# Patient Record
Sex: Female | Born: 1935 | Race: White | Hispanic: No | Smoking: Never smoker
Health system: Southern US, Community
[De-identification: ages and names within clinical notes are randomized; demographics above are authoritative.]

## PROBLEM LIST (undated history)

## (undated) DIAGNOSIS — I872 Venous insufficiency (chronic) (peripheral): Secondary | ICD-10-CM

## (undated) DIAGNOSIS — G894 Chronic pain syndrome: Secondary | ICD-10-CM

## (undated) DIAGNOSIS — K5730 Diverticulosis of large intestine without perforation or abscess without bleeding: Secondary | ICD-10-CM

## (undated) DIAGNOSIS — Q2111 Secundum atrial septal defect: Secondary | ICD-10-CM

## (undated) DIAGNOSIS — N3281 Overactive bladder: Secondary | ICD-10-CM

## (undated) DIAGNOSIS — I2510 Atherosclerotic heart disease of native coronary artery without angina pectoris: Secondary | ICD-10-CM

## (undated) DIAGNOSIS — D649 Anemia, unspecified: Secondary | ICD-10-CM

## (undated) DIAGNOSIS — E7800 Pure hypercholesterolemia, unspecified: Secondary | ICD-10-CM

## (undated) DIAGNOSIS — M545 Low back pain, unspecified: Secondary | ICD-10-CM

## (undated) DIAGNOSIS — M1990 Unspecified osteoarthritis, unspecified site: Secondary | ICD-10-CM

## (undated) DIAGNOSIS — I4430 Unspecified atrioventricular block: Secondary | ICD-10-CM

## (undated) DIAGNOSIS — Q211 Atrial septal defect: Secondary | ICD-10-CM

## (undated) HISTORY — DX: Atherosclerotic heart disease of native coronary artery without angina pectoris: I25.10

## (undated) HISTORY — PX: SHOULDER SURGERY: SHX246

## (undated) HISTORY — DX: Secundum atrial septal defect: Q21.11

## (undated) HISTORY — PX: ASD REPAIR: SHX258

## (undated) HISTORY — DX: Low back pain, unspecified: M54.50

## (undated) HISTORY — DX: Venous insufficiency (chronic) (peripheral): I87.2

## (undated) HISTORY — DX: Low back pain: M54.5

## (undated) HISTORY — PX: CATARACT EXTRACTION: SUR2

## (undated) HISTORY — DX: Atrial septal defect: Q21.1

## (undated) HISTORY — DX: Pure hypercholesterolemia, unspecified: E78.00

## (undated) HISTORY — PX: GANGLION CYST EXCISION: SHX1691

## (undated) HISTORY — DX: Diverticulosis of large intestine without perforation or abscess without bleeding: K57.30

## (undated) HISTORY — DX: Unspecified osteoarthritis, unspecified site: M19.90

## (undated) HISTORY — DX: Unspecified atrioventricular block: I44.30

## (undated) HISTORY — DX: Chronic pain syndrome: G89.4

## (undated) HISTORY — PX: PARTIAL HYSTERECTOMY: SHX80

## (undated) HISTORY — DX: Overactive bladder: N32.81

## (undated) HISTORY — DX: Anemia, unspecified: D64.9

---

## 1971-11-13 HISTORY — PX: VESICOVAGINAL FISTULA CLOSURE W/ TAH: SUR271

## 1994-11-12 HISTORY — PX: PACEMAKER PLACEMENT: SHX43

## 1998-02-21 ENCOUNTER — Other Ambulatory Visit: Admission: RE | Admit: 1998-02-21 | Discharge: 1998-02-21 | Payer: Self-pay | Admitting: Obstetrics and Gynecology

## 1999-05-03 ENCOUNTER — Other Ambulatory Visit: Admission: RE | Admit: 1999-05-03 | Discharge: 1999-05-03 | Payer: Self-pay | Admitting: Obstetrics and Gynecology

## 2000-03-15 ENCOUNTER — Encounter: Payer: Self-pay | Admitting: Obstetrics and Gynecology

## 2000-03-15 ENCOUNTER — Encounter: Admission: RE | Admit: 2000-03-15 | Discharge: 2000-03-15 | Payer: Self-pay | Admitting: Obstetrics and Gynecology

## 2000-07-03 ENCOUNTER — Other Ambulatory Visit: Admission: RE | Admit: 2000-07-03 | Discharge: 2000-07-03 | Payer: Self-pay | Admitting: Obstetrics and Gynecology

## 2001-03-12 ENCOUNTER — Ambulatory Visit (HOSPITAL_COMMUNITY): Admission: RE | Admit: 2001-03-12 | Discharge: 2001-03-12 | Payer: Self-pay | Admitting: Obstetrics and Gynecology

## 2001-05-23 ENCOUNTER — Encounter: Payer: Self-pay | Admitting: Obstetrics and Gynecology

## 2001-05-23 ENCOUNTER — Encounter: Admission: RE | Admit: 2001-05-23 | Discharge: 2001-05-23 | Payer: Self-pay | Admitting: Obstetrics and Gynecology

## 2001-08-18 ENCOUNTER — Other Ambulatory Visit: Admission: RE | Admit: 2001-08-18 | Discharge: 2001-08-18 | Payer: Self-pay | Admitting: Obstetrics and Gynecology

## 2001-09-04 ENCOUNTER — Encounter: Admission: RE | Admit: 2001-09-04 | Discharge: 2001-09-04 | Payer: Self-pay | Admitting: Obstetrics and Gynecology

## 2001-09-04 ENCOUNTER — Encounter: Payer: Self-pay | Admitting: Obstetrics and Gynecology

## 2002-06-11 ENCOUNTER — Encounter: Admission: RE | Admit: 2002-06-11 | Discharge: 2002-06-11 | Payer: Self-pay | Admitting: Obstetrics and Gynecology

## 2002-06-11 ENCOUNTER — Encounter: Payer: Self-pay | Admitting: Obstetrics and Gynecology

## 2003-10-14 ENCOUNTER — Encounter: Admission: RE | Admit: 2003-10-14 | Discharge: 2003-10-14 | Payer: Self-pay | Admitting: Obstetrics and Gynecology

## 2004-01-28 ENCOUNTER — Ambulatory Visit (HOSPITAL_COMMUNITY): Admission: RE | Admit: 2004-01-28 | Discharge: 2004-01-28 | Payer: Self-pay | Admitting: Internal Medicine

## 2004-09-18 ENCOUNTER — Ambulatory Visit: Payer: Self-pay | Admitting: Pulmonary Disease

## 2004-09-25 ENCOUNTER — Ambulatory Visit: Payer: Self-pay | Admitting: Cardiology

## 2004-09-27 ENCOUNTER — Ambulatory Visit: Payer: Self-pay | Admitting: Pulmonary Disease

## 2004-10-16 ENCOUNTER — Ambulatory Visit: Payer: Self-pay | Admitting: Cardiology

## 2004-10-27 ENCOUNTER — Encounter: Admission: RE | Admit: 2004-10-27 | Discharge: 2004-10-27 | Payer: Self-pay | Admitting: Obstetrics and Gynecology

## 2004-11-07 ENCOUNTER — Ambulatory Visit: Payer: Self-pay | Admitting: Cardiology

## 2004-12-14 ENCOUNTER — Encounter
Admission: RE | Admit: 2004-12-14 | Discharge: 2004-12-14 | Payer: Self-pay | Admitting: Physical Medicine and Rehabilitation

## 2004-12-20 ENCOUNTER — Ambulatory Visit: Payer: Self-pay | Admitting: Internal Medicine

## 2004-12-29 ENCOUNTER — Ambulatory Visit: Payer: Self-pay | Admitting: Internal Medicine

## 2005-01-15 ENCOUNTER — Ambulatory Visit: Payer: Self-pay | Admitting: Cardiovascular Disease

## 2005-01-18 ENCOUNTER — Ambulatory Visit: Payer: Self-pay

## 2005-01-19 ENCOUNTER — Ambulatory Visit: Payer: Self-pay | Admitting: Pulmonary Disease

## 2005-01-22 ENCOUNTER — Ambulatory Visit: Payer: Self-pay | Admitting: Cardiology

## 2005-01-23 ENCOUNTER — Ambulatory Visit: Payer: Self-pay

## 2005-01-31 ENCOUNTER — Ambulatory Visit: Payer: Self-pay | Admitting: Cardiology

## 2005-02-14 ENCOUNTER — Ambulatory Visit: Payer: Self-pay | Admitting: *Deleted

## 2005-03-09 ENCOUNTER — Ambulatory Visit: Payer: Self-pay | Admitting: Critical Care Medicine

## 2005-03-14 ENCOUNTER — Ambulatory Visit: Payer: Self-pay | Admitting: Internal Medicine

## 2005-03-14 ENCOUNTER — Ambulatory Visit: Payer: Self-pay

## 2005-03-14 ENCOUNTER — Ambulatory Visit: Payer: Self-pay | Admitting: Cardiology

## 2005-03-23 ENCOUNTER — Ambulatory Visit: Payer: Self-pay | Admitting: Pulmonary Disease

## 2005-03-30 ENCOUNTER — Ambulatory Visit: Payer: Self-pay | Admitting: Cardiovascular Disease

## 2005-04-06 ENCOUNTER — Ambulatory Visit: Payer: Self-pay | Admitting: Internal Medicine

## 2005-04-06 ENCOUNTER — Ambulatory Visit: Payer: Self-pay | Admitting: Cardiology

## 2005-04-23 ENCOUNTER — Ambulatory Visit: Payer: Self-pay | Admitting: Cardiology

## 2005-05-07 ENCOUNTER — Ambulatory Visit: Payer: Self-pay | Admitting: Cardiology

## 2005-05-18 ENCOUNTER — Ambulatory Visit: Payer: Self-pay | Admitting: Pulmonary Disease

## 2005-05-21 ENCOUNTER — Ambulatory Visit: Payer: Self-pay | Admitting: Cardiology

## 2005-06-18 ENCOUNTER — Ambulatory Visit: Payer: Self-pay | Admitting: Cardiology

## 2005-06-22 ENCOUNTER — Ambulatory Visit: Payer: Self-pay | Admitting: Internal Medicine

## 2005-07-02 ENCOUNTER — Ambulatory Visit: Payer: Self-pay | Admitting: Cardiology

## 2005-07-12 ENCOUNTER — Ambulatory Visit: Payer: Self-pay | Admitting: Internal Medicine

## 2005-07-26 ENCOUNTER — Ambulatory Visit: Payer: Self-pay | Admitting: Cardiology

## 2005-08-09 ENCOUNTER — Ambulatory Visit: Payer: Self-pay | Admitting: Cardiology

## 2005-08-17 ENCOUNTER — Ambulatory Visit: Payer: Self-pay | Admitting: Pulmonary Disease

## 2005-08-22 ENCOUNTER — Ambulatory Visit: Payer: Self-pay | Admitting: Pulmonary Disease

## 2005-08-30 ENCOUNTER — Ambulatory Visit: Payer: Self-pay | Admitting: Cardiology

## 2005-09-26 ENCOUNTER — Ambulatory Visit: Payer: Self-pay | Admitting: Internal Medicine

## 2005-09-27 ENCOUNTER — Ambulatory Visit: Payer: Self-pay | Admitting: Internal Medicine

## 2005-10-11 ENCOUNTER — Ambulatory Visit: Payer: Self-pay | Admitting: Cardiology

## 2005-10-30 ENCOUNTER — Encounter: Admission: RE | Admit: 2005-10-30 | Discharge: 2005-10-30 | Payer: Self-pay | Admitting: Obstetrics and Gynecology

## 2005-11-01 ENCOUNTER — Ambulatory Visit: Payer: Self-pay | Admitting: Cardiology

## 2005-11-15 ENCOUNTER — Ambulatory Visit: Payer: Self-pay | Admitting: Internal Medicine

## 2005-12-07 ENCOUNTER — Ambulatory Visit: Payer: Self-pay | Admitting: Cardiology

## 2005-12-07 ENCOUNTER — Ambulatory Visit: Payer: Self-pay | Admitting: Internal Medicine

## 2005-12-18 ENCOUNTER — Ambulatory Visit: Payer: Self-pay | Admitting: Pulmonary Disease

## 2006-01-04 ENCOUNTER — Ambulatory Visit: Payer: Self-pay | Admitting: Cardiology

## 2006-01-16 ENCOUNTER — Ambulatory Visit: Payer: Self-pay | Admitting: Internal Medicine

## 2006-02-01 ENCOUNTER — Ambulatory Visit: Payer: Self-pay | Admitting: Cardiovascular Disease

## 2006-02-12 ENCOUNTER — Ambulatory Visit: Payer: Self-pay | Admitting: Cardiology

## 2006-02-19 ENCOUNTER — Ambulatory Visit: Payer: Self-pay | Admitting: Internal Medicine

## 2006-02-28 ENCOUNTER — Ambulatory Visit: Payer: Self-pay | Admitting: Internal Medicine

## 2006-03-06 ENCOUNTER — Ambulatory Visit: Payer: Self-pay | Admitting: Internal Medicine

## 2006-03-28 ENCOUNTER — Ambulatory Visit: Payer: Self-pay | Admitting: Cardiology

## 2006-04-17 ENCOUNTER — Ambulatory Visit: Payer: Self-pay | Admitting: Internal Medicine

## 2006-04-18 ENCOUNTER — Ambulatory Visit: Payer: Self-pay | Admitting: Internal Medicine

## 2006-04-18 ENCOUNTER — Ambulatory Visit: Payer: Self-pay | Admitting: Pulmonary Disease

## 2006-04-25 ENCOUNTER — Ambulatory Visit: Payer: Self-pay | Admitting: Cardiology

## 2006-05-01 ENCOUNTER — Ambulatory Visit: Payer: Self-pay | Admitting: *Deleted

## 2006-05-22 ENCOUNTER — Ambulatory Visit: Payer: Self-pay | Admitting: Internal Medicine

## 2006-06-17 ENCOUNTER — Ambulatory Visit: Payer: Self-pay | Admitting: Internal Medicine

## 2006-07-01 ENCOUNTER — Ambulatory Visit: Payer: Self-pay | Admitting: Internal Medicine

## 2006-07-17 ENCOUNTER — Ambulatory Visit: Payer: Self-pay | Admitting: *Deleted

## 2006-07-17 ENCOUNTER — Ambulatory Visit: Payer: Self-pay | Admitting: Internal Medicine

## 2006-08-08 ENCOUNTER — Ambulatory Visit: Payer: Self-pay | Admitting: Cardiology

## 2006-09-05 ENCOUNTER — Ambulatory Visit: Payer: Self-pay | Admitting: Cardiology

## 2006-09-18 ENCOUNTER — Ambulatory Visit: Payer: Self-pay | Admitting: Pulmonary Disease

## 2006-10-07 ENCOUNTER — Ambulatory Visit: Payer: Self-pay | Admitting: Cardiology

## 2006-10-17 ENCOUNTER — Ambulatory Visit: Payer: Self-pay | Admitting: Internal Medicine

## 2006-10-22 ENCOUNTER — Ambulatory Visit: Payer: Self-pay | Admitting: Internal Medicine

## 2006-11-06 ENCOUNTER — Ambulatory Visit: Payer: Self-pay | Admitting: Cardiology

## 2006-11-11 ENCOUNTER — Ambulatory Visit: Payer: Self-pay | Admitting: Pulmonary Disease

## 2006-11-11 ENCOUNTER — Ambulatory Visit: Payer: Self-pay | Admitting: Cardiovascular Disease

## 2006-11-18 ENCOUNTER — Ambulatory Visit: Payer: Self-pay | Admitting: Cardiology

## 2006-11-18 ENCOUNTER — Ambulatory Visit: Payer: Self-pay | Admitting: Internal Medicine

## 2006-12-02 ENCOUNTER — Ambulatory Visit: Payer: Self-pay | Admitting: Cardiology

## 2006-12-12 ENCOUNTER — Ambulatory Visit: Payer: Self-pay | Admitting: Internal Medicine

## 2006-12-16 ENCOUNTER — Ambulatory Visit: Payer: Self-pay | Admitting: Cardiology

## 2006-12-27 ENCOUNTER — Encounter: Admission: RE | Admit: 2006-12-27 | Discharge: 2006-12-27 | Payer: Self-pay | Admitting: Obstetrics and Gynecology

## 2006-12-30 ENCOUNTER — Ambulatory Visit: Payer: Self-pay | Admitting: Internal Medicine

## 2006-12-30 LAB — CONVERTED CEMR LAB
BUN: 21 mg/dL (ref 6–23)
Basophils Absolute: 0 10*3/uL (ref 0.0–0.1)
Basophils Relative: 0.3 % (ref 0.0–1.0)
CO2: 29 meq/L (ref 19–32)
Chloride: 102 meq/L (ref 96–112)
Creatinine, Ser: 1.1 mg/dL (ref 0.4–1.2)
HCT: 35 % — ABNORMAL LOW (ref 36.0–46.0)
Hemoglobin: 11.5 g/dL — ABNORMAL LOW (ref 12.0–15.0)
INR: 1.6 (ref 0.9–2.0)
MCHC: 33 g/dL (ref 30.0–36.0)
Monocytes Absolute: 0.6 10*3/uL (ref 0.2–0.7)
Monocytes Relative: 11.1 % — ABNORMAL HIGH (ref 3.0–11.0)
Neutrophils Relative %: 61.4 % (ref 43.0–77.0)
Potassium: 4.2 meq/L (ref 3.5–5.1)
Prothrombin Time: 16 s — ABNORMAL HIGH (ref 10.0–14.0)
RBC: 3.46 M/uL — ABNORMAL LOW (ref 3.87–5.11)
RDW: 12.3 % (ref 11.5–14.6)
aPTT: 34.2 s (ref 26.5–36.5)

## 2007-01-06 ENCOUNTER — Ambulatory Visit: Payer: Self-pay | Admitting: Internal Medicine

## 2007-01-06 ENCOUNTER — Inpatient Hospital Stay (HOSPITAL_COMMUNITY): Admission: AD | Admit: 2007-01-06 | Discharge: 2007-01-07 | Payer: Self-pay | Admitting: Internal Medicine

## 2007-01-13 ENCOUNTER — Ambulatory Visit: Payer: Self-pay

## 2007-01-13 ENCOUNTER — Ambulatory Visit: Payer: Self-pay | Admitting: Cardiovascular Disease

## 2007-01-23 ENCOUNTER — Ambulatory Visit: Payer: Self-pay | Admitting: Cardiology

## 2007-01-30 ENCOUNTER — Ambulatory Visit: Payer: Self-pay

## 2007-02-06 ENCOUNTER — Ambulatory Visit: Payer: Self-pay | Admitting: Cardiology

## 2007-02-13 ENCOUNTER — Ambulatory Visit: Payer: Self-pay | Admitting: Cardiology

## 2007-02-27 ENCOUNTER — Ambulatory Visit: Payer: Self-pay | Admitting: Internal Medicine

## 2007-03-13 ENCOUNTER — Ambulatory Visit: Payer: Self-pay | Admitting: Cardiovascular Disease

## 2007-03-27 ENCOUNTER — Ambulatory Visit: Payer: Self-pay | Admitting: Internal Medicine

## 2007-03-31 ENCOUNTER — Ambulatory Visit: Payer: Self-pay | Admitting: Pulmonary Disease

## 2007-04-01 ENCOUNTER — Ambulatory Visit: Payer: Self-pay | Admitting: Pulmonary Disease

## 2007-04-01 LAB — CONVERTED CEMR LAB
Bilirubin, Direct: 0.1 mg/dL (ref 0.0–0.3)
CO2: 27 meq/L (ref 19–32)
Cholesterol: 91 mg/dL (ref 0–200)
GFR calc Af Amer: 68 mL/min
GFR calc non Af Amer: 56 mL/min
Glucose, Bld: 84 mg/dL (ref 70–99)
LDL Cholesterol: 41 mg/dL (ref 0–99)
Potassium: 4.1 meq/L (ref 3.5–5.1)
Sodium: 141 meq/L (ref 135–145)
Total CHOL/HDL Ratio: 2.2
Total Protein: 6.4 g/dL (ref 6.0–8.3)
Triglycerides: 47 mg/dL (ref 0–149)

## 2007-04-03 ENCOUNTER — Ambulatory Visit: Payer: Self-pay | Admitting: Internal Medicine

## 2007-04-17 ENCOUNTER — Ambulatory Visit: Payer: Self-pay | Admitting: Cardiovascular Disease

## 2007-04-28 ENCOUNTER — Ambulatory Visit: Payer: Self-pay | Admitting: Pulmonary Disease

## 2007-05-02 ENCOUNTER — Ambulatory Visit: Payer: Self-pay | Admitting: Cardiovascular Disease

## 2007-05-08 ENCOUNTER — Ambulatory Visit: Payer: Self-pay | Admitting: Internal Medicine

## 2007-05-23 ENCOUNTER — Ambulatory Visit: Payer: Self-pay | Admitting: Cardiology

## 2007-06-05 ENCOUNTER — Ambulatory Visit: Payer: Self-pay | Admitting: Internal Medicine

## 2007-06-13 ENCOUNTER — Ambulatory Visit: Payer: Self-pay | Admitting: Internal Medicine

## 2007-06-17 ENCOUNTER — Ambulatory Visit: Payer: Self-pay | Admitting: Internal Medicine

## 2007-07-03 ENCOUNTER — Ambulatory Visit: Payer: Self-pay | Admitting: Internal Medicine

## 2007-07-11 ENCOUNTER — Ambulatory Visit: Payer: Self-pay | Admitting: Internal Medicine

## 2007-07-31 ENCOUNTER — Ambulatory Visit: Payer: Self-pay | Admitting: Internal Medicine

## 2007-08-08 ENCOUNTER — Ambulatory Visit: Payer: Self-pay | Admitting: Cardiovascular Disease

## 2007-08-25 ENCOUNTER — Encounter: Admission: RE | Admit: 2007-08-25 | Discharge: 2007-08-25 | Payer: Self-pay | Admitting: Orthopedic Surgery

## 2007-08-26 ENCOUNTER — Encounter (INDEPENDENT_AMBULATORY_CARE_PROVIDER_SITE_OTHER): Payer: Self-pay | Admitting: Orthopedic Surgery

## 2007-08-26 ENCOUNTER — Ambulatory Visit (HOSPITAL_BASED_OUTPATIENT_CLINIC_OR_DEPARTMENT_OTHER): Admission: RE | Admit: 2007-08-26 | Discharge: 2007-08-26 | Payer: Self-pay | Admitting: Orthopedic Surgery

## 2007-08-27 ENCOUNTER — Ambulatory Visit: Payer: Self-pay | Admitting: Internal Medicine

## 2007-09-04 ENCOUNTER — Ambulatory Visit: Payer: Self-pay | Admitting: Pulmonary Disease

## 2007-09-04 DIAGNOSIS — M545 Low back pain: Secondary | ICD-10-CM

## 2007-09-04 DIAGNOSIS — E7800 Pure hypercholesterolemia, unspecified: Secondary | ICD-10-CM | POA: Insufficient documentation

## 2007-09-04 DIAGNOSIS — J989 Respiratory disorder, unspecified: Secondary | ICD-10-CM | POA: Insufficient documentation

## 2007-09-04 DIAGNOSIS — I872 Venous insufficiency (chronic) (peripheral): Secondary | ICD-10-CM | POA: Insufficient documentation

## 2007-09-04 LAB — CONVERTED CEMR LAB
Cholesterol: 139 mg/dL (ref 0–200)
HDL: 57.6 mg/dL (ref >39.0)
Total CHOL/HDL Ratio: 2.4
Triglycerides: 58 mg/dL (ref 0–149)

## 2007-09-05 ENCOUNTER — Ambulatory Visit: Payer: Self-pay | Admitting: Internal Medicine

## 2007-09-05 ENCOUNTER — Ambulatory Visit: Payer: Self-pay | Admitting: Cardiovascular Disease

## 2007-09-11 ENCOUNTER — Ambulatory Visit: Payer: Self-pay | Admitting: Internal Medicine

## 2007-09-19 ENCOUNTER — Ambulatory Visit: Payer: Self-pay | Admitting: Internal Medicine

## 2007-09-19 ENCOUNTER — Inpatient Hospital Stay (HOSPITAL_COMMUNITY): Admission: AD | Admit: 2007-09-19 | Discharge: 2007-09-20 | Payer: Self-pay | Admitting: Internal Medicine

## 2007-09-24 ENCOUNTER — Ambulatory Visit: Payer: Self-pay | Admitting: Cardiology

## 2007-09-24 ENCOUNTER — Ambulatory Visit: Payer: Self-pay

## 2007-10-01 ENCOUNTER — Ambulatory Visit: Payer: Self-pay

## 2007-10-06 ENCOUNTER — Ambulatory Visit: Payer: Self-pay | Admitting: Cardiology

## 2007-10-23 ENCOUNTER — Ambulatory Visit: Payer: Self-pay | Admitting: Internal Medicine

## 2007-10-23 ENCOUNTER — Ambulatory Visit: Payer: Self-pay | Admitting: Cardiology

## 2007-10-23 LAB — CONVERTED CEMR LAB
Basophils Absolute: 0 10*3/uL (ref 0.0–0.1)
Eosinophils Absolute: 0.3 10*3/uL (ref 0.0–0.6)
Eosinophils Relative: 3.9 % (ref 0.0–5.0)
Lymphocytes Relative: 25.7 % (ref 12.0–46.0)
MCHC: 33.8 g/dL (ref 30.0–36.0)
MCV: 104.2 fL — ABNORMAL HIGH (ref 78.0–100.0)
Monocytes Relative: 4.6 % (ref 3.0–11.0)
Neutro Abs: 4.6 10*3/uL (ref 1.4–7.7)
Platelets: 208 10*3/uL (ref 150–400)
TSH: 0.39 microintl units/mL (ref 0.35–5.50)
WBC: 7 10*3/uL (ref 4.5–10.5)

## 2007-11-10 ENCOUNTER — Ambulatory Visit: Payer: Self-pay | Admitting: Cardiology

## 2007-11-20 ENCOUNTER — Ambulatory Visit: Payer: Self-pay | Admitting: Internal Medicine

## 2007-11-24 ENCOUNTER — Ambulatory Visit: Payer: Self-pay | Admitting: Cardiology

## 2007-12-15 ENCOUNTER — Ambulatory Visit: Payer: Self-pay | Admitting: Internal Medicine

## 2008-01-14 ENCOUNTER — Encounter: Admission: RE | Admit: 2008-01-14 | Discharge: 2008-01-14 | Payer: Self-pay | Admitting: Obstetrics and Gynecology

## 2008-01-14 ENCOUNTER — Ambulatory Visit: Payer: Self-pay | Admitting: Cardiology

## 2008-02-11 ENCOUNTER — Ambulatory Visit: Payer: Self-pay | Admitting: Internal Medicine

## 2008-02-25 ENCOUNTER — Ambulatory Visit: Payer: Self-pay | Admitting: Internal Medicine

## 2008-03-03 ENCOUNTER — Ambulatory Visit: Payer: Self-pay | Admitting: Cardiology

## 2008-03-31 ENCOUNTER — Ambulatory Visit: Payer: Self-pay | Admitting: Cardiology

## 2008-04-13 ENCOUNTER — Ambulatory Visit: Payer: Self-pay | Admitting: Internal Medicine

## 2008-04-21 ENCOUNTER — Ambulatory Visit: Payer: Self-pay | Admitting: Cardiology

## 2008-05-21 ENCOUNTER — Ambulatory Visit: Payer: Self-pay | Admitting: Cardiovascular Disease

## 2008-06-18 ENCOUNTER — Ambulatory Visit: Payer: Self-pay | Admitting: Cardiology

## 2008-07-15 ENCOUNTER — Ambulatory Visit: Payer: Self-pay | Admitting: Pulmonary Disease

## 2008-07-15 ENCOUNTER — Ambulatory Visit: Payer: Self-pay | Admitting: Internal Medicine

## 2008-07-15 DIAGNOSIS — M810 Age-related osteoporosis without current pathological fracture: Secondary | ICD-10-CM

## 2008-07-15 DIAGNOSIS — M1990 Unspecified osteoarthritis, unspecified site: Secondary | ICD-10-CM | POA: Insufficient documentation

## 2008-07-16 ENCOUNTER — Encounter: Payer: Self-pay | Admitting: Pulmonary Disease

## 2008-07-16 ENCOUNTER — Ambulatory Visit: Payer: Self-pay | Admitting: Cardiovascular Disease

## 2008-07-16 ENCOUNTER — Ambulatory Visit: Payer: Self-pay

## 2008-07-17 DIAGNOSIS — K5730 Diverticulosis of large intestine without perforation or abscess without bleeding: Secondary | ICD-10-CM | POA: Insufficient documentation

## 2008-07-17 DIAGNOSIS — N318 Other neuromuscular dysfunction of bladder: Secondary | ICD-10-CM | POA: Insufficient documentation

## 2008-07-17 LAB — CONVERTED CEMR LAB
ALT: 17 units/L (ref 0–35)
AST: 58 units/L — ABNORMAL HIGH (ref 0–37)
Albumin: 4.1 g/dL (ref 3.5–5.2)
BUN: 17 mg/dL (ref 6–23)
Basophils Relative: 0.7 % (ref 0.0–3.0)
CO2: 33 meq/L — ABNORMAL HIGH (ref 19–32)
Chloride: 106 meq/L (ref 96–112)
Creatinine, Ser: 0.8 mg/dL (ref 0.4–1.2)
Eosinophils Relative: 1.7 % (ref 0.0–5.0)
Hemoglobin: 12.5 g/dL (ref 12.0–15.0)
LDL Cholesterol: 104 mg/dL — ABNORMAL HIGH (ref 0–99)
Lymphocytes Relative: 36.2 % (ref 12.0–46.0)
MCV: 100.7 fL — ABNORMAL HIGH (ref 78.0–100.0)
Neutrophils Relative %: 54 % (ref 43.0–77.0)
RBC: 3.54 M/uL — ABNORMAL LOW (ref 3.87–5.11)
TSH: 0.59 microintl units/mL (ref 0.35–5.50)
Total Protein: 6.9 g/dL (ref 6.0–8.3)
VLDL: 8 mg/dL (ref 0–40)
WBC: 4.9 10*3/uL (ref 4.5–10.5)

## 2008-08-13 ENCOUNTER — Ambulatory Visit: Payer: Self-pay | Admitting: Cardiovascular Disease

## 2008-08-18 ENCOUNTER — Ambulatory Visit: Payer: Self-pay | Admitting: Pulmonary Disease

## 2008-09-10 ENCOUNTER — Ambulatory Visit: Payer: Self-pay | Admitting: Internal Medicine

## 2008-10-01 ENCOUNTER — Ambulatory Visit: Payer: Self-pay | Admitting: Cardiovascular Disease

## 2008-10-14 ENCOUNTER — Ambulatory Visit: Payer: Self-pay | Admitting: Internal Medicine

## 2008-10-29 ENCOUNTER — Ambulatory Visit: Payer: Self-pay

## 2008-11-03 ENCOUNTER — Ambulatory Visit: Payer: Self-pay | Admitting: Internal Medicine

## 2008-11-03 DIAGNOSIS — I442 Atrioventricular block, complete: Secondary | ICD-10-CM

## 2008-11-09 ENCOUNTER — Encounter: Payer: Self-pay | Admitting: Internal Medicine

## 2008-11-09 LAB — CONVERTED CEMR LAB
BUN: 21 mg/dL (ref 6–23)
Eosinophils Absolute: 0.2 10*3/uL (ref 0.0–0.7)
Eosinophils Relative: 3.8 % (ref 0.0–5.0)
GFR calc Af Amer: 76 mL/min
GFR calc non Af Amer: 63 mL/min
HCT: 33.3 % — ABNORMAL LOW (ref 36.0–46.0)
MCV: 101.3 fL — ABNORMAL HIGH (ref 78.0–100.0)
Monocytes Absolute: 0.5 10*3/uL (ref 0.1–1.0)
Platelets: 160 10*3/uL (ref 150–400)
Potassium: 4.3 meq/L (ref 3.5–5.1)
RDW: 12.7 % (ref 11.5–14.6)
Sodium: 141 meq/L (ref 135–145)

## 2008-11-11 ENCOUNTER — Ambulatory Visit: Payer: Self-pay | Admitting: Internal Medicine

## 2008-11-11 ENCOUNTER — Ambulatory Visit (HOSPITAL_COMMUNITY): Admission: RE | Admit: 2008-11-11 | Discharge: 2008-11-11 | Payer: Self-pay | Admitting: Internal Medicine

## 2008-11-15 ENCOUNTER — Ambulatory Visit: Payer: Self-pay | Admitting: Internal Medicine

## 2008-11-25 ENCOUNTER — Ambulatory Visit: Payer: Self-pay | Admitting: Cardiovascular Disease

## 2008-11-25 ENCOUNTER — Ambulatory Visit: Payer: Self-pay | Admitting: Internal Medicine

## 2008-12-24 ENCOUNTER — Ambulatory Visit: Payer: Self-pay | Admitting: Internal Medicine

## 2009-01-12 ENCOUNTER — Ambulatory Visit: Payer: Self-pay | Admitting: Pulmonary Disease

## 2009-01-13 ENCOUNTER — Ambulatory Visit: Payer: Self-pay | Admitting: Internal Medicine

## 2009-01-13 ENCOUNTER — Ambulatory Visit: Payer: Self-pay | Admitting: Pulmonary Disease

## 2009-01-15 DIAGNOSIS — Q211 Atrial septal defect: Secondary | ICD-10-CM

## 2009-01-15 DIAGNOSIS — I2510 Atherosclerotic heart disease of native coronary artery without angina pectoris: Secondary | ICD-10-CM

## 2009-01-15 LAB — CONVERTED CEMR LAB
AST: 60 units/L — ABNORMAL HIGH (ref 0–37)
Alkaline Phosphatase: 38 units/L — ABNORMAL LOW (ref 39–117)
Basophils Absolute: 0 10*3/uL (ref 0.0–0.1)
Bilirubin, Direct: 0.1 mg/dL (ref 0.0–0.3)
Chloride: 100 meq/L (ref 96–112)
Eosinophils Absolute: 0.1 10*3/uL (ref 0.0–0.7)
GFR calc non Af Amer: 63 mL/min
HDL: 57.4 mg/dL (ref >39.0)
LDL Cholesterol: 129 mg/dL — ABNORMAL HIGH (ref 0–99)
MCHC: 33.9 g/dL (ref 30.0–36.0)
MCV: 101.1 fL — ABNORMAL HIGH (ref 78.0–100.0)
Neutrophils Relative %: 47.9 % (ref 43.0–77.0)
Platelets: 159 10*3/uL (ref 150–400)
Potassium: 4 meq/L (ref 3.5–5.1)
Total Bilirubin: 0.8 mg/dL (ref 0.3–1.2)
VLDL: 10 mg/dL (ref 0–40)
WBC: 4.3 10*3/uL — ABNORMAL LOW (ref 4.5–10.5)

## 2009-01-21 ENCOUNTER — Ambulatory Visit: Payer: Self-pay | Admitting: Cardiovascular Disease

## 2009-01-27 ENCOUNTER — Encounter: Admission: RE | Admit: 2009-01-27 | Discharge: 2009-01-27 | Payer: Self-pay | Admitting: Obstetrics and Gynecology

## 2009-02-18 ENCOUNTER — Ambulatory Visit: Payer: Self-pay | Admitting: Cardiology

## 2009-03-18 ENCOUNTER — Ambulatory Visit: Payer: Self-pay | Admitting: Cardiovascular Disease

## 2009-03-28 ENCOUNTER — Telehealth (INDEPENDENT_AMBULATORY_CARE_PROVIDER_SITE_OTHER): Payer: Self-pay | Admitting: *Deleted

## 2009-04-12 ENCOUNTER — Encounter: Payer: Self-pay | Admitting: *Deleted

## 2009-04-15 ENCOUNTER — Ambulatory Visit: Payer: Self-pay | Admitting: Cardiology

## 2009-04-15 LAB — CONVERTED CEMR LAB: POC INR: 2.2

## 2009-04-26 ENCOUNTER — Encounter: Payer: Self-pay | Admitting: Pulmonary Disease

## 2009-04-26 ENCOUNTER — Ambulatory Visit: Payer: Self-pay | Admitting: Internal Medicine

## 2009-04-26 ENCOUNTER — Telehealth: Payer: Self-pay | Admitting: Internal Medicine

## 2009-04-27 ENCOUNTER — Telehealth: Payer: Self-pay | Admitting: Internal Medicine

## 2009-04-27 LAB — CONVERTED CEMR LAB
CO2: 31 meq/L (ref 19–32)
Calcium: 10.1 mg/dL (ref 8.4–10.5)
Chloride: 101 meq/L (ref 96–112)
Eosinophils Relative: 1.6 % (ref 0.0–5.0)
HCT: 36 % (ref 36.0–46.0)
Hemoglobin: 12.6 g/dL (ref 12.0–15.0)
Lymphs Abs: 1.8 10*3/uL (ref 0.7–4.0)
Monocytes Relative: 10.2 % (ref 3.0–12.0)
Platelets: 193 10*3/uL (ref 150.0–400.0)
Sodium: 139 meq/L (ref 135–145)
TSH: 0.54 microintl units/mL (ref 0.35–5.50)
WBC: 7 10*3/uL (ref 4.5–10.5)

## 2009-04-28 ENCOUNTER — Telehealth: Payer: Self-pay | Admitting: Internal Medicine

## 2009-05-09 ENCOUNTER — Telehealth: Payer: Self-pay | Admitting: Internal Medicine

## 2009-05-17 ENCOUNTER — Encounter: Payer: Self-pay | Admitting: Internal Medicine

## 2009-05-17 ENCOUNTER — Encounter: Payer: Self-pay | Admitting: Pulmonary Disease

## 2009-05-17 ENCOUNTER — Encounter (INDEPENDENT_AMBULATORY_CARE_PROVIDER_SITE_OTHER): Payer: Self-pay | Admitting: Cardiology

## 2009-05-17 ENCOUNTER — Ambulatory Visit: Payer: Self-pay | Admitting: Cardiology

## 2009-05-17 LAB — CONVERTED CEMR LAB: Prothrombin Time: 11.5 s

## 2009-05-18 ENCOUNTER — Encounter: Payer: Self-pay | Admitting: *Deleted

## 2009-05-23 ENCOUNTER — Ambulatory Visit: Payer: Self-pay | Admitting: Cardiology

## 2009-05-23 LAB — CONVERTED CEMR LAB
POC INR: 2.1
Prothrombin Time: 17.9 s

## 2009-06-09 ENCOUNTER — Ambulatory Visit: Payer: Self-pay | Admitting: Pulmonary Disease

## 2009-06-10 LAB — CONVERTED CEMR LAB
CO2: 31 meq/L (ref 19–32)
Calcium: 10 mg/dL (ref 8.4–10.5)
Folate: 13.6 ng/mL
GFR calc non Af Amer: 55.56 mL/min (ref >60)
Sodium: 141 meq/L (ref 135–145)
Vitamin B-12: 369 pg/mL (ref 211–911)

## 2009-06-17 ENCOUNTER — Ambulatory Visit: Payer: Self-pay | Admitting: Cardiology

## 2009-06-17 LAB — CONVERTED CEMR LAB: Prothrombin Time: 16.5 s

## 2009-07-08 ENCOUNTER — Ambulatory Visit: Payer: Self-pay | Admitting: Cardiovascular Disease

## 2009-07-19 ENCOUNTER — Ambulatory Visit: Payer: Self-pay | Admitting: Internal Medicine

## 2009-07-20 ENCOUNTER — Ambulatory Visit: Payer: Self-pay

## 2009-07-20 ENCOUNTER — Encounter: Payer: Self-pay | Admitting: Pulmonary Disease

## 2009-07-21 ENCOUNTER — Encounter: Payer: Self-pay | Admitting: Pulmonary Disease

## 2009-07-25 ENCOUNTER — Ambulatory Visit: Payer: Self-pay | Admitting: Pulmonary Disease

## 2009-08-02 ENCOUNTER — Encounter: Payer: Self-pay | Admitting: Pulmonary Disease

## 2009-08-02 ENCOUNTER — Ambulatory Visit: Payer: Self-pay | Admitting: Internal Medicine

## 2009-08-05 ENCOUNTER — Ambulatory Visit: Payer: Self-pay | Admitting: Internal Medicine

## 2009-08-18 ENCOUNTER — Encounter: Payer: Self-pay | Admitting: Internal Medicine

## 2009-08-18 ENCOUNTER — Encounter: Payer: Self-pay | Admitting: Pulmonary Disease

## 2009-09-02 ENCOUNTER — Ambulatory Visit: Payer: Self-pay | Admitting: Cardiology

## 2009-09-19 ENCOUNTER — Ambulatory Visit: Payer: Self-pay | Admitting: Pulmonary Disease

## 2009-09-30 ENCOUNTER — Ambulatory Visit: Payer: Self-pay | Admitting: Cardiology

## 2009-10-14 ENCOUNTER — Encounter: Payer: Self-pay | Admitting: Internal Medicine

## 2009-10-18 ENCOUNTER — Ambulatory Visit: Payer: Self-pay | Admitting: Internal Medicine

## 2009-10-18 ENCOUNTER — Ambulatory Visit: Payer: Self-pay | Admitting: Cardiovascular Disease

## 2009-11-01 ENCOUNTER — Ambulatory Visit: Payer: Self-pay | Admitting: Internal Medicine

## 2009-11-01 LAB — CONVERTED CEMR LAB: POC INR: 3.2

## 2009-11-12 HISTORY — PX: INGUINAL HERNIA REPAIR: SUR1180

## 2009-11-14 ENCOUNTER — Telehealth: Payer: Self-pay | Admitting: Internal Medicine

## 2009-11-15 ENCOUNTER — Encounter: Payer: Self-pay | Admitting: Internal Medicine

## 2009-11-15 ENCOUNTER — Ambulatory Visit: Payer: Self-pay | Admitting: Internal Medicine

## 2009-11-18 ENCOUNTER — Ambulatory Visit: Payer: Self-pay | Admitting: Cardiology

## 2009-12-09 ENCOUNTER — Ambulatory Visit: Payer: Self-pay | Admitting: Internal Medicine

## 2009-12-09 LAB — CONVERTED CEMR LAB: POC INR: 1.9

## 2009-12-16 ENCOUNTER — Telehealth: Payer: Self-pay | Admitting: Internal Medicine

## 2009-12-19 ENCOUNTER — Telehealth: Payer: Self-pay | Admitting: Internal Medicine

## 2009-12-22 ENCOUNTER — Ambulatory Visit: Payer: Self-pay | Admitting: Cardiology

## 2009-12-22 LAB — CONVERTED CEMR LAB: POC INR: 1

## 2009-12-30 ENCOUNTER — Encounter (INDEPENDENT_AMBULATORY_CARE_PROVIDER_SITE_OTHER): Payer: Self-pay | Admitting: Cardiology

## 2009-12-30 ENCOUNTER — Ambulatory Visit: Payer: Self-pay | Admitting: Cardiology

## 2009-12-30 LAB — CONVERTED CEMR LAB: POC INR: 2.3

## 2010-01-13 ENCOUNTER — Ambulatory Visit: Payer: Self-pay | Admitting: Cardiology

## 2010-01-19 ENCOUNTER — Ambulatory Visit: Payer: Self-pay | Admitting: Internal Medicine

## 2010-01-23 ENCOUNTER — Ambulatory Visit: Payer: Self-pay | Admitting: Pulmonary Disease

## 2010-01-23 DIAGNOSIS — G894 Chronic pain syndrome: Secondary | ICD-10-CM

## 2010-01-27 ENCOUNTER — Ambulatory Visit: Payer: Self-pay | Admitting: Cardiovascular Disease

## 2010-01-27 LAB — CONVERTED CEMR LAB: POC INR: 3.3

## 2010-01-29 LAB — CONVERTED CEMR LAB
ALT: 13 units/L (ref 0–35)
AST: 50 units/L — ABNORMAL HIGH (ref 0–37)
Alkaline Phosphatase: 31 units/L — ABNORMAL LOW (ref 39–117)
Basophils Absolute: 0 10*3/uL (ref 0.0–0.1)
Basophils Relative: 0.3 % (ref 0.0–3.0)
Bilirubin, Direct: 0.1 mg/dL (ref 0.0–0.3)
CO2: 31 meq/L (ref 19–32)
Chloride: 104 meq/L (ref 96–112)
Creatinine, Ser: 0.7 mg/dL (ref 0.4–1.2)
Eosinophils Absolute: 0 10*3/uL (ref 0.0–0.7)
LDL Cholesterol: 105 mg/dL — ABNORMAL HIGH (ref 0–99)
Lymphocytes Relative: 22.8 % (ref 12.0–46.0)
MCHC: 32.8 g/dL (ref 30.0–36.0)
MCV: 106.2 fL — ABNORMAL HIGH (ref 78.0–100.0)
Monocytes Absolute: 0.4 10*3/uL (ref 0.1–1.0)
Neutrophils Relative %: 67.9 % (ref 43.0–77.0)
Platelets: 166 10*3/uL (ref 150.0–400.0)
Potassium: 4.6 meq/L (ref 3.5–5.1)
RBC: 3.45 M/uL — ABNORMAL LOW (ref 3.87–5.11)
RDW: 13.1 % (ref 11.5–14.6)
Sed Rate: 10 mm/hr (ref 0–22)
Total Bilirubin: 0.5 mg/dL (ref 0.3–1.2)
Total CHOL/HDL Ratio: 3
Total Protein: 7.1 g/dL (ref 6.0–8.3)
Triglycerides: 86 mg/dL (ref 0.0–149.0)

## 2010-02-10 ENCOUNTER — Ambulatory Visit: Payer: Self-pay | Admitting: Internal Medicine

## 2010-02-20 ENCOUNTER — Ambulatory Visit: Payer: Self-pay

## 2010-03-06 ENCOUNTER — Ambulatory Visit: Payer: Self-pay | Admitting: Cardiovascular Disease

## 2010-03-27 ENCOUNTER — Encounter: Payer: Self-pay | Admitting: Pulmonary Disease

## 2010-03-31 ENCOUNTER — Ambulatory Visit: Payer: Self-pay | Admitting: Internal Medicine

## 2010-03-31 LAB — CONVERTED CEMR LAB: POC INR: 2.4

## 2010-04-20 ENCOUNTER — Ambulatory Visit: Payer: Self-pay | Admitting: Internal Medicine

## 2010-04-28 ENCOUNTER — Ambulatory Visit: Payer: Self-pay | Admitting: Cardiology

## 2010-05-05 ENCOUNTER — Ambulatory Visit: Payer: Self-pay | Admitting: Pulmonary Disease

## 2010-05-05 ENCOUNTER — Telehealth: Payer: Self-pay | Admitting: Pulmonary Disease

## 2010-05-12 ENCOUNTER — Telehealth: Payer: Self-pay | Admitting: Internal Medicine

## 2010-05-12 ENCOUNTER — Encounter: Payer: Self-pay | Admitting: Pulmonary Disease

## 2010-05-16 ENCOUNTER — Encounter (INDEPENDENT_AMBULATORY_CARE_PROVIDER_SITE_OTHER): Payer: Self-pay | Admitting: *Deleted

## 2010-05-17 ENCOUNTER — Telehealth (INDEPENDENT_AMBULATORY_CARE_PROVIDER_SITE_OTHER): Payer: Self-pay | Admitting: *Deleted

## 2010-05-24 ENCOUNTER — Telehealth: Payer: Self-pay | Admitting: Internal Medicine

## 2010-05-26 ENCOUNTER — Ambulatory Visit: Payer: Self-pay | Admitting: Cardiology

## 2010-06-22 ENCOUNTER — Encounter: Payer: Self-pay | Admitting: Pulmonary Disease

## 2010-06-23 ENCOUNTER — Ambulatory Visit: Payer: Self-pay | Admitting: Internal Medicine

## 2010-06-23 LAB — CONVERTED CEMR LAB: POC INR: 2.4

## 2010-06-27 ENCOUNTER — Ambulatory Visit: Payer: Self-pay | Admitting: Internal Medicine

## 2010-06-29 ENCOUNTER — Ambulatory Visit (HOSPITAL_COMMUNITY)
Admission: RE | Admit: 2010-06-29 | Discharge: 2010-06-30 | Payer: Self-pay | Source: Home / Self Care | Admitting: Surgery

## 2010-07-10 ENCOUNTER — Ambulatory Visit: Payer: Self-pay | Admitting: Cardiology

## 2010-07-10 LAB — CONVERTED CEMR LAB: POC INR: 2.1

## 2010-07-13 ENCOUNTER — Encounter: Payer: Self-pay | Admitting: Pulmonary Disease

## 2010-07-27 ENCOUNTER — Encounter: Payer: Self-pay | Admitting: Pulmonary Disease

## 2010-07-27 DIAGNOSIS — I6529 Occlusion and stenosis of unspecified carotid artery: Secondary | ICD-10-CM | POA: Insufficient documentation

## 2010-07-28 ENCOUNTER — Ambulatory Visit: Payer: Self-pay

## 2010-07-28 ENCOUNTER — Encounter: Payer: Self-pay | Admitting: Pulmonary Disease

## 2010-08-07 ENCOUNTER — Ambulatory Visit: Payer: Self-pay | Admitting: Cardiovascular Disease

## 2010-08-07 LAB — CONVERTED CEMR LAB: POC INR: 3

## 2010-08-08 ENCOUNTER — Ambulatory Visit: Payer: Self-pay | Admitting: Pulmonary Disease

## 2010-08-25 ENCOUNTER — Encounter: Payer: Self-pay | Admitting: Pulmonary Disease

## 2010-09-08 ENCOUNTER — Ambulatory Visit: Payer: Self-pay | Admitting: Cardiovascular Disease

## 2010-10-06 ENCOUNTER — Ambulatory Visit: Payer: Self-pay | Admitting: Cardiology

## 2010-10-06 LAB — CONVERTED CEMR LAB: POC INR: 2.4

## 2010-10-19 ENCOUNTER — Ambulatory Visit: Payer: Self-pay | Admitting: Internal Medicine

## 2010-11-03 ENCOUNTER — Ambulatory Visit: Payer: Self-pay | Admitting: Cardiology

## 2010-11-03 LAB — CONVERTED CEMR LAB: POC INR: 2.4

## 2010-12-01 ENCOUNTER — Ambulatory Visit
Admission: RE | Admit: 2010-12-01 | Discharge: 2010-12-01 | Payer: Self-pay | Source: Home / Self Care | Attending: Cardiology | Admitting: Cardiology

## 2010-12-01 LAB — CONVERTED CEMR LAB: POC INR: 2.2

## 2010-12-02 ENCOUNTER — Inpatient Hospital Stay (HOSPITAL_COMMUNITY)
Admission: EM | Admit: 2010-12-02 | Discharge: 2010-12-10 | Payer: Self-pay | Source: Home / Self Care | Attending: Internal Medicine | Admitting: Internal Medicine

## 2010-12-05 ENCOUNTER — Telehealth (INDEPENDENT_AMBULATORY_CARE_PROVIDER_SITE_OTHER): Payer: Self-pay | Admitting: *Deleted

## 2010-12-05 LAB — CBC
Hemoglobin: 11 g/dL — ABNORMAL LOW (ref 12.0–15.0)
Hemoglobin: 10.6 g/dL — ABNORMAL LOW (ref 12.0–15.0)
MCH: 34.5 pg — ABNORMAL HIGH (ref 26.0–34.0)
MCH: 34.6 pg — ABNORMAL HIGH (ref 26.0–34.0)
MCH: 34.9 pg — ABNORMAL HIGH (ref 26.0–34.0)
MCHC: 34.2 g/dL (ref 30.0–36.0)
MCHC: 35.3 g/dL (ref 30.0–36.0)
MCV: 100.9 fL — ABNORMAL HIGH (ref 78.0–100.0)
Platelets: 97 10*3/uL — ABNORMAL LOW (ref 150–400)
RBC: 3.19 MIL/uL — ABNORMAL LOW (ref 3.87–5.11)
RDW: 13.8 % (ref 11.5–15.5)
WBC: 9.2 10*3/uL (ref 4.0–10.5)

## 2010-12-05 LAB — BASIC METABOLIC PANEL
BUN: 17 mg/dL (ref 6–23)
BUN: 19 mg/dL (ref 6–23)
CO2: 25 mEq/L (ref 19–32)
Calcium: 9 mg/dL (ref 8.4–10.5)
Calcium: 8.1 mg/dL — ABNORMAL LOW (ref 8.4–10.5)
Creatinine, Ser: 1.05 mg/dL (ref 0.4–1.2)
GFR calc Af Amer: >60 mL/min (ref >60)
GFR calc non Af Amer: >60 mL/min (ref >60)
GFR calc non Af Amer: 49 mL/min — ABNORMAL LOW (ref >60)
GFR calc non Af Amer: >60 mL/min (ref >60)
Glucose, Bld: 127 mg/dL — ABNORMAL HIGH (ref 70–99)
Glucose, Bld: 123 mg/dL — ABNORMAL HIGH (ref 70–99)
Glucose, Bld: 124 mg/dL — ABNORMAL HIGH (ref 70–99)
Potassium: 4 mEq/L (ref 3.5–5.1)
Potassium: 4.2 mEq/L (ref 3.5–5.1)
Sodium: 133 mEq/L — ABNORMAL LOW (ref 135–145)

## 2010-12-05 LAB — DIFFERENTIAL
Basophils Absolute: 0 10*3/uL (ref 0.0–0.1)
Basophils Relative: 0 % (ref 0–1)
Eosinophils Absolute: 0 10*3/uL (ref 0.0–0.7)
Eosinophils Relative: 0 % (ref 0–5)
Lymphs Abs: 0.5 10*3/uL — ABNORMAL LOW (ref 0.7–4.0)
Monocytes Absolute: 0.7 10*3/uL (ref 0.1–1.0)
Monocytes Relative: 7 % (ref 3–12)
Monocytes Relative: 6 % (ref 3–12)
Neutro Abs: 8.1 10*3/uL — ABNORMAL HIGH (ref 1.7–7.7)
Neutrophils Relative %: 88 % — ABNORMAL HIGH (ref 43–77)

## 2010-12-05 LAB — CARDIAC PANEL(CRET KIN+CKTOT+MB+TROPI)
CK, MB: 2.3 ng/mL (ref 0.3–4.0)
Relative Index: INVALID (ref 0.0–2.5)
Relative Index: INVALID (ref 0.0–2.5)
Total CK: 55 U/L (ref 7–177)
Total CK: 75 U/L (ref 7–177)
Total CK: 86 U/L (ref 7–177)
Troponin I: 0.04 ng/mL (ref 0.00–0.06)
Troponin I: 0.05 ng/mL (ref 0.00–0.06)

## 2010-12-05 LAB — PROTIME-INR
INR: 2.12 — ABNORMAL HIGH (ref 0.00–1.49)
Prothrombin Time: 24.5 seconds — ABNORMAL HIGH (ref 11.6–15.2)
Prothrombin Time: 23.9 seconds — ABNORMAL HIGH (ref 11.6–15.2)
Prothrombin Time: 28.6 seconds — ABNORMAL HIGH (ref 11.6–15.2)

## 2010-12-05 LAB — FERRITIN: Ferritin: 167 ng/mL (ref 10–291)

## 2010-12-05 LAB — URINALYSIS, ROUTINE W REFLEX MICROSCOPIC
Bilirubin Urine: NEGATIVE
Nitrite: NEGATIVE
Specific Gravity, Urine: 1.018 (ref 1.005–1.030)
pH: 5.5 (ref 5.0–8.0)

## 2010-12-05 LAB — FOLATE: Folate: 10.8 ng/mL

## 2010-12-05 LAB — IRON AND TIBC

## 2010-12-05 LAB — TSH: TSH: 0.409 u[IU]/mL (ref 0.350–4.500)

## 2010-12-05 LAB — URINE MICROSCOPIC-ADD ON

## 2010-12-05 LAB — VITAMIN B12: Vitamin B-12: 581 pg/mL (ref 211–911)

## 2010-12-05 LAB — PHOSPHORUS: Phosphorus: 3 mg/dL (ref 2.3–4.6)

## 2010-12-06 LAB — BASIC METABOLIC PANEL
BUN: 14 mg/dL (ref 6–23)
BUN: 11 mg/dL (ref 6–23)
CO2: 21 mEq/L (ref 19–32)
CO2: 23 mEq/L (ref 19–32)
Chloride: 96 mEq/L (ref 96–112)
GFR calc non Af Amer: >60 mL/min (ref >60)
GFR calc non Af Amer: >60 mL/min (ref >60)
Glucose, Bld: 110 mg/dL — ABNORMAL HIGH (ref 70–99)
Glucose, Bld: 99 mg/dL (ref 70–99)
Potassium: 4.7 mEq/L (ref 3.5–5.1)
Potassium: 4.3 mEq/L (ref 3.5–5.1)

## 2010-12-06 LAB — PROTIME-INR
Prothrombin Time: 30.6 seconds — ABNORMAL HIGH (ref 11.6–15.2)
Prothrombin Time: 28.2 seconds — ABNORMAL HIGH (ref 11.6–15.2)

## 2010-12-06 LAB — CBC
Hemoglobin: 9.4 g/dL — ABNORMAL LOW (ref 12.0–15.0)
MCH: 34.2 pg — ABNORMAL HIGH (ref 26.0–34.0)
MCHC: 34.4 g/dL (ref 30.0–36.0)
MCV: 99.3 fL (ref 78.0–100.0)

## 2010-12-06 LAB — OSMOLALITY, URINE: Osmolality, Ur: 329 mOsm/kg — ABNORMAL LOW (ref 390–1090)

## 2010-12-07 LAB — BASIC METABOLIC PANEL
CO2: 23 mEq/L (ref 19–32)
Chloride: 96 mEq/L (ref 96–112)
GFR calc Af Amer: >60 mL/min (ref >60)
Glucose, Bld: 96 mg/dL (ref 70–99)
Potassium: 4.8 mEq/L (ref 3.5–5.1)
Sodium: 124 mEq/L — ABNORMAL LOW (ref 135–145)

## 2010-12-08 LAB — BASIC METABOLIC PANEL
Chloride: 97 mEq/L (ref 96–112)
GFR calc Af Amer: >60 mL/min (ref >60)
GFR calc non Af Amer: >60 mL/min (ref >60)
Potassium: 4.1 mEq/L (ref 3.5–5.1)
Sodium: 127 mEq/L — ABNORMAL LOW (ref 135–145)

## 2010-12-08 LAB — PROTIME-INR
INR: 2.38 — ABNORMAL HIGH (ref 0.00–1.49)
Prothrombin Time: 26.1 seconds — ABNORMAL HIGH (ref 11.6–15.2)

## 2010-12-09 LAB — PROTIME-INR: Prothrombin Time: 27.2 seconds — ABNORMAL HIGH (ref 11.6–15.2)

## 2010-12-09 LAB — BASIC METABOLIC PANEL
BUN: 9 mg/dL (ref 6–23)
Chloride: 101 mEq/L (ref 96–112)
GFR calc non Af Amer: >60 mL/min (ref >60)
Glucose, Bld: 101 mg/dL — ABNORMAL HIGH (ref 70–99)
Potassium: 3.6 mEq/L (ref 3.5–5.1)
Sodium: 136 mEq/L (ref 135–145)

## 2010-12-10 LAB — CBC
Hemoglobin: 8.1 g/dL — ABNORMAL LOW (ref 12.0–15.0)
MCH: 34.6 pg — ABNORMAL HIGH (ref 26.0–34.0)
Platelets: 202 10*3/uL (ref 150–400)
RBC: 2.34 MIL/uL — ABNORMAL LOW (ref 3.87–5.11)
WBC: 6.3 10*3/uL (ref 4.0–10.5)

## 2010-12-10 LAB — PROTIME-INR: Prothrombin Time: 24.2 seconds — ABNORMAL HIGH (ref 11.6–15.2)

## 2010-12-10 LAB — HEMOGLOBIN AND HEMATOCRIT, BLOOD: Hemoglobin: 8.7 g/dL — ABNORMAL LOW (ref 12.0–15.0)

## 2010-12-11 ENCOUNTER — Telehealth: Payer: Self-pay | Admitting: Internal Medicine

## 2010-12-12 ENCOUNTER — Telehealth (INDEPENDENT_AMBULATORY_CARE_PROVIDER_SITE_OTHER): Payer: Self-pay | Admitting: *Deleted

## 2010-12-12 ENCOUNTER — Encounter: Payer: Self-pay | Admitting: Physician Assistant

## 2010-12-12 ENCOUNTER — Ambulatory Visit
Admission: RE | Admit: 2010-12-12 | Discharge: 2010-12-12 | Payer: Self-pay | Source: Home / Self Care | Attending: Cardiology | Admitting: Cardiology

## 2010-12-12 NOTE — Letter (Signed)
Summary: [facility]  [facility]   Imported By: Sherian Rein 04/13/2010 14:51:49  _____________________________________________________________________  External Attachment:    Type:   Image     Comment:   External Document

## 2010-12-12 NOTE — Letter (Signed)
Summary: Handout Printed  Printed Handout:  - Coumadin Instructions-w/out Meds 

## 2010-12-12 NOTE — Assessment & Plan Note (Signed)
Summary: recheck hernia/very painful/la   Primary Care Provider:  Alroy Dust  CC:  3 month ROV & add-on to check hernia....  History of Present Illness: 77 y/o WF here for a follow up visit... she has multiple medical problems as noted below...    ~  Sep09:  she reports that she was having leg muscle symptoms and weakness so she decided to stop her Vytorin and felt much better off this med... she had a cross country trip to [place] w/ her daughter and had a great time!!!  ~  Mar10:  she had a good 6months and holiday season... just notes some right shoulder pain and decr ROM & she saw DrNorris who tried a shot w/ min benefit... she is taking Glucosamine & Tylenol and will f/u w/ Ortho Prn... we discussed local heat and phys therapy...  ~  Sep10:  she saw TP in July w/ mult somatic complaints- sl HA, balance off, fatigue- Lasix adjusted... she is 77 y/o w/ LBP, neurogenic claudication/ spinal stenosis per DrRamos w/ shots in her back after holding coumadin for several days... she saw DrKlein 6/10 w/ PVC's- labs checked and all OK...    ~  January 23, 2010:  her CC is pain- back, right hip, & leg- "I'm in pain every day"... she's been eval by DrRamos w/ CT's as below, chr pain syndrome, on Tramadol/ Tylenol> we discussed options and decided to try DURAGESIC-25 Q3-4d...  she saw DrKlein 1/11> AFib & diast HF, s/p AVjunct ablation x2 & pacer> Lasix incr to 1-2 daily... Fasting labs today> generally stable... (note> the Duragesic wasn't tolerated therefore discontinued.)   ~  May 05, 2010:  she has a known LIH seen on prev CT scan by [facility] 1/11... intermittent bulge in left groin noted previously but recent incr pain & persistant bulge that is tender & hard to reduce... notes pain worse w/ cough, sneeze, straining, etc... exam show firm knot left groin that was difficult to reduce but slowly resolved w/ firm steady pressure while supine>> refer to [facility] for repair before it incarcerates> note- she is on  Coumadin for Afib, pacer...   Current Problem List:  RESPIRATORY DISORDER, CHRONIC (ICD-519.9) - she is a never smoker w/ hx obstructive asthmatic bronchitis over the years treated by DrESL... baseline CXR w/ overinflated lungs, decr markings, enlarged heart, NAD....  CORONARY ARTERY DISEASE (ICD-414.00) & Hx of ATRIAL SEPTAL DEFECT (ICD-745.5) - she has known non-obstructive CAD and is followed by DrCooper... she had an ASD repair at [facility] in [facility]... she is off ASA, and on COUMADIN per CC...  ~  last cath 1996 showed 40% Lmain, 30% proxLAD, 30%procRCA, normCIRC...  ~  negative persantine cardiolite 10/97 with EF= 82%...  ~  3/11:  she denies CP, palpit, syncope, change in SOB or edema...  ATRIAL FIBRILLATION (ICD-427.31), & PACEMAKER, PERMANENT (ICD-V45.01) - she is followed by DrKlein on Coumadin in the [facility]... s/p AV junction ablation attempts x2 by DrKlein- now considered permanent AFib & pacer placed 3/05 for SSS, generator changed 12/09.  ~  2DEcho 5/06 showed mild dil LA,  mild AI & AoV sclerosis, mod MR, EF= 65%...  ~  EKG 1/11 showed Pacer rhythm...  CAROTID BRUIT (ICD-785.9) - she had CDopplers 1996 showing mild irreg plaque bilat, no signif stenosis... she has a bruit on exam-   ~  CDopplers 9/09 showed mild irreg heterogeneous plaque, 0-39% bilat ICA stenoses...  ~  CDoppler 9/10 showed similar findings w/ 0-39% RICA stenosis &  40-59% LICA stenosis... f/u 65yr.  VENOUS INSUFFICIENCY (ICD-459.81) - she follows a low sodium diet and takes LASIX 40mg /d + MicroK-10 Bid...  HYPERCHOLESTEROLEMIA (ICD-272.0) - now off Vytorin due to muscle symptoms and doing much better... on Omega-3 Fish Oil-   ~  FLP 5/08 on Vytorin10-20 showed TChol 91, TG 47, HDL 41, LDL 41  ~  FLP 10/08 on Vytorin10-20 (1/2 tab daily) showed TChol 139, TG 58, HDL 58, LDL 70  ~  FLP 9/09 off Vytorin x40mo showed TChol 158, TG 38, HDL 46, LDL 104  ~  FLP 3/10 showed TChol 197, TG 51, HDL 57, LDL 129  ~   FLP 3/11 showed TChol 192, TG 86, HDL 70, LDL 105  DIVERTICULOSIS OF COLON (ICD-562.10) - FlexSig 1995 by DrPatterson showed divertics only...  ~  NOTE:  small left inguinal hernia seen on CT scan by DrRamos 1/11...  ** LEFT INGUINAL HERNIA>>> as above.  OVERACTIVE BLADDER (ICD-596.51) - she's on DETROL 2mg Bid per Hulda Humphrey...  DEGENERATIVE JOINT DISEASE (ICD-715.90) & BACK PAIN, LUMBAR (ICD-724.2) - she has mod LBP and known spinal stenosis... she saw DrSypher in 2008 for osteoarthritis left hand w/ cyst on thumb (surg 10/08)... on TRAMADOL per DrRamos.  ~  she has severe sp stenosis L4-5, & mod sp stenosis L3-4 >> CT scans of sp & hips showed severe scoliosis w/ advanced DDD & canal & foraminal stenosis, osteophytes, osteopenia, calcif vessels, divertics, sm left inguinal hernia.  ~  3/11:  we tried Duragesic-25 patches for her pain but it made her nauseous, dizzy & loopy so she stopped.  ~  6/11:  she tells me that DrRamos has referred her to Neuro- DrYan> pending.  OSTEOPOROSIS (ICD-733.00) - she takes ALENDRONATE 70mg /wk, Calcium, Vits, etc... she has L3 compression.  ~  BMD here 8/08 showed TScores +0.4 in Spine, and -3.4 in left FemNeck...  ~  BMD here 9/10 showed TScores +0.9 in Spine, and -3.5 in [facility]   Preventive Screening-Counseling & Management  Alcohol-Tobacco     Smoking Status: never  Allergies: 1)  ! Amiodarone Hcl (Amiodarone Hcl) 2)  ! * Fentanyl  Comments:  Nurse/Medical Assistant: The patient's medications and allergies were reviewed with the patient and were updated in the Medication and Allergy Lists.  Past History:  Past Medical History: RESPIRATORY DISORDER, CHRONIC (ICD-519.9) Hx of CORONARY ARTERY DISEASE (ICD-414.00) Hx of ATRIAL SEPTAL DEFECT (ICD-745.5) PAROXYSMAL ATRIAL FIBRILLATION (ICD-427.31) AV BLOCK, COMPLETE (ICD-426.0) PACEMAKER, PERMANENT (ICD-V45.01)-[employer] Zephyr 5626 CAROTID BRUIT (ICD-785.9) VENOUS INSUFFICIENCY  (ICD-459.81) HYPERCHOLESTEROLEMIA (ICD-272.0) DIVERTICULOSIS OF COLON (ICD-562.10) OVERACTIVE BLADDER (ICD-596.51) DEGENERATIVE JOINT DISEASE (ICD-715.90) BACK PAIN, LUMBAR (ICD-724.2) OSTEOPOROSIS (ICD-733.00)  Past Surgical History: S/P hysterectomy 1973 S/P cataract surgery S/P ASD repair S/P pacemaker implant 1996; generator replacement 01/2004- Removal of previously implanted dual-chamber   pacemaker and insertion of a new single-chamber pacemaker with pacemaker   pocket revision. --[employer] Zephyr [phone]  Family History: Reviewed history from 11/03/2008 and no changes required. Negative FH of Diabetes, Hypertension, or Coronary Artery Disease  Social History: Reviewed history from 11/03/2008 and no changes required. Retired  Single  Tobacco Use - No.  Alcohol Use - no  Review of Systems      See HPI       The patient complains of decreased hearing, dyspnea on exertion, and difficulty walking.  The patient denies anorexia, fever, weight loss, weight gain, vision loss, hoarseness, chest pain, syncope, peripheral edema, prolonged cough, headaches, hemoptysis, abdominal pain, melena, hematochezia, severe indigestion/heartburn, hematuria, incontinence,  muscle weakness, suspicious skin lesions, transient blindness, depression, unusual weight change, abnormal bleeding, enlarged lymph nodes, and angioedema.    Vital Signs:  Patient profile:   77 year old female Height:      59 inches Weight:      89.5 pounds BMI:     18.14 O2 Sat:      98 % on Room air Temp:     97.7 degrees F oral Pulse rate:   70 / minute BP sitting:   140 / 60  (left arm) Cuff size:   regular  Vitals Entered By: Randell Loop CMA (May 05, 2010 10:14 AM)  O2 Sat at Rest %:  98 O2 Flow:  Room air CC: 3 month ROV & add-on to check hernia... Is Patient Diabetic? No Pain Assessment Patient in pain? yes      Onset of pain  hernia pain Comments meds updated today with pt and daughter   Physical  Exam  Additional Exam:  WD, Thin, 77 y/o WF in NAD... appears younger that stated age.  GENERAL:  Alert & oriented; pleasant & cooperative... HEENT:  [place]/AT, EOM-full, PERRLA, EACs-clear, TMs-wnl, NOSE-clear, THROAT-clear & wnl. NECK:  Supple w/ fairROM; no JVD; normal carotid impulses w/ 2+ bruit; no thyromegaly or nodules palpated; no lymphadenopathy. CHEST:  Decr BS bilat, clear to P & A; without wheezes/ rales/ or rhonchi heard... HEART:  Irregular Rhythm; gr 1/6 SEM without rubs or gallops detected... ABDOMEN:  Soft & nontender; normal bowel sounds; firm left inguinal hernia papated & gradually reduced w/ pt in supine position. EXT: without deformities, mod arthritic changes; no varicose veins/ +venous insuffic/ tr edema. NEURO:  CN's intact; no focal neuro deficits... DERM:  No lesions noted; no rash etc...    Impression & Recommendations:  Problem # 1:  INGUINAL HERNIA (ICD-550.90) She has a mod left inguinal hernia that was somewhat difficult to reduce, but did resolve w/ firm steady pressure while supine... she is at risk for incarcerating r worse & we will set up appt w/ [facility] ASAP... Orders: Surgical Referral (Surgery)  Problem # 2:  ATRIAL FIBRILLATION--PERMANENT (ICD-427.31) She has chr AFib on Coumadin & pacer for SSS followed by DrKlein... also w/ hx nonobstructive CAD, prev ASD repair, known mod left ICA stenosis... Her updated medication list for this problem includes:    Warfarin Sodium 5 Mg Tabs (Warfarin sodium) ..... Use as directed by anticoagulation clinic  Problem # 3:  BACK PAIN, LUMBAR (ICD-724.2) Her prev CC was her back pain w/ known DJD, sp stenosis, LBP w/ scoliosis, osteoporosis etc... followed by DrRamos> we tried Duragesic 25 but it made her "nauseous, dizzy, & loopy" therefore stopped... Dr Ethelene Hal referred her to [facility] for Neuro & consult is pending. The following medications were removed from the medication list:    Fentanyl 25 Mcg/hr Pt72 (Fentanyl) .Marland Kitchen...  Apply one patch every 3 days for pain... Her updated medication list for this problem includes:    Tramadol Hcl 50 Mg Tabs (Tramadol hcl) .Marland Kitchen... As needed back pain.    Tylenol Extra Strength 500 Mg Tabs (Acetaminophen) .Marland Kitchen... As needed with the tramadol for pain  Problem # 4:  OTHER MEDICAL PROBLEMS AS NOTED>>>  Complete Medication List: 1)  Warfarin Sodium 5 Mg Tabs (Warfarin sodium) .... Use as directed by anticoagulation clinic 2)  Furosemide 40 Mg Tabs (Furosemide) .... Pt takes 2 tablets daily.  she adjusts this to fluid intake 3)  Micro-k 10 Meq Cpcr (Potassium chloride) .... Take one tablet  two times a day 4)  Omega-3 350 Mg Caps (Omega-3 fatty acids) .... Take 1 tablet by mouth once a day 5)  Detrol La 2 Mg Cp24 (Tolterodine tartrate) .... Take one tablet two times a day 6)  Alendronate Sodium 70 Mg Tabs (Alendronate sodium) .... Take one tablet by mouth every week 7)  Calcium 600/vitamin D 600-400 Mg-unit Tabs (Calcium carbonate-vitamin d) .... Take 2  tablet by mouth once a day 8)  Vitamin D 1000 Unit Tabs (Cholecalciferol) .... Take 1 tablet by mouth once a day 9)  Biotin 1000 Mcg Tabs (Biotin) .... Take 1 tablet by mouth once a day 10)  Tramadol Hcl 50 Mg Tabs (Tramadol hcl) .... As needed back pain. 11)  Tylenol Extra Strength 500 Mg Tabs (Acetaminophen) .... As needed with the tramadol for pain 12)  Vitamin C 1000 Mg Tabs (Ascorbic acid) .... Take 1 tablet by mouth once a day 13)  Vitamin E 400 Unit Caps (Vitamin e) .... Take 1 tablet by mouth once a day  Patient Instructions: 1)  Today we updated your med list- see below.... 2)  Continue your current meds the same... 3)  If the hernia bulges out> try the steady gentle pressure while lying back to reduce it... 4)  For your constipation:  MIRALAX 1 capful in water once or twice daily, & SENAKOT-S 1-2 tabs at bedtime.Marland KitchenMarland Kitchen 5)  We will arrange for a surgical consultation.Marland KitchenMarland Kitchen

## 2010-12-12 NOTE — Medication Information (Signed)
Summary: rov/tm  Anticoagulant Therapy  Managed by: Cloyde Reams, RN Referring MD: Sherryl Manges MD PCP: Alroy Dust Supervising MD: Antoine Poche MD, Fayrene Fearing Indication 1: Atrial Fibrillation (ICD-427.31) Lab Used: [facility] [place] Site: [address] INR POC 2.0 INR RANGE 2 - 3  Dietary changes: no    Health status changes: no    Bleeding/hemorrhagic complications: no    Recent/future hospitalizations: no    Any changes in medication regimen? no    Recent/future dental: no  Any missed doses?: no       Is patient compliant with meds? yes       Allergies: 1)  ! Amiodarone Hcl (Amiodarone Hcl)  Anticoagulation Management History:      The patient is taking warfarin and comes in today for a routine follow up visit.  Positive risk factors for bleeding include an age of 75 years or older.  The bleeding index is 'intermediate risk'.  Positive CHADS2 values include History of CHF and Age > 34 years old.  The start date was 03/16/1998.  Her last INR was 3.7 RATIO.  Anticoagulation responsible provider: Antoine Poche MD, Fayrene Fearing.  INR POC: 2.0.  Cuvette Lot#: [id number].  Exp: 12/2010.    Anticoagulation Management Assessment/Plan:      The patient's current anticoagulation dose is Warfarin sodium 5 mg tabs: Use as directed by Anticoagulation Clinic.  The target INR is 2 - 3.  The next INR is due 12/09/2009.  Anticoagulation instructions were given to patient.  Results were reviewed/authorized by Cloyde Reams, RN.  She was notified by Lew Dawes, PharmD Candidate.         Prior Anticoagulation Instructions: INR 3.2 Change dose to 5mg s everyday except 2.5mg s on Tuesdays and Thursdays. Recheck in 2 weeks.   Current Anticoagulation Instructions: INR 2.0  Take 1.5 tablets today then resume current dose of 1 tablet everyday except 0.5 tablet Tuesdays and Thursdays. Recheck in 3 weeks.

## 2010-12-12 NOTE — Medication Information (Signed)
Summary: rov.mp  Anticoagulant Therapy  Managed by: Cloyde Reams, RN, BSN Referring MD: Sherryl Manges MD PCP: Alroy Dust Supervising MD: Juanda Chance MD, Afsheen Antony Indication 1: Atrial Fibrillation (ICD-427.31) Lab Used: [facility] [place] Site: [address] INR POC 3.0 INR RANGE 2 - 3  Dietary changes: no    Health status changes: no    Bleeding/hemorrhagic complications: no    Recent/future hospitalizations: no    Any changes in medication regimen? no    Recent/future dental: no  Any missed doses?: no       Is patient compliant with meds? yes       Allergies (verified): 1)  ! Amiodarone Hcl (Amiodarone Hcl)  Anticoagulation Management History:      The patient is taking warfarin and comes in today for a routine follow up visit.  Positive risk factors for bleeding include an age of 75 years or older.  The bleeding index is 'intermediate risk'.  Positive CHADS2 values include Age > 75 years old.  The start date was 03/16/1998.  Her last INR was 3.7 RATIO.  Anticoagulation responsible provider: Juanda Chance MD, Smitty Cords.  INR POC: 3.0.  Cuvette Lot#: [id number].  Exp: 03/2011.    Anticoagulation Management Assessment/Plan:      The patient's current anticoagulation dose is Warfarin sodium 5 mg tabs: Use as directed by Anticoagulation Clinic.  The target INR is 2 - 3.  The next INR is due 01/27/2010.  Anticoagulation instructions were given to patient.  Results were reviewed/authorized by Cloyde Reams, RN, BSN.  She was notified by Cloyde Reams RN.         Prior Anticoagulation Instructions: INR 2.3  Continue 1 tab daily except 0.5 tab Thursdays.    Recheck in 2 weeks.    Current Anticoagulation Instructions: INR 3.0  Take 1/2 tablet today then resume same dosage 1 tablet daily except 1/2 tablet on Thursdays.  Recheck in 2 weeks.

## 2010-12-12 NOTE — Progress Notes (Signed)
Summary: appt  Phone Note Call from Patient Call back at Home Phone [phone]   Caller: Daughter//diane Call For: Hilding Quintanar Summary of Call: States pt's hernia is causing her a lot of pain, wants to be seen today. Initial call taken by: Darletta Moll,  May 05, 2010 8:57 AM  Follow-up for Phone Call        called and spoke with diane--pts daughter---she stated that the hernia that SN has been watching has started to hurt her real bad---started yesterday evening.  appt made today at 10am.  pts daughter is aware. Randell Loop CMA  May 05, 2010 9:16 AM

## 2010-12-12 NOTE — Assessment & Plan Note (Signed)
Summary: [name]   Visit Type:  Follow-up Primary Provider:  Alroy Dust  CC:  SOB/ CP/Edema.  History of Present Illness: [name]  is seen in followup for What is now permanent atrial fibrillation. She is status post AV junction on 2 separate occasions and has a previously implanted pacemaker.     She has a history of nonobstructive coronary disease by catheterization in 1997.  Echo in 2006 demonstrated normal left ventricular function.    she comes in today with complaints of edema and shortness of breath which did not respond initially doubling of her diuretic but she is better since last night when she took a 3rd lasix  She denies CP.    Current Medications (verified): 1)  Warfarin Sodium 5 Mg Tabs (Warfarin Sodium) .... Use As Directed By Anticoagulation Clinic 2)  Furosemide 40 Mg  Tabs (Furosemide) .... Pt Takes 2 Tablets Daily.  She Adjusts This To Fluid Intake 3)  Micro-K 10 Meq  Cpcr (Potassium Chloride) .... Take One Tablet Two Times A Day 4)  Omega-3 350 Mg Caps (Omega-3 Fatty Acids) .... Take 1 Tablet By Mouth Once A Day 5)  Detrol La 2 Mg  Cp24 (Tolterodine Tartrate) .... Take One Tablet Two Times A Day 6)  Alendronate Sodium 70 Mg Tabs (Alendronate Sodium) .... Take One Tablet By Mouth Every Week 7)  Calcium 600/vitamin D 600-400 Mg-Unit Tabs (Calcium Carbonate-Vitamin D) .... Take 2  Tablet By Mouth Once A Day 8)  Vitamin D 1000 Unit Tabs (Cholecalciferol) .... Take 1 Tablet By Mouth Once A Day 9)  Biotin 1000 Mcg  Tabs (Biotin) .... Take 1 Tablet By Mouth Once A Day  Allergies (verified): 1)  ! Amiodarone Hcl (Amiodarone Hcl)  Past History:  Past Medical History: Last updated: 10/14/2009 RESPIRATORY DISORDER, CHRONIC (ICD-519.9) Hx of CORONARY ARTERY DISEASE (ICD-414.00) Hx of ATRIAL SEPTAL DEFECT (ICD-745.5) PAROXYSMAL ATRIAL FIBRILLATION (ICD-427.31) AV BLOCK, COMPLETE (ICD-426.0) PACEMAKER, PERMANENT (ICD-V45.01)-[employer] Zephyr  5626 CAROTID BRUIT (ICD-785.9) VENOUS INSUFFICIENCY (ICD-459.81) HYPERCHOLESTEROLEMIA (ICD-272.0) DIVERTICULOSIS OF COLON (ICD-562.10) OVERACTIVE BLADDER (ICD-596.51) DEGENERATIVE JOINT DISEASE (ICD-715.90) BACK PAIN, LUMBAR (ICD-724.2) OSTEOPOROSIS (ICD-733.00)  Past Surgical History: Last updated: 10/14/2009 S/P hysterectomy 1973 S/P cataract surgery S/P ASD repair S/P pacemaker implant 1996; generator replacement 01/2004- Removal of previously implanted dual-chamber   pacemaker and insertion of a new single-chamber pacemaker with pacemaker   pocket revision. --[employer] Zephyr [phone]  Family History: Last updated: 11/03/2008 Negative FH of Diabetes, Hypertension, or Coronary Artery Disease  Social History: Last updated: 11/03/2008 Retired  Single  Tobacco Use - No.  Alcohol Use - no  Vital Signs:  Patient profile:   77 year old female Height:      59 inches Weight:      97 pounds Pulse rate:   70 / minute Pulse rhythm:   regular BP sitting:   123 / 58  (left arm) Cuff size:   regular  Vitals Entered By: Judithe Modest CMA (November 15, 2009 2:13 PM)  Physical Exam  General:  The patient was alert and oriented in no acute distress. HEENT Normal.  Neck veins 8-9 cm, carotids were brisk.  Lungs were clear.  Heart sounds were regular without murmurs or gallops.  Abdomen was soft with active bowel sounds. There is no clubbing cyanosis; trace edema. Skin Warm and dry    PPM Specifications Following MD:  Sherryl Manges, MD     PPM Vendor:  [employer] Jude     PPM Model Number:  [id number]     PPM Serial Number:  [id number] PPM DOI:  11/11/2008     PPM Implanting MD:  Lewayne Bunting, MD  Lead 1    Location: [facility]     DOI: 03/05/1995     Model #: [id number]     Serial #: [id number]     Status: capped Lead 2    Location: [facility]     DOI: 03/05/1995     Model #: [id number]     Serial #: [id number]     Status: active   Indications:  CHRONIC AFIB   PPM Follow Up Pacer Dependent:  Yes      Parameters Mode:   VVIR     Lower Rate Limit:  70     Upper Rate Limit:  110 Rate Response Parameters:  Threshold-Auto (-0.5), Slope-9, Reaction time-Fast, Recovery time-Slow  Impression & Recommendations:  Problem # 1:  DIASTOLIC HEART FAILURE, ACUTE (ICD-428.31) the patient has resumed acute diastolic heart failure occurring in the context of what has been previously a normal left ventricle. His are wanted finely to diuretic therapy. We will continue her on a titrated diuretics for about 10 days. She is to let us know after that time however is that she is doing.  We have deferred at this point laboratory evaluation as she is better overnight (considerably). The following medications were removed from the medication list:    Ecotrin Low Strength 81 Mg Tbec (Aspirin) .Marland Kitchen... Take 1 tab by mouth once daily... Her updated medication list for this problem includes:    Warfarin Sodium 5 Mg Tabs (Warfarin sodium) ..... Use as directed by anticoagulation clinic    Furosemide 40 Mg Tabs (Furosemide) .Marland Kitchen... Pt takes 2 tablets daily.  she adjusts this to fluid intake  Problem # 2:  ATRIAL FIBRILLATION--PERMANENT (ICD-427.31)  heart rate is controlled the AV junction The following medications were removed from the medication list:    Ecotrin Low Strength 81 Mg Tbec (Aspirin) .Marland Kitchen... Take 1 tab by mouth once daily... Her updated medication list for this problem includes:    Warfarin Sodium 5 Mg Tabs (Warfarin sodium) ..... Use as directed by anticoagulation clinic  Problem # 3:  PACEMAKER, PERMANENT-[employer]EPHYR 5626 (ICD-V45.01) normal device function improved heart rate excursion n following recent reprogramming  Patient Instructions: 1)  Increase Lasix to 40mg  two times a day for 2 days, then alternate two times a day with once daily for 10 days, then resume normal dose of 40mg  once daily  2)  Follow up in 3 months

## 2010-12-12 NOTE — Assessment & Plan Note (Signed)
Summary: DEVICE/SAF   Primary Provider:  Alroy Dust   History of Present Illness: [name]  is seen in followup for What is now permanent atrial fibrillation. She is status post AV junction on 2 separate occasions and has a previously implanted pacemaker.     she comes in today again with complaints of edema wihch she has been addressiung by adjusting her diuretics,  which responds reasonably well.  She has hernia surgery scheduled for Thurs and has preop blood work drawn to day  She denies CP.    She has a history of nonobstructive coronary disease by catheterization in 1997.  Echo in 2006 demonstrated normal left ventricular function.       Current Medications (verified): 1)  Warfarin Sodium 5 Mg Tabs (Warfarin Sodium) .... Use As Directed By Anticoagulation Clinic 2)  Furosemide 40 Mg  Tabs (Furosemide) .... Pt Takes 2 Tablets Daily.  She Adjusts This To Fluid Intake 3)  Micro-K 10 Meq  Cpcr (Potassium Chloride) .... Take One Tablet Two Times A Day 4)  Omega-3 350 Mg Caps (Omega-3 Fatty Acids) .... Take 1 Tablet By Mouth Once A Day 5)  Detrol La 2 Mg  Cp24 (Tolterodine Tartrate) .... Take One Tablet Two Times A Day 6)  Alendronate Sodium 70 Mg Tabs (Alendronate Sodium) .... Take One Tablet By Mouth Every Week 7)  Calcium 600/vitamin D 600-400 Mg-Unit Tabs (Calcium Carbonate-Vitamin D) .... Take 2  Tablet By Mouth Once A Day 8)  Vitamin D 1000 Unit Tabs (Cholecalciferol) .... Take 1 Tablet By Mouth Once A Day 9)  Biotin 1000 Mcg  Tabs (Biotin) .... Take 1 Tablet By Mouth Once A Day 10)  Tramadol Hcl 50 Mg Tabs (Tramadol Hcl) .... As Needed Back Pain. 11)  Tylenol Extra Strength 500 Mg Tabs (Acetaminophen) .... As Needed With The Tramadol For Pain 12)  Vitamin C 1000 Mg Tabs (Ascorbic Acid) .... Take 1 Tablet By Mouth Once A Day 13)  Vitamin E 400 Unit Caps (Vitamin E) .... Take 1 Tablet By Mouth Once A Day 14)  Glucosamine 500 Mg Caps (Glucosamine Sulfate) .Marland Kitchen.. 1 By Mouth  Daily 15)  Vitamin D 1000 Unit Tabs (Cholecalciferol) .Marland Kitchen.. 1 By Mouth Daily 16)  Vitamin A 8000 .Marland Kitchen.. 1 By Mouth Every Other Day  Allergies (verified): 1)  ! Amiodarone Hcl (Amiodarone Hcl) 2)  ! * Fentanyl  Past History:  Past Medical History: Last updated: 05/05/2010 RESPIRATORY DISORDER, CHRONIC (ICD-519.9) Hx of CORONARY ARTERY DISEASE (ICD-414.00) Hx of ATRIAL SEPTAL DEFECT (ICD-745.5) PAROXYSMAL ATRIAL FIBRILLATION (ICD-427.31) AV BLOCK, COMPLETE (ICD-426.0) PACEMAKER, PERMANENT (ICD-V45.01)-[employer] Zephyr 5626 CAROTID BRUIT (ICD-785.9) VENOUS INSUFFICIENCY (ICD-459.81) HYPERCHOLESTEROLEMIA (ICD-272.0) DIVERTICULOSIS OF COLON (ICD-562.10) OVERACTIVE BLADDER (ICD-596.51) DEGENERATIVE JOINT DISEASE (ICD-715.90) BACK PAIN, LUMBAR (ICD-724.2) OSTEOPOROSIS (ICD-733.00)  Vital Signs:  Patient profile:   77 year old female Height:      59 inches Weight:      92 pounds BMI:     18.65 Pulse rate:   73 / minute Resp:     16 per minute BP sitting:   153 / 64  (right arm)  Vitals Entered By: Marrion Coy, CNA (June 27, 2010 12:33 PM)  Physical Exam  General:  The patient was alert and oriented in no acute distress. HEENT Normal.  Neck veins were flat, carotids were brisk.  Lungs were clear.  Heart sounds were regular without murmurs or gallops.  Abdomen was soft with active bowel sounds. There is no clubbing cyanosis; 2+ edema Skin Warm  and dry    PPM Specifications Following MD:  Sherryl Manges, MD     PPM Vendor:  [employer] Jude     PPM Model Number:  [id number]     PPM Serial Number:  [id number] PPM DOI:  11/11/2008     PPM Implanting MD:  Lewayne Bunting, MD  Lead 1    Location: RA     DOI: 03/05/1995     Model #: [id number]     Serial #: [id number]     Status: capped Lead 2    Location: RV     DOI: 03/05/1995     Model #: [id number]     Serial #: [id number]     Status: active   Indications:  CHRONIC AFIB   PPM Follow Up Battery Voltage:  2.79 V     Battery Est. Longevity:  >10 yrs      Pacer Dependent:  Yes     Right Ventricle  Amplitude: PACED mV, Impedance: 459 ohms, Threshold: 1.250 V at 0.40 msec  Episodes MS Episodes:  0     Coumadin:  Yes Ventricular High Rate:  0     Ventricular Pacing:  >99%  Parameters Mode:  VVIR     Lower Rate Limit:  70     Upper Rate Limit:  110 Rate Response Parameters:  Threshold-Auto (-0.5), Slope-9, Reaction time-Fast, Recovery time-Slow Tech Comments:  NORMAL DEVICE FUNCTION.  PACER DEPENDENT AT VVI 30.  CHANGED RV OUTPUT FROM 1.625 TO 1.50 V (AUTOCAPTURE ON).  ROV IN 6 MTHS W/DEVICE CLINIC. [name]  June 27, 2010 12:41 PM  Impression & Recommendations:  Problem # 1:  EDEMA (ICD-782.3) Wil continue her on two times a day diuretics and wil review her blood work today  Problem # 2:  ATRIAL FIBRILLATION--PERMANENT (ICD-427.31) Permanent, sp av junction ablation Her updated medication list for this problem includes:    Warfarin Sodium 5 Mg Tabs (Warfarin sodium) ..... Use as directed by anticoagulation clinic  Problem # 3:  PACEMAKER, PERMANENT-[employer] ZEPHYR 5626 (ICD-V45.01) Device parameters and data were reviewed and no changes were made  Patient Instructions: 1)  Your physician recommends that you schedule a follow-up appointment in: 6 months with kristin and paula 2)  Your physician recommends that you continue on your current medications as directed. Please refer to the Current Medication list given to you today.

## 2010-12-12 NOTE — Letter (Signed)
Summary: [facility]  [facility]   Imported By: Lennie Odor 09/07/2010 10:59:46  _____________________________________________________________________  External Attachment:    Type:   Image     Comment:   External Document

## 2010-12-12 NOTE — Progress Notes (Signed)
Summary: **APPT 11/15/09** FLUID IN LEGS   Phone Note Call from Patient Call back at Home Phone [phone]   Caller: Daughter/DIANE Summary of Call: PT NOT FEELING WELL FLUID IN FEET AND LEGS HEART SKIPPING BEATS.CAN'T SLEEP. Initial call taken by: Judie Grieve,  November 14, 2009 8:17 AM  Follow-up for Phone Call        S/W Pt and daughter, she has followed Dr. Koren Bound advise and taken an extra Lasix for 5 days now and her feet and legs are swelling worse and she is having SOB where she is having to prop on pillows at night to sleep. She feels like her hear is thumping, when she lays down. She feels better when she is up now. She said her device was adjusted at her last visit and does not think that helped. Everyone is booked today. She is to go to the ER if she continues to feel bad. An appt was made for her to see [place] in the office tomorrow at 2pm. She and her daughter Sedalia Muta understand not to take a chance and to go straight to the ER if she starts feeling worse again today.  Follow-up by: Duncan Dull, RN, BSN,  November 14, 2009 9:45 AM

## 2010-12-12 NOTE — Medication Information (Signed)
Summary: rov/eac  Anticoagulant Therapy  Managed by: Elaina Pattee, PharmD Referring MD: Sherryl Manges MD PCP: Alroy Dust Supervising MD: Juanda Chance MD, Sherae Santino Indication 1: Atrial Fibrillation (ICD-427.31) Lab Used: [facility] [place] Site: [address] INR POC 2.3 INR RANGE 2 - 3  Dietary changes: no    Health status changes: no    Bleeding/hemorrhagic complications: no    Recent/future hospitalizations: no    Any changes in medication regimen? yes       Details: Started taking tramadol again. DC Limbrel.  Recent/future dental: no  Any missed doses?: no       Is patient compliant with meds? yes       Current Medications (verified): 1)  Warfarin Sodium 5 Mg Tabs (Warfarin Sodium) .... Use As Directed By Anticoagulation Clinic 2)  Furosemide 40 Mg  Tabs (Furosemide) .... Pt Takes 2 Tablets Daily.  She Adjusts This To Fluid Intake 3)  Micro-K 10 Meq  Cpcr (Potassium Chloride) .... Take One Tablet Two Times A Day 4)  Omega-3 350 Mg Caps (Omega-3 Fatty Acids) .... Take 1 Tablet By Mouth Once A Day 5)  Detrol La 2 Mg  Cp24 (Tolterodine Tartrate) .... Take One Tablet Two Times A Day 6)  Alendronate Sodium 70 Mg Tabs (Alendronate Sodium) .... Take One Tablet By Mouth Every Week 7)  Calcium 600/vitamin D 600-400 Mg-Unit Tabs (Calcium Carbonate-Vitamin D) .... Take 2  Tablet By Mouth Once A Day 8)  Vitamin D 1000 Unit Tabs (Cholecalciferol) .... Take 1 Tablet By Mouth Once A Day 9)  Biotin 1000 Mcg  Tabs (Biotin) .... Take 1 Tablet By Mouth Once A Day 10)  Tramadol Hcl 50 Mg Tabs (Tramadol Hcl) .... As Needed Back Pain. 11)  Tylenol Extra Strength 500 Mg Tabs (Acetaminophen) .... As Needed With The Tramadol For Pain 12)  Fentanyl 25 Mcg/hr Pt72 (Fentanyl) .... Apply One Patch Every 3 Days For Pain...  Allergies: 1)  ! Amiodarone Hcl (Amiodarone Hcl)  Anticoagulation Management History:      The patient is taking warfarin and comes in today for a routine follow up  visit.  Positive risk factors for bleeding include an age of 75 years or older.  The bleeding index is 'intermediate risk'.  Positive CHADS2 values include Age > 60 years old.  The start date was 03/16/1998.  Her last INR was 3.7 RATIO.  Anticoagulation responsible provider: Juanda Chance MD, Smitty Cords.  INR POC: 2.3.  Cuvette Lot#: [id number].  Exp: 06/2011.    Anticoagulation Management Assessment/Plan:      The patient's current anticoagulation dose is Warfarin sodium 5 mg tabs: Use as directed by Anticoagulation Clinic.  The target INR is 2 - 3.  The next INR is due 05/26/2010.  Anticoagulation instructions were given to patient.  Results were reviewed/authorized by Elaina Pattee, PharmD.  She was notified by Elaina Pattee, PharmD.         Prior Anticoagulation Instructions: INR 2.4  Continue taking 1/2 tablet on Tuesday and Thursday and 1 tablet all other days.  Return to clinic in 4 weeks.    Current Anticoagulation Instructions: INR 2.3. Take 1 tablet daily except 0.5 tablet on Tues and Thurs. Recheck in 4 weeks.

## 2010-12-12 NOTE — Medication Information (Signed)
Summary: rov/jk  Anticoagulant Therapy  Managed by: Lyna Poser, PharmD Referring MD: Sherryl Manges MD PCP: Alroy Dust Supervising MD: Excell Seltzer MD,Makynna Manocchio Indication 1: Atrial Fibrillation (ICD-427.31) Lab Used: [facility] [place] Site: [address] INR POC 3 INR RANGE 2 - 3  Dietary changes: no    Health status changes: no    Bleeding/hemorrhagic complications: no    Recent/future hospitalizations: no    Any changes in medication regimen? no    Recent/future dental: no  Any missed doses?: no       Is patient compliant with meds? yes       Allergies: 1)  ! Amiodarone Hcl (Amiodarone Hcl) 2)  ! * Fentanyl  Anticoagulation Management History:      The patient is taking warfarin and comes in today for a routine follow up visit.  Positive risk factors for bleeding include an age of 25 years or older.  The bleeding index is 'intermediate risk'.  Positive CHADS2 values include Age > 41 years old.  The start date was 03/16/1998.  Her last INR was 3.7 RATIO.  Anticoagulation responsible provider: Excell Seltzer MD,Shariyah Eland.  INR POC: 3.  Cuvette Lot#: [id number].  Exp: 08/2011.    Anticoagulation Management Assessment/Plan:      The patient's current anticoagulation dose is Warfarin sodium 5 mg tabs: Use as directed by Anticoagulation Clinic.  The target INR is 2 - 3.  The next INR is due 09/04/2010.  Anticoagulation instructions were given to patient.  Results were reviewed/authorized by Lyna Poser, PharmD.         Prior Anticoagulation Instructions: INR 2.1  Continue taking 1 tablet (5mg ) every day except take 1/2 tablet (2.5mg ) on Tuesdays and Thursdays. Recheck in 4 weeks.    Current Anticoagulation Instructions: INR 3 No changes today. Continue taking a half of a tablet on tuesday and thursday. And 1 tablet all other days. Recheck in 4 weeks.

## 2010-12-12 NOTE — Medication Information (Signed)
Summary: rov/ewj  Anticoagulant Therapy  Managed by: Cloyde Reams, RN, BSN Referring MD: Sherryl Manges MD PCP: Alroy Dust Supervising MD: Clifton James MD, Cristal Deer Indication 1: Atrial Fibrillation (ICD-427.31) Lab Used: [facility] [place] Site: [address] INR POC 3.3 INR RANGE 2 - 3  Dietary changes: no    Health status changes: no    Bleeding/hemorrhagic complications: no    Recent/future hospitalizations: no    Any changes in medication regimen? yes       Details: Started on Fentanyl patch.    Recent/future dental: no  Any missed doses?: no       Is patient compliant with meds? yes       Allergies (verified): 1)  ! Amiodarone Hcl (Amiodarone Hcl)  Anticoagulation Management History:      The patient is taking warfarin and comes in today for a routine follow up visit.  Positive risk factors for bleeding include an age of 75 years or older.  The bleeding index is 'intermediate risk'.  Positive CHADS2 values include Age > 17 years old.  The start date was 03/16/1998.  Her last INR was 3.7 RATIO.  Anticoagulation responsible provider: Clifton James MD, Cristal Deer.  INR POC: 3.3.  Cuvette Lot#: [id number].  Exp: 03/2011.    Anticoagulation Management Assessment/Plan:      The patient's current anticoagulation dose is Warfarin sodium 5 mg tabs: Use as directed by Anticoagulation Clinic.  The target INR is 2 - 3.  The next INR is due 02/10/2010.  Anticoagulation instructions were given to patient.  Results were reviewed/authorized by Cloyde Reams, RN, BSN.  She was notified by Cloyde Reams RN.         Prior Anticoagulation Instructions: INR 3.0  Take 1/2 tablet today then resume same dosage 1 tablet daily except 1/2 tablet on Thursdays.  Recheck in 2 weeks.    Current Anticoagulation Instructions: INR 3.3  Take 1/2 tablet today then start taking 1 tablet daily except 1/2 tablet on Tuesdays and Thursdays.  Recheck in 2 weeks.

## 2010-12-12 NOTE — Procedures (Signed)
Summary: device   Current Medications (verified): 1)  Warfarin Sodium 5 Mg Tabs (Warfarin Sodium) .... Use As Directed By Anticoagulation Clinic 2)  Furosemide 40 Mg  Tabs (Furosemide) .... Pt Takes 2 Tablets Daily.  She Adjusts This To Fluid Intake 3)  Micro-K 10 Meq  Cpcr (Potassium Chloride) .... Take One Tablet Two Times A Day 4)  Omega-3 350 Mg Caps (Omega-3 Fatty Acids) .... Take 1 Tablet By Mouth Once A Day 5)  Detrol La 2 Mg  Cp24 (Tolterodine Tartrate) .... Take One Tablet Two Times A Day 6)  Alendronate Sodium 70 Mg Tabs (Alendronate Sodium) .... Take One Tablet By Mouth Every Week 7)  Calcium 600/vitamin D 600-400 Mg-Unit Tabs (Calcium Carbonate-Vitamin D) .... Take 2  Tablet By Mouth Once A Day 8)  Vitamin D 1000 Unit Tabs (Cholecalciferol) .... Take 1 Tablet By Mouth Once A Day 9)  Biotin 1000 Mcg  Tabs (Biotin) .... Take 1 Tablet By Mouth Once A Day 10)  Tramadol Hcl 50 Mg Tabs (Tramadol Hcl) .... As Needed Back Pain. 11)  Tylenol Extra Strength 500 Mg Tabs (Acetaminophen) .... As Needed With The Tramadol For Pain 12)  Fentanyl 25 Mcg/hr Pt72 (Fentanyl) .... Apply One Patch Every 3 Days For Pain...  Allergies (verified): 1)  ! Amiodarone Hcl (Amiodarone Hcl)   PPM Specifications Following MD:  Sherryl Manges, MD     PPM Vendor:  [employer]     PPM Model Number:  [id number]     PPM Serial Number:  [id number] PPM DOI:  11/11/2008     PPM Implanting MD:  Lewayne Bunting, MD  Lead 1    Location: RA     DOI: 03/05/1995     Model #: [id number]     Serial #: [id number]     Status: capped Lead 2    Location: RV     DOI: 03/05/1995     Model #: 1346T     Serial #: [id number]     Status: active   Indications:  CHRONIC AFIB   PPM Follow Up Remote Check?  No Battery Voltage:  2.79 V     Battery Est. Longevity:  10 years     Pacer Dependent:  Yes     Right Ventricle  Impedance: 468 ohms, Threshold: 1.25 V at 0.4 msec  Episodes Coumadin:  Yes Ventricular Pacing:  100%  Parameters Mode:  VVIR     Lower  Rate Limit:  70     Upper Rate Limit:  110 Rate Response Parameters:  Threshold-Auto (-0.5), Slope-9, Reaction time-Fast, Recovery time-Slow Next Cardiology Appt Due:  08/12/2010 Tech Comments:  No parameter changes.  Device function normal.  Rate response blunted but adequate for the patient's level of activity.  [name] is now needing the use of a wheeled walker for ambulation.  She has not noticed any heart racing or skipping.  TTM's with [employer].  ROV 6 months with Dr. Graciela Husbands. Stephanie Harm, LPN  February 20, 2010 4:32 PM

## 2010-12-12 NOTE — Medication Information (Signed)
Summary: rov/cb  Anticoagulant Therapy  Managed by: Weston Brass, PharmD Referring MD: Sherryl Manges MD PCP: Alroy Dust Supervising MD: Riley Kill MD, Maisie Fus Indication 1: Atrial Fibrillation (ICD-427.31) Lab Used: [facility] [place] Site: [address] INR POC 2.0 INR RANGE 2 - 3  Dietary changes: yes       Details: may have been eating a little more green vegetables than before, instructed to return to previous amount  Health status changes: no    Bleeding/hemorrhagic complications: no    Recent/future hospitalizations: yes       Details: hernia operation scheduled for 8/18, will need to hold coumadin 4 days prior to procedure, will be back for appt on 8/12  Any changes in medication regimen? yes       Details: given prescription for percocet but has not taken any, given presciption for mobic but has not taken  Recent/future dental: no  Any missed doses?: no       Is patient compliant with meds? yes       Allergies: 1)  ! Amiodarone Hcl (Amiodarone Hcl) 2)  ! * Fentanyl  Anticoagulation Management History:      The patient is taking warfarin and comes in today for a routine follow up visit.  Positive risk factors for bleeding include an age of 31 years or older.  The bleeding index is 'intermediate risk'.  Positive CHADS2 values include Age > 23 years old.  The start date was 03/16/1998.  Her last INR was 3.7 RATIO.  Anticoagulation responsible provider: Riley Kill MD, Maisie Fus.  INR POC: 2.0.  Cuvette Lot#: [id number].  Exp: 07/2011.    Anticoagulation Management Assessment/Plan:      The patient's current anticoagulation dose is Warfarin sodium 5 mg tabs: Use as directed by Anticoagulation Clinic.  The target INR is 2 - 3.  The next INR is due 06/23/2010.  Anticoagulation instructions were given to patient.  Results were reviewed/authorized by Weston Brass, PharmD.  She was notified by Dillard Cannon.         Prior Anticoagulation Instructions: INR 2.3. Take 1 tablet daily  except 0.5 tablet on Tues and Thurs. Recheck in 4 weeks.  Current Anticoagulation Instructions: INR 2.0  Continue same dose of 1 tab daily except for 1/2 tab on Tuesday and Thursday.  Re-check INR in 4 weeks.

## 2010-12-12 NOTE — Medication Information (Signed)
Summary: rov/tm  Anticoagulant Therapy  Managed by: Bethena Midget, RN, BSN Referring MD: Sherryl Manges MD PCP: Alroy Dust Supervising MD: Daleen Squibb MD, Maisie Fus Indication 1: Atrial Fibrillation (ICD-427.31) Lab Used: [facility] [place] Site: [address] INR POC 1.0 INR RANGE 2 - 3  Dietary changes: no    Health status changes: no    Bleeding/hemorrhagic complications: no    Recent/future hospitalizations: no    Any changes in medication regimen? yes       Details: Tramadol discontinued and now on Tapentadol.   Recent/future dental: no  Any missed doses?: yes     Details: off since Saturday.   Is patient compliant with meds? yes      Comments: Last dose Saturday off for injection on tomorrow. Will resume post injection  Allergies: 1)  ! Amiodarone Hcl (Amiodarone Hcl)  Anticoagulation Management History:      The patient is taking warfarin and comes in today for a routine follow up visit.  Positive risk factors for bleeding include an age of 75 years or older.  The bleeding index is 'intermediate risk'.  Positive CHADS2 values include Age > 62 years old.  The start date was 03/16/1998.  Her last INR was 3.7 RATIO.  Anticoagulation responsible provider: Daleen Squibb MD, Maisie Fus.  INR POC: 1.0.  Cuvette Lot#: [id number].  Exp: 02/2011.    Anticoagulation Management Assessment/Plan:      The patient's current anticoagulation dose is Warfarin sodium 5 mg tabs: Use as directed by Anticoagulation Clinic.  The target INR is 2 - 3.  The next INR is due 12/30/2009.  Anticoagulation instructions were given to patient.  Results were reviewed/authorized by Bethena Midget, RN, BSN.  She was notified by Bethena Midget, RN, BSN.         Prior Anticoagulation Instructions: INR 1.9  Start taking 1 tablet daily except 1/2 tablet on Thursdays.  Recheck in 4 weeks.     Current Anticoagulation Instructions: INR 1.0 Tomorrow take 1 1/2  tablets then resume 1 tablet everyday except 1/2 tablet on  Thursdays. Recheck in one week.

## 2010-12-12 NOTE — Consult Note (Signed)
Summary: [facility]   Imported By: Lennie Odor 06/13/2010 12:14:09  _____________________________________________________________________  External Attachment:    Type:   Image     Comment:   External Document

## 2010-12-12 NOTE — Letter (Signed)
Summary: [facility]  [facility]   Imported By: Sherian Rein 07/06/2010 09:18:37  _____________________________________________________________________  External Attachment:    Type:   Image     Comment:   External Document

## 2010-12-12 NOTE — Medication Information (Signed)
Summary: rov/mlw  Anticoagulant Therapy  Managed by: Eda Keys, PharmD Referring MD: Sherryl Manges MD PCP: Alroy Dust Supervising MD: Tenny Craw MD, Gunnar Fusi Indication 1: Atrial Fibrillation (ICD-427.31) Lab Used: [facility] [place] Site: [address] INR POC 2.4 INR RANGE 2 - 3  Dietary changes: no    Health status changes: no    Bleeding/hemorrhagic complications: no    Recent/future hospitalizations: no    Any changes in medication regimen? yes       Details: Limbrel 500 mg  Recent/future dental: no  Any missed doses?: no       Is patient compliant with meds? yes       Current Medications (verified): 1)  Warfarin Sodium 5 Mg Tabs (Warfarin Sodium) .... Use As Directed By Anticoagulation Clinic 2)  Furosemide 40 Mg  Tabs (Furosemide) .... Pt Takes 2 Tablets Daily.  She Adjusts This To Fluid Intake 3)  Micro-K 10 Meq  Cpcr (Potassium Chloride) .... Take One Tablet Two Times A Day 4)  Omega-3 350 Mg Caps (Omega-3 Fatty Acids) .... Take 1 Tablet By Mouth Once A Day 5)  Detrol La 2 Mg  Cp24 (Tolterodine Tartrate) .... Take One Tablet Two Times A Day 6)  Alendronate Sodium 70 Mg Tabs (Alendronate Sodium) .... Take One Tablet By Mouth Every Week 7)  Calcium 600/vitamin D 600-400 Mg-Unit Tabs (Calcium Carbonate-Vitamin D) .... Take 2  Tablet By Mouth Once A Day 8)  Vitamin D 1000 Unit Tabs (Cholecalciferol) .... Take 1 Tablet By Mouth Once A Day 9)  Biotin 1000 Mcg  Tabs (Biotin) .... Take 1 Tablet By Mouth Once A Day 10)  Tramadol Hcl 50 Mg Tabs (Tramadol Hcl) .... As Needed Back Pain. 11)  Tylenol Extra Strength 500 Mg Tabs (Acetaminophen) .... As Needed With The Tramadol For Pain 12)  Fentanyl 25 Mcg/hr Pt72 (Fentanyl) .... Apply One Patch Every 3 Days For Pain... 13)  Limbrel 500 Mg Caps (Flavocoxid) .... Take 1 Tablet Twice Daily.  Allergies (verified): 1)  ! Amiodarone Hcl (Amiodarone Hcl)  Anticoagulation Management History:      The patient is taking  warfarin and comes in today for a routine follow up visit.  Positive risk factors for bleeding include an age of 75 years or older.  The bleeding index is 'intermediate risk'.  Positive CHADS2 values include Age > 35 years old.  The start date was 03/16/1998.  Her last INR was 3.7 RATIO.  Anticoagulation responsible provider: Tenny Craw MD, Gunnar Fusi.  INR POC: 2.4.  Cuvette Lot#: [id number].  Exp: 06/2011.    Anticoagulation Management Assessment/Plan:      The patient's current anticoagulation dose is Warfarin sodium 5 mg tabs: Use as directed by Anticoagulation Clinic.  The target INR is 2 - 3.  The next INR is due 04/28/2010.  Anticoagulation instructions were given to patient.  Results were reviewed/authorized by Eda Keys, PharmD.  She was notified by Eda Keys.         Prior Anticoagulation Instructions: INR 2.3  The patient is to continue with the same dose of coumadin.  This dosage includes: one tablet daily (5 mg) except 0.5 tablet (2.5 mg) on Tuesdays and Thursdays.   Next INR in 4 weeks: May 20th at 3:15 pm.  Next INR  Current Anticoagulation Instructions: INR 2.4  Continue taking 1/2 tablet on Tuesday and Thursday and 1 tablet all other days.  Return to clinic in 4 weeks.

## 2010-12-12 NOTE — Assessment & Plan Note (Signed)
Summary: rov 6 months///kp   Primary Care Provider:  Alroy Dust  CC:  6 month ROV & review of mult medical problems....  History of Present Illness: 77 y/o WF here for a follow up visit... she has multiple medical problems as noted below...    ~  Sep09:  she reports that she was having leg muscle symptoms and weakness so she decided to stop her Vytorin and felt much better off this med... she had a cross country trip to [place] w/ her daughter and had a great time!!!  ~  Mar10:  she had a good 6months and holiday season... just notes some right shoulder pain and decr ROM & she saw DrNorris who tried a shot w/ min benefit... she is taking Glucosamine & Tylenol and will f/u w/ Ortho Prn... we discussed local heat and phys therapy...  ~  Sep10:  she saw NP in July w/ mult somatic complaints- sl HA, balance off, fatigue- Lasix adjusted... she is 77 y/o w/ LBP, neurogenic claudication/ spinal stenosis per DrRamos w/ shots in her back after holding coumadin for several days... she saw DrKlein 6/10 w/ PVC's- labs checked and all OK...    ~  January 23, 2010:  her CC is pain- back, right hip, & leg- "I'm in pain every day"... she's been eval by DrRamos w/ CT's as below, chr pain syndrome, on Tramadol/ Tylenol> we discussed options and decided to try DURAGESIC-25 Q3-4d...  she saw DrKlein 1/11> AFib & diast HF, s/p AVjunct ablation x2 & pacer> Lasix incr to 1-2 daily... Fasting labs today> generally stable.   Current Problem List:  RESPIRATORY DISORDER, CHRONIC (ICD-519.9) - she is a never smoker w/ hx obstructive asthmatic bronchitis over the years treated by DrESL... baseline CXR w/ overinflated lungs, decr markings, enlarged heart, NAD....  CORONARY ARTERY DISEASE (ICD-414.00) & Hx of ATRIAL SEPTAL DEFECT (ICD-745.5) - she has known non-obstructive CAD and is followed by DrCooper... she had an ASD repair at [facility] in [facility]... she is off ASA, and on COUMADIN per CC...  ~  last cath 1996 showed 40% Lmain,  30% proxLAD, 30%procRCA, normCIRC...  ~  negative persantine cardiolite 10/97 with EF= 82%...  ~  3/11:  she denies CP, palpit, syncope, change in SOB or edema...  ATRIAL FIBRILLATION (ICD-427.31), & PACEMAKER, PERMANENT (ICD-V45.01) - she is followed by DrKlein on Coumadin in [facility]... s/p AV junction ablation attempts x2 by DrKlein- now considered permanent AFib & pacer placed 3/05 for SSS, generator changed 12/09.  ~  2DEcho 5/06 showed mild dil LA,  mild AI & AoV sclerosis, mod MR, EF= 65%...  ~  EKG 1/11 showed Pacer rhythm...  CAROTID BRUIT (ICD-785.9) - she had CDopplers 1996 showing mild irreg plaque bilat, no signif stenosis... she has a bruit on exam-   ~  CDopplers 9/09 showed mild irreg heterogeneous plaque, 0-39% bilat ICA stenoses...  ~  CDoppler 9/10 showed similar findings w/ 0-39% RICA stenosis & 40-59% LICA stenosis... f/u 24yr.  VENOUS INSUFFICIENCY (ICD-459.81) - she follows a low sodium diet and takes LASIX 40mg /d + MicroK-10 Bid...  HYPERCHOLESTEROLEMIA (ICD-272.0) - now off Vytorin due to muscle symptoms and doing much better... on Omega-3 Fish Oil-   ~  FLP 5/08 on Vytorin10-20 showed TChol 91, TG 47, HDL 41, LDL 41  ~  FLP 10/08 on Vytorin10-20 (1/2 tab daily) showed TChol 139, TG 58, HDL 58, LDL 70  ~  FLP 9/09 off Vytorin x54mo showed TChol 158, TG 38,  HDL 46, LDL 104  ~  FLP 3/10 showed TChol 197, TG 51, HDL 57, LDL 129  ~  FLP 3/11 showed TChol 192, TG 86, HDL 70, LDL 105  DIVERTICULOSIS OF COLON (ICD-562.10) - FlexSig 1995 by DrPatterson showed divertics only...  ~  NOTE:  small left inguinal hernia seen on CT scanby DrRamos 1/11...  OVERACTIVE BLADDER (ICD-596.51) - she's on DETROL 2mg Bid per Hulda Humphrey...  DEGENERATIVE JOINT DISEASE (ICD-715.90) & BACK PAIN, LUMBAR (ICD-724.2) - she has mod LBP and known spinal stenosis... she saw DrSypher in 2008 for osteoarthritis left hand w/ cyst on thumb (surg 10/08)... on TRAMADOL per DrRamos.  ~  she has  severe sp stenosisL4-5, & mod sp stenosis L3-4 >> CT scans of sp & hips showed severe scoliosis w/ advanced DDD & canal & foraminal stenosis, osteophytes, osteopenia, calcif vessels, divertics, sm left inguinal hernia.  OSTEOPOROSIS (ICD-733.00) - she takes ALENDRONATE 70mg /wk, Calcium, Vits, etc... she has L3 compression.  ~  BMD here 8/08 showed TScores +0.4 in Spine, and -3.4 in left FemNeck...  ~  BMD here 9/10 showed TScores +0.9 in Spine, and -3.5 in [facility]   Allergies: 1)  ! Amiodarone Hcl (Amiodarone Hcl)  Comments:  Nurse/Medical Assistant: The patient's medications and allergies were reviewed with the patient and were updated in the Medication and Allergy Lists.  Past History:  Past Medical History:  RESPIRATORY DISORDER, CHRONIC (ICD-519.9) Hx of CORONARY ARTERY DISEASE (ICD-414.00) Hx of ATRIAL SEPTAL DEFECT (ICD-745.5) PAROXYSMAL ATRIAL FIBRILLATION (ICD-427.31) AV BLOCK, COMPLETE (ICD-426.0) PACEMAKER, PERMANENT (ICD-V45.01)-[employer] 5626 CAROTID BRUIT (ICD-785.9) VENOUS INSUFFICIENCY (ICD-459.81) HYPERCHOLESTEROLEMIA (ICD-272.0) DIVERTICULOSIS OF COLON (ICD-562.10) OVERACTIVE BLADDER (ICD-596.51) DEGENERATIVE JOINT DISEASE (ICD-715.90) BACK PAIN, LUMBAR (ICD-724.2) OSTEOPOROSIS (ICD-733.00)  Past Surgical History: S/P hysterectomy 1973 S/P cataract surgery S/P ASD repair S/P pacemaker implant 1996; generator replacement 01/2004- Removal of previously implanted dual-chamber   pacemaker and insertion of a new single-chamber pacemaker with pacemaker   pocket revision. --[employer] [phone]  Family History: Reviewed history from 11/03/2008 and no changes required. Negative FH of Diabetes, Hypertension, or Coronary Artery Disease  Social History: Reviewed history from 11/03/2008 and no changes required. Retired  Single  Tobacco Use - No.  Alcohol Use - no  Review of Systems      See HPI       The patient complains of dyspnea on exertion,  muscle weakness, and difficulty walking.  The patient denies anorexia, fever, weight loss, weight gain, vision loss, decreased hearing, hoarseness, chest pain, syncope, peripheral edema, prolonged cough, headaches, hemoptysis, abdominal pain, melena, hematochezia, severe indigestion/heartburn, hematuria, incontinence, suspicious skin lesions, transient blindness, depression, unusual weight change, abnormal bleeding, enlarged lymph nodes, and angioedema.    Vital Signs:  Patient profile:   77 year old female Height:      59 inches Weight:      93.13 pounds O2 Sat:      98 % on Room air Temp:     98.4 degrees F oral Pulse rate:   83 / minute BP sitting:   116 / 60  (right arm) Cuff size:   regular  Vitals Entered By: Randell Loop CMA (January 23, 2010 9:50 AM)  O2 Sat at Rest %:  98 O2 Flow:  Room air CC: 6 month ROV & review of mult medical problems... Is Patient Diabetic? No Pain Assessment Patient in pain? yes      Onset of pain  SEVERE BACK PAIN Comments MEDS UPDATED TODAY   Physical Exam  Additional  Exam:  WD, Thin, 77 y/o WF in NAD... appears younger that stated age.  GENERAL:  Alert & oriented; pleasant & cooperative... HEENT:  [place]/AT, .EACs-clear, TMs-wnl, NOSE-clear, THROAT-clear & wnl. NECK:  Supple w/ fairROM; no JVD; normal carotid impulses w/ 2+ bruit; no thyromegaly or nodules palpated; no lymphadenopathy. CHEST:  Decr BS bilat, clear to P & A; without wheezes/ rales/ or rhonchi heard... HEART:  Irregular Rhythm; gr 1/6 SEM without rubs or gallops detected... ABDOMEN:  Soft & nontender; normal bowel sounds; no organomegaly or masses palpated... EXT: without deformities, mod arthritic changes; no varicose veins/ +venous insuffic/ tr edema. NEURO:  CN's intact; no focal neuro deficits. nml gait, nml grips bilaterally, equal strength bilat.  DERM:  No lesions noted; no rash etc...    Impression & Recommendations:  Problem # 1:  CARDIAC>>> She has known  nonobstructive CAD, Hx ASD repaired, AFib, Pacer > followed by DrCooper, DrKlein, et al... stable, continue Rx.  Problem # 2:  HYPERCHOLESTEROLEMIA (ICD-272.0) On diet alone- numbers are reasonable... continue diet Rx... Orders: TLB-Lipid Panel (80061-LIPID) TLB-BMP (Basic Metabolic Panel-BMET) (80048-METABOL) TLB-Hepatic/Liver Function Pnl (80076-HEPATIC) TLB-CBC Platelet - w/Differential (85025-CBCD) TLB-TSH (Thyroid Stimulating Hormone) (84443-TSH) TLB-Sedimentation Rate (ESR) (85652-ESR)  Problem # 3:  BACK PAIN, LUMBAR (ICD-724.2) We discussed her chr pain syndrome... try adding Duragesic-25. Her updated medication list for this problem includes:    Tramadol Hcl 50 Mg Tabs (Tramadol hcl) .Marland Kitchen... As needed back pain.    Tylenol Extra Strength 500 Mg Tabs (Acetaminophen) .Marland Kitchen... As needed with the tramadol for pain    Fentanyl 25 Mcg/hr Pt72 (Fentanyl) .Marland Kitchen... Apply one patch every 3 days for pain...  Problem # 4:  OSTEOPOROSIS (ICD-733.00) Continue osteoporosis Rx... Her updated medication list for this problem includes:    Alendronate Sodium 70 Mg Tabs (Alendronate sodium) .Marland Kitchen... Take one tablet by mouth every week  Problem # 5:  OTHER MEDICAL PROBLEMS AS NOTED>>>  Complete Medication List: 1)  Warfarin Sodium 5 Mg Tabs (Warfarin sodium) .... Use as directed by anticoagulation clinic 2)  Furosemide 40 Mg Tabs (Furosemide) .... Pt takes 2 tablets daily.  she adjusts this to fluid intake 3)  Micro-k 10 Meq Cpcr (Potassium chloride) .... Take one tablet two times a day 4)  Omega-3 350 Mg Caps (Omega-3 fatty acids) .... Take 1 tablet by mouth once a day 5)  Detrol La 2 Mg Cp24 (Tolterodine tartrate) .... Take one tablet two times a day 6)  Alendronate Sodium 70 Mg Tabs (Alendronate sodium) .... Take one tablet by mouth every week 7)  Calcium 600/vitamin D 600-400 Mg-unit Tabs (Calcium carbonate-vitamin d) .... Take 2  tablet by mouth once a day 8)  Vitamin D 1000 Unit Tabs  (Cholecalciferol) .... Take 1 tablet by mouth once a day 9)  Biotin 1000 Mcg Tabs (Biotin) .... Take 1 tablet by mouth once a day 10)  Tramadol Hcl 50 Mg Tabs (Tramadol hcl) .... As needed back pain. 11)  Tylenol Extra Strength 500 Mg Tabs (Acetaminophen) .... As needed with the tramadol for pain 12)  Fentanyl 25 Mcg/hr Pt72 (Fentanyl) .... Apply one patch every 3 days for pain...  Other Orders: Prescription Created Electronically [phone])  Patient Instructions: 1)  Today we updated your med list- see below.... 2)  Continue your current medications the same... 3)  We wrote a new perscription for a FENTENYL patch- change it every 3-4 days as we discussed & monitor your pain level and need for other meds... 4)  Call for  any questions.Marland KitchenMarland Kitchen 5)  Today we did your follow up FASTING blood work... please call the "phone tree" in a few days for your lab results.Marland KitchenMarland Kitchen 6)  Please schedule a follow-up appointment in 1 month. Prescriptions: FENTANYL 25 MCG/HR PT72 (FENTANYL) apply one patch every 3 days for pain...  #2 boxes x 0   Entered and Authorized by:   Michele Mcalpine MD   Signed by:   Michele Mcalpine MD on 01/23/2010   Method used:   Print then Give to Patient   RxID:   [id number]

## 2010-12-12 NOTE — Progress Notes (Signed)
Summary: surgical clearance  Phone Note From Other Clinic   Caller: nurse Pattricia Boss Summary of Call: Per Pattricia Boss from [facility] pt needs clearance for surgery. Stop coumadin 4 days prior to procedure. Is pt ok to do this?? ofc [phone] fax [phone] Initial call taken by: Edman Circle,  May 12, 2010 4:54 PM  Follow-up for Phone Call        will forward to Dr Graciela Husbands for his review  Sander Nephew, RN  I called and spoke with Pattricia Boss. The pt is pending left inguinal hernia surgery with Dr. Rayburn Ma. This will be done under general anesthesia. He request the pt hold her coumadin 4 days prior to surgery. We will review with Dr. Graciela Husbands to see if the pt is ok for surgery and to hold her coumadin. Follow-up by: Sherri Rad, RN, BSN,  May 16, 2010 9:33 AM  Additional Follow-up for Phone Call Additional follow up Details #1::        Per Dr Graciela Husbands, patient at acceptable risk for surgery, ok to hold Coumadin for 4 days prior to surgery. Gypsy Balsam RN BSN  May 16, 2010 2:58 PM   Clearance faxed to [place] at [facility]. I have called and left a message for Pattricia Boss that I have faxed the clearance. Additional Follow-up by: Sherri Rad, RN, BSN,  May 16, 2010 3:14 PM

## 2010-12-12 NOTE — Medication Information (Signed)
Summary: rov/tm  Anticoagulant Therapy  Managed by: Lynann Bologna, PharmD Referring MD: Sherryl Manges MD PCP: Alroy Dust Supervising MD: Excell Seltzer MD, Casimiro Needle Indication 1: Atrial Fibrillation (ICD-427.31) Lab Used: [facility] [place] Site: [address] INR POC 2.3 INR RANGE 2 - 3  Dietary changes: no    Health status changes: no    Bleeding/hemorrhagic complications: no    Recent/future hospitalizations: no    Any changes in medication regimen? no    Recent/future dental: no  Any missed doses?: no       Is patient compliant with meds? yes       Current Medications (verified): 1)  Warfarin Sodium 5 Mg Tabs (Warfarin Sodium) .... Use As Directed By Anticoagulation Clinic 2)  Furosemide 40 Mg  Tabs (Furosemide) .... Pt Takes 2 Tablets Daily.  She Adjusts This To Fluid Intake 3)  Micro-K 10 Meq  Cpcr (Potassium Chloride) .... Take One Tablet Two Times A Day 4)  Omega-3 350 Mg Caps (Omega-3 Fatty Acids) .... Take 1 Tablet By Mouth Once A Day 5)  Detrol La 2 Mg  Cp24 (Tolterodine Tartrate) .... Take One Tablet Two Times A Day 6)  Alendronate Sodium 70 Mg Tabs (Alendronate Sodium) .... Take One Tablet By Mouth Every Week 7)  Calcium 600/vitamin D 600-400 Mg-Unit Tabs (Calcium Carbonate-Vitamin D) .... Take 2  Tablet By Mouth Once A Day 8)  Vitamin D 1000 Unit Tabs (Cholecalciferol) .... Take 1 Tablet By Mouth Once A Day 9)  Biotin 1000 Mcg  Tabs (Biotin) .... Take 1 Tablet By Mouth Once A Day 10)  Tramadol Hcl 50 Mg Tabs (Tramadol Hcl) .... As Needed Back Pain. 11)  Tylenol Extra Strength 500 Mg Tabs (Acetaminophen) .... As Needed With The Tramadol For Pain 12)  Fentanyl 25 Mcg/hr Pt75 (Fentanyl) .... Apply One Patch Every 3 Days For Pain...  Allergies (verified): 1)  ! Amiodarone Hcl (Amiodarone Hcl)  Anticoagulation Management History:      The patient is taking warfarin and comes in today for a routine follow up visit.  Positive risk factors for bleeding  include an age of 75 years or older.  The bleeding index is 'intermediate risk'.  Positive CHADS2 values include Age > 75 years old.  The start date was 03/16/1998.  Her last INR was 3.7 RATIO.  Anticoagulation responsible provider: Excell Seltzer MD, Casimiro Needle.  INR POC: 2.3.  Cuvette Lot#: [id number].  Exp: 03/2011.    Anticoagulation Management Assessment/Plan:      The patient's current anticoagulation dose is Warfarin sodium 5 mg tabs: Use as directed by Anticoagulation Clinic.  The target INR is 2 - 3.  The next INR is due 04/03/2010.  Anticoagulation instructions were given to patient.  Results were reviewed/authorized by Lynann Bologna, PharmD.  She was notified by Lynann Bologna.         Prior Anticoagulation Instructions: INR 2.4 Continue 5mg s daily except 2.5mg s on Tuesdays and Thursdays. Recheck in 3 weeks.   Current Anticoagulation Instructions: INR 2.3  The patient is to continue with the same dose of coumadin.  This dosage includes: one tablet daily (5 mg) except 0.5 tablet (2.5 mg) on Tuesdays and Thursdays.   Next INR in 4 weeks: May 20th at 3:15 pm.  Next INR

## 2010-12-12 NOTE — Cardiovascular Report (Signed)
Summary: Office Visit   Office Visit   Imported By: Roderic Ovens 02/01/2010 16:17:47  _____________________________________________________________________  External Attachment:    Type:   Image     Comment:   External Document

## 2010-12-12 NOTE — Miscellaneous (Signed)
  Clinical Lists Changes  Observations: Added new observation of US CAROTID: Essentially stable bilateral carotid disease LICA velocities are more elevated than previous study 0-39% RICA stenosis 40-59% LICA stenosis  f/u 1 year (07/20/2009 9:23)      Carotid Doppler  Procedure date:  07/20/2009  Findings:      Essentially stable bilateral carotid disease LICA velocities are more elevated than previous study 0-39% RICA stenosis 40-59% LICA stenosis  f/u 1 year

## 2010-12-12 NOTE — Progress Notes (Signed)
Summary: coumadin appt  Phone Note Call from Patient Call back at Home Phone [phone]   Caller: Patient Reason for Call: Talk to Nurse Summary of Call: req call back..... had to cancel appt today due to sickness, is it ok for her to wait till wednesday? Initial call taken by: Migdalia Dk,  December 19, 2009 8:30 AM  Follow-up for Phone Call        Per Dr. Jimmey Ralph after her discuss with Dr Graciela Husbands pt. ok to hold for 5 day for epidural inj. on Friday. She is aware follow up s/c on 12/22/09 since appt is at 8am on Friday.  Follow-up by: Bethena Midget, RN, BSN,  December 19, 2009 3:51 PM

## 2010-12-12 NOTE — Medication Information (Signed)
Summary: [name]  Anticoagulant Therapy  Managed by: Cloyde Reams, RN Referring MD: Sherryl Manges MD PCP: Alroy Dust Supervising MD: Gala Romney MD, Reuel Boom Indication 1: Atrial Fibrillation (ICD-427.31) Lab Used: [facility] [place] Site: [address] INR POC 1.9 INR RANGE 2 - 3  Dietary changes: no    Health status changes: no    Bleeding/hemorrhagic complications: no    Recent/future hospitalizations: no    Any changes in medication regimen? yes       Details: Tramadol started, d/c hydrcodone.    Recent/future dental: no  Any missed doses?: no       Is patient compliant with meds? yes       Current Medications (verified): 1)  Warfarin Sodium 5 Mg Tabs (Warfarin Sodium) .... Use As Directed By Anticoagulation Clinic 2)  Furosemide 40 Mg  Tabs (Furosemide) .... Pt Takes 2 Tablets Daily.  She Adjusts This To Fluid Intake 3)  Micro-K 10 Meq  Cpcr (Potassium Chloride) .... Take One Tablet Two Times A Day 4)  Omega-3 350 Mg Caps (Omega-3 Fatty Acids) .... Take 1 Tablet By Mouth Once A Day 5)  Detrol La 2 Mg  Cp24 (Tolterodine Tartrate) .... Take One Tablet Two Times A Day 6)  Alendronate Sodium 70 Mg Tabs (Alendronate Sodium) .... Take One Tablet By Mouth Every Week 7)  Calcium 600/vitamin D 600-400 Mg-Unit Tabs (Calcium Carbonate-Vitamin D) .... Take 2  Tablet By Mouth Once A Day 8)  Vitamin D 1000 Unit Tabs (Cholecalciferol) .... Take 1 Tablet By Mouth Once A Day 9)  Biotin 1000 Mcg  Tabs (Biotin) .... Take 1 Tablet By Mouth Once A Day 10)  Tramadol Hcl 50 Mg Tabs (Tramadol Hcl) .... As Needed Back Pain.  Allergies (verified): 1)  ! Amiodarone Hcl (Amiodarone Hcl)  Anticoagulation Management History:      The patient is taking warfarin and comes in today for a routine follow up visit.  Positive risk factors for bleeding include an age of 72 years or older.  The bleeding index is 'intermediate risk'.  Positive CHADS2 values include History of CHF and Age > 21  years old.  The start date was 03/16/1998.  Her last INR was 3.7 RATIO.  Anticoagulation responsible provider: Bensimhon MD, Reuel Boom.  INR POC: 1.9.  Cuvette Lot#: [id number].  Exp: 02/2011.    Anticoagulation Management Assessment/Plan:      The patient's current anticoagulation dose is Warfarin sodium 5 mg tabs: Use as directed by Anticoagulation Clinic.  The target INR is 2 - 3.  The next INR is due 01/06/2010.  Anticoagulation instructions were given to patient.  Results were reviewed/authorized by Cloyde Reams, RN.  She was notified by Cloyde Reams RN.         Prior Anticoagulation Instructions: INR 2.0  Take 1.5 tablets today then resume current dose of 1 tablet everyday except 0.5 tablet Tuesdays and Thursdays. Recheck in 3 weeks.  Current Anticoagulation Instructions: INR 1.9  Start taking 1 tablet daily except 1/2 tablet on Thursdays.  Recheck in 4 weeks.    Prescriptions: WARFARIN SODIUM 5 MG TABS (WARFARIN SODIUM) Use as directed by Anticoagulation Clinic  #90 x 3   Entered by:   Cloyde Reams RN   Authorized by:   Nathen May, MD, [facility]   Signed by:   Cloyde Reams RN on 12/09/2009   Method used:   Electronically to        [facility]* (mail-order)             ,  Ph: [phone]       Fax: [phone]   RxID:   [id number]

## 2010-12-12 NOTE — Letter (Signed)
Summary: Generic Letter  Architectural technologist, Main Office  [address]   [place], [place] [address]   Phone: [phone]  Fax: [phone]        May 16, 2010 MRN: [id number]    [facility] [address] [place], [place]  [address]    Dr. Rayburn Ma,  Ms. Delancey is at acceptable cardiovascular risk for surgery. She may hold coumadin 4 days prior to surgery. If you have any questions, please contact our office.         Sincerely,   Sherryl Manges, MD Sherri Rad, RN, BSN  This letter has been electronically signed by your physician.

## 2010-12-12 NOTE — Miscellaneous (Signed)
Summary: Orders Update  Clinical Lists Changes  Problems: Added new problem of CAROTID ARTERY DISEASE (ICD-433.10) Orders: Added new Test order of Carotid Duplex (Carotid Duplex) - Signed 

## 2010-12-12 NOTE — Letter (Signed)
Summary: [facility]   Imported By: Lester Barlow 08/22/2010 08:47:38  _____________________________________________________________________  External Attachment:    Type:   Image     Comment:   External Document

## 2010-12-12 NOTE — Progress Notes (Signed)
Summary: refill meds  Phone Note Refill Request Call back at Home Phone [phone] Message from:  Patient on May 17, 2010 12:08 PM  Refills Requested: Medication #1:  FUROSEMIDE 40 MG  TABS pt takes 2 tablets daily.  She adjusts this to fluid intake [facility]. 90 day supply with 3 refill.    Method Requested: Fax to Local Pharmacy Initial call taken by: Lorne Skeens,  May 17, 2010 12:09 PM  Follow-up for Phone Call        Rx faxed to pharmacy Follow-up by: Vikki Ports,  May 19, 2010 12:10 PM    New/Updated Medications: FUROSEMIDE 40 MG  TABS (FUROSEMIDE) pt takes 2 tablets daily.  She adjusts this to fluid intake Prescriptions: FUROSEMIDE 40 MG  TABS (FUROSEMIDE) pt takes 2 tablets daily.  She adjusts this to fluid intake  #90 x 3   Entered by:   Vikki Ports   Authorized by:   Lenoria Farrier, MD, [facility]   Signed by:   Vikki Ports on 05/19/2010   Method used:   Faxed to ...       [facility] (mail-order)             , [place]         Ph: [phone]       Fax: [phone]   RxID:   [id number]

## 2010-12-12 NOTE — Cardiovascular Report (Signed)
Summary: Office Visit   Office Visit   Imported By: Roderic Ovens 06/28/2010 11:38:08  _____________________________________________________________________  External Attachment:    Type:   Image     Comment:   External Document

## 2010-12-12 NOTE — Medication Information (Signed)
Summary: rov/tm  Anticoagulant Therapy  Managed by: Shelby Dubin, PharmD, BCPS, CPP Referring MD: Sherryl Manges MD PCP: Alroy Dust Supervising MD: Shirlee Latch MD, Freida Busman Indication 1: Atrial Fibrillation (ICD-427.31) Lab Used: [facility] [place] Site: [address] INR POC 2.3 INR RANGE 2 - 3  Dietary changes: no    Health status changes: no    Bleeding/hemorrhagic complications: no    Recent/future hospitalizations: no    Any changes in medication regimen? no    Recent/future dental: no  Any missed doses?: no       Is patient compliant with meds? yes       Allergies (verified): 1)  ! Amiodarone Hcl (Amiodarone Hcl)  Anticoagulation Management History:      The patient is taking warfarin and comes in today for a routine follow up visit.  Positive risk factors for bleeding include an age of 75 years or older.  The bleeding index is 'intermediate risk'.  Positive CHADS2 values include Age > 10 years old.  The start date was 03/16/1998.  Her last INR was 3.7 RATIO.  Anticoagulation responsible provider: Shirlee Latch MD, Bentlee Drier.  INR POC: 2.3.  Cuvette Lot#: [id number].  Exp: 02/2011.    Anticoagulation Management Assessment/Plan:      The patient's current anticoagulation dose is Warfarin sodium 5 mg tabs: Use as directed by Anticoagulation Clinic.  The target INR is 2 - 3.  The next INR is due 01/13/2010.  Anticoagulation instructions were given to patient.  Results were reviewed/authorized by Shelby Dubin, PharmD, BCPS, CPP.  She was notified by Shelby Dubin PharmD, BCPS, CPP.         Prior Anticoagulation Instructions: INR 1.0 Tomorrow take 1 1/2  tablets then resume 1 tablet everyday except 1/2 tablet on Thursdays. Recheck in one week.   Current Anticoagulation Instructions: INR 2.3  Continue 1 tab daily except 0.5 tab Thursdays.    Recheck in 2 weeks.

## 2010-12-12 NOTE — Assessment & Plan Note (Signed)
Summary: rov 3 months per pt (family) //kp   Primary Care Provider:  Alroy Dust  CC:  3 month follow up---had hernia repair since last ov here--wants flu shot today.  History of Present Illness: 77 y/o WF here for a follow up visit... she has multiple medical problems as noted below...    ~  Sep09:  she reports that she was having leg muscle symptoms and weakness so she decided to stop her Vytorin and felt much better off this med... she had a cross country trip to [place] w/ her daughter and had a great time!!!  ~  Mar10:  she had a good 6months and holiday season... just notes some right shoulder pain and decr ROM & she saw DrNorris who tried a shot w/ min benefit... she is taking Glucosamine & Tylenol and will f/u w/ Ortho Prn... we discussed local heat and phys therapy...  ~  Sep10:  she saw TP in July w/ mult somatic complaints- sl HA, balance off, fatigue- Lasix adjusted... she is 77 y/o w/ LBP, neurogenic claudication/ spinal stenosis per DrRamos w/ shots in her back after holding coumadin for several days... she saw DrKlein 6/10 w/ PVC's- labs checked and all OK...    ~  January 23, 2010:  her CC is pain- back, right hip, & leg- "I'm in pain every day"... she's been eval by DrRamos w/ CT's as below, chr pain syndrome, on Tramadol/ Tylenol> we discussed options and decided to try DURAGESIC-25 Q3-4d...  she saw DrKlein 1/11> AFib & diast HF, s/p AVjunct ablation x2 & pacer> Lasix incr to 1-2 daily... Fasting labs today> generally stable... (note> the Duragesic wasn't tolerated therefore discontinued.)   ~  May 05, 2010:  she has a known LIH seen on prev CT scan by [facility] 1/11... intermittent bulge in left groin noted previously but recent incr pain & persistant bulge that is tender & hard to reduce... notes pain worse w/ cough, sneeze, straining, etc... exam show firm knot left groin that was difficult to reduce but slowly resolved w/ firm steady pressure while supine>> refer to [facility] for repair  before it incarcerates> note- she is on Coumadin for Afib, pacer...    S/P bilat bilat open IH repairs w/ mesh 8/11 by DrBlackman- CXR= cardiomeg, NAD; EKG= pacer rhythm; labs= Hg 11.8, Chem OK...   ~  August 08, 2010:  stable post bilat inguinal hernia repairs... breathing is good- no cough/ phlegm/ ch in SOB; denies CP/ palpit/ edema; saw DrKlein 8/11- stable, pacer OK, no changes made; had CDopplers 9/11- stable mild carotid dis, f/u 70yr; Chol OK on diet Rx; persistant LBP- DrRamos Rx for sp stenosis, on Tramadol, seen by DrYan for Neuro- no neuropathy & no radiculopathy... OK flu shot today.   Current Problem List:  RESPIRATORY DISORDER, CHRONIC (ICD-519.9) - she is a never smoker w/ hx obstructive asthmatic bronchitis over the years treated by DrESL... baseline CXR w/ overinflated lungs, decr markings, enlarged heart, NAD....  ~  CXR 9/11 in hosp showed cardiomeg, NAD...  CORONARY ARTERY DISEASE (ICD-414.00) & Hx of ATRIAL SEPTAL DEFECT (ICD-745.5) - she has known non-obstructive CAD and is followed by DrCooper... she had an ASD repair at [facility] in 1988... she is off ASA, and on COUMADIN per CC...  ~  last cath 1996 showed 40% Lmain, 30% proxLAD, 30%procRCA, normCIRC...  ~  negative persantine cardiolite 10/97 with EF= 82%...  ~  9/11:  she denies CP, palpit, syncope, change in SOB or edema.Marland KitchenMarland Kitchen  ATRIAL FIBRILLATION (ICD-427.31), & PACEMAKER, PERMANENT (ICD-V45.01) - she is followed by DrKlein on Coumadin in the [facility]... s/p AV junction ablation attempts x2 by DrKlein- now considered permanent AFib & pacer placed 3/05 for SSS, generator changed 12/09.  ~  2DEcho 5/06 showed mild dil LA,  mild AI & AoV sclerosis, mod MR, EF= 65%...  ~  EKG 1/11 showed Pacer rhythm...  ~  seen by DrKlein 8/11- stable, pacer OK, no changes made.  CAROTID BRUIT (ICD-785.9) - she had CDopplers 1996 showing mild irreg plaque bilat, no signif stenosis... she has a bruit on exam-   ~  CDopplers 9/09  showed mild irreg heterogeneous plaque, 0-39% bilat ICA stenoses...  ~  CDoppler 9/10 showed similar findings w/ 0-39% RICA stenosis & 40-59% LICA stenosis... f/u 36yr.  ~  CDoppler 9/11 showed same findings- repeat 65yr.  VENOUS INSUFFICIENCY (ICD-459.81) - she follows a low sodium diet and takes LASIX 40mg /d + MicroK-10 Bid...  HYPERCHOLESTEROLEMIA (ICD-272.0) - now off Vytorin due to muscle symptoms and doing much better... on Omega-3 Fish Oil-   ~  FLP 5/08 on Vytorin10-20 showed TChol 91, TG 47, HDL 41, LDL 41  ~  FLP 10/08 on Vytorin10-20 (1/2 tab daily) showed TChol 139, TG 58, HDL 58, LDL 70  ~  FLP 9/09 off Vytorin x107mo showed TChol 158, TG 38, HDL 46, LDL 104  ~  FLP 3/10 showed TChol 197, TG 51, HDL 57, LDL 129  ~  FLP 3/11 showed TChol 192, TG 86, HDL 70, LDL 105  DIVERTICULOSIS OF COLON (ICD-562.10) - FlexSig 1995 by DrPatterson showed divertics only...  ~  NOTE:  small left inguinal hernia seen on CT scan by DrRamos 1/11...  INGUINAL HERNIAs >>> s/p bilat open inguinal hernia repairs w/ mesh by Jean Rosenthal 8/11...  OVERACTIVE BLADDER (ICD-596.51) - she's on DETROL 2mg Bid per Hulda Humphrey...  DEGENERATIVE JOINT DISEASE (ICD-715.90) & BACK PAIN, LUMBAR (ICD-724.2) - she has mod LBP and known spinal stenosis... she saw DrSypher in 2008 for osteoarthritis left hand w/ cyst on thumb (surg 10/08)... on TRAMADOL per DrRamos.  ~  she has severe sp stenosis L4-5, & mod sp stenosis L3-4 >> CT scans of sp & hips showed severe scoliosis w/ advanced DDD & canal & foraminal stenosis, osteophytes, osteopenia, calcif vessels, divertics, sm left inguinal hernia.  ~  3/11:  we tried Duragesic-25 patches for her pain but it made her nauseous, dizzy & loopy so she stopped.  ~  6/11:  she tells me that DrRamos has referred her to Neuro- DrYan> no neuropathy & no radiculopathy by report.  OSTEOPOROSIS (ICD-733.00) - she takes ALENDRONATE 70mg /wk, Calcium, Vits, etc... she has L3 compression.  ~  BMD here  8/08 showed TScores +0.4 in Spine, and -3.4 in left FemNeck...  ~  BMD here 9/10 showed TScores +0.9 in Spine, and -3.5 in [facility]   Preventive Screening-Counseling & Management  Alcohol-Tobacco     Smoking Status: never  Allergies: 1)  ! Amiodarone Hcl (Amiodarone Hcl) 2)  ! * Fentanyl  Comments:  Nurse/Medical Assistant: The patient's medications and allergies were reviewed with the patient and were updated in the Medication and Allergy Lists.  Past History:  Past Medical History: RESPIRATORY DISORDER, CHRONIC (ICD-519.9) Hx of CORONARY ARTERY DISEASE (ICD-414.00) Hx of ATRIAL SEPTAL DEFECT (ICD-745.5) PAROXYSMAL ATRIAL FIBRILLATION (ICD-427.31) AV BLOCK, COMPLETE (ICD-426.0) PACEMAKER, PERMANENT (ICD-V45.01)-[employer] Zephyr [id number] CAROTID BRUIT (ICD-785.9) VENOUS INSUFFICIENCY (ICD-459.81) HYPERCHOLESTEROLEMIA (ICD-272.0) DIVERTICULOSIS OF COLON (ICD-562.10) OVERACTIVE BLADDER (ICD-596.51) DEGENERATIVE JOINT DISEASE (  ICD-715.90) BACK PAIN, LUMBAR (ICD-724.2) OSTEOPOROSIS (ICD-733.00)  Past Surgical History: S/P hysterectomy 1973 S/P cataract surgery S/P ASD repair S/P pacemaker implant 1996; generator replacement 01/2004- Removal of previously implanted dual-chamber pacemaker and insertion of a new single-chamber pacemaker with pacemaker pocket revision. --[employer] Zephyr [phone]. S/P bilat inguinal hernia repairs w/ mesh by DrBlackman 8/11  Family History: Reviewed history from 11/03/2008 and no changes required. Negative FH of Diabetes, Hypertension, or Coronary Artery Disease  Social History: Reviewed history from 11/03/2008 and no changes required. Retired  Single  Tobacco Use - No.  Alcohol Use - no  Review of Systems      See HPI       The patient complains of dyspnea on exertion.  The patient denies anorexia, fever, weight loss, weight gain, vision loss, decreased hearing, hoarseness, chest pain, syncope, peripheral edema, prolonged cough, headaches,  hemoptysis, abdominal pain, melena, hematochezia, severe indigestion/heartburn, hematuria, incontinence, muscle weakness, suspicious skin lesions, transient blindness, difficulty walking, depression, unusual weight change, abnormal bleeding, enlarged lymph nodes, and angioedema.    Vital Signs:  Patient profile:   77 year old female Height:      59 inches Weight:      90 pounds BMI:     18.24 O2 Sat:      97 % on Room air Temp:     97.3 degrees F oral Pulse rate:   77 / minute BP sitting:   142 / 70  (left arm) Cuff size:   regular  Vitals Entered By: Randell Loop CMA (August 08, 2010 2:03 PM)  O2 Sat at Rest %:  97 O2 Flow:  Room air CC: 3 month follow up---had hernia repair since last ov here--wants flu shot today Is Patient Diabetic? No Pain Assessment Patient in pain? no      Comments meds updated today with pt   Physical Exam  Additional Exam:  WD, Thin, 77 y/o WF in NAD... appears younger that stated age.  GENERAL:  Alert & oriented; pleasant & cooperative... HEENT:  [place]/AT, EOM-full, PERRLA, EACs-clear, TMs-wnl, NOSE-clear, THROAT-clear & wnl. NECK:  Supple w/ fairROM; no JVD; normal carotid impulses w/ 2+ bruit; no thyromegaly or nodules palpated; no lymphadenopathy. CHEST:  Decr BS bilat, clear to P & A; without wheezes/ rales/ or rhonchi heard... HEART:  Regular Rhythm- pacer; gr 1/6 SEM without rubs or gallops detected... ABDOMEN:  Soft & nontender; bilat inguinal hernia scars; normal bowel sounds; no organomegaly or mases palpated. EXT: without deformities, mod arthritic changes; no varicose veins/ +venous insuffic/ tr edema. NEURO:  CN's intact; no focal neuro deficits... DERM:  No lesions noted; no rash etc...    MISC. Report  Procedure date:  08/08/2010  Findings:      DATA REVIEWED:  ~  CDopplers 07/28/10...  ~  Hospitalization 8/18-19/11 by DrBlackman for bilat inguinal hernia repairs w/ CXR/ EKG/ Labs...  ~  DrKlein's EMR note 06/27/10...  ~  Dr  Ethelene Hal office note 06/22/10...   Impression & Recommendations:  Problem # 1:  Hx of CORONARY ARTERY DISEASE (ICD-414.00) Cardiac stable>  pacer OK, continue current Rx & exercise program... Her updated medication list for this problem includes:    Furosemide 40 Mg Tabs (Furosemide) .Marland Kitchen... Pt takes 2 tablets daily.  she adjusts this to fluid intake  Problem # 2:  CAROTID BRUIT (ICD-785.9) CDopplers 9/11 are unchanged, stable, continue current Rx...  Problem # 3:  HYPERCHOLESTEROLEMIA (ICD-272.0) Stable on diet Rx alone...  Problem # 4:  GI>>> Stable  s/p bilat inguinal hernia repairs...  Problem # 5:  BACK PAIN, LUMBAR (ICD-724.2) Followed by DrRamos on Tramadol... she doesn't want stronger meds... seen by DrYan on referral from DrRamos and "nothing much found"> no change in Rx recommended. Her updated medication list for this problem includes:    Tramadol Hcl 50 Mg Tabs (Tramadol hcl) .Marland Kitchen... As needed back pain.    Tylenol Extra Strength 500 Mg Tabs (Acetaminophen) .Marland Kitchen... As needed with the tramadol for pain  Problem # 6:  OTHER MEDICAL PROBLEMS AS NOTED>>> OK 2011 Flu vaccine today...  Complete Medication List: 1)  Warfarin Sodium 5 Mg Tabs (Warfarin sodium) .... Use as directed by anticoagulation clinic 2)  Furosemide 40 Mg Tabs (Furosemide) .... Pt takes 2 tablets daily.  she adjusts this to fluid intake 3)  Micro-k 10 Meq Cpcr (Potassium chloride) .... Take one tablet two times a day 4)  Omega-3 350 Mg Caps (Omega-3 fatty acids) .... Take 1 tablet by mouth once a day 5)  Detrol La 2 Mg Cp24 (Tolterodine tartrate) .... Take one tablet two times a day 6)  Alendronate Sodium 70 Mg Tabs (Alendronate sodium) .... Take one tablet by mouth every week 7)  Calcium 600/vitamin D 600-400 Mg-unit Tabs (Calcium carbonate-vitamin d) .... Take 2  tablet by mouth once a day 8)  Vitamin D 1000 Unit Tabs (Cholecalciferol) .... Take 1 tablet by mouth once a day 9)  Biotin 1000 Mcg Tabs (Biotin) ....  Take 1 tablet by mouth once a day 10)  Tramadol Hcl 50 Mg Tabs (Tramadol hcl) .... As needed back pain. 11)  Tylenol Extra Strength 500 Mg Tabs (Acetaminophen) .... As needed with the tramadol for pain 12)  Vitamin C 1000 Mg Tabs (Ascorbic acid) .... Take 1 tablet by mouth once a day 13)  Vitamin E 400 Unit Caps (Vitamin e) .... Take 1 tablet by mouth once a day 14)  Glucosamine 500 Mg Caps (Glucosamine sulfate) .Marland Kitchen.. 1 by mouth daily 15)  Vitamin D 1000 Unit Tabs (Cholecalciferol) .Marland Kitchen.. 1 by mouth daily 16)  Vitamin A 8000  .Marland KitchenMarland Kitchen. 1 by mouth every other day  Other Orders: Flu Vaccine 37yrs + MEDICARE PATIENTS (E4540) Administration Flu vaccine - MCR (J8119)  Patient Instructions: 1)  Today we updated your med list- see below.... 2)  Continue your current meds the same... 3)  Today we gave you the 2011 Flu vaccine... 4)  Stay as active as possible... 5)  Call for any problems.Marland KitchenMarland Kitchen 6)  Please schedule a follow-up appointment in 6 months, and we will plan FASTING blood work at that time...   Flu Vaccine Consent Questions     Do you have a history of severe allergic reactions to this vaccine? no    Any prior history of allergic reactions to egg and/or gelatin? no    Do you have a sensitivity to the preservative Thimersol? no    Do you have a past history of Guillan-Barre Syndrome? no    Do you currently have an acute febrile illness? no    Have you ever had a severe reaction to latex? no    Vaccine information given and explained to patient? yes    Are you currently pregnant? no    Lot Number:[id number]   Exp Date:05/12/2011   Site Given  Left Deltoid IMdflu Abigail Miyamoto RN  August 08, 2010 2:43 PM

## 2010-12-12 NOTE — Medication Information (Signed)
Summary: rov/ewj  Anticoagulant Therapy  Managed by: Bethena Midget, RN, BSN Referring MD: Sherryl Manges MD PCP: Alroy Dust Supervising MD: Tenny Craw MD, Gunnar Fusi Indication 1: Atrial Fibrillation (ICD-427.31) Lab Used: [facility] [place] Site: [address] INR POC 2.4 INR RANGE 2 - 3  Dietary changes: no    Health status changes: no    Bleeding/hemorrhagic complications: no    Recent/future hospitalizations: no    Any changes in medication regimen? yes       Details: Fentayl Patch 12.5mg s   Recent/future dental: no  Any missed doses?: no       Is patient compliant with meds? yes       Allergies: 1)  ! Amiodarone Hcl (Amiodarone Hcl)  Anticoagulation Management History:      The patient is taking warfarin and comes in today for a routine follow up visit.  Positive risk factors for bleeding include an age of 24 years or older.  The bleeding index is 'intermediate risk'.  Positive CHADS2 values include Age > 17 years old.  The start date was 03/16/1998.  Her last INR was 3.7 RATIO.  Anticoagulation responsible provider: Tenny Craw MD, Gunnar Fusi.  INR POC: 2.4.  Cuvette Lot#: [id number].  Exp: 03/2011.    Anticoagulation Management Assessment/Plan:      The patient's current anticoagulation dose is Warfarin sodium 5 mg tabs: Use as directed by Anticoagulation Clinic.  The target INR is 2 - 3.  The next INR is due 03/06/2010.  Anticoagulation instructions were given to patient.  Results were reviewed/authorized by Bethena Midget, RN, BSN.  She was notified by Bethena Midget, RN, BSN.         Prior Anticoagulation Instructions: INR 3.3  Take 1/2 tablet today then start taking 1 tablet daily except 1/2 tablet on Tuesdays and Thursdays.  Recheck in 2 weeks.    Current Anticoagulation Instructions: INR 2.4 Continue 5mg s daily except 2.5mg s on Tuesdays and Thursdays. Recheck in 3 weeks.

## 2010-12-12 NOTE — Progress Notes (Signed)
Summary: nerve study today @ [facility]- is this safe  Phone Note Call from Patient Call back at Home Phone [phone]   Caller: Daughter-dianne Reason for Call: Talk to Nurse Summary of Call: Per pt dtr calling , pt have appt today with [facility] @ 12:15. having a nerve study. is this safe for pt to have this done. h/o pacermaker. Initial call taken by: Lorne Skeens,  May 24, 2010 10:48 AM  Follow-up for Phone Call        I spoke with  [facility] and asked her to call the 800 number on the back of the pt's device card.  [employer] would be able to advise her if Neuro procedure is okay with her device.   Follow-up by: Julieta Gutting, RN, BSN,  May 24, 2010 11:13 AM

## 2010-12-12 NOTE — Medication Information (Signed)
Summary: rov/ln  Anticoagulant Therapy  Managed by: Weston Brass, PharmD Referring MD: Sherryl Manges MD PCP: Alroy Dust Supervising MD: Tenny Craw MD, Gunnar Fusi Indication 1: Atrial Fibrillation (ICD-427.31) Lab Used: [facility] [place] Site: [address] INR POC 2.4 INR RANGE 2 - 3  Dietary changes: no    Health status changes: no    Bleeding/hemorrhagic complications: no    Recent/future hospitalizations: yes       Details: Hernia surgery on Thursday, Aug. 18  Any changes in medication regimen? no     Any missed doses?: no         Allergies: 1)  ! Amiodarone Hcl (Amiodarone Hcl) 2)  ! * Fentanyl  Anticoagulation Management History:      The patient is taking warfarin and comes in today for a routine follow up visit.  Positive risk factors for bleeding include an age of 39 years or older.  The bleeding index is 'intermediate risk'.  Positive CHADS2 values include Age > 71 years old.  The start date was 03/16/1998.  Her last INR was 3.7 RATIO.  Anticoagulation responsible provider: Tenny Craw MD, Gunnar Fusi.  INR POC: 2.4.  Cuvette Lot#: [id number].  Exp: 08/2011.    Anticoagulation Management Assessment/Plan:      The patient's current anticoagulation dose is Warfarin sodium 5 mg tabs: Use as directed by Anticoagulation Clinic.  The target INR is 2 - 3.  The next INR is due 07/10/2010.  Anticoagulation instructions were given to patient.  Results were reviewed/authorized by Weston Brass, PharmD.  She was notified by Gweneth Fritter, PharmD Candidate.         Prior Anticoagulation Instructions: INR 2.0  Continue same dose of 1 tab daily except for 1/2 tab on Tuesday and Thursday.  Re-check INR in 4 weeks.    Current Anticoagulation Instructions: Take 1 tablet today and tomorrow.  Take your last dose on Saturday.  Do not take any Coumadin until procedure on the 18th.  Restart Coumadin as directed by MD.  Recheck INR on August 29th.

## 2010-12-12 NOTE — Medication Information (Signed)
Summary: ccr  Anticoagulant Therapy  Managed by: Bethena Midget, RN, BSN Referring MD: Sherryl Manges MD PCP: Alroy Dust Supervising MD: Excell Seltzer MD,Damarcus Reggio Indication 1: Atrial Fibrillation (ICD-427.31) Lab Used: [facility]  [place] Site: [address] INR POC 2.2 INR RANGE 2 - 3  Dietary changes: no    Health status changes: no    Bleeding/hemorrhagic complications: no    Recent/future hospitalizations: no    Any changes in medication regimen? no    Recent/future dental: no   Is patient compliant with meds? yes       Allergies: 1)  ! Amiodarone Hcl (Amiodarone Hcl) 2)  ! * Fentanyl  Anticoagulation Management History:      The patient is taking warfarin and comes in today for a routine follow up visit.  Positive risk factors for bleeding include an age of 10 years or older.  The bleeding index is 'intermediate risk'.  Positive CHADS2 values include Age > 5 years old.  The start date was 03/16/1998.  Her last INR was 3.7 RATIO.  Anticoagulation responsible provider: Excell Seltzer MD,Kaige Whistler.  INR POC: 2.2.  Cuvette Lot#: [id number].  Exp: 10/2011.    Anticoagulation Management Assessment/Plan:      The patient's current anticoagulation dose is Warfarin sodium 5 mg tabs: Use as directed by Anticoagulation Clinic.  The target INR is 2 - 3.  The next INR is due 10/06/2010.  Anticoagulation instructions were given to patient.  Results were reviewed/authorized by Bethena Midget, RN, BSN.  She was notified by Bethena Midget, RN, BSN.         Prior Anticoagulation Instructions: INR 3 No changes today. Continue taking a half of a tablet on tuesday and thursday. And 1 tablet all other days. Recheck in 4 weeks.  Current Anticoagulation Instructions: INR 2.2 Continue 5mg s daily except 2.5mg s on Tuesdays and Thursdays. Recheck in 4 weeks.

## 2010-12-12 NOTE — Cardiovascular Report (Signed)
Summary: TTM   TTM   Imported By: Roderic Ovens 05/22/2010 14:50:20  _____________________________________________________________________  External Attachment:    Type:   Image     Comment:   External Document

## 2010-12-12 NOTE — Medication Information (Signed)
Summary: rov/jk  Anticoagulant Therapy  Managed by: Weston Brass, PharmD Referring MD: Sherryl Manges MD PCP: Alroy Dust Supervising MD: Riley Kill MD, Maisie Fus Indication 1: Atrial Fibrillation (ICD-427.31) Lab Used: [facility] [place] Site: [address] INR POC 2.1 INR RANGE 2 - 3  Dietary changes: no    Health status changes: no    Bleeding/hemorrhagic complications: no    Recent/future hospitalizations: yes       Details: Had a double hernia repair and restarted Coumadin 8-18.  Any changes in medication regimen? no    Recent/future dental: no  Any missed doses?: yes       Is patient compliant with meds? yes       Allergies: 1)  ! Amiodarone Hcl (Amiodarone Hcl) 2)  ! * Fentanyl  Anticoagulation Management History:      The patient is taking warfarin and comes in today for a routine follow up visit.  Positive risk factors for bleeding include an age of 75 years or older.  The bleeding index is 'intermediate risk'.  Positive CHADS2 values include Age > 50 years old.  The start date was 03/16/1998.  Her last INR was 3.7 RATIO.  Anticoagulation responsible provider: Riley Kill MD, Maisie Fus.  INR POC: 2.1.  Cuvette Lot#: [id number].  Exp: 08/2011.    Anticoagulation Management Assessment/Plan:      The patient's current anticoagulation dose is Warfarin sodium 5 mg tabs: Use as directed by Anticoagulation Clinic.  The target INR is 2 - 3.  The next INR is due 08/04/2010.  Anticoagulation instructions were given to patient.  Results were reviewed/authorized by Weston Brass, PharmD.  She was notified by Gweneth Fritter, PharmD Candidate.         Prior Anticoagulation Instructions: Take 1 tablet today and tomorrow.  Take your last dose on Saturday.  Do not take any Coumadin until procedure on the 18th.  Restart Coumadin as directed by MD.  Recheck INR on August 29th.   Current Anticoagulation Instructions: INR 2.1  Continue taking 1 tablet (5mg ) every day except take 1/2 tablet  (2.5mg ) on Tuesdays and Thursdays. Recheck in 4 weeks.

## 2010-12-12 NOTE — Medication Information (Signed)
Summary: rov/tm  Anticoagulant Therapy  Managed by: Weston Brass, PharmD Referring MD: Sherryl Manges MD PCP: Alroy Dust Supervising MD: Myrtis Ser MD, Tinnie Gens Indication 1: Atrial Fibrillation (ICD-427.31) Lab Used: [facility] [place] Site: [address] INR POC 2.4 INR RANGE 2 - 3  Dietary changes: no    Health status changes: no    Bleeding/hemorrhagic complications: yes       Details: bruise on fingers  Recent/future hospitalizations: no    Any changes in medication regimen? no    Recent/future dental: no  Any missed doses?: no       Is patient compliant with meds? yes       Allergies: 1)  ! Amiodarone Hcl (Amiodarone Hcl) 2)  ! * Fentanyl  Anticoagulation Management History:      The patient is taking warfarin and comes in today for a routine follow up visit.  Positive risk factors for bleeding include an age of 75 years or older.  The bleeding index is 'intermediate risk'.  Positive CHADS2 values include Age > 36 years old.  The start date was 03/16/1998.  Her last INR was 3.7 RATIO.  Anticoagulation responsible provider: Myrtis Ser MD, Tinnie Gens.  INR POC: 2.4.  Cuvette Lot#: [id number].  Exp: 10/2011.    Anticoagulation Management Assessment/Plan:      The patient's current anticoagulation dose is Warfarin sodium 5 mg tabs: Use as directed by Anticoagulation Clinic.  The target INR is 2 - 3.  The next INR is due 11/03/2010.  Anticoagulation instructions were given to patient.  Results were reviewed/authorized by Weston Brass, PharmD.  She was notified by Weston Brass PharmD.         Prior Anticoagulation Instructions: INR 2.2 Continue 5mg s daily except 2.5mg s on Tuesdays and Thursdays. Recheck in 4 weeks.   Current Anticoagulation Instructions: INR 2.4  Continue same dose of 1 tablet every day except 1/2 tablet on Tuesday and Thursday.  Recheck INR in 4 weeks.

## 2010-12-12 NOTE — Progress Notes (Signed)
Summary: NEED TO STOP COUMADIN DUE TO PROCEDURE ON 12/23/09  Phone Note Call from Patient Call back at [facility] Phone [phone]   Caller: Patient Summary of Call: PT NEED TO STOP COUMADIN DUE TO HAVING PROCEDURE ON 12/23/09 WOULD LIKE A CALL REGARDING THIS MATTER Initial call taken by: Judie Grieve,  December 16, 2009 2:28 PM  Follow-up for Phone Call        Pt. aware to stop tomorrow and come in on Monday per Dr.Parker.  Follow-up by: Bethena Midget, RN, BSN,  December 16, 2009 4:14 PM

## 2010-12-14 ENCOUNTER — Ambulatory Visit (INDEPENDENT_AMBULATORY_CARE_PROVIDER_SITE_OTHER)
Admission: RE | Admit: 2010-12-14 | Discharge: 2010-12-14 | Disposition: A | Discharge status: Home / Self Care | Payer: Medicare Other | Source: Ambulatory Visit | Attending: Pulmonary Disease | Admitting: Pulmonary Disease

## 2010-12-14 ENCOUNTER — Encounter: Payer: Self-pay | Admitting: Pulmonary Disease

## 2010-12-14 ENCOUNTER — Other Ambulatory Visit: Payer: Self-pay | Admitting: Pulmonary Disease

## 2010-12-14 ENCOUNTER — Other Ambulatory Visit: Payer: Medicare Other

## 2010-12-14 ENCOUNTER — Ambulatory Visit (INDEPENDENT_AMBULATORY_CARE_PROVIDER_SITE_OTHER): Payer: Medicare Other | Admitting: Pulmonary Disease

## 2010-12-14 DIAGNOSIS — J189 Pneumonia, unspecified organism: Secondary | ICD-10-CM

## 2010-12-14 DIAGNOSIS — I6529 Occlusion and stenosis of unspecified carotid artery: Secondary | ICD-10-CM

## 2010-12-14 DIAGNOSIS — I5033 Acute on chronic diastolic (congestive) heart failure: Secondary | ICD-10-CM

## 2010-12-14 DIAGNOSIS — D649 Anemia, unspecified: Secondary | ICD-10-CM | POA: Insufficient documentation

## 2010-12-14 DIAGNOSIS — Z950 Presence of cardiac pacemaker: Secondary | ICD-10-CM

## 2010-12-14 DIAGNOSIS — I872 Venous insufficiency (chronic) (peripheral): Secondary | ICD-10-CM

## 2010-12-14 DIAGNOSIS — I4891 Unspecified atrial fibrillation: Secondary | ICD-10-CM

## 2010-12-14 DIAGNOSIS — I442 Atrioventricular block, complete: Secondary | ICD-10-CM

## 2010-12-14 LAB — PROTIME-INR
INR: 1.1 ratio — ABNORMAL HIGH (ref 0.8–1.0)
Prothrombin Time: 12.7 s — ABNORMAL HIGH (ref 10.2–12.4)

## 2010-12-14 LAB — HEPATIC FUNCTION PANEL
Alkaline Phosphatase: 54 U/L (ref 39–117)
Bilirubin, Direct: 0.2 mg/dL (ref 0.0–0.3)
Total Bilirubin: 1 mg/dL (ref 0.3–1.2)

## 2010-12-14 LAB — CBC WITH DIFFERENTIAL/PLATELET
Basophils Absolute: 0 10*3/uL (ref 0.0–0.1)
Basophils Relative: 0.4 % (ref 0.0–3.0)
Eosinophils Absolute: 0.1 10*3/uL (ref 0.0–0.7)
HCT: 31.1 % — ABNORMAL LOW (ref 36.0–46.0)
Hemoglobin: 10.5 g/dL — ABNORMAL LOW (ref 12.0–15.0)
Lymphocytes Relative: 18.4 % (ref 12.0–46.0)
Lymphs Abs: 1.5 10*3/uL (ref 0.7–4.0)
MCHC: 33.8 g/dL (ref 30.0–36.0)
MCV: 109.4 fl — ABNORMAL HIGH (ref 78.0–100.0)
Neutro Abs: 6 10*3/uL (ref 1.4–7.7)
RBC: 2.84 Mil/uL — ABNORMAL LOW (ref 3.87–5.11)
RDW: 14.3 % (ref 11.5–14.6)

## 2010-12-14 LAB — BASIC METABOLIC PANEL
CO2: 31 mEq/L (ref 19–32)
Calcium: 10.3 mg/dL (ref 8.4–10.5)
Creatinine, Ser: 0.7 mg/dL (ref 0.4–1.2)
GFR: 83.57 mL/min (ref >60.00)
Sodium: 130 mEq/L — ABNORMAL LOW (ref 135–145)

## 2010-12-14 LAB — IBC PANEL: Saturation Ratios: 21 % (ref 20.0–50.0)

## 2010-12-14 LAB — CONVERTED CEMR LAB
Alpha-1-Globulin: 6.4 % — ABNORMAL HIGH (ref 2.9–4.9)
Total Protein, Serum Electrophoresis: 6.7 g/dL (ref 6.0–8.3)

## 2010-12-14 NOTE — Progress Notes (Signed)
Summary: FYI  Phone Note Call from Patient   Caller: Son--rodney--[phone] Call For: [name] Reason for Call: Talk to Nurse Summary of Call: FYI: Patient's son calling to inform Dr. Kriste Basque mother is in hospital.  [facility] Rm 1425 Initial call taken by: Lehman Prom,  December 05, 2010 8:37 AM  Follow-up for Phone Call        Spoke with pt's son. He states that pt was admitted to [facility] room 1425 since 12/02/10- has PNA and is also c/o alot of stomach discomfort.  He wanted to just let SN and and states that pt would be very happy if he was able to stop in and see her. Will sign msg and forward to SN.  Follow-up by: [name],  December 05, 2010 9:10 AM

## 2010-12-14 NOTE — H&P (Signed)
[name], [name]                ACCOUNT NO.:  [id number]  MEDICAL RECORD NO.:  [id number]          PATIENT TYPE:  INP  LOCATION:  0101                         FACILITY:  [facility]  PHYSICIAN:  Stephanie Large, MD     DATE OF BIRTH:  Jun 13, 1919  DATE OF ADMISSION:  12/02/2010 DATE OF DISCHARGE:                             HISTORY & PHYSICAL   PRIMARY CARE PHYSICIAN:  [name], M.D.  CHIEF COMPLAINT:  Shortness of breath, cough and sore throat.  HISTORY OF PRESENT ILLNESS:  She is an 77 year old female with past medical history of sick sinus syndrome, status post pacemaker, history of long-term anticoagulation, history of arthritis, who was not feeling well for the last 1 week, having symptoms started with flu-like symptoms, nasal congestion, sore throat, which increased to for the last 3 days cough with nonproductive associated with fever and chills and some myalgia.  Patient denies any chest pain.  She denies any orthopnea. She was also having some bilateral lower extremity edema, which is chronic for her, but it is more than usual.  Patient denies any nausea, no vomiting.  No headache or blurring of vision.  Came to the emergency room where her temperature was 99.7, respirations were 20, pulse was 72, blood pressure was 140/75.  PAST MEDICAL HISTORY: 1. Arthritis. 2. Atrial fibrillation. 3. Spinal stenosis. 4. Pacemaker.  PAST SURGICAL HISTORY: 1. History of ablation for arrhythmia. 2. History of cataract surgery. 3. History of hernia repair. 4. History of hysterectomy. 5. History of open heart surgery. 6. History of pacemaker insertion.  SOCIAL HISTORY:  Nonsmoker, nondrinker.  FAMILY HISTORY:  Noncontributory.  ALLERGIES:  CODEINE.  MEDICATIONS AT HOME: 1. Tylenol daily. 2. Tramadol 50 as needed. 3. Lasix 40 mg once a day. 4. Coumadin 2.5 to 5 mg daily. 5. Detrol 2 mg b.i.d. 6. Vitamin C 1000 mg daily. 7. Vitamin D one tablet daily. 8. Calcium one  tablet daily. 9. Omega-3 one tablet daily.  REVIEW OF SYSTEMS:  All systems were reviewed including HEENT, nose, GI, GU, CVS, CNS, allergy, immunology, endocrine, musculoskeletal and answers were negative except what was mentioned in the H and P.  PHYSICAL EXAMINATION:  VITAL SIGNS:  Temperature is 99.7, respirations 20, pulse was 72, blood pressure is 122/71. GENERAL:  Awake, alert and oriented x3, in no distress. HEENT:  Normocephalic, atraumatic.  Conjunctivae are pink.  Pupils are equal and reactive to light. HEART:  S1, S2 regular.  No murmur, no gallop, no rub. LUNGS:  Bilateral air entry with decreased some right lower lobe rhonchi and bibasilar crackles. ABDOMEN:  Soft.  Bowel sounds present.  Nontender.  No dullness.  Guaiac were negative. GU:  Deferred. NEUROLOGIC:  Awake, alert, oriented x3.  Motor 5/5 in all 4 extremities. She has +1 edema in bilateral lower extremities.  IMAGES:  Chest x-ray shows right lower lobe infiltrate, pulmonary artery prominence.  LABORATORY DATA:  Lab shows sodium is 133, potassium is 4, chlorides is 98, carbon dioxide is 25, glucose 127, BUN is 13, creatinine 0.7.  INR is 2.1.  PT is 24.  WBCs 9.0, hemoglobin is 11 and  platelet is 131.  ASSESSMENT: 1. Community-acquired pneumonia. 2. Bilateral leg swelling. 3. History of atrial fibrillation, status post pacemaker. 4. Deep venous thrombosis prophylaxis.  PLAN: 1. For community-acquired pneumonia, we will start the patient on IV     Rocephin and Zithromax.  We will do breathing treatment, blood     culture x2. 2. Bilateral lower extremity swelling, continue with IV Lasix for now.     He will get I's and O's and get a 2-D echo. 3. History of Afib, it is rate-controlled, continue with     anticoagulation. 4. DVT prophylaxis.  Patient already on the Coumadin.  The INR is     therapeutic. 5. GI prophylaxis.  Protonix 40 mg daily. 6. Patient is a full code.           ______________________________ Stephanie Large, MD     SA/MEDQ  D:  12/02/2010  T:  12/02/2010  Job:  [id number]  cc:   Stephanie Cloud. Kriste Basque, MD [address] [place] [address]  Electronically Signed by Stephanie Large MD on 12/14/2010 03:34:12 PM

## 2010-12-14 NOTE — Medication Information (Signed)
Summary: rov/jb  Anticoagulant Therapy  Managed by: Bethena Midget, RN, BSN Referring MD: Sherryl Manges MD PCP: Alroy Dust Supervising MD: Shirlee Latch MD, Aalaiyah Yassin Indication 1: Atrial Fibrillation (ICD-427.31) Lab Used: [facility] [place] Site: [address] INR POC 2.2 INR RANGE 2 - 3  Dietary changes: no    Health status changes: no    Bleeding/hemorrhagic complications: no    Recent/future hospitalizations: no    Any changes in medication regimen? no    Recent/future dental: no  Any missed doses?: no       Is patient compliant with meds? yes       Allergies: 1)  ! Amiodarone Hcl (Amiodarone Hcl) 2)  ! * Fentanyl  Anticoagulation Management History:      The patient is taking warfarin and comes in today for a routine follow up visit.  Positive risk factors for bleeding include an age of 75 years or older.  The bleeding index is 'intermediate risk'.  Positive CHADS2 values include Age > 14 years old.  The start date was 03/16/1998.  Her last INR was 3.7 RATIO.  Anticoagulation responsible provider: Shirlee Latch MD, Clydell Sposito.  INR POC: 2.2.  Cuvette Lot#: [id number].  Exp: 11/2011.    Anticoagulation Management Assessment/Plan:      The patient's current anticoagulation dose is Warfarin sodium 5 mg tabs: Use as directed by Anticoagulation Clinic.  The target INR is 2 - 3.  The next INR is due 12/29/2010.  Anticoagulation instructions were given to patient.  Results were reviewed/authorized by Bethena Midget, RN, BSN.  She was notified by Bethena Midget, RN, BSN.         Prior Anticoagulation Instructions: Cont with current regimen Return to clinic Jan 20th, at 3:00 pm  Current Anticoagulation Instructions: INR 2.2 Continue 5mg  everyday except 2.5mg s on Tuesdays and Thursdays. Recheck in 4 weeks.  Prescriptions: WARFARIN SODIUM 5 MG TABS (WARFARIN SODIUM) Use as directed by Anticoagulation Clinic  #90 x 3   Entered by:   Bethena Midget, RN, BSN   Authorized by:   Nathen May, MD, [facility]   Signed by:   Bethena Midget, RN, BSN on 12/01/2010   Method used:   Electronically to        [facility]* (retail)             ,          Ph: [phone]       Fax: [phone]   RxID:   [id number]

## 2010-12-14 NOTE — Cardiovascular Report (Signed)
Summary: TTM   TTM   Imported By: Roderic Ovens 10/27/2010 15:11:32  _____________________________________________________________________  External Attachment:    Type:   Image     Comment:   External Document

## 2010-12-14 NOTE — Medication Information (Signed)
Summary: rov/sp  Anticoagulant Therapy  Managed by: Samantha Crimes, PharmD Referring MD: Sherryl Manges MD PCP: Alroy Dust Supervising MD: Riley Kill MD, Maisie Fus Indication 1: Atrial Fibrillation (ICD-427.31) Lab Used: [facility] [place] Site: [address] INR POC 2.4 INR RANGE 2 - 3  Dietary changes: no    Health status changes: no    Bleeding/hemorrhagic complications: no    Recent/future hospitalizations: no    Any changes in medication regimen? no    Recent/future dental: no  Any missed doses?: no       Is patient compliant with meds? yes       Current Medications (verified): 1)  Warfarin Sodium 5 Mg Tabs (Warfarin Sodium) .... Use As Directed By Anticoagulation Clinic 2)  Furosemide 40 Mg  Tabs (Furosemide) .... Pt Takes 2 Tablets Daily.  She Adjusts This To Fluid Intake 3)  Micro-K 10 Meq  Cpcr (Potassium Chloride) .... Take One Tablet Two Times A Day 4)  Omega-3 350 Mg Caps (Omega-3 Fatty Acids) .... Take 1 Tablet By Mouth Once A Day 5)  Detrol La 2 Mg  Cp24 (Tolterodine Tartrate) .... Take One Tablet Two Times A Day 6)  Alendronate Sodium 70 Mg Tabs (Alendronate Sodium) .... Take One Tablet By Mouth Every Week 7)  Calcium 600/vitamin D 600-400 Mg-Unit Tabs (Calcium Carbonate-Vitamin D) .... Take 2  Tablet By Mouth Once A Day 8)  Vitamin D 1000 Unit Tabs (Cholecalciferol) .... Take 1 Tablet By Mouth Once A Day 9)  Biotin 1000 Mcg  Tabs (Biotin) .... Take 1 Tablet By Mouth Once A Day 10)  Tramadol Hcl 50 Mg Tabs (Tramadol Hcl) .... As Needed Back Pain. 11)  Tylenol Extra Strength 500 Mg Tabs (Acetaminophen) .... As Needed With The Tramadol For Pain 12)  Vitamin C 1000 Mg Tabs (Ascorbic Acid) .... Take 1 Tablet By Mouth Once A Day 13)  Vitamin E 400 Unit Caps (Vitamin E) .... Take 1 Tablet By Mouth Once A Day 14)  Glucosamine 500 Mg Caps (Glucosamine Sulfate) .Marland Kitchen.. 1 By Mouth Daily 15)  Vitamin D 1000 Unit Tabs (Cholecalciferol) .Marland Kitchen.. 1 By Mouth Daily 16)   Vitamin A 8000 .Marland Kitchen.. 1 By Mouth Every Other Day  Allergies (verified): 1)  ! Amiodarone Hcl (Amiodarone Hcl) 2)  ! * Fentanyl  Anticoagulation Management History:      Positive risk factors for bleeding include an age of 62 years or older.  The bleeding index is 'intermediate risk'.  Positive CHADS2 values include Age > 91 years old.  The start date was 03/16/1998.  Her last INR was 3.7 RATIO.  Anticoagulation responsible provider: Riley Kill MD, Maisie Fus.  INR POC: 2.4.  Exp: 10/2011.    Anticoagulation Management Assessment/Plan:      The patient's current anticoagulation dose is Warfarin sodium 5 mg tabs: Use as directed by Anticoagulation Clinic.  The target INR is 2 - 3.  The next INR is due 12/01/2010.  Anticoagulation instructions were given to patient.  Results were reviewed/authorized by Samantha Crimes, PharmD.         Prior Anticoagulation Instructions: INR 2.4  Continue same dose of 1 tablet every day except 1/2 tablet on Tuesday and Thursday.  Recheck INR in 4 weeks.   Current Anticoagulation Instructions: Cont with current regimen Return to clinic Jan 20th, at 3:00 pm

## 2010-12-18 ENCOUNTER — Telehealth: Payer: Self-pay | Admitting: Internal Medicine

## 2010-12-20 NOTE — Miscellaneous (Signed)
  Clinical Lists Changes  Medications: Changed medication from FUROSEMIDE 40 MG  TABS (FUROSEMIDE) pt takes 2 tablets two times a day for 2 days then resume previous dose as instructed as per [name], PA-C to FUROSEMIDE 40 MG  TABS (FUROSEMIDE) Take 1 tablet by mouth once a day or as directed Changed medication from MICRO-K 10 MEQ  CPCR (POTASSIUM CHLORIDE) 2 tabs two times a day for 2 days then resume previous dose as instructed as per [name], PA-C to MICRO-K 10 MEQ  CPCR (POTASSIUM CHLORIDE) Take 1 tablet by mouth two times a day or as directed

## 2010-12-20 NOTE — Discharge Summary (Signed)
[name], [name]                ACCOUNT NO.:  [id number]  MEDICAL RECORD NO.:  [id number]          PATIENT TYPE:  INP  LOCATION:  1425                         FACILITY:  [facility]  PHYSICIAN:  Clydia Llano, MD       DATE OF BIRTH:  [birth year]  DATE OF ADMISSION:  12/02/2010 DATE OF DISCHARGE:                              DISCHARGE SUMMARY   PRIMARY CARE PHYSICIAN:  Lonzo Cloud. Kriste Basque, M.D.  PRIMARY CARDIOLOGIST:  Duke Salvia, MD, [facility]  REASON FOR ADMISSION:  Shortness of breath.  DISCHARGE DIAGNOSES: 1. Right lower lobe community-acquired pneumonia. 2. Bilateral rectus sheath hematoma measuring 3.5 cm at the most. 3. Hyponatremia, resolved. 4. Chronic atrial fibrillation. 5. Chronic Coumadin therapy. 6. Arthritis. 7. Spinal stenosis. 8. Status post permanent pacemaker secondary to complete heart block. 9. Chronic diastolic heart failure. 10.Anemia.  DISCHARGE MEDICATIONS: 1. Ferrous sulfate 325 mg p.o. b.i.d. 2. Biotin 1000 mg OTC p.o. daily. 3. Calcium carbonate/Vitamin D 1500 mg OTC. 4. Detrol 2 mg b.i.d. 5. Fosamax 70 mg p.o. weekly. 6. Glucosamine OTC one tablet p.o. daily. 7. Lasix 40 mg p.o. b.i.d. 8. Omega-3 suspension OTC 2.5 grams p.o. daily. 9. Potassium chloride 10 mEq p.o. b.i.d. 10.Tramadol 50 mg one tablet every 6 hours as needed for pain. 11.Tylenol Arthritis OTC two tablets every 8 hours as needed for     arthritis pain. 12.Vitamin A 8000 units one capsule every other day. 13.Vitamin C 1000 mg p.o. daily. 14.Vitamin D 1000 units one tablet p.o. daily. 15.Vitamin E 400 international units OTC.  Stop taking the following medication: 1. Coumadin 5 mg half to one tablet p.o. q.p.m.  RADIOLOGIC: 1. CT abdomen and pelvis:     a.     Possible bilateral inferior rectus sheath hematoma, right      greater than left.     b.     Anasarca.     c.     Bilateral pleural effusion.     d.     Possible hepatic cirrhosis.     e.     There is sigmoid colon  diverticulosis without evidence of      diverticulitis. 2. X-ray of the abdomen January 25 showed fluid in the bowel.     Otherwise a benign-appearing abdomen. 3. Chest x-ray January 21 showed small bilateral effusion, persistent     right lower lobe infiltrates with new small patchy infiltrates in     the left base and in the right middle lobe. 4. Chest x-ray January 21 right lower lobe infiltrates.  BRIEF HOSPITAL COURSE: 1. Right lower lobe pneumonia.  The patient admitted for community-     acquired pneumonia.  The patient was started on Rocephin and     erythromycin.  The patient had a total of 7 days of IV antibiotics     while she was in the hospital.  Her cough, breathing and sputum     improved significantly.  The patient was felt that she was okay to     go home.  The patient walking around without need of oxygen. 2. Rectus sheath hematoma.  The  patient was complaining about some     vague abdominal discomfort, especially after eating.  The patient     was tried on PPI, did not make a big difference.  Chest x-ray did     not show much.  CT scan of the abdomen showed possible bilateral     rectus sheath hematoma, the right bigger than the left, measuring     3.5 cm at the biggest diameter.  I spoke with the radiologist.     This is not that significant..  Coumadin and other blood thinners     will be stopped for 2 weeks.  The patient should follow up with her     primary care physician or cardiologist to double check her before     restarting the Coumadin.  Some people for bigger hematomas     recommend repeat of the CT scan to ensure resolution before     restarting Coumadin, but I think this is small enough and I will     not repeat the CT scan unless the patient is still symptomatic or     it is clinically bigger on the examination.  The patient instructed     not to take Coumadin or aspirin until she checks back with her     primary care physician. 3. Anemia.  The patient  came in with a hemoglobin of 11 which it was     thought that the number was inflated secondary to dehydration and     as soon as the patient got some IV fluids her hemoglobin dropped to     9.7.  Anemia panel was obtained which showed low iron was less than     10 but ferritin was 167.  The ferritin could be high because it is     acute phase reactant.  The patient denies any black stools or     bloody stools.  The rectus sheath hematoma mentioned above is not     that significant to cause anemia.  The patient was put on iron     supplements and for sure she needs assessment as an outpatient as     she is going to be back on Coumadin sometime.  On the day of     discharge her hemoglobin is 8.7. 4. Hyponatremia.  On the day of admission her sodium was 133 and went     down progressively to 123 over 3 days.  The patient's hyponatremia     is probably secondary to SIADH from her pneumonia.  That was     treated with IV fluids and Lasix as well as fluid restriction.  Her     sodium on the of discharge is 136. 5. Chronic diastolic heart failure.  The patient's ejection fraction     from echocardiogram done on January 22 is 65% to 70%.  The patient     on Lasix 40 mg b.i.d.  Her BNP was a little bit more than 200.  The     patient was treated with Lasix and she required some normal saline     to correct her hyponatremia, so Lasix was switched to IV and then     she was diuresed aggressively.  She was not having acute     exacerbation of her heart failure but the aggressive diuresis was     to correct the hyponatremia. 6. Atrial fibrillation.  Rate is controlled.  The patient has complete     heart  block and permanent pacemaker.  She is on Coumadin.  Coumadin     will be stopped because of rectus sheath hematoma.  Needs     reassessment for restarting the Coumadin with her primary care     physician.  DISCHARGE INSTRUCTIONS: 1. Activity:  As tolerated. 2. Diet:  Heart-healthy diet. 3.  Disposition:  Home.     Clydia Llano, MD     ME/MEDQ  D:  12/10/2010  T:  12/10/2010  Job:  [id number]  cc:   Lonzo Cloud. Kriste Basque, MD [address] [place] [address]  Duke Salvia, MD, [facility] [address]  [address] [place] [address]  Electronically Signed by Clydia Llano  on 12/20/2010 06:45:48 PM

## 2010-12-20 NOTE — Assessment & Plan Note (Signed)
Summary: f/u anemia per dr nadel/td   Vital Signs:  Patient profile:   77 year old female Height:      59 inches Weight:      90.13 pounds BMI:     18.27 O2 Sat:      97 % on Room air Temp:     97.4 degrees F oral Pulse rate:   74 / minute BP sitting:   110 / 54  (right arm) Cuff size:   regular  Vitals Entered By: Randell Loop CMA (December 14, 2010 2:28 PM)  O2 Sat at Rest %:  97 O2 Flow:  Room air CC: 4 month ROV & post hosp follow up... Is Patient Diabetic? No Pain Assessment Patient in pain? yes      Onset of pain  abd pain  Comments meds updated today with pt and her daughter   Primary Care Provider:  Alroy Dust  CC:  4 month ROV & post hosp follow up....  History of Present Illness: 77 y/o WF here for a follow up visit... she has multiple medical problems as noted below...    ~  May 05, 2010:  she has a known LIH seen on prev CT scan by [facility]... intermittent bulge in left groin noted previously but recent incr pain & persistant bulge that is tender & hard to reduce... notes pain worse w/ cough, sneeze, straining, etc... exam show firm knot left groin that was difficult to reduce but slowly resolved w/ firm steady pressure while supine>> refer to [facility] for repair before it incarcerates> note- she is on Coumadin for Afib, pacer...    S/P bilat bilat open IH repairs w/ mesh 8/11 by DrBlackman- CXR= cardiomeg, NAD; EKG= pacer rhythm; labs= Hg 11.8, Chem OK...   ~  August 08, 2010:  stable post bilat inguinal hernia repairs... breathing is good- no cough/ phlegm/ ch in SOB; denies CP/ palpit/ edema; saw DrKlein 8/11- stable, pacer OK, no changes made; had CDopplers 9/11- stable mild carotid dis, f/u 73yr; Chol OK on diet Rx; persistant LBP- DrRamos Rx for sp stenosis, on Tramadol, seen by DrYan for Neuro- no neuropathy & no radiculopathy... OK flu shot today.   ~  December 14, 2010:    [name] was hosp 1/21 - 12/10/10 by [facility] w/ RLL pneumonia- NOS & resolved w/  antibiotic Rx; Labs showed Anemia w/ Hg 11.0==>8.1, MCV ~99 & Fe<10, Ferritin=167, B12=581, Folate=11, Protimes w/ INR= 2.1-2.9, & CTAbd w/ bilat rectus musc hematomas (?spontaneous hemorrhage);  Note: 3/11 Hg=12.0, & 8/11 Hg=11.8 (MCV=106);  Stool heme neg here today; last colon check 1995 & neg x divertics; she was disch off Coumadin, on FeSO4, not on PPI, & ret today for f/u labs> feeling weak, tired, congested, sl nausea, no vomiting, vague abd pains, & bruising; Note: Hospitalist & Radiologist didn't feel the rectus hematomas were large enough to account for the anemia (& acute bleed wouldn't lead to Fe<10); she denies any prev hint of bleeding- no melena, BRB, etc.    ** we plan f/u CXR (approaching baseline w/ min resid RLL markings & blunt angle); & LABS/ Anemia eval (Hg improved to 10.5, MCV=109, Fe=78 & 21%sat, PT INR=1.1, BNP=324, stool heme neg, SPE=pending).   Current Problem List:  RESPIRATORY DISORDER, CHRONIC (ICD-519.9) - on MUCINEX 600mg  1-2 Bid... she is a never smoker w/ hx obstructive asthmatic bronchitis over the years treated by DrESL... baseline CXR w/ overinflated lungs, decr markings, enlarged heart, NAD....  ~  CXR  9/11 in hosp showed cardiomeg, NAD.  ~  Adm 1/12 w/ RLL pneumonia- NOS, improved w/ antibiotics back to baseline.  CORONARY ARTERY DISEASE (ICD-414.00) Hx of ATRIAL SEPTAL DEFECT (ICD-745.5) - she has known non-obstructive CAD and is followed by DrCooper... she had an ASD repair at [facility] in 1988... she was on COUMADIN until 1/12 hosp w/ pneumonia> had therapeutic protimes w/ INR= 2.1-2.9 but developed anemia (Hg=8.1), bilat rectus musc hematomas, Fe<10, etc... taken off Coumadin at that point.  ~  last cath 1996 showed 40% Lmain, 30% proxLAD, 30%procRCA, normCIRC...  ~  negative persantine cardiolite 10/97 with EF= 82%...  ~  9/11:  she denies CP, palpit, syncope, change in SOB or edema...  ATRIAL FIBRILLATION (ICD-427.31) PACEMAKER, PERMANENT (ICD-V45.01) -  she is followed by DrKlein prev on Coumadin as noted above... s/p AV junction ablation attempts x2 by DrKlein- now considered permanent AFib & pacer placed 3/05 for SSS, generator changed 12/09.  ~  2DEcho 5/06 showed mild dil LA,  mild AI & AoV sclerosis, mod MR, EF= 65%...  ~  EKG 1/11 showed Pacer rhythm...  ~  seen by DrKlein 8/11- stable, pacer OK, no changes made.  ~  2DEcho 1/12 showed mild LVH, norm wall motion & EF=65-70%, AoV thickened/ calcif/ restricted w/ mean gradient=15, RA & LA dilated, PAsys=79mmHg.  CAROTID BRUIT (ICD-785.9) - she had CDopplers 1996 showing mild irreg plaque bilat, no signif stenosis... she has a bruit on exam-   ~  CDopplers 9/09 showed mild irreg heterogeneous plaque, 0-39% bilat ICA stenoses...  ~  CDoppler 9/10 showed similar findings w/ 0-39% RICA stenosis & 40-59% LICA stenosis... f/u 107yr.  ~  CDoppler 9/11 showed same findings- repeat 70yr.  VENOUS INSUFFICIENCY (ICD-459.81) - she follows a low sodium diet and takes LASIX 40mg /d + MicroK-10 Bid...  HYPERCHOLESTEROLEMIA (ICD-272.0) - now off Vytorin due to muscle symptoms and doing much better... on Omega-3 Fish Oil-   ~  FLP 5/08 on Vytorin10-20 showed TChol 91, TG 47, HDL 41, LDL 41  ~  FLP 10/08 on Vytorin10-20 (1/2 tab daily) showed TChol 139, TG 58, HDL 58, LDL 70  ~  FLP 9/09 off Vytorin x81mo showed TChol 158, TG 38, HDL 46, LDL 104  ~  FLP 3/10 showed TChol 197, TG 51, HDL 57, LDL 129  ~  FLP 3/11 showed TChol 192, TG 86, HDL 70, LDL 105  DIVERTICULOSIS OF COLON (ICD-562.10) - FlexSig 1995 by DrPatterson showed divertics only...  INGUINAL HERNIAs >>> s/p bilat open inguinal hernia repairs w/ mesh by Jean Rosenthal 8/11...  OVERACTIVE BLADDER (ICD-596.51) - she's on DETROL 2mg Bid per Hulda Humphrey...  DEGENERATIVE JOINT DISEASE (ICD-715.90) BACK PAIN, LUMBAR (ICD-724.2) - she has mod LBP and known spinal stenosis... she saw DrSypher in 2008 for osteoarthritis left hand w/ cyst on thumb (surg 10/08)...  on TRAMADOL per DrRamos.  ~  she has severe sp stenosis L4-5, & mod sp stenosis L3-4 >> CT scans of sp & hips showed severe scoliosis w/ advanced DDD & canal & foraminal stenosis, osteophytes, osteopenia, calcif vessels, divertics, sm left inguinal hernia.  ~  3/11:  we tried Duragesic-25 patches for her pain but it made her nauseous, dizzy & loopy so she stopped.  ~  6/11:  she tells me that DrRamos has referred her to Neuro- DrYan> no neuropathy & no radiculopathy by report.  OSTEOPOROSIS (ICD-733.00) - she takes ALENDRONATE 70mg /wk, Calcium, Vits, etc... she has L3 compression.  ~  BMD here 8/08 showed TScores +0.4 in  Spine, and -3.4 in left FemNeck...  ~  BMD here 9/10 showed TScores +0.9 in Spine, and -3.5 in FemNecks  ANEMIA (ICD-285.9) ** SEE ABOVE **   Preventive Screening-Counseling & Management  Alcohol-Tobacco     Smoking Status: never  Allergies: 1)  ! Amiodarone Hcl (Amiodarone Hcl) 2)  ! * Fentanyl 3)  ! Codeine 4)  ! * Tape  Comments:  Nurse/Medical Assistant: The patient's medications and allergies were reviewed with the patient and were updated in the Medication and Allergy Lists.  Past History:  Past Medical History: Hx of PNEUMONIA (ICD-486) RESPIRATORY DISORDER, CHRONIC (ICD-519.9) Hx of CORONARY ARTERY DISEASE (ICD-414.00) ACUTE ON CHRONIC DIASTOLIC HEART FAILURE (ICD-428.33) Hx of ATRIAL SEPTAL DEFECT (ICD-745.5) ATRIAL FIBRILLATION--PERMANENT (ICD-427.31) AV BLOCK, COMPLETE (ICD-426.0) PACEMAKER, PERMANENT-[employer] ZEPHYR 5626 (ICD-V45.01) CAROTID ARTERY DISEASE (ICD-433.10) VENOUS INSUFFICIENCY (ICD-459.81) HYPERCHOLESTEROLEMIA (ICD-272.0) DIVERTICULOSIS OF COLON (ICD-562.10) OVERACTIVE BLADDER (ICD-596.51) DEGENERATIVE JOINT DISEASE (ICD-715.90) BACK PAIN, LUMBAR (ICD-724.2) CHRONIC PAIN SYNDROME (ICD-338.4) OSTEOPOROSIS (ICD-733.00) ANEMIA (ICD-285.9)  Past Surgical History: S/P hysterectomy 1973 S/P cataract surgery S/P ASD repair S/P  pacemaker implant 1996; generator replacement 01/2004- Removal of previously implanted dual-chamber pacemaker and insertion of a new single-chamber pacemaker with pacemaker pocket revision. --[employer] [phone]. S/P bilat inguinal hernia repairs w/ mesh by DrBlackman 8/11  Family History: Reviewed history from 11/03/2008 and no changes required. Negative FH of Diabetes, Hypertension, or Coronary Artery Disease  Social History: Reviewed history from 11/03/2008 and no changes required. Retired  Single  Tobacco Use - No.  Alcohol Use - no  Review of Systems      See HPI       The patient complains of anorexia, decreased hearing, dyspnea on exertion, peripheral edema, prolonged cough, muscle weakness, and difficulty walking.  The patient denies fever, weight loss, weight gain, vision loss, hoarseness, chest pain, syncope, headaches, hemoptysis, abdominal pain, melena, hematochezia, severe indigestion/heartburn, hematuria, incontinence, suspicious skin lesions, transient blindness, depression, unusual weight change, abnormal bleeding, enlarged lymph nodes, and angioedema.    Physical Exam  Additional Exam:  WD, Thin, 77 y/o WF in NAD... appears younger that stated age.  GENERAL:  Alert & oriented; pleasant & cooperative... HEENT:  [place]/AT, EOM-full, PERRLA, EACs-clear, TMs-wnl, NOSE-clear, THROAT-clear & wnl. NECK:  Supple w/ fairROM; no JVD; normal carotid impulses w/ 2+ bruit; no thyromegaly or nodules palpated; no lymphadenopathy. CHEST:  Decr BS bilat, clear to P & A; without wheezes/ rales/ or rhonchi heard... HEART:  Regular Rhythm- pacer; gr 1/6 SEM without rubs or gallops detected... ABDOMEN:  Soft & nontender; bilat inguinal hernia scars; normal bowel sounds; no organomegaly or mases palpated. EXT: without deformities, mod arthritic changes; no varicose veins/ +venous insuffic/ tr edema. NEURO:  CN's intact; no focal neuro deficits... DERM:  No lesions noted; no rash  etc...    Impression & Recommendations:  Problem # 1:  Hx of PNEUMONIA (ICD-486) CXR shows resolution of RLL pneumonia>  there is sl incr markings & min right angle effusion... sed=44 w/ SPE pending. REC to continue Mucinex & Lasix/ KCl...  Problem # 2:  ANEMIA (ICD-285.9) Hg & Fe are improved... stool today was neg for occult blood...  REC continue FeSo4... and continue off Coumadin for now... add Prilosec OTC 20mg /d... Her updated medication list for this problem includes:    Ferro-bob 325 (65 Fe) Mg Tabs (Ferrous sulfate) .Marland Kitchen... Take 1 tablet by mouth two times a day  Problem # 3:  ACUTE ON CHRONIC DIASTOLIC HEART FAILURE (ICD-428.33) Contionue Lasix 40mg /d, along  w/ the KCl. Her updated medication list for this problem includes:    Warfarin Sodium 5 Mg Tabs (Warfarin sodium) .Marland Kitchen... ** hold **    Furosemide 40 Mg Tabs (Furosemide) .Marland Kitchen... Take 1 tablet by mouth once a day or as directed  Problem # 4:  Hx of CORONARY ARTERY DISEASE (ICD-414.00) Cards per DrCooper & DrKlein... Her updated medication list for this problem includes:    Furosemide 40 Mg Tabs (Furosemide) .Marland Kitchen... Take 1 tablet by mouth once a day or as directed  Problem # 5:  ATRIAL FIBRILLATION--PERMANENT (ICD-427.31) Rate controlled, pacer functioning normally, continue off Coumadin for now... Her updated medication list for this problem includes:    Warfarin Sodium 5 Mg Tabs (Warfarin sodium) .Marland Kitchen... ** hold **  Problem # 6:  GI >>> We will add OMEPRAZOLE 20mg /d... with heme neg stools and her frail condition I don't feel much to be gained by agressive GI eval in theis 77 y/o woman...  Problem # 7:  BACK PAIN, LUMBAR (ICD-724.2) Shje is managing satis w/ Tramadol/ Tylenol Prn... Her updated medication list for this problem includes:    Tramadol Hcl 50 Mg Tabs (Tramadol hcl) .Marland Kitchen... As needed back pain.    Tylenol Extra Strength 500 Mg Tabs (Acetaminophen) .Marland Kitchen... As needed with the tramadol for pain  Problem # 8:  OTHER  MEDICAL PROBLEMS AS NOTED>>>  Complete Medication List: 1)  Mucinex 600 Mg Xr12h-tab (Guaifenesin) .... Every 12 hours 2)  Warfarin Sodium 5 Mg Tabs (Warfarin sodium) .... ** hold ** 3)  Furosemide 40 Mg Tabs (Furosemide) .... Take 1 tablet by mouth once a day or as directed 4)  Micro-k 10 Meq Cpcr (Potassium chloride) .... Take 1 tablet by mouth two times a day or as directed 5)  Omega-3 350 Mg Caps (Omega-3 fatty acids) .... Take 1 tablet by mouth once a day 6)  Detrol La 2 Mg Cp24 (Tolterodine tartrate) .... Take one tablet two times a day 7)  Tramadol Hcl 50 Mg Tabs (Tramadol hcl) .... As needed back pain. 8)  Tylenol Extra Strength 500 Mg Tabs (Acetaminophen) .... As needed with the tramadol for pain 9)  Glucosamine 500 Mg Caps (Glucosamine sulfate) .Marland Kitchen.. 1 by mouth daily 10)  Alendronate Sodium 70 Mg Tabs (Alendronate sodium) .... Take one tablet by mouth every week 11)  Calcium 600/vitamin D 600-400 Mg-unit Tabs (Calcium carbonate-vitamin d) .... Take 2  tablet by mouth once a day 12)  Biotin 1000 Mcg Tabs (Biotin) .... Take 1 tablet by mouth once a day 13)  Vitamin C 1000 Mg Tabs (Ascorbic acid) .... Take 1 tablet by mouth once a day 14)  Vitamin D 1000 Unit Tabs (Cholecalciferol) .... Take 1 tablet by mouth once a day 15)  Vitamin E 400 Unit Caps (Vitamin e) .... Take 1 tablet by mouth once a day 16)  Ferro-bob 325 (65 Fe) Mg Tabs (Ferrous sulfate) .... Take 1 tablet by mouth two times a day 17)  Prilosec Otc 20 Mg Tbec (Omeprazole magnesium) .... Take 1 tab by mouth once daily- 30 min before the 1st meal of the day...  Other Orders: T-2 View CXR (71020TC) TLB-BMP (Basic Metabolic Panel-BMET) (80048-METABOL) TLB-Hepatic/Liver Function Pnl (80076-HEPATIC) TLB-CBC Platelet - w/Differential (85025-CBCD) TLB-IBC Pnl (Iron/FE;Transferrin) (83550-IBC) TLB-BNP (B-Natriuretic Peptide) (83880-BNPR) TLB-Sedimentation Rate (ESR) (85652-ESR) T-SPE w/reflex to IFE (21308-65784) TLB-PT  (Protime) (85610-PTP)  Patient Instructions: 1)  Today we updated your med list- see below.... 2)  Continue your current meds the same for now... 3)  Today we reviewed in detail the recent medical events... 4)  We did a follow up CXR & blood work...  5)  we will call you w/ these results tomorrow afternoon & discuss further evaluation &  treatment...   Orders Added: 1)  Est. Patient Level V [99215] 2)  T-2 View CXR [71020TC] 3)  TLB-BMP (Basic Metabolic Panel-BMET) [80048-METABOL] 4)  TLB-Hepatic/Liver Function Pnl [80076-HEPATIC] 5)  TLB-CBC Platelet - w/Differential [85025-CBCD] 6)  TLB-IBC Pnl (Iron/FE;Transferrin) [83550-IBC] 7)  TLB-BNP (B-Natriuretic Peptide) [83880-BNPR] 8)  TLB-Sedimentation Rate (ESR) [85652-ESR] 9)  T-SPE w/reflex to IFE [11914-78295] 10)  TLB-PT (Protime) [85610-PTP]

## 2010-12-20 NOTE — Progress Notes (Signed)
Summary: swelling feet and legs and sob  Phone Note Call from Patient Call back at [phone] or [phone]   Caller: Daughter/  Daine  Summary of Call: Pt swelling in feet and legs and sob Initial call taken by: Judie Grieve,  December 11, 2010 1:30 PM  Follow-up for Phone Call        Patient just discharged from [facility] with pneumonia. She is having swelling in her abdominal area and LE edema. She is also having SOB. I have added her to [facility] schedule tomorrow when  Dr. Graciela Husbands is in the office. She is taking her Lasix as prescribed. Advised her to go to the [facility] ER if her SOB gets worse. She agrees. Whitney Maeola Sarah RN  December 11, 2010 1:58 PM  Follow-up by: Whitney Maeola Sarah RN,  December 11, 2010 1:58 PM  Additional Follow-up for Phone Call Additional follow up Details #1::        Gurbani Figge seen in office today.  Additional Follow-up by: [name] PA-C,  December 12, 2010 1:25 PM

## 2010-12-20 NOTE — Assessment & Plan Note (Signed)
Summary: eph/wpa   Visit Type:  Post-hospital Primary Provider:  Alroy Dust  CC:  Sob- Swelling abdominal area and legs.  History of Present Illness: Primary Electrophysiologist:  Dr. Sherryl Manges  [name] is a 77 yo female with permanent AFib, s/p pacer 2/2 CHB, nonobs CAD by cath 1997, chronic diastolic CHF.  She was admitted to [facility] 1/21-1/29 with RLL pneumonia.  She did require some diuresis while in the hospital.  She developed SIADH but her Na corrected by discharge.  She complained of abdominal pain and a CT demonstrated possible bilat. inferior rectus sheath hematoma (R>L measuring 3.5 cm).  This was felt to be small.  She was noted to be anemic with iron deficiency.  The notes indicate that the radiologist did not feel the hematoma was large enough to account for significant anemia.  In any event, it was recommended she have her coumadin held for 2 weeks and have follow up prior to restarting.  Echo done 12/03/10 demonstrated EF 65-70%; mild LVH; restricted AV motion with mean gradient of 15 mmHg; mod MR; mod-severe LAE and mod RAE; mod TR; mod PR; PASP 60.  She returns for follow up.  Labs: Na 136, K 3.6, Creat 0.60 (12/10/10) Hgb 8.1 (12/10/10)  She developed increasing lower extremity edema and shortness of breath yesterday.  She called in for a sooner appointment.  She took 2 Lasix yesterday.  She feels better today.  Her swelling is down and her breathing is somewhat better.  She still feels somewhat full in her stomach.  She still having some abdominal pain.  She's noted some bruising as well.  Her right side is worse than the left which is consistent with the finding on her CT scan (hematoma larger on the right than the left).  She denies chest pain.  She denies syncope.  Current Medications (verified): 1)  Warfarin Sodium 5 Mg Tabs (Warfarin Sodium) .... Use As Directed By Anticoagulation Clinic. On Hold 2)  Furosemide 40 Mg  Tabs (Furosemide) .... Pt Takes 2 Tablets Daily.   She Adjusts This To Fluid Intake 3)  Micro-K 10 Meq  Cpcr (Potassium Chloride) .... Take One Tablet Two Times A Day 4)  Omega-3 350 Mg Caps (Omega-3 Fatty Acids) .... Take 1 Tablet By Mouth Once A Day 5)  Detrol La 2 Mg  Cp24 (Tolterodine Tartrate) .... Take One Tablet Two Times A Day 6)  Alendronate Sodium 70 Mg Tabs (Alendronate Sodium) .... Take One Tablet By Mouth Every Week 7)  Calcium 600/vitamin D 600-400 Mg-Unit Tabs (Calcium Carbonate-Vitamin D) .... Take 2  Tablet By Mouth Once A Day 8)  Vitamin D 1000 Unit Tabs (Cholecalciferol) .... Take 1 Tablet By Mouth Once A Day 9)  Biotin 1000 Mcg  Tabs (Biotin) .... Take 1 Tablet By Mouth Once A Day 10)  Tramadol Hcl 50 Mg Tabs (Tramadol Hcl) .... As Needed Back Pain. 11)  Tylenol Extra Strength 500 Mg Tabs (Acetaminophen) .... As Needed With The Tramadol For Pain 12)  Vitamin C 1000 Mg Tabs (Ascorbic Acid) .... Take 1 Tablet By Mouth Once A Day 13)  Vitamin E 400 Unit Caps (Vitamin E) .... Take 1 Tablet By Mouth Once A Day 14)  Glucosamine 500 Mg Caps (Glucosamine Sulfate) .Marland Kitchen.. 1 By Mouth Daily 15)  Vitamin A 8000 .Marland Kitchen.. 1 By Mouth Every Other Day 16)  Ferro-Bob 325 (65 Fe) Mg Tabs (Ferrous Sulfate) .... Take 1 Tablet By Mouth Two Times A Day 17)  Mucinex 600  Mg Xr12h-Tab (Guaifenesin) .... Every 12 Hours 18)  Robitussin Chest Congestion 100 Mg/27ml Syrp (Guaifenesin) .... As Needed  Allergies: 1)  ! Amiodarone Hcl (Amiodarone Hcl) 2)  ! * Fentanyl 3)  ! Codeine 4)  ! * Tape  Past History:  Past Medical History: Last updated: 08/08/2010 RESPIRATORY DISORDER, CHRONIC (ICD-519.9) Hx of CORONARY ARTERY DISEASE (ICD-414.00) Hx of ATRIAL SEPTAL DEFECT (ICD-745.5) PAROXYSMAL ATRIAL FIBRILLATION (ICD-427.31) AV BLOCK, COMPLETE (ICD-426.0) PACEMAKER, PERMANENT (ICD-V45.01)-[employer] 5626 CAROTID BRUIT (ICD-785.9) VENOUS INSUFFICIENCY (ICD-459.81) HYPERCHOLESTEROLEMIA (ICD-272.0) DIVERTICULOSIS OF COLON (ICD-562.10) OVERACTIVE  BLADDER (ICD-596.51) DEGENERATIVE JOINT DISEASE (ICD-715.90) BACK PAIN, LUMBAR (ICD-724.2) OSTEOPOROSIS (ICD-733.00)  Review of Systems       She is passing gas and having normal bowel movements.  She denies vomiting.  Otherwise, as per  the HPI.  All other systems reviewed and negative.   Vital Signs:  Patient profile:   77 year old female Height:      59 inches Weight:      94.75 pounds BMI:     19.21 Pulse rate:   70 / minute Pulse rhythm:   regular Resp:     18 per minute BP sitting:   120 / 50  (right arm) Cuff size:   large  Vitals Entered By: Vikki Ports (December 12, 2010 11:21 AM) CC: Sob- Swelling abdominal area and legs   Physical Exam  General:  Well nourished, well developed, in no acute distress HEENT: normal Neck: no JVD Cardiac:  normal S1, S2; RRR; 2/6 systolic murmur Lungs:  crackles at the right base; o/w clear Abd: soft, RLQ tenderness; + ecchymosis noted Ext: 1-2 + bilat ankle edema Skin: warm and dry Neuro:  CNs 2-12 intact, no focal abnormalities noted    EKG  Procedure date:  12/12/2010  Findings:      probable underlying atrial fibrillation Ventricular rate 70 Ventricular pacing  PPM Specifications Following MD:  Sherryl Manges, MD     PPM Vendor:  [employer]     PPM Model Number:  [id number]     PPM Serial Number:  [id number] PPM DOI:  11/11/2008     PPM Implanting MD:  Lewayne Bunting, MD  Lead 1    Location: [facility]     DOI: 03/05/1995     Model #: [id number]     Serial #: [id number]     Status: capped Lead 2    Location: [facility]     DOI: 03/05/1995     Model #: [id number]     Serial #: [id number]     Status: active   Indications:  CHRONIC AFIB   PPM Follow Up Pacer Dependent:  Yes      Episodes Coumadin:  Yes  Parameters Mode:  VVIR     Lower Rate Limit:  70     Upper Rate Limit:  110 Rate Response Parameters:  Threshold-Auto (-0.5), Slope-9, Reaction time-Fast, Recovery time-Slow  Impression & Recommendations:  Problem # 1:  ATRIAL FIBRILLATION--PERMANENT  (ICD-427.31) Her Coumadin is currently on hold.  At this point, with her hematoma and anemia I do not believe we should automatically restart her Coumadin in 2 weeks.  I think she needs further work up of her anemia.  I reviewed her case with  Dr. Graciela Husbands.  I will have her see him back in the next 2-3 weeks to discuss whether or not to restart Coumadin.  She should not start taking Aspirin.  Orders: EKG w/ Interpretation (93000)  Problem # 2:  Rectus Sheath  Hematoma I question whether or not this was caused by her coughing with her pneumonia.  As above, I will bring her back in close follow up with Dr. Graciela Husbands to discuss whether or not to restart Coumadin.  Problem # 3:  ANEMIA (ICD-285.9) She has been placed on iron therapy.  Hospital records indicate that it was not felt that her hematoma was large enough to contribute to her anemia.  Her Hgb was 8.1 at discharge.  She has been placed on iron.  I think she needs further evaluation for her anemia.  I will try to get her back in to see her primary care provider as soon as possible for further evaluation.  Her anemia workup will need to be completed prior to her restarting coumadin, should we decide to do this.  Problem # 4:  ACUTE ON CHRONIC DIASTOLIC HEART FAILURE (ICD-428.33) I have recommended she increase her Lasix to 40 mg twice a day for 2 more days.  She should also increase her potassium to 20 mEq b.i.d. for 2 more days.  Thereafter, she should resume Lasix 40 mg a day and potassium 10 mEq twice a day.  If she has further problems with edema or shortness of breath she should contact our office for further instructions.  Problem # 5:  Pneumonia This is slowly improving.    Patient Instructions: 1)  Your physician recommends that you schedule a follow-up appointment in: 01/08/11 @ 4:30 with Dr. Graciela Husbands to discuss coumadin as per [name], PA-C. 2)  Your physician recommends that you continue on your current medications as directed. Please  refer to the Current Medication list given to you today. Increase lasix 40 mg two times a day x2 days as well as increase potassium 10 mEq take 2 tabs two times a day x 2 days, after the 2 days you are to resume these 2 medications to your previous dose as per [name], PA-C.  Prescriptions: MICRO-K 10 MEQ  CPCR (POTASSIUM CHLORIDE) 2 tabs two times a day for 2 days then resume previous dose as instructed as per [name], PA-C  #60 x 11   Entered by:   Danielle Rankin, CMA   Authorized by:   [name] PA-C   Signed by:   Danielle Rankin, CMA on 12/12/2010   Method used:   Electronically to        [facility] [address] (retail)       [phone]       [place], [place]  [address]       Ph: [phone]       Fax: [phone]   RxID:   [id number] FUROSEMIDE 40 MG  TABS (FUROSEMIDE) pt takes 2 tablets two times a day for 2 days then resume previous dose as instructed as per [name], PA-C  #60 x 11   Entered by:   Danielle Rankin, CMA   Authorized by:   [name] PA-C   Signed by:   Danielle Rankin, CMA on 12/12/2010   Method used:   Electronically to        [facility] [address] (retail)       [phone]       [place], [place]  [address]       Ph: [phone]       Fax: [phone]   RxID:   [id number]  I called the  pharmacy to correct the directions on the furosemide.  She is to take 1 by mouth two times a day for 2 days, then resume 1 by mouth once daily. [name] PA-C  December 12, 2010 1:35 PM

## 2010-12-20 NOTE — Progress Notes (Signed)
Summary: wants pt to see nadel today--[facility] cardiology no answer  Phone Note From Other Clinic Call back at Home Phone [phone]   Caller: cardiology  Call For: nadel Summary of Call: pa weaver wants pt seen today for anemia hemoglobin was 8.1 on 12/10/2010 Initial call taken by: Lacinda Axon,  December 12, 2010 12:20 PM  Follow-up for Phone Call        at[facility] cardiology to find out more info but no one answered. Will try back after lunch [name]  December 12, 2010 12:30 PM   Additional Follow-up for Phone Call Additional follow up Details #1::        Called and spoke with [name] in cardiology.  She states that pt was recently d/c'ed from the hospital.  She states that pt was seen there today and her HGB is 8.1 and [name], PA wanted to try to get pt in to see SN today.  The pt's daughter is off work today so she could have a way to get here.  Will forward to SN.  Pls advise thanks Additional Follow-up by: [name],  December 12, 2010 2:25 PM    Additional Follow-up for Phone Call Additional follow up Details #2::    spoke with dr Kriste Basque, add pt on to schedule for thurs or fri of this week. spoke to [name] and made sure the pt was off coumadin and taking iron twice a day everyday --ov scheduled for thursday at 2:30--pt needs cxr and labs per dr nadel Follow-up by: Philipp Deputy CMA,  December 12, 2010 4:08 PM

## 2010-12-26 DIAGNOSIS — I4891 Unspecified atrial fibrillation: Secondary | ICD-10-CM | POA: Insufficient documentation

## 2010-12-28 NOTE — Progress Notes (Signed)
Summary: question about medication  Phone Note Call from Patient Call back at [phone] or [phone]   Caller: Daughter/ Diane Summary of Call: Question about pt medication Lasix Initial call taken by: Judie Grieve,  December 18, 2010 2:25 PM  Follow-up for Phone Call        Phone Call Completed DAUGHTER AWARE HAVE  MOM TAKE LASIX 40 MG 1 once daily AND KCL 10 MEQ 2 TABS once daily  THAT WAS ORIGINAL DOSAGE PER DAUGHTER. SEE LAST OFFICE NOTE MEDS WERE IN CREASED X 2 DAYS. Follow-up by: Scherrie Bateman, LPN,  December 18, 2010 4:00 PM

## 2011-01-08 ENCOUNTER — Ambulatory Visit (INDEPENDENT_AMBULATORY_CARE_PROVIDER_SITE_OTHER): Payer: Medicare Other | Admitting: Internal Medicine

## 2011-01-08 ENCOUNTER — Encounter: Payer: Self-pay | Admitting: Internal Medicine

## 2011-01-08 DIAGNOSIS — I4891 Unspecified atrial fibrillation: Secondary | ICD-10-CM

## 2011-01-08 DIAGNOSIS — I5032 Chronic diastolic (congestive) heart failure: Secondary | ICD-10-CM | POA: Insufficient documentation

## 2011-01-08 DIAGNOSIS — E871 Hypo-osmolality and hyponatremia: Secondary | ICD-10-CM

## 2011-01-08 DIAGNOSIS — I442 Atrioventricular block, complete: Secondary | ICD-10-CM

## 2011-01-12 ENCOUNTER — Encounter (INDEPENDENT_AMBULATORY_CARE_PROVIDER_SITE_OTHER): Payer: Self-pay | Admitting: *Deleted

## 2011-01-12 ENCOUNTER — Other Ambulatory Visit (INDEPENDENT_AMBULATORY_CARE_PROVIDER_SITE_OTHER): Payer: Medicare Other

## 2011-01-12 ENCOUNTER — Other Ambulatory Visit: Payer: Self-pay | Admitting: Internal Medicine

## 2011-01-12 ENCOUNTER — Other Ambulatory Visit: Payer: Medicare Other

## 2011-01-12 DIAGNOSIS — R0989 Other specified symptoms and signs involving the circulatory and respiratory systems: Secondary | ICD-10-CM

## 2011-01-12 DIAGNOSIS — R7881 Bacteremia: Secondary | ICD-10-CM

## 2011-01-12 DIAGNOSIS — R0602 Shortness of breath: Secondary | ICD-10-CM

## 2011-01-12 LAB — CBC WITH DIFFERENTIAL/PLATELET
Eosinophils Relative: 3.1 % (ref 0.0–5.0)
HCT: 32.4 % — ABNORMAL LOW (ref 36.0–46.0)
Hemoglobin: 10.9 g/dL — ABNORMAL LOW (ref 12.0–15.0)
Lymphs Abs: 1.5 10*3/uL (ref 0.7–4.0)
MCV: 106.5 fl — ABNORMAL HIGH (ref 78.0–100.0)
Monocytes Absolute: 0.4 10*3/uL (ref 0.1–1.0)
Monocytes Relative: 8.8 % (ref 3.0–12.0)
Neutro Abs: 3 10*3/uL (ref 1.4–7.7)
RDW: 14.3 % (ref 11.5–14.6)
WBC: 5.1 10*3/uL (ref 4.5–10.5)

## 2011-01-18 NOTE — Assessment & Plan Note (Signed)
Summary: as per [name] PA-C needs to see Dr. Graciela Husbands about coumadi...   Visit Type:  Follow-up Primary Provider:  Alroy Dust  CC:  no complaints.  History of Present Illness: [name]  is seen in followup for   permanent atrial fibrillation. She is status post AV junction on 2 separate occasions and has a previously implanted pacemaker. She was admitted to [facility] 1/21-1/29 with RLL pneumonia.  She did require some diuresis while in the hospital.  She developed SIADH but her Na corrected by discharge.  She complained of abdominal pain and a CT demonstrated possible bilat. inferior rectus sheath hematoma (R>L measuring 3.5 cm).  This was felt to be small.  She was noted to be anemic with iron deficiency.  The notes indicate that the radiologist did not feel the hematoma was large enough to account for significant anemia.  In any event, it was recommended she have her coumadin held for 2 weeks and have follow up prior to restarting.  Echo done 12/03/10 demonstrated EF 65-70%; mild LVH; restricted AV motion with mean gradient of 15 mmHg; mod MR; mod-severe LAE and mod RAE; mod TR; mod PR; PASP 60.  Na 136, K 3.6, Creat 0.60 (12/10/10) Hgb 8.1 (12/10/10)  repeat labs demonstrate a hemoglobin of over 10; the sodium was relatively stable at 130  She saw [name] a few weeks ago because of lower extremity edema and shortness of breath. This responded to incremental diuresisand she adjusts her diuretics as needed for edema.  She still however does not feel great. She remains off of her Coumadin because of the hematoma bleeding.  Current Medications (verified): 1)  Warfarin Sodium 5 Mg Tabs (Warfarin Sodium) .... ** Hold ** 2)  Furosemide 40 Mg  Tabs (Furosemide) .... Take 1 Tablet By Mouth Once A Day or As Directed 3)  Micro-K 10 Meq  Cpcr (Potassium Chloride) .... Take 1 Tablet By Mouth Two Times A Day or As Directed 4)  Omega-3 350 Mg Caps (Omega-3 Fatty Acids) .... Take 1 Tablet By Mouth  Once A Day 5)  Detrol La 2 Mg  Cp24 (Tolterodine Tartrate) .... Take One Tablet Two Times A Day 6)  Tramadol Hcl 50 Mg Tabs (Tramadol Hcl) .... As Needed Back Pain. 7)  Tylenol Extra Strength 500 Mg Tabs (Acetaminophen) .... As Needed With The Tramadol For Pain 8)  Glucosamine 500 Mg Caps (Glucosamine Sulfate) .Marland Kitchen.. 1 By Mouth Daily 9)  Alendronate Sodium 70 Mg Tabs (Alendronate Sodium) .... Take One Tablet By Mouth Every Week 10)  Calcium 600/vitamin D 600-400 Mg-Unit Tabs (Calcium Carbonate-Vitamin D) .... Take 2  Tablet By Mouth Once A Day 11)  Biotin 1000 Mcg  Tabs (Biotin) .... Take 1 Tablet By Mouth Once A Day 12)  Vitamin C 1000 Mg Tabs (Ascorbic Acid) .... Take 1 Tablet By Mouth Once A Day 13)  Vitamin D 1000 Unit Tabs (Cholecalciferol) .... Take 1 Tablet By Mouth Once A Day 14)  Vitamin E 400 Unit Caps (Vitamin E) .... Take 1 Tablet By Mouth Once A Day 15)  Ferro-Bob 325 (65 Fe) Mg Tabs (Ferrous Sulfate) .... Take 1 Tablet By Mouth Two Times A Day 16)  Prilosec Otc 20 Mg Tbec (Omeprazole Magnesium) .... Take 1 Tab By Mouth Once Daily- 30 Min Before The 1st Meal of The Day... 17)  Vitamin A 8000 Mg .Marland Kitchen.. 1 Every Other Day  Allergies (verified): 1)  ! Amiodarone Hcl (Amiodarone Hcl) 2)  ! * Fentanyl 3)  !  Codeine 4)  ! * Tape  Vital Signs:  Patient profile:   77 year old female Height:      59 inches Weight:      93 pounds Pulse rate:   75 / minute BP sitting:   118 / 50  (left arm) Cuff size:   regular  Vitals Entered By: Hardin Negus, RMA (January 08, 2011 4:42 PM)  Physical Exam  General:  Well nourished, well developed, in no acute distress HEENT: normal Neck: no JVD Cardiac:  normal S1, S2; RRR; 2/6 systolic murmur Lungs:  crackles at the right base; o/w clear Abd: soft, RLQ tenderness; + ecchymosis noted [id number] Skin: warm and dry Neuro:  CNs 2-12 intact, no focal abnormalities notedexcept for decreased hearing alert and oriented and grossly normal  motor and sensory function   PPM Specifications Following MD:  Sherryl Manges, MD     PPM Vendor:  [employer]     PPM Model Number:  [id number]     PPM Serial Number:  [id number] PPM DOI:  11/11/2008     PPM Implanting MD:  Lewayne Bunting, MD  Lead 1    Location: [facility]     DOI: 03/05/1995     Model #: [id number]     Serial #: [id number]     Status: capped Lead 2    Location: [facility]     DOI: 03/05/1995     Model #: 1346T     Serial #: [id number]     Status: active   Indications:  CHRONIC AFIB   PPM Follow Up Pacer Dependent:  Yes      Episodes Coumadin:  Yes  Parameters Mode:  VVIR     Lower Rate Limit:  70     Upper Rate Limit:  110 Rate Response Parameters:  Threshold-Auto (-0.5), Slope-9, Reaction time-Fast, Recovery time-Slow  Impression & Recommendations:  Problem # 1:  ATRIAL FIBRILLATION--PERMANENT (ICD-427.31) She is currently off of warfarin because of the rectus sheath bleeding. We'll need to make a decision subtly about resumption. It would be nice if apixoban were available as his bleeding risks seem to be considerably less than with warfarin  Her updated medication list for this problem includes:    Warfarin Sodium 5 Mg Tabs (Warfarin sodium) .Marland Kitchen... ** hold **  Problem # 2:  DIASTOLIC HEART FAILURE, CHRONIC (ICD-428.32)  stable we will continue her on her diuretics and she will adjust them at home as necessary Her updated medication list for this problem includes:    Warfarin Sodium 5 Mg Tabs (Warfarin sodium) .Marland Kitchen... ** hold **    Furosemide 40 Mg Tabs (Furosemide) .Marland Kitchen... Take 1 tablet by mouth once a day or as directed  Her updated medication list for this problem includes:    Warfarin Sodium 5 Mg Tabs (Warfarin sodium) .Marland Kitchen... ** hold **    Furosemide 40 Mg Tabs (Furosemide) .Marland Kitchen... Take 1 tablet by mouth once a day or as directed  Problem # 3:  AV BLOCK, COMPLETE (ICD-426.0)  stable post pacing  Her updated medication list for this problem includes:    Warfarin Sodium 5 Mg Tabs (Warfarin sodium)  .Marland Kitchen... ** hold **  Problem # 4:  ANEMIA (ICD-285.9) we will recheck her hemoglobin which was most recently above 10  Problem # 5:  HYPONATREMIA (ICD-276.1) She developed hyponatremia and hospital the setting of pneumonia. It was described as SIADH. Surprisingly however, her sodium fell from 136-13 over /3 days at the beginning of February. Because of that  we will plan to recheck her sodium. This would be important both to exclude recurrent hyponatremia and is present to try to understand what the mechanism is by which is occurring.  Patient Instructions: 1)  Your physician recommends that you continue on your current medications as directed. Please refer to the Current Medication list given to you today. 2)  Your physician recommends that you return for lab work [id number] WEEK AT [facility] BMET CBC  786.05  790.7 3)  Your physician recommends that you schedule a follow-up appointment in: 3 MON THS WITH DR Graciela Husbands

## 2011-01-18 NOTE — Cardiovascular Report (Signed)
Summary: Office Visit   Office Visit   Imported By: Roderic Ovens 01/11/2011 16:12:21  _____________________________________________________________________  External Attachment:    Type:   Image     Comment:   External Document

## 2011-01-26 LAB — BASIC METABOLIC PANEL
BUN: 17 mg/dL (ref 6–23)
CO2: 34 mEq/L — ABNORMAL HIGH (ref 19–32)
Chloride: 99 mEq/L (ref 96–112)
Creatinine, Ser: 0.79 mg/dL (ref 0.4–1.2)

## 2011-01-26 LAB — CBC
MCH: 35.3 pg — ABNORMAL HIGH (ref 26.0–34.0)
MCHC: 33.3 g/dL (ref 30.0–36.0)
MCV: 106 fL — ABNORMAL HIGH (ref 78.0–100.0)
Platelets: 178 10*3/uL (ref 150–400)
RDW: 12.9 % (ref 11.5–15.5)

## 2011-01-26 LAB — PROTIME-INR: Prothrombin Time: 14.9 seconds (ref 11.6–15.2)

## 2011-02-06 ENCOUNTER — Encounter: Payer: Self-pay | Admitting: Pulmonary Disease

## 2011-02-08 ENCOUNTER — Telehealth: Payer: Self-pay | Admitting: Pulmonary Disease

## 2011-02-09 ENCOUNTER — Other Ambulatory Visit (INDEPENDENT_AMBULATORY_CARE_PROVIDER_SITE_OTHER): Payer: Medicare Other

## 2011-02-09 ENCOUNTER — Ambulatory Visit (INDEPENDENT_AMBULATORY_CARE_PROVIDER_SITE_OTHER): Payer: Medicare Other | Admitting: Pulmonary Disease

## 2011-02-09 ENCOUNTER — Encounter: Payer: Self-pay | Admitting: Pulmonary Disease

## 2011-02-09 DIAGNOSIS — I6529 Occlusion and stenosis of unspecified carotid artery: Secondary | ICD-10-CM

## 2011-02-09 DIAGNOSIS — M545 Low back pain, unspecified: Secondary | ICD-10-CM

## 2011-02-09 DIAGNOSIS — Q2111 Secundum atrial septal defect: Secondary | ICD-10-CM

## 2011-02-09 DIAGNOSIS — I2510 Atherosclerotic heart disease of native coronary artery without angina pectoris: Secondary | ICD-10-CM

## 2011-02-09 DIAGNOSIS — E7800 Pure hypercholesterolemia, unspecified: Secondary | ICD-10-CM

## 2011-02-09 DIAGNOSIS — Q211 Atrial septal defect: Secondary | ICD-10-CM

## 2011-02-09 DIAGNOSIS — I509 Heart failure, unspecified: Secondary | ICD-10-CM

## 2011-02-09 DIAGNOSIS — I5032 Chronic diastolic (congestive) heart failure: Secondary | ICD-10-CM

## 2011-02-09 DIAGNOSIS — M1990 Unspecified osteoarthritis, unspecified site: Secondary | ICD-10-CM

## 2011-02-09 DIAGNOSIS — D649 Anemia, unspecified: Secondary | ICD-10-CM

## 2011-02-09 DIAGNOSIS — K5730 Diverticulosis of large intestine without perforation or abscess without bleeding: Secondary | ICD-10-CM

## 2011-02-09 DIAGNOSIS — J989 Respiratory disorder, unspecified: Secondary | ICD-10-CM

## 2011-02-09 LAB — CBC WITH DIFFERENTIAL/PLATELET
Basophils Absolute: 0 10*3/uL (ref 0.0–0.1)
Eosinophils Absolute: 0.1 10*3/uL (ref 0.0–0.7)
Eosinophils Relative: 1.6 % (ref 0.0–5.0)
Lymphs Abs: 1.5 10*3/uL (ref 0.7–4.0)
MCHC: 33.9 g/dL (ref 30.0–36.0)
MCV: 106.2 fl — ABNORMAL HIGH (ref 78.0–100.0)
Monocytes Absolute: 0.4 10*3/uL (ref 0.1–1.0)
Neutrophils Relative %: 66.1 % (ref 43.0–77.0)
Platelets: 147 10*3/uL — ABNORMAL LOW (ref 150.0–400.0)
RDW: 14.3 % (ref 11.5–14.6)
WBC: 5.9 10*3/uL (ref 4.5–10.5)

## 2011-02-09 LAB — HEPATIC FUNCTION PANEL
ALT: 14 U/L (ref 0–35)
AST: 53 U/L — ABNORMAL HIGH (ref 0–37)
Alkaline Phosphatase: 36 U/L — ABNORMAL LOW (ref 39–117)
Total Bilirubin: 0.3 mg/dL (ref 0.3–1.2)

## 2011-02-09 LAB — BASIC METABOLIC PANEL
BUN: 17 mg/dL (ref 6–23)
Chloride: 101 mEq/L (ref 96–112)
Potassium: 4.7 mEq/L (ref 3.5–5.1)
Sodium: 138 mEq/L (ref 135–145)

## 2011-02-09 LAB — IBC PANEL
Iron: 82 ug/dL (ref 42–145)
Transferrin: 261.9 mg/dL (ref 212.0–360.0)

## 2011-02-09 NOTE — Progress Notes (Signed)
Subjective:    Patient ID: [name], female    DOB: 1919/10/03, 77 y.o.   MRN: [id number]  HPI 77 y/o WF here for a follow up visit... she has multiple medical problems including:  Hx Asthmatic bronchitis/ Pneumonia;  CAD & ASD repair;  AFib & Pacemaker;  Carotid Art dis;  Ven Insuffic;  Hyperchol;  Divertics;  s/p bilat inguinal hernia repairs 8/11;  Overactive bladder;  DJD/ LBP/ Osteoporosis;  Anemia & bilat rectus muscle hematomas 1/12...   ~  December 14, 2010:  Dema was hosp 1/21 - 12/10/10 by [facility] w/ RLL pneumonia- NOS & resolved w/ antibiotic Rx; Labs showed Anemia w/ Hg 11.0==>8.1, MCV~99 & Fe<10, Ferritin=167, B12=581, Folate=11, Protimes w/ INR= 2.1-2.9, & CTAbd w/ bilat rectus musc hematomas (?spontaneous hemorrhage);  Note: 3/11 Hg=12.0, & 8/11 Hg=11.8 (MCV=106);  Stool heme neg here today; last colon check 1995 & neg x divertics; she was disch off Coumadin, on FeSO4, not on PPI, & ret today for f/u labs> feeling weak, tired, congested, sl nausea, no vomiting, vague abd pains, & bruising; Note: Hospitalist & Radiologist didn't feel the rectus hematomas were large enough to account for the anemia (& acute bleed wouldn't lead to Fe<10); she denies any prev hint of bleeding- no melena, BRB, etc.    We plan f/u CXR (approaching baseline w/ min resid RLL markings & blunt angle); & LABS/ Anemia eval (Hg improved to 10.5, MCV=109, Fe=78 & 21%sat, PT INR=1.1, BNP=324, stool heme neg, SPE=neg).  ~  February 09, 2011:  2 month ROV & stable but she has persist GI symptoms- vague discomfort, swelling, constip "there's something there" she says> offered GI referral (she wants to hold off) vs incr laxatives w/ Miralax & Senakot-S to establish soft easy to pass BMs;  We added Omep 20mg /d last OV but she stopped it on her own & doesn't want to take it;  She saw DrKlein 2/12 for f/u AFib/ pacer, he reviewed her Echo from 1/12 & decided to leave the Coumadin off for now w/ rov in 26mo...    Resp>  She has  Mucinex for Prn use but not taking any regular breathing meds...    Cards>  She has CAD, Hx ASD repair, AFib, Pacer, Carotid dis, pulmHTN;  Coumadin on HOLD & taking Lasix40mg /d w/ K10Bid;  2DEcho 1/12 showed EF 65-70%, mild LVH, restricted AV motion with mean gradient of 15 mmHg, mod MR, mod-severe LAE and mod RAE, mod TR, mod PR, PASP 60...    Chol>  On Fish Oil supplements & last FLP 3/11 looked pretty good;  Her wt is stable at 92 lbs...     Ortho>  This has been her biggest complaint- DJD, LBP, known sp stenosis, L3 compression, osteoporosis;  She remains on Alendronate, Calcium, MVI, Vit D, & uses Tramadol & Tylenol for pain...   Past Medical History  Diagnosis Date  . Pneumonia, organism unspecified   . Unspecified disease of respiratory system   . CAD (coronary artery disease)   . Ostium secundum type atrial septal defect   . AV block   . Presence of permanent cardiac pacemaker   . Venous insufficiency   . Pure hypercholesterolemia   . Diverticulosis of colon (without mention of hemorrhage)   . Overactive bladder   . DJD (degenerative joint disease)   . LBP (low back pain)   . Chronic pain syndrome   . Osteoporosis   . Anemia     Past Surgical History  Procedure Date  . Vesicovaginal fistula closure w/ tah 1973  . Cataract extraction   . Asd repair   . Pacemaker placement 1996    St Jude  . Inguinal hernia repair 2011    bilaterally    Outpatient Encounter Prescriptions as of 02/09/2011  Medication Sig Dispense Refill  . acetaminophen (TYLENOL) 500 MG tablet Take 500 mg by mouth every 6 (six) hours as needed. Take with tramadol as needed for pain       . alendronate (FOSAMAX) 70 MG tablet Take 70 mg by mouth every 7 (seven) days. Take with a full glass of water on an empty stomach.       . Ascorbic Acid (VITAMIN C) 1000 MG tablet Take 1,000 mg by mouth daily.        . Biotin 1000 MCG tablet Take 1,000 mcg by mouth daily.        . Calcium Carbonate-Vitamin D (CALCIUM  600+D) 600-400 MG-UNIT per tablet Take 2 tablets by mouth daily.        . Cholecalciferol (VITAMIN D) 1000 UNITS capsule Take 1,000 Units by mouth daily.        . ferrous sulfate 325 (65 FE) MG tablet Take 325 mg by mouth 2 (two) times daily.        . furosemide (LASIX) 40 MG tablet Take 40 mg by mouth daily. Or as directed       . Glucosamine 500 MG CAPS Take 1 capsule by mouth daily.        . Omega-3 350 MG CAPS Take 1 capsule by mouth daily.        Marland Kitchen omeprazole (PRILOSEC) 20 MG capsule Take 20 mg by mouth daily before breakfast.        . potassium chloride (KLOR-CON) 10 MEQ CR tablet Take 10 mEq by mouth 2 (two) times daily. Or as directed       . tolterodine (DETROL LA) 2 MG 24 hr capsule Take 2 mg by mouth 2 (two) times daily.        . traMADol (ULTRAM) 50 MG tablet Take 50 mg by mouth every 6 (six) hours as needed.        . vitamin A 8000 UNIT capsule Take 8,000 Units by mouth every other day.        . vitamin E 400 UNIT capsule Take 400 Units by mouth daily.        Marland Kitchen warfarin (COUMADIN) 5 MG tablet Take by mouth as directed.          Allergies  Allergen Reactions  . Amiodarone Hcl     REACTION: INTOL to Amiodarone in past  . Codeine   . Fentanyl     REACTION: causes nausea--dizziness    Review of Systems        See HPI       The patient complains of anorexia, decreased hearing, dyspnea on exertion, peripheral edema, prolonged cough, muscle weakness, and difficulty walking.  The patient denies fever, weight loss, weight gain, vision loss, hoarseness, chest pain, syncope, headaches, hemoptysis, abdominal pain, melena, hematochezia, severe indigestion/heartburn, hematuria, incontinence, suspicious skin lesions, transient blindness, depression, unusual weight change, abnormal bleeding, enlarged lymph nodes, and angioedema.     Objective:   Physical Exam     WD, Thin, 77 y/o WF in NAD... appears younger that stated age.  GENERAL:  Alert & oriented; pleasant &  cooperative... HEENT:  [place]/AT, EOM-full, PERRLA, EACs-clear, TMs-wnl, NOSE-clear, THROAT-clear & wnl. NECK:  Supple w/  fairROM; no JVD; normal carotid impulses w/ 2+ bruit; no thyromegaly or nodules palpated; no lymphadenopathy. CHEST:  Decr BS bilat, clear to P & A; without wheezes/ rales/ or rhonchi heard... HEART:  Regular Rhythm- pacer; gr 1/6 SEM without rubs or gallops detected... ABDOMEN:  Soft & nontender; bilat inguinal hernia scars; normal bowel sounds; no organomegaly or mases palpated. EXT: without deformities, mod arthritic changes; no varicose veins/ +venous insuffic/ tr edema. NEURO:  CN's intact; no focal neuro deficits... DERM:  No lesions noted; no rash etc...   Assessment & Plan:

## 2011-02-09 NOTE — Telephone Encounter (Signed)
Pt saw Kriste Basque today.

## 2011-02-09 NOTE — Patient Instructions (Signed)
Today we updated your med list...    Continue your current meds the same for now.    We also recommended MIRALAX (one capful in water daily) & continue the Senakot-S at bedtime...  We repeated your blood work today...    Please call the [facility] in a few days for your lab results:    Dial [phone] & when prompted enter your pt number followed by the # symbol>    Your pt number is [phone]#  Call for any problems or if you develop any worsening abd symptoms (the next step will be a GI referral for further testing)... Let's plan another follow up visit in 2 months.Marland KitchenMarland Kitchen

## 2011-02-10 ENCOUNTER — Encounter: Payer: Self-pay | Admitting: Pulmonary Disease

## 2011-02-10 NOTE — Assessment & Plan Note (Signed)
Mult cardiac diagnoses & followed by DrKlein... Off coumadin at present, on Lasix & KCl only at present... Denies angina, syncope, ch in SOB, etc..Marland Kitchen

## 2011-02-10 NOTE — Assessment & Plan Note (Signed)
See above data in HPI section> she remains on FeSO4 Bid & prev Fe= 78 (21%sat) so OK to decr to one daily & we will f/u labs on retrun.Marland KitchenMarland Kitchen

## 2011-02-10 NOTE — Assessment & Plan Note (Signed)
She has DJD, LBP, sp stenosis, chr pain syndrome, osteoporosis, compression L3, etc... & she manages very well w/ Tramadol & Tylenol.Marland KitchenMarland Kitchen

## 2011-02-10 NOTE — Assessment & Plan Note (Signed)
She has some persist GI symptoms but they seem to be related to constip & we discussed incr Miralax, Senakot-S, etc (she prefers this to GI referral at this time).Marland KitchenMarland Kitchen

## 2011-02-10 NOTE — Assessment & Plan Note (Signed)
Breathing is back to baseline & prev pneumonia resolved.Marland KitchenMarland Kitchen

## 2011-02-27 ENCOUNTER — Telehealth: Payer: Self-pay | Admitting: Internal Medicine

## 2011-02-27 MED ORDER — POTASSIUM CHLORIDE 10 MEQ PO TBCR: 10.0000 meq | EXTENDED_RELEASE_TABLET | Freq: Two times a day (BID) | ORAL | Status: DC

## 2011-03-02 ENCOUNTER — Telehealth: Payer: Self-pay | Admitting: Internal Medicine

## 2011-03-02 NOTE — Telephone Encounter (Signed)
furosimide 40mg  , potassium 10 mg at [facility]

## 2011-03-03 MED ORDER — POTASSIUM CHLORIDE 10 MEQ PO TBCR: 10.0000 meq | EXTENDED_RELEASE_TABLET | Freq: Two times a day (BID) | ORAL | Status: DC

## 2011-03-03 MED ORDER — FUROSEMIDE 40 MG PO TABS: 40.0000 mg | ORAL_TABLET | Freq: Every day | ORAL | Status: DC

## 2011-03-14 ENCOUNTER — Ambulatory Visit: Payer: Self-pay | Admitting: Internal Medicine

## 2011-03-27 NOTE — Assessment & Plan Note (Signed)
[facility]                         ELECTROPHYSIOLOGY OFFICE NOTE   ROISE, EMERT                       MRN:          [id number]  DATE:10/23/2007                            DOB:          [birth year]    [name] is status post repeat AV junction ablation for uncontrolled  atrial arrhythmias.  The first time, she just felt terrific.  She is now  feeling much better again, but not as well as she was before.  Review of  her pacemaker demonstrated that she was previously at a slope of 8 and  that she is currently programmed to 10 and her threshold was previously  auto +0 and now she is auto -0.5 and I wonder to what degree an  overzealous heart rate response is contributing to her symptoms, as  approximately 21% of her beats are in the next bin.   Her medications are notable for a variety of nutraceuticals.   ON EXAMINATION:  Her blood pressure is 132/72, her pulse is 70, her  weight was 96.  Her lungs were clear.  Her heart sounds were regular.  Extremities were without edema.   Interrogation of her pacemaker demonstrates no intrinsic ventricular  rhythm.  Her heart rate excursion is as noted previously.   Review of her chart demonstrates she also has a history of a macrocytic  anemia with her last hemoglobin checked about 8 or 10 months ago.   IMPRESSION:  1. Atrial fibrillation with uncontrolled ventricular response.  2. Status post AV junction ablation (and repeat).  3. Status post pacemaker for the above.  4. Fatigue.  Question over-zealous sensor response versus others.  5. Remote history of macrocytic anemia.   PLAN:  We will plan to check her TSH and B12 and hemoglobin today.   She is to let us know in a couple of weeks how it is that she is  feeling.   We will otherwise plan to see her as scheduled in six months.     Duke Salvia, MD, [facility]  Electronically Signed    SCK/MedQ  DD: 10/23/2007  DT: 10/24/2007  Job #:  [id number]

## 2011-03-27 NOTE — Assessment & Plan Note (Signed)
[facility]                         ELECTROPHYSIOLOGY OFFICE NOTE   [name], [name]                       MRN:          [id number]  DATE:04/13/2008                            DOB:          Apr 26, 1919    [name] is seen in followup for atrial fibrillation, status post AV  junction ablation (x2) and status post pacemaker implantation.  She  continues to complain of lassitude and lack of pep.  Her biggest  complaints arthritis and also the sense that she is drifting to the side  when she walks.  She has had no falls.   Her medication list is quite limited.  She is on Lasix 40, potassium 10,  Coumadin, alendronate, and Vytorin.   PHYSICAL EXAMINATION:  VITAL SIGNS:  Her blood pressure is 125/63, with  pulse of 69.  The weight was 98, which is stable.  LUNGS:  Clear.  HEART:  Sounds were regular.  EXTREMITIES:  Without edema.  NEUROLOGIC:  Demonstrated normal finger-nose, normal heel-shin.  Negative Romberg, and no evidence of nystagmus.   Interrogation of her St. Jude pulse generator demonstrated that she had  no intrinsic ventricular rhythm.  The impedance was 341, the threshold  was 1.12 volts at 0.4.  Battery voltage was 2.68.  She is 100%  ventricularly paced, and strikingly 99% of her heartbeats are in her  first heart rate bin that is between 70-80.  Because of that, her device  was reprogrammed to decrease her thresholds from auto +0 to auto -0.5,  and to increase her slope from 8 to 9.  She will follow up with the  [facility] in a week or two by telephone.   IMPRESSION:  1. Atrial fibrillation, with an uncontrolled ventricular response.  2. Status post atrioventricular junction ablation for the above.  3. Status post pacer for the above.  4. Gait instability and drifting.   To my cursory neurological exam, her balance issues do not translates to  an abnormal cerebellar exam.  I have suggested that she follow up with  Dr.  Kriste Basque about this, though.   We have reprogrammed her pacemaker, and hopefully this will translate  into some more energy.   We will see her again in 6 months' time.    Duke Salvia, MD, [facility]  Electronically Signed   SCK/MedQ  DD: 04/13/2008  DT: 04/13/2008  Job #: [id number]

## 2011-03-27 NOTE — Assessment & Plan Note (Signed)
[name]                         ELECTROPHYSIOLOGY OFFICE NOTE   ALIE, MOUDY                       MRN:          [id number]  DATE:09/24/2007                            DOB:          10/27/1919    [name] was seen today in the clinic on September 24, 2007 post AV  node ablation and follow up of her St. Jude model number 5370 Identity.  Date of implant was January 28, 2004 for sick sinus syndrome and PAS.   On interrogation of her device today her battery voltage was 2.71, since  ablation she is now pacemaker dependent to a rate of 30, R waves were  not measured.  Ventricular pacing threshold was 1.25, voltage 0.5  milliseconds with a ventricular lead impedance of 332 ohms.  I decreased  her base rate from 90 to 80 and activated her rate response today.  She  is ventricularly pacing 94% of the time and is on Coumadin therapy.   We are going to see her back next week for another base rate decrease.      Altha Harm, LPN  Electronically Signed      Duke Salvia, MD, [facility]  Electronically Signed   PO/MedQ  DD: 09/24/2007  DT: 09/24/2007  Job #: [id number]

## 2011-03-27 NOTE — Assessment & Plan Note (Signed)
[facility]                         ELECTROPHYSIOLOGY OFFICE NOTE   [name], [name]                       MRN:          [id number]  DATE:11/25/2008                            DOB:          08-25-1919    [name] comes in about 2 weeks after having abrupt generator  replacement because of rapid battery depletion behavior.  She did  initially well and then has had problems with shortness of breath and  weight gain up to 5 pounds which is quite striking in this lady of only  89.   MEDICATIONS:  Unchanged.   PHYSICAL EXAMINATION:  VITAL SIGNS:  Her blood pressure was 146/58; her  weight was 102, up from 98; and her pulse was 60.  NECK:  Her neck veins were about 8 cm.  LUNGS:  Clear.  HEART:  Sounds were regular.  The pacemaker pocket was well healed.  EXTREMITIES:  Just minimal edema.   Interrogation of her pacemaker demonstrated that her rate was 60.  Her  rate response was different from what she had been programmed  previously.  This was reprogrammed and her lower rate was increased to  70.   IMPRESSION:  1. Complete heart block.  2. Status post pacemaker generator replacement with programming      different from the initial implant.  3. Fluid overload, manifested by dyspnea and edema.   She increased her Lasix her yesterday.  We will increase her Lasix again  from 40 b.i.d. to 80 b.i.d. for 3 days.  We have reprogrammed her  pacemaker.  She is to let us know next week how she is feeling.   We will see her again in 3 months' time.     Duke Salvia, MD, [facility]  Electronically Signed    SCK/MedQ  DD: 11/25/2008  DT: 11/26/2008  Job #: [id number]

## 2011-03-27 NOTE — Op Note (Signed)
NAMENELY, DEDMON                ACCOUNT NO.:  [id number]   MEDICAL RECORD NO.:  [id number]          PATIENT TYPE:  OIB   LOCATION:  2899                         FACILITY:  [facility]   PHYSICIAN:  Doylene Canning. Ladona Ridgel, MD    DATE OF BIRTH:  28-Jun-1919   DATE OF PROCEDURE:  11/11/2008  DATE OF DISCHARGE:  11/11/2008                               OPERATIVE REPORT   PROCEDURE PERFORMED:  Removal of previously implanted dual-chamber  pacemaker and insertion of a new single-chamber pacemaker with pacemaker  pocket revision.   INTRODUCTION:  The patient is a 77 year old woman with chronic AFib.  She has a history of AV node ablation and pacemaker insertion in the  past.  Her pacemaker precipitously dropped its voltage and went past ERI  towards end-of-life.  She is completely pacemaker dependent.  She is now  referred for pacemaker generator change.   PROCEDURE IN DETAIL:  After informed consent was obtained, the patient  was taken to the diagnostic catheterization lab in a fasting state.  After usual preparation and draping, intravenous fentanyl and midazolam  was given for sedation.  Lidocaine 30 mL was infiltrated into the right  infraclavicular region over the old pacemaker insertion site.  A 5-cm  incision was carried out over this region and electrocautery was  utilized to dissect down to the pacemaker pocket which was removed  without difficulty.  The atrial lead was capped.  The ventricular lead  was disconnected from the old pacemaker lead and attached to alligator  clips.  The patient had no escape rhythm.  Pacing demonstrated a  threshold of 1.1 volts at 0.5 milliseconds through her lead and  impedance of 444 ohms.  There were no underlying R-waves secondary to  complete heart block and no escape.  At this point, the new St. Jude  Zephyr XL SR, model 5626 single-chamber pacemaker, serial [id number] was  connected to the ventricular lead and placed back in the subcutaneous  pocket.  The  atrial lead, of course was capped and placed back in the  pocket.  To accommodate the larger size device, the pocket was expanded  with electrocautery and hemostasis was also assured with electrocautery.  The pocket was again irrigated with kanamycin and the incision was  closed with 2-0 Vicryl followed by a layer of 3-0 Vicryl.  Benzoin was  painted on the skin, Steri-Strips were applied, and a pressure dressing  was placed and the patient was returned to her room in satisfactory  condition.   COMPLICATIONS:  There were no immediate procedure complications.   RESULTS:  This demonstrated successful removal of a previously implanted  St. Jude dual-chamber pacemaker and insertion of a new St. Jude single-  chamber pacemaker in a patient with complete heart block.      Doylene Canning. Ladona Ridgel, MD  Electronically Signed     GWT/MEDQ  D:  11/11/2008  T:  11/11/2008  Job:  [id number]

## 2011-03-27 NOTE — Assessment & Plan Note (Signed)
[facility]                             PULMONARY OFFICE NOTE   [name], [name]                       MRN:          [id number]  DATE:03/31/2007                            DOB:          07/19/1919    HISTORY OF PRESENT ILLNESS:  The patient is an 77 year old female  patient of Dr. Jodelle Green who has a known history of hyperlipidemia,  asthmatic bronchitis, osteoarthritis and atrial fibrillation status post  pacemaker, coronary artery disease and spinal stenosis, presents today  with a 2-week history of productive cough with thick, yellowish-brown  sputum, nasal congestion, sinus pain and pressure.  The patient denies  any hemoptysis, chest pain, orthopnea, PND or leg swelling.   PAST MEDICAL HISTORY:  Reviewed.   CURRENT MEDICATIONS:  Reviewed.   PHYSICAL EXAMINATION:  The patient is pleasant elderly female in no  acute distress.  She is afebrile with stable vital signs.  O2 saturation is 96% on room  air.  HEENT:  Nasal erythematous.  Maxillary sinus tender to percussion.  Posterior pharynx is clear.  TMs are normal.  NECK:  Supple without cervical adenopathy.  No JVD.  LUNGS:  Reveal coarse breath sounds bilaterally without any wheezing.  CARDIAC:  Regular rate.  ABDOMEN:  Soft and nontender.  EXTREMITIES:  Warm without any edema.   IMPRESSION/PLAN:  Acute tracheobronchitis and rhinosinusitis.  The  patient to begin Omnicef x10 days.  Mucinex DM twice daily. Saline nasal  spray p.r.n. The patient is to return back with Dr. Kriste Basque as scheduled  or sooner if needed.      Rubye Oaks, NP  Electronically Signed      Lonzo Cloud. Kriste Basque, MD  Electronically Signed   TP/MedQ  DD: 04/02/2007  DT: 04/02/2007  Job #: [id number]

## 2011-03-27 NOTE — Discharge Summary (Signed)
NAMEMARYKAY, [name]                ACCOUNT NO.:  [id number]   MEDICAL RECORD NO.:  [id number]          PATIENT TYPE:  OIB   LOCATION:  2899                         FACILITY:  [facility]   PHYSICIAN:  Doylene Canning. Ladona Ridgel, MD    DATE OF BIRTH:  [birth year]   DATE OF ADMISSION:  11/11/2008  DATE OF DISCHARGE:  11/11/2008                               DISCHARGE SUMMARY   FINAL DIAGNOSES:  1. Chronic atrial fibrillation status post pacemaker and subsequent      atrioventricular node ablation for uncontrolled ventricular rate.  2. Pacemaker which is a Engineer, water. Jude identity is at end-of-life.  3. Pacemaker generator changed, November 11, 2008 with implant of a      St. Jude Zephyr XL SR dual-chamber pacemaker.   PROCEDURES:  November 11, 2008, explantation of existing pacemaker of  St. Jude Identity DR, which is at end-of-life and implant of a new  device, a [employer] Zephyr XL SR dual-chamber pacemaker, Dr. Lewayne Bunting.   BRIEF HISTORY:  [name] is an 77 year old female.  She has a history  of chronic atrial fibrillation.  She had an original pacemaker placed in  1996 for uncontrolled ventricular rate and pacemaker implantation with  subsequent AV node ablation.  The patient is pacer dependent and  requires generator change.   HOSPITAL COURSE:  The patient presents electively.  On November 11, 2008, she underwent explantation of her existing device with implant of  a new St. Jude Zephyr pacemaker by Dr. Lewayne Bunting.  The patient is  doing well after the procedure and has no complications at the time of  discharge.  She is asked to keep her incision dry for the next 7 days  and to sponge bathe until Friday, November 19, 2008.  She is to remove the  large bandage on the morning of Friday, November 12, 2008 and leave the  incision open to the air.   MEDICATIONS AT DISCHARGE:  1. Keflex 1 tablet twice daily for the next 5 days.  2. Lasix 40 mg twice daily.  3. Micro-K 10 mEq twice daily.  4. Coumadin as before this admission.  5. Alendronate sodium 70 mg weekly.  6. Detrol 2 mg twice daily.  7. Vitamin D supplement daily.   FOLLOWUP:  She follows up at [facility], [address], Friday, November 26, 2008 at 2 o'clock.   LABORATORY DATA:  Lab studies pertinent to this admission were drawn on  November 09, 2008.  White cells 5.8, hemoglobin 11.4, hematocrit 33.3,  and platelets were 160.  Protime was 36.7.  INR 3.7.  Sodium 141,  potassium 4.3, chloride 105, bicarbonate 30, glucose 131, BUN was 21,  and creatinine 0.9.      Maple Mirza, PA      Doylene Canning. Ladona Ridgel, MD  Electronically Signed    GM/MEDQ  D:  11/11/2008  T:  11/12/2008  Job:  [id number]

## 2011-03-27 NOTE — Assessment & Plan Note (Signed)
[facility]                         ELECTROPHYSIOLOGY OFFICE NOTE   [name], [name]                       MRN:          [id number]  DATE:10/01/2007                            DOB:          [birth year]    [name] was seen in the clinic today on October 01, 2007.  She is  post ablation earlier this month and for a rate reduction today in her  base rate from 80 to 70.  No other changes were made and she has a  followup appointment with Dr. Graciela Husbands in December.      Altha Harm, LPN  Electronically Signed      Duke Salvia, MD, [facility]  Electronically Signed   PO/MedQ  DD: 10/01/2007  DT: 10/01/2007  Job #: [id number]

## 2011-03-27 NOTE — Op Note (Signed)
[name], [name]                ACCOUNT NO.:  [id number]   MEDICAL RECORD NO.:  [id number]          PATIENT TYPE:  INP   LOCATION:  3741                         FACILITY:  [facility]   PHYSICIAN:  Duke Salvia, MD, FACCDATE OF BIRTH:  1919-06-13   DATE OF PROCEDURE:  09/19/2007  DATE OF DISCHARGE:                               OPERATIVE REPORT   PREOPERATIVE DIAGNOSIS:  Atrial fibrillation with an uncontrolled  ventricular response, status post previous atrioventricular junction  ablation.   POSTOPERATIVE DIAGNOSES:  1. Atrial fibrillation with an uncontrolled ventricular response,      status post previous atrioventricular junction ablation.  2. Recurrence of conduction.   PROCEDURE:  Atrioventricular junction ablation requiring both a right-  sided and a left-sided approach.   Following the obtaining of informed consent, the patient was brought to  the electrophysiology laboratory and placed on the fluoroscopic table in  a supine position.  After routine prep and drape, lidocaine was  infiltrated and femoral venous catheterization was undertaken with the  standard access.  An SRO sheath was placed and a 4-mm tip ablation  catheter was inserted mapping sites in the posterior septal space.  Very  small His electrograms were identifiable.  Ablation was undertaken and  ultimately a junctional rhythm ensued at a rate of about 36 beats per  minute.   After a waiting phase of 10-15 minutes, however, recurrent conduction  was identified.  Repeat mapping of the right posteroseptal space was  undertaken and despite numerous sites where very small His potentials  were identified and RF was applied, conduction persisted.   After attempting on the right side for about an hour, it was elected to  go to the left side.   Right femoral arterial catheterization was undertaken.  Blood pressure  was monitored transcutaneously via the sheath system and a 4-mm tip  small curved catheter was  utilized.  It was very difficult to get the  catheter to prolapse in either the ascending or the descending aorta.  I  ended up using the aortic bifurcation to catch the tip of the catheter  and then was able to gradually push the catheter in a prolapsed fashion.  My initial attempt failed to allow me to prolapse the catheter across  the aortic valve.  The second time I was able to do that.  Mapping then  was undertaken on the septal surface and we identified a His potential a  little bit caudal to where I should have been.  RF was applied again  with interruption of antegrade conduction, initially generation of left  bundle branch block.  This then resolved.  I pulled the catheter back  just a width or two, probably less than 0.5 cm, at a site where while  during manipulation complete heart block had ensued, and thereafter no  further antegrade conduction was noted.   A total of 10 minutes 39 seconds of fluoroscopy was utilized at 7-1/2  frames per second.   A total of 6 minutes 11 seconds of RF energy was applied as noted  previously.   At the  end of the procedure the catheters were removed.  The patient was  transferred to the holding area for sheath removal.  I should note that  the patient received heparin at the time of the arterial cannulation.   The patient tolerated the procedure thus far without apparent  complication.      Duke Salvia, MD, [facility]  Electronically Signed     SCK/MEDQ  D:  09/19/2007  T:  09/20/2007  Job:  [id number]

## 2011-03-27 NOTE — Discharge Summary (Signed)
[name], [name]                ACCOUNT NO.:  [id number]   MEDICAL RECORD NO.:  [id number]          PATIENT TYPE:  INP   LOCATION:  3741                         FACILITY:  [facility]   PHYSICIAN:  Maple Mirza, PA   DATE OF BIRTH:  Jul 14, 1919   DATE OF ADMISSION:  09/19/2007  DATE OF DISCHARGE:  09/20/2007                               DISCHARGE SUMMARY   ALLERGIES:  SHE HAS AN INTOLERANCE TO AMIODARONE WHICH CAUSES NAUSEA AND  ABDOMINAL FULLNESS.   TIME FOR THIS DICTATION AND EXAM:  Greater than 35 minutes.   FINAL DIAGNOSIS:  1. Discharging day one status post electrophysiology      study/radiofrequency catheter ablation of the AV node.      a.     Recurrent AV conduction post ablation originally done       February 2008.      b.     Radiofrequency catheter ablation achieved using both left       and right-sided approach.   SECONDARY DIAGNOSES:  1. Recurrent atrial fibrillation with conduction 11% through a      previously ablated AV node.  Patient has palpitations, skipping and      fatigue.  2. History of radiofrequency catheter ablation of the AV node February      2008.  3. Intolerance of amiodarone with nausea and abdominal fullness.  4. Paroxysmal atrial fibrillation with chronic Coumadin.  5. Status post pacemaker for symptomatic bradycardia.  Generator      change March 2005 implant St. Jude Identity model [id number].   PROCEDURE:  September 19, 2007, electrophysiology study, radiofrequency  catheter ablation of the AV node requiring right and left-sided approach  with successful elimination of antegrade conduction Dr. Sherryl Manges.   BRIEF HISTORY:  [name] is an 77 year old female.  She returned  the office on October 30th.  She has palpitations, skipping most obvious  to her when she is just awaking in the morning.  She had AV junction  ablation February 2008 and felt great for several months with no nodes  increasing problems with palpitation.   The  patient underwent Holter monitor.  This showed that at least 11% of  her heart beats are conducted and they are associated with rapid  ventricular response.   The patient has recurrence of conduction to her AV ablation site.  She  has symptoms with this recurrence and she would like to pursue a repeat  radiofrequency catheter ablation procedure.   HOSPITAL COURSE:  The patient presented electively November 7.  She  underwent successful ablation of the AV node with no escape and will  discharge day procedure #1.  She has had no hematomas at either groin,  and she is persistently V pacing.   DISCHARGE MEDICATIONS:  1. Lasix 40 mg twice daily.  2. Micro-K 10 milliequivalents twice daily.  3. Coumadin 2.5 mg daily except for 5 mg Monday, Wednesday, Friday.      She received 5 mg on the evening of November 7.  4. Alendronate sodium 70 mg weekly.  5. Detrol 2 mg twice daily.  6. Vytorin 10/20 1/2 tablet daily.   FOLLOWUP:  She follows up with [facility]  Wednesday November 12 at 9:20.  she will see Dr. Graciela Husbands December 11 a  12:45 p.m.  At the pacer clinic, her device will be down regulated from  90 beats to 80 beats a minute the first week, and then the second week  from 80 beats to 70 beats.   LABORATORY STUDIES:  Pertinent to this admission, complete blood count  white cells 3.3, hemoglobin 12.8, hematocrit 37.4, platelets of 182.  Serum electrolytes sodium 138, potassium 3.6, chloride 98, carbonate 29,  BUN is 12, creatinine 0.81, glucose of 92.  The protime on admission  November 7 was 13.3, INR 1.0.      Maple Mirza, PA     GM/MEDQ  D:  09/19/2007  T:  09/20/2007  Job:  [id number]   cc:   Duke Salvia, MD, [facility]  Scott M. Kriste Basque, MD

## 2011-03-27 NOTE — Op Note (Signed)
NAMEMAYCEE, BLASCO                ACCOUNT NO.:  [id number]   MEDICAL RECORD NO.:  [id number]          PATIENT TYPE:  AMB   LOCATION:  DSC                          FACILITY:  [facility]   PHYSICIAN:  Katy Fitch. Sypher, M.D. DATE OF BIRTH:  [birth year]   DATE OF PROCEDURE:  08/26/2007  DATE OF DISCHARGE:                               OPERATIVE REPORT   PREOPERATIVE DIAGNOSIS:  1. 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  2. Enlarging mass palmar surface of left index finger flexor sheath at      A1 pulley, rule out myxoid or ganglion cyst.   POSTOPERATIVE DIAGNOSIS:  1. 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  2. Enlarging mass palmar surface of left index finger flexor sheath at      A1 pulley, rule out myxoid or ganglion cyst.   OPERATION:  1. Excision of myxoid cyst from the left index finger A1 pulley.  2. Excision of a mucoid cyst and debridement of mass on dorsal aspect      of left thumb nail fold with synovectomy of radial aspect of      interphalangeal joint.   OPERATING SURGEON:  Josephine Igo, M.D.   ASSISTANT:  Molly Maduro Dasnoit PA-C.   ANESTHESIA:  Local 2% lidocaine metacarpal head level block of left  thumb and left index finger.  Supervising anesthetist was Dr. Teressa Senter.  This was a minor roomcase.   INDICATIONS:  Trease Bremner is an 88-year woman referred through the  courtesy of Dr. Irene Limbo dermatologist for evaluation of a left nail  fold lesion and nail plate deformity.   Upon consultation in the office she was noted to have a large mucoid  cyst with IP degenerative arthritis.  Her cyst was threatening an  impending rupture.  In addition she noted a mass on the palmar surface  of her left index finger flexor sheath.   Preoperatively we discussed the risks and benefits of surgery to  Ms.  Koehler.  She is on chronic Coumadin anticoagulation due to placement of  a pacemaker and other cardiovascular issues.   She has a mechanical valve in place and is not able to be weaned from  Coumadin.   Given the superficial location of her lesions and weighing the risks and  benefits of surgery versus allowing spontaneous rupture of her cyst, we  elected at this time to proceed with excision of her myxoid cyst from  the index finger and debridement of the radial aspect of her  interphalangeal joint an effort to relieve her mucoid cyst and prevent  rupture, possible septic arthritis of her IP joint.   She is brought to the operating at this time anticipating the  aforementioned procedure.   Cerena Baine is brought to the operating room and placed in supine  position on the operating table.   Preoperatively her INR was measured and found be 3.2.   Given the  superficial nature of our procedures, we should be able to  control hemostasis adequately with direct pressure and careful avoidance  of any vascular transection.   She was put brought to room 1, placed in supine position on the table  and after Betadine prep, 2% lidocaine metacarpal head level blocks were  placed for the index finger and thumb.   After five  minutes, excellent anesthesia was achieved.   A pneumatic tourniquet was applied to padded proximal left brachium.  The left arm was prepped with Betadine soap solution, sterilely draped.  Following elevation and gentle manual compression of her arm.  The  arterial tourniquet was inflated on the proximal brachium to 250 mmHg.  The procedure commenced with short oblique incision exposing the mass  over the left index finger flexor sheath.  Blunt Ragnell retractors were  placed protecting neurovascular structures.   The wound was explored and a 1 cm diameter cyst noted directly on the A1  pulley.  This circumferentially dissected and removed piecemeal and   completely with a rongeur.   Hemostasis achieved with bipolar cautery followed by repair of the skin  with horizontal mattress suture of 5-0 nylon.   Attention directed the thumb.  A longitudinal incision was fashioned  from the base of the mucoid cyst proximally to the radial aspect of the  IP joint.   Subcutaneous tissues were carefully divided taking care to identify the  cyst wall.  This was followed back to rather sizable mass directly over  the germinal nail matrix and intermediate nail fold.   The mucinous contents of the cysts were drained followed by  circumferential dissection of the mass and excision of the mass.  The  mass was passed off in formalin for pathologic evaluation.   We followed the neck of the cyst back to the radial aspect of the  interphalangeal joint.  This was amputated at the joint capsule and the  capsule entered with a minimal capsulotomy with a micro rongeur.   Hemostasis was achieved with bipolar cautery of all visible vessels.   Given the fact that Ms. Teffeteller is on chronic Coumadin anticoagulation I  elected not to curette the bone spurs nor significantly instrument the  joint in that our concern would be that she could develop a  postoperative hemarthrosis.   After hemostasis was assured.  The wound was repaired with horizontal  mattress sutures of 5-0 nylon.   Xeroflo and sterile gauze applied to both wounds followed by a  compressive wrap of Coban for the thumb and an Ace bandage for the hand.   Manual pressure was held on the wounds for five minutes.   There no apparent complications.  Ms. Tillis tolerated surgery and  anesthesia well.  She is to the transferred recovery with stable vital  signs.   We will have returned our office in follow-up in approximately one week.  A postoperative conference was held with Ms. Chamberlin's daughter.      Katy Fitch Sypher, M.D.  Electronically Signed     RVS/MEDQ  D:  08/26/2007  T:   08/27/2007  Job:  [id number]   cc:   Dr. Irene Limbo, Dermatology

## 2011-03-27 NOTE — Assessment & Plan Note (Signed)
[facility]                         ELECTROPHYSIOLOGY OFFICE NOTE   HEIRESS, WILLIAMSON                       MRN:          [id number]  DATE:09/11/2007                            DOB:          05/21/1919    Ms. Solivan returns to the office.  She has been having complaints of  palpitations following AV junction ablation that was accomplished in  February.  She felt great for a couple of months.  She actually went out  [place] with her family and traveled with her daughter and did all kinds of  things but over the last month has noted increasing problems with  palpitations.  We undertook a Holter monitor and this demonstrates at  least 11% of her beats are conducted.  They are associated with a rapid  ventricular response.   Her medications currently do not include rate-controlling drugs.  I  should note that she is on alendronate, which has been associated in  some things with atrial fibrillation but is not unequivocally so.   On examination today her blood pressure is mildly elevated at 150/68,  her pulse is 84.  LUNGS:  Clear.  Heart sounds were regular right now, though they changed with moving  around a little bit.  EXTREMITIES:  Without edema.   Her Holter monitor is as noted previously.   IMPRESSION:  1. Atrial fibrillation.  2. Status post atrioventricular ablation with recurrence of intrinsic      conduction.  3. Bradycardia.  4. Status post pacemaker implantation.  5. Borderline hypertension.   Ms. Chaires has recurrence of conduction to her AV ablation.  Given the  improvement in her symptoms following ablation and the deterioration of  her symptoms since recurrence, she would like to pursue AV junction  ablation, as would I.   Because we may need to go from the left side, we will anticipate  discontinuing her Coumadin about 4 or 5 days ahead of time.   She understands and is willing to proceed.     Duke Salvia, MD, [facility]  Electronically Signed    SCK/MedQ  DD: 09/11/2007  DT: 09/12/2007  Job #: [id number]

## 2011-03-30 NOTE — Assessment & Plan Note (Signed)
[facility]                         ELECTROPHYSIOLOGY OFFICE NOTE   DAVONNA, ERTL                       MRN:          [id number]  DATE:11/18/2006                            DOB:          10-11-1919    Ms. Thall follows up today for additional evaluation of her atrial  fibrillation and amiodarone treatment.  The patient saw Dr. Graciela Husbands  several weeks ago and her dose of Sotalol was discontinued and she was  begun on amiodarone initially 400 mg twice a day.  After one week of  this she started to feel abdominal fullness and discomfort and  generalized fatigue and malaise.  Her dose of amiodarone was decreased  to 200 mg twice a day.  Her symptoms improved minimally.  She returns  today for follow-up.  She has had no palpitations or symptomatic atrial  fibrillation.  She otherwise had no specific complaints other than  feeling badly.   PHYSICAL EXAMINATION:  GENERAL:  She is a pleasant, elderly-appearing  woman in no acute distress.  VITAL SIGNS:  The blood pressure today was 160/70, the pulse was 70 and  regular, the respirations were 18.  The weight was 100 pounds.  NECK:  Without jugular venous distention.  LUNGS:  Clear bilaterally to auscultation.  No wheezes, rales, or  rhonchi.  CARDIOVASCULAR:  Revealed regular rate and rhythm with normal S1 and a  split S2.  EXTREMITIES:  Demonstrated no cyanosis, clubbing, or edema.  The pulses  were 2+ and symmetric.   MEDICATIONS:  Potassium, Coumadin, Detrol, furosemide, Vytorin, and  amiodarone as previously noted.   IMPRESSION:  1. Paroxysmal atrial fibrillation.  2. Symptomatic bradycardia.  3. Status post pacemaker insertion.  4. Amiodarone therapy with questionable side effects.   DISCUSSION:  Today I have asked Ms. Gamboa to decrease her amiodarone  to 200 mg daily.  She is scheduled to follow up with Dr. Graciela Husbands in three  weeks.  She has not felt palpitations by her report, though her  EKG  today demonstrates only a single ventricular pacing spike without any  obvious atrial activity, making me wonder whether she is in a very fine  atrial fibrillation.  When she sees Dr. Graciela Husbands back in three weeks, if  she is in fibrillation then we will need to consider DC cardioversion.     Doylene Canning. Ladona Ridgel, MD  Electronically Signed    GWT/MedQ  DD: 11/18/2006  DT: 11/18/2006  Job #: [id number]

## 2011-03-30 NOTE — Discharge Summary (Signed)
[name], [name]                ACCOUNT NO.:  [id number]   MEDICAL RECORD NO.:  [id number]          PATIENT TYPE:  OIB   LOCATION:  4731                         FACILITY:  [facility]   PHYSICIAN:  Duke Salvia, MD, FACCDATE OF BIRTH:  08-15-1919   DATE OF ADMISSION:  01/06/2007  DATE OF DISCHARGE:  01/07/2007                               DISCHARGE SUMMARY   This dictation greater than 35 minutes.   ALLERGY:  CODEINE   PRINCIPAL DIAGNOSES:  1. Discharge day #1, status post electrophysiology study/AV junction      ablation.  2. Persistent atrial fibrillation on chronic Coumadin.      a.     Atrial fibrillation is symptomatic with fatigue/peripheral       edema.      b.     Patient failed Coumadin as rate control secondary to edema.      c.     Sotalol failed to provide antiarrhythmic therapy.      d.     Amiodarone stopped secondary to nausea, abdominal fullness       an jitters.   SECONDARY DIAGNOSES:  1. Status post implantation of St. Jude Identity dual-chamber      pacemaker with generator change March 2005 for symptomatic      bradycardia.  2. The patient had hole in heart  repaired in [facility] at [facility],      most probably atrial septal defect.   PROCEDURE:  January 06, 2007, electrophysiology studies/AV junction  ablation with some residual intrinsic conduction at 40 milliseconds.  Dr. Sherryl Manges.   BRIEF HISTORY:  Ms. Schuler is an 77 year old female who has recently  been started on amiodarone, but this has been discontinued secondary to  nausea and abdominal fullness.  The attempt was to convert atrial  fib/flutter into a sinus rhythm.  In addition to abdominal fullness, the  patient, even on a reduced dose of amiodarone, complains of jitteriness,  being unable to write her name or even drink a cup of coffee.   Her pacemaker was interrogated and showed that she is 100% V-paced and  has been in atrial fibrillation for the last 51 days.   Ms. Shadrick as been  better with atrial fibrillation having a controlled  ventricular response.  However, amiodarone has been causing side  effects.  The next step would be electrophysiology study, radiofrequency  catheter ablation of the AV node.  Risks and benefits have been  described to the patient.  She is willing to proceed.  This will be  scheduled electively.   HOSPITAL COURSE:  The patient presented electively to [facility] January 06, 2007.  She underwent successful radiofrequency  catheter ablation of AV junction.  She did have some residual conduction  at 40 milliseconds.  The patient discharge postprocedure day #1.  She is  having no complaints after the procedure.  She is achieving 99% oxygen  saturation on room air.  Her pro time is 22.2, INR 1.9 on day of  discharge.  She is pacing consistently at 90 beats per minute.   DISCHARGE  MEDICATIONS:  1. Furosemide 40 mg twice daily.  2. Micro-K 10 milliequivalents twice daily.  3. Detrol XA 2 mg twice daily.  4. Vytorin 10/40 1/2 tablet daily at nighttime.  5. Fosamax 75 mg one tablet every Friday.  6. Vitamin E 400 international units daily.  7. Vitamin C 1000 mg daily.  8. Calcium 1500 mg daily.  9. Glucosamine one tablet daily.  10.Tylenol Arthritis 2 tablets as needed.  11.Coumadin 1 mg Monday and Friday 2.5 mg every other day.   Follow-up with [facility] of [address]  1. At the pacemaker [facility], Monday, March 3 at 12:30.  her pacing      rate will be turned from 90-80.  2. Clinic, Thursday, March 20, 9:40.  Her pacing rate will be turned      down from 80 to 70.  This patient is now pacemaker dependent.   Once again 35 minutes.   LABORATORY STUDIES PERTINENT TO THIS ADMISSION:  White cells 5,  hemoglobin 11.5, hematocrit 35, platelets of 155, pro time is 16, INR  1.6 on February 18.  Sodium was 139, potassium 4.2, chloride 102,  carbonate 29, glucose 79, BUN is 21, creatinine 1.1.       Maple Mirza, [place]      Duke Salvia, MD, [facility]  Electronically Signed    GM/MEDQ  D:  01/07/2007  T:  01/07/2007  Job:  [id number]   cc:   Lonzo Cloud. Kriste Basque, MD  Duke Salvia, MD, [facility]

## 2011-03-30 NOTE — Assessment & Plan Note (Signed)
[facility]                         ELECTROPHYSIOLOGY OFFICE NOTE   KEYLEN, UZELAC                       MRN:          [id number]  DATE:12/12/2006                            DOB:          [birth year]    Ms. Looper comes in.  She says she is feeling much better.  Her heart  is not fluttering around anymore.  Her side effects related to the  amiodarone, including nausea and abdominal fullness are better.  She  also, however, complains of jitteriness, and she cannot even write her  name and sometimes cannot drink a cup of coffee.   Interrogation of her pacemaker demonstrates that she has 100%  ventricular pacing and she has been in atrial fibrillation for almost  all of the last 51 days.   Her other medications include Vytorin, Detrol, Coumadin, and furosemide,  taking it now b.i.d.   PHYSICAL EXAMINATION:  VITAL SIGNS:  Her blood pressure was 154/60, her  pulse is 98.  LUNGS:  Clear.  HEART:  Heart sounds were regular.  EXTREMITIES:  Without edema.   IMPRESSION:  1. Atrial fibrillation with rapid ventricular response, now controlled      with amiodarone.  2. Bradycardia.  3. Status post pacemaker.   Ms. Vetter is doing a lot better with controlled ventricular response;  however, she is having side effects from the amiodarone.  I have  suggested that we undertake AV ablation.  We have discussed the  potential benefits as well as the potential risks, including but not  limited to being dependent upon her device.  She understands these risks  and would like to proceed.  We plan to schedule this electively.     Duke Salvia, MD, [facility]  Electronically Signed    SCK/MedQ  DD: 12/12/2006  DT: 12/12/2006  Job #: [id number]   cc:   Lonzo Cloud. Kriste Basque, MD

## 2011-03-30 NOTE — Assessment & Plan Note (Signed)
[facility]                         ELECTROPHYSIOLOGY OFFICE NOTE   [name], [name]                       MRN:          [id number]  DATE:01/13/2007                            DOB:          Dec 20, 1918    [name] was seen today in the clinic on January 13, 2007, for a post  hospital visit post AV node ablation done February 2008 and followup on  her [place]. Jude model [phone] Identity.  Date of implant was January 28, 2004,  for sick sinus syndrome.  On interrogation of her device today, her  battery voltage is 2.71.  R waves measured 3.6 to 5.0 millivolts with a  ventricular capture threshold of 1.38 volts at 0.5 milliseconds, and a  ventricular lead impedance of 423 ohms.  I lowered her base rate from 90  to 80 today and activated her rate response, and she will return on  March 20 for yet another rate reduction.      Altha Harm, LPN  Electronically Signed      Duke Salvia, MD, [facility]  Electronically Signed   PO/MedQ  DD: 01/13/2007  DT: 01/13/2007  Job #: [id number]

## 2011-03-30 NOTE — Assessment & Plan Note (Signed)
[facility]                         ELECTROPHYSIOLOGY OFFICE NOTE   KATENA, PETITJEAN                       MRN:          [id number]  DATE:10/22/2006                            DOB:          1919-05-27    Ms. [name] is seen.  She is status post pacer implantation for  bradycardia.  She is having recurrent problems with atrial fibrillation.  She feels quite washed out.  She has had some problems with peripheral  edema recently as well.   Interrogation of her pacemaker demonstrates that she is significantly  atrial under sensing but she has at least 13% atrial fibrillation.   MEDICATIONS:  1. Betapace that she has been on for years for this at 80 mg twice      daily.  2. Micro-K.  3. Coumadin.  4. Diltiazem which we tried was discontinued because of edema.   PHYSICAL EXAMINATION:  VITAL SIGNS:  Her blood pressure is 113/64.  LUNGS:  Clear.  HEART:  Sounds were irregular.  EXTREMITIES:  There is 1+ peripheral edema.   Interrogation of her St. Jude Pulse generator was notable for the  fibrillation wave of 0.2 with impedance of 424.  The R wave of 5 with  impedance of 321, a threshold of 1.25 volts.   The device was reprogrammed to maximize atrial sensitivity.  EGMs were  turned off to maximize longevity.   I had a length discussion with [name] about the potential benefits  of discontinuing the sotalol and trying an alternative antiarrhythmic  drug given her persistent symptoms in the setting of what appears to be  adequate rate control during atrial fibrillation, although the identity  pulse generator does not give [place] that information specifically.  She is  agreeable.  We will begin her on 400 mg twice daily for 2 weeks, 400 mg  a day and 200 mg a day.   I will plan to see her again in about 6 weeks time at which time she  will need repeat of her surveillance laboratories.     Duke Salvia, MD, [facility]  Electronically Signed    SCK/MedQ  DD: 10/22/2006  DT: 10/23/2006  Job #: [id number]

## 2011-03-30 NOTE — Op Note (Signed)
NAME:  [name], [name]                          ACCOUNT NO.:  [id number]   MEDICAL RECORD NO.:  [id number]                   PATIENT TYPE:  OIB   LOCATION:  2864                                 FACILITY:  [facility]   PHYSICIAN:  Duke Salvia, M.D.               DATE OF BIRTH:  01-05-1919   DATE OF PROCEDURE:  01/28/2004  DATE OF DISCHARGE:  01/28/2004                                 OPERATIVE REPORT   PREOPERATIVE DIAGNOSES:  Previously implanted pacemaker for symptomatic  sinus bradycardia, now at end of life.   POSTOPERATIVE DIAGNOSES:  Previously implanted pacemaker for symptomatic  sinus bradycardia, now at end of life.   PROCEDURE:  Explantation of a previously implanted device and implantation  of a new device.   DESCRIPTION OF PROCEDURE:  Following obtaining informed consent the patient  was brought to the electrophysiology laboratory and placed on the  fluoroscopic table in the supine position.  After routine prep and drape of  the right upper chest lidocaine was infiltrated along the line of the  previous incision and carried down to the layer of the pacemaker pocket  using sharp dissection.  The pocket was opened, the leads were freed up and  the device was explanted.   Interrogation of the leads demonstrated the P wave was 3.2 mVs with a pacing  threshold of 1.2 Vs at 0.5 msec with an impedance of 6, and electronic  threshold of 2.7 mA.  The bipolar R wave was 8.2 mV with a pacing impedance  of 47 ohms, with a pacing threshold of 1.3 V at 0.5 msec.  The current of  threshold was 2.7 mA.  With these acceptable parameters recorded, the leads  were then attached to a St. Jude Identity 5370 pulse generator, serial  [id number].  It should be noted that the previously implanted atrial lead was a  St. Jude 1188T, 52 cm lead, serial #[id number] and the previously implanted  ventricular lead was 1346T with a serial #[id number].  Ventricular pacing and  atrial pacing were identified.  The  pocket was copiously irrigated with  antibiotic containing saline solution.  Hemostasis was assured and the leads  and the pulse generator were then placed in the pocket.  The wound was  closed in three layers in the normal fashion.  The wound was washed, dried  and Benzoin and a Steri-Strips dressing was applied.  Needle counts, sponge  counts and instrument counts were correct at the end of the procedure  according to the staff.   The patient tolerated the procedure without apparent complication.                                               Duke Salvia, M.D.    SCK/MEDQ  D:  01/28/2004  T:  01/31/2004  Job:  [id number]   cc:   Electrophysiology Laboratory   [facility]

## 2011-03-30 NOTE — Assessment & Plan Note (Signed)
[facility]                         ELECTROPHYSIOLOGY OFFICE NOTE   GIORGIA, WAHLER                       MRN:          [id number]  DATE:01/30/2007                            DOB:          1919/04/24    HISTORY OF PRESENT ILLNESS:  [name] was seen today in the clinic  on January 30, 2007, for her second visit post ventilation and  reprogramming of her St. Jude model [phone] identity.  Date of implant was  January 28, 2004, for sick sinus syndrome.  Battery voltage today was 2.69  with a right ventricular lead impedence of 317 ohms.  I decreased her  base rate from 80 to 70 today, and she will continue on her regular  follow up schedule.      Altha Harm, LPN  Electronically Signed      Duke Salvia, MD, [facility]  Electronically Signed   PO/MedQ  DD: 01/30/2007  DT: 01/30/2007  Job #: [id number]

## 2011-03-30 NOTE — Op Note (Signed)
[name], [name]                ACCOUNT NO.:  [id number]   MEDICAL RECORD NO.:  [id number]          PATIENT TYPE:  OIB   LOCATION:  2807                         FACILITY:  [facility]   PHYSICIAN:  Duke Salvia, MD, FACCDATE OF BIRTH:  09/09/1919   DATE OF PROCEDURE:  01/06/2007  DATE OF DISCHARGE:                               OPERATIVE REPORT   PREOPERATIVE DIAGNOSIS:  Atrial fibrillation.   POSTOPERATIVE DIAGNOSIS:  Atrial fibrillation.   PROCEDURE:  AV junction ablation.   Following obtaining informed consent, the patient was brought to the  electrophysiology laboratory and placed on the fluoroscopic table in  supine position.  After routine prep and drape, her device was  interrogated and reprogrammed to VVI 40.  Cardiac catheterization was  performed with local anesthesia and conscious sedation.  Noninvasive  blood pressure monitoring and transcutaneous oxygen saturation  monitoring were performed continuously throughout the procedure.  Following the procedure, the catheter was removed.  Hemostasis was  obtained.  The patient was transferred pulmonary stable condition.   Catheter:  A 7-French 4 mm deflectable tip ablation catheter was  inserted via the right femoral vein using an SR0 sheath to mapping sites  in the posterior septal space.   Following introduction of the catheter, the patient's His electrogram  was identified.  The HV interval was 37 milliseconds.  QT interval of  473 milliseconds and the R interval was 965 milliseconds.   Radiofrequency energy was applied to sites where the His potential was  identified.  A total of about 80 seconds of RF energy was applied in  five applications.  After the fourth ablation, a junctional rhythm  ensued and with discontinuation of RF, a paced rhythm was evident.  However, just prior to discontinuation of RF I had pulled the catheter  back just a hair where the junctional rhythm became more clearly  evident.  Because of  this, I added 15 seconds of RF to this site.   Fluoroscopy time was approximately 1 minute.   IMPRESSION:  1. Atrial fibrillation.  2. Intolerance to medications.  3. Normal HV function with successful AV junction ablation.  I should      note there was some residual intrinsic      conduction at rates between 35 and 45 beats per minute.  It was      elected to leave this alone as the patient had underlying right      bundle branch block.   The patient tolerated procedure well.      Duke Salvia, MD, [facility]  Electronically Signed     SCK/MEDQ  D:  01/06/2007  T:  01/06/2007  Job:  [id number]   cc:   EP Lab  [facility]

## 2011-04-03 ENCOUNTER — Encounter: Payer: Self-pay | Admitting: Pulmonary Disease

## 2011-04-11 ENCOUNTER — Telehealth: Payer: Self-pay | Admitting: Gastroenterology

## 2011-04-11 ENCOUNTER — Ambulatory Visit (INDEPENDENT_AMBULATORY_CARE_PROVIDER_SITE_OTHER): Payer: Medicare Other | Admitting: Pulmonary Disease

## 2011-04-11 DIAGNOSIS — G894 Chronic pain syndrome: Secondary | ICD-10-CM

## 2011-04-11 DIAGNOSIS — I509 Heart failure, unspecified: Secondary | ICD-10-CM

## 2011-04-11 DIAGNOSIS — M545 Low back pain: Secondary | ICD-10-CM

## 2011-04-11 DIAGNOSIS — K5730 Diverticulosis of large intestine without perforation or abscess without bleeding: Secondary | ICD-10-CM

## 2011-04-11 DIAGNOSIS — J989 Respiratory disorder, unspecified: Secondary | ICD-10-CM

## 2011-04-11 DIAGNOSIS — I5032 Chronic diastolic (congestive) heart failure: Secondary | ICD-10-CM

## 2011-04-11 DIAGNOSIS — Q211 Atrial septal defect: Secondary | ICD-10-CM

## 2011-04-11 DIAGNOSIS — I6529 Occlusion and stenosis of unspecified carotid artery: Secondary | ICD-10-CM

## 2011-04-11 DIAGNOSIS — R109 Unspecified abdominal pain: Secondary | ICD-10-CM

## 2011-04-11 DIAGNOSIS — M1990 Unspecified osteoarthritis, unspecified site: Secondary | ICD-10-CM

## 2011-04-11 DIAGNOSIS — I2510 Atherosclerotic heart disease of native coronary artery without angina pectoris: Secondary | ICD-10-CM

## 2011-04-11 DIAGNOSIS — E7800 Pure hypercholesterolemia, unspecified: Secondary | ICD-10-CM

## 2011-04-11 NOTE — Progress Notes (Signed)
Subjective:    Patient ID: [name], female    DOB: 1919-02-08, 77 y.o.   MRN: [id number]  HPI  77 y/o WF here for a follow up visit... she has multiple medical problems including:  Hx Asthmatic bronchitis/ Pneumonia;  CAD & ASD repair;  AFib & Pacemaker;  Carotid Art dis;  Ven Insuffic;  Hyperchol;  Divertics;  s/p bilat inguinal hernia repairs 8/11;  Overactive bladder;  DJD/ LBP/ Osteoporosis;  Anemia & bilat rectus muscle hematomas 1/12...   ~  December 14, 2010:  Kallyn was hosp 1/21 - 12/10/10 by [facility] w/ RLL pneumonia- NOS & resolved w/ antibiotic Rx; Labs showed Anemia w/ Hg 11.0==>8.1, MCV~99 & Fe<10, Ferritin=167, B12=581, Folate=11, Protimes w/ INR= 2.1-2.9, & CTAbd w/ bilat rectus musc hematomas (?spontaneous hemorrhage);  Note: 3/11 Hg=12.0, & 8/11 Hg=11.8 (MCV=106);  Stool heme neg here today; last colon check 1995 & neg x divertics; she was disch off Coumadin, on FeSO4, not on PPI, & ret today for f/u labs> feeling weak, tired, congested, sl nausea, no vomiting, vague abd pains, & bruising; Note: Hospitalist & Radiologist didn't feel the rectus hematomas were large enough to account for the anemia (& acute bleed wouldn't lead to Fe<10); she denies any prev hint of bleeding- no melena, BRB, etc.    We plan f/u CXR (approaching baseline w/ min resid RLL markings & blunt angle); & LABS/ Anemia eval (Hg improved to 10.5, MCV=109, Fe=78 & 21%sat, PT INR=1.1, BNP=324, stool heme neg, SPE=neg).  ~  February 09, 2011:  2 month ROV & stable but she has persist GI symptoms- vague discomfort, swelling, constip "there's something there" she says> offered GI referral (she wants to hold off) vs incr laxatives w/ Miralax & Senakot-S to establish soft easy to pass BMs;  We added Omep 20mg /d last OV but she stopped it on her own & doesn't want to take it;  She saw DrKlein 2/12 for f/u AFib/ pacer, he reviewed her Echo from 1/12 & decided to leave the Coumadin off for now w/ rov in 51mo...    Resp>  She has  Mucinex for Prn use but not taking any regular breathing meds...    Cards>  She has CAD, Hx ASD repair, AFib, Pacer, Carotid dis, pulmHTN;  Coumadin on HOLD & taking Lasix40mg /d w/ K10Bid;  2DEcho 1/12 showed EF 65-70%, mild LVH, restricted AV motion with mean gradient of 15 mmHg, mod MR, mod-severe LAE and mod RAE, mod TR, mod PR, PASP 60...    Chol>  On Fish Oil supplements & last FLP 3/11 looked pretty good;  Her wt is stable at 92 lbs...     Ortho>  This has been her biggest complaint- DJD, LBP, known sp stenosis, L3 compression, osteoporosis;  She remains on Alendronate, Calcium, MVI, Vit D, & uses Tramadol & Tylenol for pain...  ~  Apr 11, 2011:  10mo ROV & she persists w/ mult somatic complaints- eg. Heel feels funny, fingertips numb, toenails turns yellow then back to normal, etc... She has persistant abd discomfort "a pressure" across her abd & assoc nausea- she wonders if related to her double hernia surg w/ mesh last yr? Recall hosp 1/12 w/ pneumonia & she developed anemia w/ Hg down to 8.1 & CT showed bilat rectus sheath hematoms R>L (? If this was enough to explain the anemia);  We discussed the NEED for further eval by GI at this point (as she has refused further eval previously but the symptoms  are persistant) & we will refer to GI...  Problem List:   RESPIRATORY DISORDER, CHRONIC (ICD-519.9) - on MUCINEX 600mg  1-2 Bid... she is a never smoker w/ hx obstructive asthmatic bronchitis over the years treated by DrESL... baseline CXR w/ overinflated lungs, decr markings, enlarged heart, NAD.... ~  CXR 9/11 in hosp showed cardiomeg, NAD. ~  Adm 1/12 w/ RLL pneumonia- NOS, improved w/ antibiotics back to baseline.  CORONARY ARTERY DISEASE (ICD-414.00) Hx of ATRIAL SEPTAL DEFECT (ICD-745.5) - she has known non-obstructive CAD and is followed by DrCooper... she had an ASD repair at [facility] in [facility]... she was on COUMADIN until 1/12 hosp w/ pneumonia> had therapeutic protimes w/ INR= 2.1-2.9 but  developed anemia (Hg=8.1), bilat rectus musc hematomas, Fe<10, etc... taken off Coumadin at that point. ~  last cath 1996 showed 40% Lmain, 30% proxLAD, 30%procRCA, normCIRC... ~  negative persantine cardiolite 10/97 with EF= 82%... ~  9/11:  she denies CP, palpit, syncope, change in SOB or edema...  ATRIAL FIBRILLATION (ICD-427.31) PACEMAKER, PERMANENT (ICD-V45.01) - she is followed by DrKlein prev on Coumadin as noted above... s/p AV junction ablation attempts x2 by DrKlein- now considered permanent AFib & pacer placed 3/05 for SSS, generator changed 12/09. ~  2DEcho 5/06 showed mild dil LA,  mild AI & AoV sclerosis, mod MR, EF= 65%... ~  EKG 1/11 showed Pacer rhythm... ~  seen by DrKlein 8/11- stable, pacer OK, no changes made. ~  2DEcho 1/12 showed mild LVH, norm wall motion & EF=65-70%, AoV thickened/ calcif/ restricted w/ mean gradient=15, RA & LA dilated, PAsys=73mmHg.  CAROTID BRUIT (ICD-785.9) - she had CDopplers 1996 showing mild irreg plaque bilat, no signif stenosis... she has a bruit on exam-  ~  CDopplers 9/09 showed mild irreg heterogeneous plaque, 0-39% bilat ICA stenoses... ~  CDoppler 9/10 showed similar findings w/ 0-39% RICA stenosis & 40-59% LICA stenosis... f/u 102yr. ~  CDoppler 9/11 showed same findings- repeat 46yr.  VENOUS INSUFFICIENCY (ICD-459.81) - she follows a low sodium diet and takes LASIX 40mg /d + MicroK-10 Bid...  HYPERCHOLESTEROLEMIA (ICD-272.0) - now off Vytorin due to muscle symptoms and doing much better... on Omega-3 Fish Oil-  ~  FLP 5/08 on Vytorin10-20 showed TChol 91, TG 47, HDL 41, LDL 41 ~  FLP 10/08 on Vytorin10-20 (1/2 tab daily) showed TChol 139, TG 58, HDL 58, LDL 70 ~  FLP 9/09 off Vytorin x35mo showed TChol 158, TG 38, HDL 46, LDL 104 ~  FLP 3/10 showed TChol 197, TG 51, HDL 57, LDL 129 ~  FLP 3/11 showed TChol 192, TG 86, HDL 70, LDL 105  DIVERTICULOSIS OF COLON (ICD-562.10) - FlexSig 1995 by DrPatterson showed divertics only...  INGUINAL  HERNIAs >>> s/p bilat open inguinal hernia repairs w/ mesh by Jean Rosenthal 8/11...  OVERACTIVE BLADDER (ICD-596.51) - she's on DETROL 2mg Bid per Hulda Humphrey...  DEGENERATIVE JOINT DISEASE (ICD-715.90) BACK PAIN, LUMBAR (ICD-724.2) - she has mod LBP and known spinal stenosis... she saw DrSypher in 2008 for osteoarthritis left hand w/ cyst on thumb (surg 10/08)... on TRAMADOL per DrRamos. ~  she has severe sp stenosis L4-5, & mod sp stenosis L3-4 >> CT scans of sp & hips showed severe scoliosis w/ advanced DDD & canal & foraminal stenosis, osteophytes, osteopenia, calcif vessels, divertics, sm left inguinal hernia. ~  3/11:  we tried Duragesic-25 patches for her pain but it made her nauseous, dizzy & loopy so she stopped. ~  6/11:  she tells me that DrRamos has referred her to Neuro-  DrYan> no neuropathy & no radiculopathy by report.  OSTEOPOROSIS (ICD-733.00) - she takes ALENDRONATE 70mg /wk, Calcium, Vits, etc... she has L3 compression. ~  BMD here 8/08 showed TScores +0.4 in Spine, and -3.4 in left FemNeck... ~  BMD here 9/10 showed TScores +0.9 in Spine, and -3.5 in [facility]  ANEMIA (ICD-285.9)  << SEE ABOVE >> ~  Hg nadir 1/12 hosp was 8.1 & Fe was <10 ~  Labs 3/12 showed Hg= 12.0, Fe= 82 (22%sat)  Past Medical History  Diagnosis Date  . Pneumonia, organism unspecified   . Unspecified disease of respiratory system   . CAD (coronary artery disease)   . Ostium secundum type atrial septal defect   . AV block   . Presence of permanent cardiac pacemaker   . Venous insufficiency   . Pure hypercholesterolemia   . Diverticulosis of colon (without mention of hemorrhage)   . Overactive bladder   . DJD (degenerative joint disease)   . LBP (low back pain)   . Chronic pain syndrome   . Osteoporosis   . Anemia     Past Surgical History  Procedure Date  . Vesicovaginal fistula closure w/ tah 1973  . Cataract extraction   . Asd repair   . Pacemaker placement 1996    St Jude  . Inguinal  hernia repair 2011    bilaterally    Outpatient Encounter Prescriptions as of 04/11/2011  Medication Sig Dispense Refill  . acetaminophen (TYLENOL) 500 MG tablet Take 500 mg by mouth every 6 (six) hours as needed. Take with tramadol as needed for pain       . alendronate (FOSAMAX) 70 MG tablet Take 70 mg by mouth every 7 (seven) days. Take with a full glass of water on an empty stomach.       . Ascorbic Acid (VITAMIN C) 1000 MG tablet Take 1,000 mg by mouth daily.        . Biotin 1000 MCG tablet Take 1,000 mcg by mouth daily.        . Calcium Carbonate-Vitamin D (CALCIUM 600+D) 600-400 MG-UNIT per tablet Take 2 tablets by mouth daily.        . Cholecalciferol (VITAMIN D) 1000 UNITS capsule Take 1,000 Units by mouth daily.        . ferrous sulfate 325 (65 FE) MG tablet Take 325 mg by mouth daily with breakfast.       . furosemide (LASIX) 40 MG tablet Take 1 tablet (40 mg total) by mouth daily. Or as directed  90 tablet  1  . Glucosamine 500 MG CAPS Take 1 capsule by mouth daily.        . Omega-3 350 MG CAPS Take 1 capsule by mouth daily.        . potassium chloride (KLOR-CON) 10 MEQ CR tablet Take 10 mEq by mouth 2 (two) times daily.        Marland Kitchen tolterodine (DETROL LA) 2 MG 24 hr capsule Take 2 mg by mouth 2 (two) times daily.        . traMADol (ULTRAM) 50 MG tablet Take 50 mg by mouth every 6 (six) hours as needed.        . vitamin A 8000 UNIT capsule Take 8,000 Units by mouth every other day.        . vitamin E 400 UNIT capsule Take 400 Units by mouth daily.        Marland Kitchen DISCONTD: omeprazole (PRILOSEC) 20 MG capsule Take 20 mg by mouth daily  before breakfast.    ==> she stopped on her own    . DISCONTD: warfarin (COUMADIN) 5 MG tablet Take by mouth as directed.    ==> stopped 1/12 hosp w/ anemia & rectus hematomas      Allergies  Allergen Reactions  . Amiodarone Hcl     REACTION: INTOL to Amiodarone in past  . Codeine   . Fentanyl     REACTION: causes nausea--dizziness    Review of  Systems        See HPI - all other systems neg except as noted... The patient complains of anorexia, decreased hearing, dyspnea on exertion, peripheral edema, prolonged cough, muscle weakness, and difficulty walking.  The patient denies fever, weight loss, weight gain, vision loss, hoarseness, chest pain, syncope, headaches, hemoptysis, abdominal pain, melena, hematochezia, severe indigestion/heartburn, hematuria, incontinence, suspicious skin lesions, transient blindness, depression, unusual weight change, abnormal bleeding, enlarged lymph nodes, and angioedema.    Objective:   Physical Exam     WD, Thin, 77 y/o WF in NAD... appears younger that stated age.  GENERAL:  Alert & oriented; pleasant & cooperative... HEENT:  [place]/AT, EOM-full, PERRLA, EACs-clear, TMs-wnl, NOSE-clear, THROAT-clear & wnl. NECK:  Supple w/ fairROM; no JVD; normal carotid impulses w/ 2+ bruit; no thyromegaly or nodules palpated; no lymphadenopathy. CHEST:  Decr BS bilat, clear to P & A; without wheezes/ rales/ or rhonchi heard... HEART:  Regular Rhythm- pacer; gr 1/6 SEM without rubs or gallops detected... ABDOMEN:  Soft & nontender; bilat inguinal hernia scars; normal bowel sounds; no organomegaly or mases palpated. EXT: without deformities, mod arthritic changes; no varicose veins/ +venous insuffic/ tr edema. NEURO:  CN's intact; no focal neuro deficits... DERM:  No lesions noted; no rash etc...   Assessment & Plan:   Persistant GI symptoms>  She has been reluctant to see GI to pursue the work up for this problem; she stopped Omep20 on her own; she had CT Abd 1/12 while in hosp- as described... We discussed need to proceed w/ GI eval & we will set up referral...  PULM>  Back to baseline w/ underlying AB etc...  Cardiac>  CAD/ Hx ASD repair/ AFib/ Pacer>  She is followed by DrKlein who has endorsed her stopping coumadin due to age & prev bleed...  CHOL>  On diet + FishOil...  DJD/ LBP/ Osteop>  On Tramadol,  Alendronate, Calcium, MVI, Vit D...  ANEMIA>  She has been repleted orally w/ Fe & VitC.Marland KitchenMarland Kitchen

## 2011-04-11 NOTE — Patient Instructions (Signed)
Today we updated your med list in EPIC...    Continue your current med the same for now...  Try the wrist splints to see if they help your hand symptoms- if not we will refer to neurology for the Nerve conduction Test later...  We will arrange for a GI eval to investigate the source of your stomach discomfort...  Call for any questions...  Let's plan a nother office visit recheck in 2-3 months, sooner if needed for problems.Marland KitchenMarland Kitchen

## 2011-04-12 ENCOUNTER — Telehealth: Payer: Self-pay | Admitting: *Deleted

## 2011-04-12 ENCOUNTER — Encounter: Payer: Self-pay | Admitting: *Deleted

## 2011-04-12 NOTE — Telephone Encounter (Signed)
Left message on machine to call back  

## 2011-04-12 NOTE — Telephone Encounter (Signed)
Rhonda from Dr Jodelle Green office called for an appt. Pt with new onset of abdominal pain, fullness and states something's not right. Hx of diverticulosis. Pt given an appt with Willette Cluster, NP on 04/16/11 at 3:30pm with Bjorn Loser.

## 2011-04-12 NOTE — Telephone Encounter (Signed)
Pt sch with Dr Christella Hartigan for 04/13/11 at 230 Rhonda to notify pt

## 2011-04-13 ENCOUNTER — Ambulatory Visit: Payer: Medicare Other | Admitting: Gastroenterology

## 2011-04-14 ENCOUNTER — Encounter: Payer: Self-pay | Admitting: Pulmonary Disease

## 2011-04-16 ENCOUNTER — Encounter: Payer: Self-pay | Admitting: Nurse Practitioner

## 2011-04-16 ENCOUNTER — Ambulatory Visit (INDEPENDENT_AMBULATORY_CARE_PROVIDER_SITE_OTHER): Payer: Medicare Other | Admitting: Nurse Practitioner

## 2011-04-16 VITALS — BP 146/50 | HR 88 | Ht <= 58 in | Wt 92.2 lb

## 2011-04-16 DIAGNOSIS — R1030 Lower abdominal pain, unspecified: Secondary | ICD-10-CM

## 2011-04-16 DIAGNOSIS — R109 Unspecified abdominal pain: Secondary | ICD-10-CM

## 2011-04-16 DIAGNOSIS — D649 Anemia, unspecified: Secondary | ICD-10-CM

## 2011-04-16 MED ORDER — CELECOXIB 100 MG PO CAPS: 100.0000 mg | ORAL_CAPSULE | Freq: Every day | ORAL | Status: DC

## 2011-04-16 NOTE — Patient Instructions (Signed)
We have given you an order to take to the lab in our basement level .  We sent a prescription for Celebrex to [facility]. Take 1 tab daily, we sent a prescription for 20 tablets. Call us aftera 5 days and let us know if you have had any improvement. Take the Prilosec, once daily before breakfast.  We made you a follow up appointment to see Willette Cluster ACNP  On 05-07-2011.  Dr Arlyce Dice will be the supervising MD that day.

## 2011-04-16 NOTE — Progress Notes (Signed)
04/16/2011 [name] [id number] [birth year]   HISTORY OF PRESENT ILLNESS:  Patient is a 77 year old female with a complex medical history who was seen here in the 1990s for what sounds like a colonoscopy. Patient here for evaluaton of abdominal pain and nausea since January 2012. The abdominal pain is located in her bilateral lower quadrants. It is intermittent, unrelated to meals or defecation. In addition to the pain, which often bothers her when sitting completely still, patient feels like "something is inside of her abdomen that should not be there".  Bowel movements okay after starting Miralax and Senna. No rectal bleeding. She has frequent episodes of nausea without vomiting. No significant weight loss. As previously mentioned, the pain began in January 2012 at which time patient was hospitalized for pneumonia. I reviewed hospital records.On admission to the hospital January 21, hemoglobin was 11 but it trended downward over 3 days to a nadir of 8.1. The patient was complaining of abdominal pain, a CT scan of the abdomen and pelvis with contrast  was obtained showing possible bilateral inferior rectus sheath hematomas, right greater than left. Patient was on Coumadin at the time but it has since been discontinued. Other findings included possible hepatic cirrhosis, sigmoid diverticulosis and bilateral pleural effusions. Since discharge patient has been on iron supplements. Her hemoglobin was up to 12.0 late March at which time iron supplement was reduced to once a day. I suspect some of her constipation was from iron supplements.   Past Medical History  Diagnosis Date  . Pneumonia, organism unspecified   . Unspecified disease of respiratory system   . CAD (coronary artery disease)   . Ostium secundum type atrial septal defect   . AV block   . Presence of permanent cardiac pacemaker   . Venous insufficiency   . Pure hypercholesterolemia   . Diverticulosis of colon (without mention of  hemorrhage)   . Overactive bladder   . DJD (degenerative joint disease)   . LBP (low back pain)   . Chronic pain syndrome   . Osteoporosis   . Anemia    Past Surgical History  Procedure Date  . Vesicovaginal fistula closure w/ tah 1973  . Cataract extraction   . Asd repair   . Pacemaker placement 1996    St Jude  . Inguinal hernia repair 2011    bilaterally  . Shoulder surgery     x 2  . Ganglion cyst excision   . Partial hysterectomy     reports that she has never smoked. She has never used smokeless tobacco. She reports that she does not drink alcohol or use illicit drugs. family history includes Breast cancer in her sister; Emphysema in her father; and Heart disease in her mother. Allergies  Allergen Reactions  . Amiodarone Hcl     REACTION: INTOL to Amiodarone in past  . Codeine   . Fentanyl     REACTION: causes nausea--dizziness      Outpatient Encounter Prescriptions as of 04/16/2011  Medication Sig Dispense Refill  . acetaminophen (TYLENOL) 500 MG tablet Take 650 mg by mouth every 6 (six) hours as needed. Take with tramadol as needed for pain      . alendronate (FOSAMAX) 70 MG tablet Take 70 mg by mouth every 7 (seven) days. Take with a full glass of water on an empty stomach.       . Ascorbic Acid (VITAMIN C) 1000 MG tablet Take 1,000 mg by mouth daily.        Marland Kitchen  Biotin 1000 MCG tablet Take 1,000 mcg by mouth daily.        . Calcium Carbonate-Vitamin D (CALCIUM 600+D) 600-400 MG-UNIT per tablet Take 2 tablets by mouth daily.        . Cholecalciferol (VITAMIN D) 1000 UNITS capsule Take 1,000 Units by mouth daily.        . ferrous sulfate 325 (65 FE) MG tablet Take 325 mg by mouth daily with breakfast.       . furosemide (LASIX) 40 MG tablet Take 1 tablet (40 mg total) by mouth daily. Or as directed  90 tablet  1  . Glucosamine 500 MG CAPS Take 1 capsule by mouth daily.        . Omega-3 350 MG CAPS Take 1 capsule by mouth daily.        . potassium chloride (KLOR-CON)  10 MEQ CR tablet Take 10 mEq by mouth 2 (two) times daily.        Marland Kitchen tolterodine (DETROL LA) 2 MG 24 hr capsule Take 2 mg by mouth 2 (two) times daily.        . traMADol (ULTRAM) 50 MG tablet Take 50 mg by mouth every 6 (six) hours as needed.        . vitamin A 8000 UNIT capsule Take 8,000 Units by mouth every other day.        . vitamin E 400 UNIT capsule Take 400 Units by mouth daily.           REVIEW OF SYSTEMS  : All other systems reviewed and negative except where noted in the History of Present Illness.   PHYSICAL EXAM: General: White female in no acute distress Head: Normocephalic and atraumatic Eyes:  sclerae anicteric,conjunctive pink. Ears: Hard of hearing.  Mouth: No deformity or lesions Neck: Supple, no masses.  Lungs: Clear throughout to auscultation Heart: Regular rate and rhythm; soft murmur  Abdomen: Soft, non distended, nontender. No masses or hepatomegaly noted. Normal Bowel sounds Rectal: Not done Musculoskeletal: Mild curvature of the spine Skin: No lesions on visible extremities Extremities: No edema or deformities noted Neurological: Alert oriented x 4, grossly nonfocal Cervical Nodes:  No significant cervical adenopathy Psychological:  Alert and cooperative. Normal mood and affect  ASSESSMENT AND PLAN;

## 2011-04-17 ENCOUNTER — Encounter: Payer: Self-pay | Admitting: Internal Medicine

## 2011-04-17 ENCOUNTER — Encounter: Payer: Self-pay | Admitting: Nurse Practitioner

## 2011-04-17 ENCOUNTER — Ambulatory Visit (INDEPENDENT_AMBULATORY_CARE_PROVIDER_SITE_OTHER): Payer: Medicare Other | Admitting: Internal Medicine

## 2011-04-17 ENCOUNTER — Encounter: Payer: Medicare Other | Admitting: Internal Medicine

## 2011-04-17 DIAGNOSIS — I442 Atrioventricular block, complete: Secondary | ICD-10-CM

## 2011-04-17 DIAGNOSIS — I4891 Unspecified atrial fibrillation: Secondary | ICD-10-CM

## 2011-04-17 DIAGNOSIS — Z950 Presence of cardiac pacemaker: Secondary | ICD-10-CM | POA: Insufficient documentation

## 2011-04-17 DIAGNOSIS — R1030 Lower abdominal pain, unspecified: Secondary | ICD-10-CM | POA: Insufficient documentation

## 2011-04-17 NOTE — Patient Instructions (Signed)
Your physician wants you to follow-up in: 6 months  You will receive a reminder letter in the mail two months in advance. If you don't receive a letter, please call our office to schedule the follow-up appointment.  Your physician recommends that you continue on your current medications as directed. Please refer to the Current Medication list given to you today.  

## 2011-04-17 NOTE — Assessment & Plan Note (Addendum)
Bilateral lower quadrant pain since January 2012 at which time  CT scan showed probable bilateral rectus sheath hematomas. While her pain in January could have been from the rectus sheath hematomas, that obviously is not the case at present. Her abdominal exam is unremarkable. Her pain is not related to meals or defecation. Patient had bilateral inguinal hernia repair in August 2011, I wonder about adhesive disease. This may be musculoskeletal pain. She has significant lumbar disease, ? radicular pain. Of course colon pathology is not excluded and colonoscopy was discussed with the patient but the diagnostic yield when done for abdominal pain is not likely to be high. Will give her a 5-7 day trial of Celebrex to take with a PPI. She will continue her current regimen of Ultram and Tylenol . Patient will call us on Monday morning for a condition update.

## 2011-04-17 NOTE — Assessment & Plan Note (Signed)
The patient's device was interrogated.  The information was reviewed. No changes were made in the programming.    

## 2011-04-17 NOTE — Assessment & Plan Note (Addendum)
Permanent. She's had problems with rectal sheath hematoma. Hence I think we'll probably not resume Coumadin. We will consider the use of apixoban when itis released

## 2011-04-17 NOTE — Assessment & Plan Note (Addendum)
Acute drop in her hemoglobin while hospitalized in January of this year (hemoglobin declined from 11 to 8.0). Patient had concurrent abdominal pain and CT scan revealed possible bilateral inferior rectus sheath hematomas. Patient was on Coumadin at the time, that has since been discontinued. Hemoglobin up to 12.0 in late March on iron supplements. Of note, MCV was normal but serum iron less than 10. Ferritin 167, her folate and vitamin B12 levels were normal. Acute drop in H&H likely related to rectus sheath hematomas. No history of overt GI bleeding. Patient will be given Hemoccult cards to return. If they are positive, and/or hemoglobin declines in the future, then patient may need endoscopic workup.

## 2011-04-17 NOTE — Assessment & Plan Note (Signed)
Stable post pacemaker 

## 2011-04-17 NOTE — Progress Notes (Signed)
HPI  [name] is a 77 y.o. female seen in followup for   permanent atrial fibrillation. She is status post AV junction on 2 separate occasions and has a previously implanted pacemaker.  The patient denies chest pain, shortness of breath, nocturnal dyspnea, orthopnea or peripheral edema.  There have been no palpitations, lightheadedness or syncope. Major problems remains disability 2/2 back     Echo done 12/03/10 demonstrated EF 65-70%; mild LVH; restricted AV motion with mean gradient of 15 mmHg; mod MR; mod-severe LAE and mod RAE; mod TR; mod PR; PASP 60.  Na 136 following SIADH in the hospital K 3.6, Creat 0.60 (12/10/10)  Past Medical History  Diagnosis Date  . Pneumonia, organism unspecified   . Unspecified disease of respiratory system   . CAD (coronary artery disease)   . Ostium secundum type atrial septal defect   . AV block   . Presence of permanent cardiac pacemaker   . Venous insufficiency   . Pure hypercholesterolemia   . Diverticulosis of colon (without mention of hemorrhage)   . Overactive bladder   . DJD (degenerative joint disease)   . LBP (low back pain)   . Chronic pain syndrome   . Osteoporosis   . Anemia     Past Surgical History  Procedure Date  . Vesicovaginal fistula closure w/ tah 1973  . Cataract extraction   . Asd repair   . Pacemaker placement 1996    St Jude  . Inguinal hernia repair 2011    bilaterally  . Shoulder surgery     x 2  . Ganglion cyst excision   . Partial hysterectomy     Current Outpatient Prescriptions  Medication Sig Dispense Refill  . acetaminophen (TYLENOL) 500 MG tablet Take 650 mg by mouth every 6 (six) hours as needed. Take with tramadol as needed for pain      . alendronate (FOSAMAX) 70 MG tablet Take 70 mg by mouth every 7 (seven) days. Take with a full glass of water on an empty stomach.       . Ascorbic Acid (VITAMIN C) 1000 MG tablet Take 1,000 mg by mouth daily.        . Biotin 1000 MCG tablet Take 1,000 mcg  by mouth daily.        . Calcium Carbonate-Vitamin D (CALCIUM 600+D) 600-400 MG-UNIT per tablet Take 2 tablets by mouth daily.        . celecoxib (CELEBREX) 100 MG capsule Take 1 capsule (100 mg total) by mouth daily.  20 capsule  0  . Cholecalciferol (VITAMIN D) 1000 UNITS capsule Take 1,000 Units by mouth daily.        . ferrous sulfate 325 (65 FE) MG tablet Take 325 mg by mouth daily with breakfast.       . furosemide (LASIX) 40 MG tablet Take 1 tablet (40 mg total) by mouth daily. Or as directed  90 tablet  1  . Glucosamine 500 MG CAPS Take 1 capsule by mouth daily.        . Omega-3 350 MG CAPS Take 1 capsule by mouth daily.        Marland Kitchen omeprazole (PRILOSEC) 20 MG capsule Take 20 mg by mouth daily.        . potassium chloride (KLOR-CON) 10 MEQ CR tablet Take 10 mEq by mouth 2 (two) times daily.        Marland Kitchen tolterodine (DETROL LA) 2 MG 24 hr capsule Take 2 mg by mouth 2 (  two) times daily.        . traMADol (ULTRAM) 50 MG tablet Take 50 mg by mouth every 6 (six) hours as needed.        . vitamin A 8000 UNIT capsule Take 8,000 Units by mouth every other day.        . vitamin E 400 UNIT capsule Take 400 Units by mouth daily.          Allergies  Allergen Reactions  . Amiodarone Hcl     REACTION: INTOL to Amiodarone in past  . Codeine   . Fentanyl     REACTION: causes nausea--dizziness    Review of Systems negative except from HPI and PMH  Physical Exam Well developed and well nourished in no acute distress HENT normal E scleral and icterus clear Neck Supple makred kyphosis Clear to ausculation Regular rate and rhythm, no murmurs gallops or rub Soft with active bowel sounds No clubbing cyanosis and edema Alert and oriented, grossly normal motor and sensory function Skin Warm and Dry  ECG  Assessment and  Plan

## 2011-04-18 NOTE — Progress Notes (Signed)
Reviewed and agree with management. Kele Withem D. Cabria Micalizzi, M.D., FACG  

## 2011-04-23 ENCOUNTER — Telehealth: Payer: Self-pay | Admitting: Nurse Practitioner

## 2011-04-23 NOTE — Telephone Encounter (Signed)
Called pt and she wanted me to know that the Prilosec has helped her stomach. She is feeling some better.  The Celebrex hasn't helped much yet but she know that may take a little time.  I asked her to call me the end of the week with an update.

## 2011-04-24 ENCOUNTER — Other Ambulatory Visit: Payer: Self-pay | Admitting: Nurse Practitioner

## 2011-04-24 ENCOUNTER — Other Ambulatory Visit: Payer: Medicare Other

## 2011-04-24 DIAGNOSIS — Z1211 Encounter for screening for malignant neoplasm of colon: Secondary | ICD-10-CM

## 2011-05-07 ENCOUNTER — Other Ambulatory Visit (INDEPENDENT_AMBULATORY_CARE_PROVIDER_SITE_OTHER): Payer: Medicare Other

## 2011-05-07 ENCOUNTER — Ambulatory Visit (INDEPENDENT_AMBULATORY_CARE_PROVIDER_SITE_OTHER): Payer: Medicare Other | Admitting: Nurse Practitioner

## 2011-05-07 ENCOUNTER — Encounter: Payer: Self-pay | Admitting: Nurse Practitioner

## 2011-05-07 VITALS — BP 128/64 | HR 60 | Ht 59.0 in | Wt 90.0 lb

## 2011-05-07 DIAGNOSIS — D649 Anemia, unspecified: Secondary | ICD-10-CM

## 2011-05-07 DIAGNOSIS — IMO0001 Reserved for inherently not codable concepts without codable children: Secondary | ICD-10-CM

## 2011-05-07 DIAGNOSIS — I4891 Unspecified atrial fibrillation: Secondary | ICD-10-CM

## 2011-05-07 DIAGNOSIS — R109 Unspecified abdominal pain: Secondary | ICD-10-CM

## 2011-05-07 DIAGNOSIS — M7918 Myalgia, other site: Secondary | ICD-10-CM

## 2011-05-07 DIAGNOSIS — R1030 Lower abdominal pain, unspecified: Secondary | ICD-10-CM

## 2011-05-07 LAB — CBC WITH DIFFERENTIAL/PLATELET
Basophils Absolute: 0 10*3/uL (ref 0.0–0.1)
HCT: 35.5 % — ABNORMAL LOW (ref 36.0–46.0)
Hemoglobin: 12.2 g/dL (ref 12.0–15.0)
Lymphs Abs: 1.8 10*3/uL (ref 0.7–4.0)
MCV: 107.4 fl — ABNORMAL HIGH (ref 78.0–100.0)
Monocytes Absolute: 0.5 10*3/uL (ref 0.1–1.0)
Neutro Abs: 2.9 10*3/uL (ref 1.4–7.7)
Platelets: 173 10*3/uL (ref 150.0–400.0)
RDW: 14 % (ref 11.5–14.6)

## 2011-05-07 NOTE — Assessment & Plan Note (Signed)
Acute drop in hemoglobin during January 2012 hospitalization was likely secondary to bilateral rectus sheath hematomas. Hemoglobin 12.0 [place] March 20 12th. Recent iFOBT negative. No overt bleeding. We'll recheck hemoglobin today. Patient currently down to one iron tablet a day under the direction of Dr. Kriste Basque. If hemoglobin has remained stable will hold off on any endoscopic workup. Patient will be called with results of CBC and any further recommendations.

## 2011-05-07 NOTE — Assessment & Plan Note (Signed)
Lower abdominal pain has resolved after a trial of Celebrex. Patient has chronic lower back problems and there was a question of whether her lower abdominal pain was radicular. Interestingly, Celebrex helped her abdominal pain but she is having right hip pain radiating down her right leg. Patient follows up with her orthopedic doctor tomorrow.

## 2011-05-07 NOTE — Progress Notes (Signed)
ANGELLI BARUCH [id number] 1919/05/28   HISTORY OR PRESENT ILLNESS : Ms. Little is a 77 year old female with multiple medical problems. I saw her n the office a couple of weeks ago for evaluation of anemia, abdominal pain and nausea present since January 2012. Patient had been hospitalized in January and was found during that admission to have an acute drop in her hemoglobin. CT scan of the abdomen and pelvis suggested possible bilateral rectus sheath hematomas. Patient had been on Coumadin at that time. After obtaining a history and physical on the patient at our visit a couple weeks ago, I felt her abdominal pain was likely musculoskeletal in nature. The patient has a history of significant lumbar disease and I wondered about radicular pain. Patient was given a 5-7 day trial of Celebrex to take with a proton pump inhibitor. As far as her anemia, her acute drop in hemoglobin while hospitalized was felt to be secondary to the rectus sheath hematomas. Post hospitalization the patient had been placed on iron and her hemoglobin was up to 12 by the end of March. I asked to complete a iFOBT.  She comes in today for reevaluation. Her iFOBT was negative. Her abdominal pain has resolved but she now has right hip pain radiating down right leg. Bowel movements okay. She is still on Miralax and Senokot for constipaiton which was started after iron supplement lead to constipation.  Current Medications, Allergies, Past Medical History, Past Surgical History, Family History and Social History were reviewed in [employer] record.   PHYSICAL EXAMINATION : General: Petite white female in no acute distress Head: Normocephalic and atraumatic Eyes:  sclerae anicteric,conjunctive pink. Ears: Hard of hearing Mouth: No deformity or lesions Neck: Supple, no masses.  Lungs: Clear throughout to auscultation Heart: Regular rate and rhythm; no murmurs heard Abdomen: Soft, nondistended, nontender. Normal bowel sounds Rectal: Not done Musculoskeletal: Symmetrical with no gross deformities  Skin: No lesions on visible extremities Extremities: No edema or deformities noted Neurological: Alert oriented x 4, grossly nonfocal Cervical Nodes:  No significant cervical adenopathy Psychological:  Alert and cooperative. Quiet  ASSESSMENT AND PLAN :

## 2011-05-07 NOTE — Assessment & Plan Note (Signed)
Patient had what appeared to be bilateral rectus sheath hematomas in January 2012 while on Coumadin. Currently not on any anticoagulants.

## 2011-05-07 NOTE — Patient Instructions (Signed)
Please go to the basement level to have your labs drawn.  Call us if you have any recurrent abdominal pain.

## 2011-05-08 NOTE — Progress Notes (Signed)
Reviewed and agree with management. Sinjin Amero D. Dimond Crotty, M.D., FACG  

## 2011-07-17 ENCOUNTER — Ambulatory Visit: Payer: Medicare Other | Admitting: Pulmonary Disease

## 2011-07-17 ENCOUNTER — Encounter: Payer: Self-pay | Admitting: Internal Medicine

## 2011-07-17 DIAGNOSIS — I495 Sick sinus syndrome: Secondary | ICD-10-CM

## 2011-07-25 ENCOUNTER — Ambulatory Visit (INDEPENDENT_AMBULATORY_CARE_PROVIDER_SITE_OTHER): Payer: Medicare Other | Admitting: Pulmonary Disease

## 2011-07-25 ENCOUNTER — Encounter: Payer: Self-pay | Admitting: Pulmonary Disease

## 2011-07-25 ENCOUNTER — Telehealth: Payer: Self-pay | Admitting: *Deleted

## 2011-07-25 DIAGNOSIS — I4891 Unspecified atrial fibrillation: Secondary | ICD-10-CM

## 2011-07-25 DIAGNOSIS — M810 Age-related osteoporosis without current pathological fracture: Secondary | ICD-10-CM

## 2011-07-25 DIAGNOSIS — I509 Heart failure, unspecified: Secondary | ICD-10-CM

## 2011-07-25 DIAGNOSIS — M545 Low back pain: Secondary | ICD-10-CM

## 2011-07-25 DIAGNOSIS — Z950 Presence of cardiac pacemaker: Secondary | ICD-10-CM

## 2011-07-25 DIAGNOSIS — N318 Other neuromuscular dysfunction of bladder: Secondary | ICD-10-CM

## 2011-07-25 DIAGNOSIS — M7918 Myalgia, other site: Secondary | ICD-10-CM

## 2011-07-25 DIAGNOSIS — I5032 Chronic diastolic (congestive) heart failure: Secondary | ICD-10-CM

## 2011-07-25 DIAGNOSIS — Z23 Encounter for immunization: Secondary | ICD-10-CM

## 2011-07-25 DIAGNOSIS — M1990 Unspecified osteoarthritis, unspecified site: Secondary | ICD-10-CM

## 2011-07-25 DIAGNOSIS — G894 Chronic pain syndrome: Secondary | ICD-10-CM

## 2011-07-25 DIAGNOSIS — D649 Anemia, unspecified: Secondary | ICD-10-CM

## 2011-07-25 DIAGNOSIS — I2510 Atherosclerotic heart disease of native coronary artery without angina pectoris: Secondary | ICD-10-CM

## 2011-07-25 DIAGNOSIS — E7800 Pure hypercholesterolemia, unspecified: Secondary | ICD-10-CM

## 2011-07-25 DIAGNOSIS — K5730 Diverticulosis of large intestine without perforation or abscess without bleeding: Secondary | ICD-10-CM

## 2011-07-25 MED ORDER — DICYCLOMINE HCL 20 MG PO TABS
20.0000 mg | ORAL_TABLET | Freq: Three times a day (TID) | ORAL | Status: DC | PRN
Start: 2011-07-25 — End: 2011-07-25

## 2011-07-25 MED ORDER — DICYCLOMINE HCL 20 MG PO TABS: 20.0000 mg | ORAL_TABLET | Freq: Three times a day (TID) | ORAL | Status: DC | PRN

## 2011-07-25 NOTE — Patient Instructions (Signed)
Today we updated your med list in EPIC...    We wrote a new prescription for BENTYL (Dicyclomine) to take up to 3 times daily for abd cramping...  Try to adjust the dose of Miralax & Senakot to relieve the constipation w/o causing gas or nausea...  We will arrange for an Orthopedic Hand team evaluation to see if you need surgery for your hand/wrist pain...  Call for any questions...  Let's plan a follow up visit in 4 months or so w/ FASTING blood work at that time.Marland KitchenMarland Kitchen

## 2011-07-25 NOTE — Telephone Encounter (Signed)
Bentyl sent to [facility] in error, called and d/c'd same with [facility]. Sent to [facility] on [address].

## 2011-07-25 NOTE — Progress Notes (Signed)
Subjective:    Patient ID: [name], female    DOB: 06-19-1919, 77 y.o.   MRN: [id number]  HPI 77 y/o WF here for a follow up visit... she has multiple medical problems including:  Hx Asthmatic bronchitis/ Pneumonia;  CAD & ASD repair;  AFib & Pacemaker;  Carotid Art dis;  Ven Insuffic;  Hyperchol;  Divertics;  s/p bilat inguinal hernia repairs 8/11;  Overactive bladder;  DJD/ LBP/ Osteoporosis;  Anemia & bilat rectus muscle hematomas 1/12...   ~  December 14, 2010:  [name] was hosp 1/21 - 12/10/10 by [facility] w/ RLL pneumonia- NOS & resolved w/ antibiotic Rx; Labs showed Anemia w/ Hg 11.0==>8.1, MCV~99 & Fe<10, Ferritin=167, B12=581, Folate=11, Protimes w/ INR= 2.1-2.9, & CTAbd w/ bilat rectus musc hematomas (?spontaneous hemorrhage);  Note: 3/11 Hg=12.0, & 8/11 Hg=11.8 (MCV=106);  Stool heme neg here today; last colon check 1995 & neg x divertics;  She was disch off Coumadin, on FeSO4, not on PPI, & ret today for f/u labs> feeling weak, tired, congested, sl nausea, no vomiting, vague abd pains, & bruising; Note: Hospitalist & Radiologist didn't feel the rectus hematomas were large enough to account for the anemia (& acute bleed wouldn't lead to Fe<10); she denies any prev hint of bleeding- no melena, BRB, etc.    We plan f/u CXR (approaching baseline w/ min resid RLL markings & blunt angle); & LABS/ Anemia eval (Hg improved to 10.5, MCV=109, Fe=78 & 21%sat, PT INR=1.1, BNP=324, stool heme neg, SPE=neg).  ~  February 09, 2011:  2 month ROV & stable but she has persist GI symptoms- vague discomfort, swelling, constip "there's something there" she says> offered GI referral (she wants to hold off) vs incr laxatives w/ Miralax & Senakot-S to establish soft easy to pass BMs;  We added Omep 20mg /d last OV but she stopped it on her own & doesn't want to take it;  She saw DrKlein 2/12 for f/u AFib/ pacer, he reviewed her Echo from 1/12 & decided to leave the Coumadin off for now w/ rov in 85mo...    Resp>  She has  Mucinex for Prn use but not taking any regular breathing meds...    Cards>  She has CAD, Hx ASD repair, AFib, Pacer, Carotid dis, pulmHTN;  Coumadin on HOLD & taking Lasix40mg /d w/ K10Bid;  2DEcho 1/12 showed EF 65-70%, mild LVH, restricted AV motion with mean gradient of 15 mmHg, mod MR, mod-severe LAE and mod RAE, mod TR, mod PR, PASP 60...    Chol>  On Fish Oil supplements & last FLP 3/11 looked pretty good;  Her wt is stable at 92 lbs...     Ortho>  This has been her biggest complaint- DJD, LBP, known sp stenosis, L3 compression, osteoporosis;  She remains on Alendronate, Calcium, MVI, Vit D, & uses Tramadol & Tylenol for pain...  ~  Apr 11, 2011:  58mo ROV & she persists w/ mult somatic complaints- eg. Heel feels funny, fingertips numb, toenails turns yellow then back to normal, etc... She has persistant abd discomfort "a pressure" across her abd & assoc nausea- she wonders if related to her double hernia surg w/ mesh last yr? Recall hosp 1/12 w/ pneumonia & she developed anemia w/ Hg down to 8.1 & CT showed bilat rectus sheath hematoms R>L (? If this was enough to explain the anemia);  We discussed the NEED for further eval by GI at this point (as she has refused further eval previously but the symptoms  are persistant) & we will refer to GI...  ~  July 25, 2011:  3-60mo ROV & she was eval by PGuenter/ DrKaplan in GI> for 2 problems: her persistent abd symptoms (bloating, gas, crampy pain, constip, nausea, etc); and her anemia w/ Fe<10 during the prev hosp; they felt that the abd pain was musculoskeletal in nature & rec Celebrex trial + Prilosec, and she really doesn't feel that this helped much she says; stools were neg for blood; Hg improved to 12.2; no further work up recommended...  Today she notes some lower abd cramping pain off & on assoc w/ some gas, nausea, etc> we discussed trying BENTYL 20mg  Tid to see if this helps...    She saw DrKlein 6/12 for f/u AFib, heart block w/ pacer> he decided  to leave her off the coumadin & pacer check was fine...    She saw DrRamos 7/12 for f/u of her severe spinal stenosis at L4-5 w/ selective nerve root blocks attempted w/ some temporary benefit...    She is also c/o persistant wrist & hand symptoms w/ paresthesias & pain suggestive of CTS> we rec wrist splints in the past but says the discomfort is no better, therefore we will need to refer to hand surg (DrGramig) for NCV & consideration of surg...          Problem List:   RESPIRATORY DISORDER, CHRONIC (ICD-519.9) - on MUCINEX 600mg  1-2 Bid... she is a never smoker w/ hx obstructive asthmatic bronchitis over the years treated by DrESL... baseline CXR w/ overinflated lungs, decr markings, enlarged heart, NAD.... ~  CXR 9/11 in hosp showed cardiomeg, NAD. ~  Adm 1/12 w/ RLL pneumonia- NOS, improved w/ antibiotics back to baseline.  CORONARY ARTERY DISEASE (ICD-414.00) Hx of ATRIAL SEPTAL DEFECT (ICD-745.5) - she has known non-obstructive CAD and is followed by DrCooper... she had an ASD repair at [facility] in [facility]... she was on COUMADIN until 1/12 hosp w/ pneumonia> had therapeutic protimes w/ INR= 2.1-2.9 but developed anemia (Hg=8.1), bilat rectus musc hematomas, Fe<10, etc... taken off Coumadin at that point. ~  last cath 1996 showed 40% Lmain, 30% proxLAD, 30%procRCA, normCIRC... ~  negative persantine cardiolite 10/97 with EF= 82%... ~  9/11:  she denies CP, palpit, syncope, change in SOB or edema...  ATRIAL FIBRILLATION (ICD-427.31) PACEMAKER, PERMANENT (ICD-V45.01) - she is followed by DrKlein prev on Coumadin as noted above... s/p AV junction ablation attempts x2 by DrKlein- now considered permanent AFib & pacer placed 3/05 for SSS, generator changed 12/09. ~  2DEcho 5/06 showed mild dil LA,  mild AI & AoV sclerosis, mod MR, EF= 65%... ~  EKG 1/11 showed Pacer rhythm... ~  seen by DrKlein 8/11- stable, pacer OK, no changes made. ~  2DEcho 1/12 showed mild LVH, norm wall motion & EF=65-70%,  AoV thickened/ calcif/ restricted w/ mean gradient=15, RA & LA dilated, PAsys=20mmHg.  CAROTID BRUIT (ICD-785.9) - she had CDopplers 1996 showing mild irreg plaque bilat, no signif stenosis... she has a bruit on exam-  ~  CDopplers 9/09 showed mild irreg heterogeneous plaque, 0-39% bilat ICA stenoses... ~  CDoppler 9/10 showed similar findings w/ 0-39% RICA stenosis & 40-59% LICA stenosis... f/u 77yr. ~  CDoppler 9/11 showed same findings- repeat 57yr.  VENOUS INSUFFICIENCY (ICD-459.81) - she follows a low sodium diet and takes LASIX 40mg /d + MicroK-10 Bid...  HYPERCHOLESTEROLEMIA (ICD-272.0) - now off Vytorin due to muscle symptoms and doing much better... on Omega-3 Fish Oil-  ~  FLP 5/08 on Vytorin10-20  showed TChol 91, TG 47, HDL 41, LDL 41 ~  FLP 10/08 on Vytorin10-20 (1/2 tab daily) showed TChol 139, TG 58, HDL 58, LDL 70 ~  FLP 9/09 off Vytorin x56mo showed TChol 158, TG 38, HDL 46, LDL 104 ~  FLP 3/10 showed TChol 197, TG 51, HDL 57, LDL 129 ~  FLP 3/11 showed TChol 192, TG 86, HDL 70, LDL 105  DIVERTICULOSIS OF COLON (ICD-562.10) - FlexSig 1995 by DrPatterson showed divertics only...  INGUINAL HERNIAs >>> s/p bilat open inguinal hernia repairs w/ mesh by Jean Rosenthal 8/11...  OVERACTIVE BLADDER (ICD-596.51) - she's on DETROL 2mg Bid per Hulda Humphrey...  DEGENERATIVE JOINT DISEASE (ICD-715.90) BACK PAIN, LUMBAR (ICD-724.2) - she has mod LBP and known spinal stenosis... she saw DrSypher in 2008 for osteoarthritis left hand w/ cyst on thumb (surg 10/08)... on TRAMADOL per DrRamos. ~  she has severe sp stenosis L4-5, & mod sp stenosis L3-4 >> CT scans of sp & hips showed severe scoliosis w/ advanced DDD & canal & foraminal stenosis, osteophytes, osteopenia, calcif vessels, divertics, sm left inguinal hernia. ~  3/11:  we tried Duragesic-25 patches for her pain but it made her nauseous, dizzy & loopy so she stopped. ~  6/11:  she tells me that DrRamos has referred her to Neuro- DrYan> no  neuropathy & no radiculopathy by report. ~  7/12:  DrRamos tried selective nerve root block L4-5 right; she reports min benefit...  OSTEOPOROSIS (ICD-733.00) - she takes ALENDRONATE 70mg /wk, Calcium, Vits, etc... she has L3 compression. ~  BMD here 8/08 showed TScores +0.4 in Spine, and -3.4 in left FemNeck... ~  BMD here 9/10 showed TScores +0.9 in Spine, and -3.5 in [facility]  ANEMIA (ICD-285.9)  << SEE ABOVE >> ~  Hg nadir 1/12 hosp was 8.1 & Fe was <10 ~  Labs 3/12 showed Hg= 12.0, Fe= 82 (22%sat) ~  Labs 6/12 showed Hg= 12.2, MCV= 107, stool neg for occult blood.   Past Medical History  Diagnosis Date  . Pneumonia, organism unspecified   . Unspecified disease of respiratory system   . CAD (coronary artery disease)   . Ostium secundum type atrial septal defect   . AV block   . Presence of permanent cardiac pacemaker   . Venous insufficiency   . Pure hypercholesterolemia   . Diverticulosis of colon (without mention of hemorrhage)   . Overactive bladder   . DJD (degenerative joint disease)   . LBP (low back pain)   . Chronic pain syndrome   . Osteoporosis   . Anemia     Past Surgical History  Procedure Date  . Vesicovaginal fistula closure w/ tah 1973  . Cataract extraction   . Asd repair   . Pacemaker placement 1996    [employer]  . Inguinal hernia repair 2011    bilaterally  . Shoulder surgery     x 2  . Ganglion cyst excision   . Partial hysterectomy     Outpatient Encounter Prescriptions as of 07/25/2011  Medication Sig Dispense Refill  . acetaminophen (TYLENOL) 500 MG tablet Take 650 mg by mouth every 6 (six) hours as needed. Take with tramadol as needed for pain      . alendronate (FOSAMAX) 70 MG tablet Take 70 mg by mouth every 7 (seven) days. Take with a full glass of water on an empty stomach.       . Ascorbic Acid (VITAMIN C) 1000 MG tablet Take 1,000 mg by mouth daily.        Marland Kitchen  Biotin 1000 MCG tablet Take 1,000 mcg by mouth daily.        . Calcium  Carbonate-Vitamin D (CALCIUM 600+D) 600-400 MG-UNIT per tablet Take 2 tablets by mouth daily.        . Cholecalciferol (VITAMIN D) 1000 UNITS capsule Take 1,000 Units by mouth daily.        . ferrous sulfate 325 (65 FE) MG tablet Take 325 mg by mouth daily with breakfast.       . furosemide (LASIX) 40 MG tablet Take 1 tablet (40 mg total) by mouth daily. Or as directed  90 tablet  1  . Glucosamine 500 MG CAPS Take 1 capsule by mouth daily.        . Omega-3 350 MG CAPS Take 1 capsule by mouth daily.        Marland Kitchen omeprazole (PRILOSEC) 20 MG capsule Take 20 mg by mouth daily.        . potassium chloride (KLOR-CON) 10 MEQ CR tablet Take 10 mEq by mouth 2 (two) times daily.        Marland Kitchen tolterodine (DETROL LA) 2 MG 24 hr capsule Take 2 mg by mouth 2 (two) times daily.        . traMADol (ULTRAM) 50 MG tablet Take 50 mg by mouth every 6 (six) hours as needed.        . vitamin A 8000 UNIT capsule Take 8,000 Units by mouth every other day.        . vitamin E 400 UNIT capsule Take 400 Units by mouth daily.        . celecoxib (CELEBREX) 100 MG capsule Take 1 capsule (100 mg total) by mouth daily.  20 capsule  0  . dicyclomine (BENTYL) 20 MG tablet Take 1 tablet (20 mg total) by mouth 3 (three) times daily as needed (abdominal cramping).  90 tablet  1  . DISCONTD: dicyclomine (BENTYL) 20 MG tablet Take 1 tablet (20 mg total) by mouth 3 (three) times daily as needed (abdominal cramping).  90 tablet  5    Allergies  Allergen Reactions  . Amiodarone Hcl     REACTION: INTOL to Amiodarone in past  . Codeine   . Fentanyl     REACTION: causes nausea--dizziness    Review of Systems        See HPI - all other systems neg except as noted... The patient complains of anorexia, decreased hearing, dyspnea on exertion, peripheral edema, prolonged cough, muscle weakness, and difficulty walking.  The patient denies fever, weight loss, weight gain, vision loss, hoarseness, chest pain, syncope, headaches, hemoptysis,  abdominal pain, melena, hematochezia, severe indigestion/heartburn, hematuria, incontinence, suspicious skin lesions, transient blindness, depression, unusual weight change, abnormal bleeding, enlarged lymph nodes, and angioedema.    Objective:   Physical Exam     WD, Thin, 77 y/o WF in NAD... appears younger that stated age.  GENERAL:  Alert & oriented; pleasant & cooperative... HEENT:  /AT, EOM-full, PERRLA, EACs-clear, TMs-wnl, NOSE-clear, THROAT-clear & wnl. NECK:  Supple w/ fairROM; no JVD; normal carotid impulses w/ 2+ bruit; no thyromegaly or nodules palpated; no lymphadenopathy. CHEST:  Decr BS bilat, clear to P & A; without wheezes/ rales/ or rhonchi heard... HEART:  Regular Rhythm- pacer; gr 1/6 SEM without rubs or gallops detected... ABDOMEN:  Soft & nontender; bilat inguinal hernia scars; normal bowel sounds; no organomegaly or mases palpated. EXT: without deformities, mod arthritic changes; no varicose veins/ +venous insuffic/ tr edema. NEURO:  CN's intact; no focal  neuro deficits... DERM:  No lesions noted; no rash etc...   Assessment & Plan:   Persistent GI symptoms>  She had CT Abd 1/12 while in hosp- as described; She has had a GI eval from PGuenter & DrKaplan- notes reviewed & they do not plan any further w/u at this time; Suggest trial BENTYL 20mg  Tid & adjust Miralax & Senakot dosing...  Persistent bilat wrist & hand symptoms>  Suggestive of CTS but pt states no better after prolonged wrist splint therapy; we discussed need for NCVs & referral to hand surg for further consideration...  PULM>  Back to baseline w/ underlying AB etc...  Cardiac>  CAD/ Hx ASD repair/ AFib/ Pacer>  She is followed by DrKlein who has endorsed her stopping Coumadin due to age & prev bleed...  CHOL>  On diet + FishOil...  DJD/ LBP/ Osteop>  On Tramadol, Alendronate, Calcium, MVI, Vit D...  ANEMIA>  She has been repleted orally w/ Fe & VitC.Marland KitchenMarland Kitchen

## 2011-08-02 ENCOUNTER — Other Ambulatory Visit: Payer: Self-pay | Admitting: Pulmonary Disease

## 2011-08-02 ENCOUNTER — Encounter: Payer: Medicare Other | Admitting: Cardiology

## 2011-08-02 DIAGNOSIS — I6529 Occlusion and stenosis of unspecified carotid artery: Secondary | ICD-10-CM

## 2011-08-06 ENCOUNTER — Encounter (INDEPENDENT_AMBULATORY_CARE_PROVIDER_SITE_OTHER): Payer: Medicare Other | Admitting: *Deleted

## 2011-08-06 DIAGNOSIS — I6529 Occlusion and stenosis of unspecified carotid artery: Secondary | ICD-10-CM

## 2011-08-10 ENCOUNTER — Encounter: Payer: Medicare Other | Admitting: *Deleted

## 2011-08-21 LAB — CBC
HCT: 37.4
Hemoglobin: 12.8
MCHC: 34.2
MCV: 102.2 — ABNORMAL HIGH
RDW: 13.3

## 2011-08-21 LAB — BASIC METABOLIC PANEL
CO2: 29
Glucose, Bld: 92
Potassium: 3.6
Sodium: 138

## 2011-08-23 LAB — BASIC METABOLIC PANEL
CO2: 29
Calcium: 9.2
Chloride: 105
GFR calc Af Amer: >60
Sodium: 138

## 2011-08-23 LAB — APTT: aPTT: 46 — ABNORMAL HIGH

## 2011-08-23 LAB — PROTIME-INR: INR: 3.2 — ABNORMAL HIGH

## 2011-10-01 ENCOUNTER — Other Ambulatory Visit: Payer: Self-pay | Admitting: Internal Medicine

## 2011-10-02 ENCOUNTER — Other Ambulatory Visit: Payer: Self-pay

## 2011-10-02 MED ORDER — FUROSEMIDE 40 MG PO TABS: 40.0000 mg | ORAL_TABLET | Freq: Every day | ORAL | Status: DC

## 2011-10-05 ENCOUNTER — Telehealth: Payer: Self-pay | Admitting: Pulmonary Disease

## 2011-10-05 NOTE — Telephone Encounter (Signed)
Called and spoke with pt's daughter.  Daughter states she wonders if pt may have thrush on her lips.  States she was seen by dentist recently and he recommended Peroxyl mouthwash which she is doing but isn't helping.   Denies any sores or white patches in mouth.  States it's only on her lips.  States lips are bright red and sore, esp in the corners.  States her lips have "lost their shape."  But denies swelling or an allergic reaction.  States this has been going on for approx 2 weeks.  Wants pt to be seen.  Pt scheduled to see TP on Monday 11/26 at 4pm

## 2011-10-08 ENCOUNTER — Ambulatory Visit (INDEPENDENT_AMBULATORY_CARE_PROVIDER_SITE_OTHER): Payer: Medicare Other | Admitting: Adult Health

## 2011-10-08 ENCOUNTER — Other Ambulatory Visit (INDEPENDENT_AMBULATORY_CARE_PROVIDER_SITE_OTHER): Payer: Medicare Other

## 2011-10-08 ENCOUNTER — Encounter: Payer: Self-pay | Admitting: Adult Health

## 2011-10-08 DIAGNOSIS — D649 Anemia, unspecified: Secondary | ICD-10-CM

## 2011-10-08 DIAGNOSIS — K130 Diseases of lips: Secondary | ICD-10-CM

## 2011-10-08 DIAGNOSIS — D510 Vitamin B12 deficiency anemia due to intrinsic factor deficiency: Secondary | ICD-10-CM

## 2011-10-08 LAB — CBC WITH DIFFERENTIAL/PLATELET
Basophils Absolute: 0 10*3/uL (ref 0.0–0.1)
Hemoglobin: 11.7 g/dL — ABNORMAL LOW (ref 12.0–15.0)
Lymphocytes Relative: 29 % (ref 12.0–46.0)
Monocytes Relative: 10.4 % (ref 3.0–12.0)
Neutro Abs: 2.8 10*3/uL (ref 1.4–7.7)
RBC: 3.21 Mil/uL — ABNORMAL LOW (ref 3.87–5.11)
RDW: 13.8 % (ref 11.5–14.6)

## 2011-10-08 LAB — IBC PANEL: Saturation Ratios: 26 % (ref 20.0–50.0)

## 2011-10-08 NOTE — Patient Instructions (Signed)
Carmex chapstick As needed   I will call with labs  Please contact office for sooner follow up if symptoms do not improve or worsen or seek emergency care   follow up Dr. Kriste Basque  In  2 months and As needed

## 2011-10-09 LAB — VITAMIN B12: Vitamin B-12: 460 pg/mL (ref 211–911)

## 2011-10-09 NOTE — Assessment & Plan Note (Signed)
Suspected Angular Cheilitis - advised to use vaseline /chapstick Twice daily   And As needed   If not improving can consider topical clotrimazole to corner of mouth  Will check labs for anemia/ -iron /b12 levels Please contact office for sooner follow up if symptoms do not improve or worsen or seek emergency care

## 2011-10-09 NOTE — Progress Notes (Signed)
Subjective:    Patient ID: [name], female    DOB: 1919-08-04, 77 y.o.   MRN: [id number]  HPI 77 y/o WF here for a follow up visit... she has multiple medical problems including:  Hx Asthmatic bronchitis/ Pneumonia;  CAD & ASD repair;  AFib & Pacemaker;  Carotid Art dis;  Ven Insuffic;  Hyperchol;  Divertics;  s/p bilat inguinal hernia repairs 8/11;  Overactive bladder;  DJD/ LBP/ Osteoporosis;  Anemia & bilat rectus muscle hematomas 1/12...   ~  December 14, 2010:  [name] was hosp 1/21 - 12/10/10 by [facility] w/ RLL pneumonia- NOS & resolved w/ antibiotic Rx; Labs showed Anemia w/ Hg 11.0==>8.1, MCV~99 & Fe<10, Ferritin=167, B12=581, Folate=11, Protimes w/ INR= 2.1-2.9, & CTAbd w/ bilat rectus musc hematomas (?spontaneous hemorrhage);  Note: 3/11 Hg=12.0, & 8/11 Hg=11.8 (MCV=106);  Stool heme neg here today; last colon check 1995 & neg x divertics;  She was disch off Coumadin, on FeSO4, not on PPI, & ret today for f/u labs> feeling weak, tired, congested, sl nausea, no vomiting, vague abd pains, & bruising; Note: Hospitalist & Radiologist didn't feel the rectus hematomas were large enough to account for the anemia (& acute bleed wouldn't lead to Fe<10); she denies any prev hint of bleeding- no melena, BRB, etc.    We plan f/u CXR (approaching baseline w/ min resid RLL markings & blunt angle); & LABS/ Anemia eval (Hg improved to 10.5, MCV=109, Fe=78 & 21%sat, PT INR=1.1, BNP=324, stool heme neg, SPE=neg).  ~  February 09, 2011:  2 month ROV & stable but she has persist GI symptoms- vague discomfort, swelling, constip "there's something there" she says> offered GI referral (she wants to hold off) vs incr laxatives w/ Miralax & Senakot-S to establish soft easy to pass BMs;  We added Omep 20mg /d last OV but she stopped it on her own & doesn't want to take it;  She saw DrKlein 2/12 for f/u AFib/ pacer, he reviewed her Echo from 1/12 & decided to leave the Coumadin off for now w/ rov in 49mo...    Resp>  She has  Mucinex for Prn use but not taking any regular breathing meds...    Cards>  She has CAD, Hx ASD repair, AFib, Pacer, Carotid dis, pulmHTN;  Coumadin on HOLD & taking Lasix40mg /d w/ K10Bid;  2DEcho 1/12 showed EF 65-70%, mild LVH, restricted AV motion with mean gradient of 15 mmHg, mod MR, mod-severe LAE and mod RAE, mod TR, mod PR, PASP 60...    Chol>  On Fish Oil supplements & last FLP 3/11 looked pretty good;  Her wt is stable at 92 lbs...     Ortho>  This has been her biggest complaint- DJD, LBP, known sp stenosis, L3 compression, osteoporosis;  She remains on Alendronate, Calcium, MVI, Vit D, & uses Tramadol & Tylenol for pain...  ~  Apr 11, 2011:  73mo ROV & she persists w/ mult somatic complaints- eg. Heel feels funny, fingertips numb, toenails turns yellow then back to normal, etc... She has persistant abd discomfort "a pressure" across her abd & assoc nausea- she wonders if related to her double hernia surg w/ mesh last yr? Recall hosp 1/12 w/ pneumonia & she developed anemia w/ Hg down to 8.1 & CT showed bilat rectus sheath hematoms R>L (? If this was enough to explain the anemia);  We discussed the NEED for further eval by GI at this point (as she has refused further eval previously but the symptoms  are persistant) & we will refer to GI...  ~  July 25, 2011:  3-27mo ROV & she was eval by PGuenter/ DrKaplan in GI> for 2 problems: her persistent abd symptoms (bloating, gas, crampy pain, constip, nausea, etc); and her anemia w/ Fe<10 during the prev hosp; they felt that the abd pain was musculoskeletal in nature & rec Celebrex trial + Prilosec, and she really doesn't feel that this helped much she says; stools were neg for blood; Hg improved to 12.2; no further work up recommended...  Today she notes some lower abd cramping pain off & on assoc w/ some gas, nausea, etc> we discussed trying BENTYL 20mg  Tid to see if this helps...    She saw DrKlein 6/12 for f/u AFib, heart block w/ pacer> he decided  to leave her off the coumadin & pacer check was fine...    She saw DrRamos 7/12 for f/u of her severe spinal stenosis at L4-5 w/ selective nerve root blocks attempted w/ some temporary benefit...    She is also c/o persistant wrist & hand symptoms w/ paresthesias & pain suggestive of CTS> we rec wrist splints in the past but says the discomfort is no better, therefore we will need to refer to hand surg (DrGramig) for NCV & consideration of surg...  10/08/2011 Acute OV  Complains of redness, soreness on lips esp in the corners, some swelling x3weeks.  reports regular chapstick and peroxil mouthwash has not helped. No new meds or skin products. No sore throat or dysphagia. No abdominal pain or overt reflux. No dyspnea or wheezing or tongue swelling.          Problem List:   RESPIRATORY DISORDER, CHRONIC (ICD-519.9) - on MUCINEX 600mg  1-2 Bid... she is a never smoker w/ hx obstructive asthmatic bronchitis over the years treated by DrESL... baseline CXR w/ overinflated lungs, decr markings, enlarged heart, NAD.... ~  CXR 9/11 in hosp showed cardiomeg, NAD. ~  Adm 1/12 w/ RLL pneumonia- NOS, improved w/ antibiotics back to baseline.  CORONARY ARTERY DISEASE (ICD-414.00) Hx of ATRIAL SEPTAL DEFECT (ICD-745.5) - she has known non-obstructive CAD and is followed by DrCooper... she had an ASD repair at [facility] in [facility]... she was on COUMADIN until 1/12 hosp w/ pneumonia> had therapeutic protimes w/ INR= 2.1-2.9 but developed anemia (Hg=8.1), bilat rectus musc hematomas, Fe<10, etc... taken off Coumadin at that point. ~  last cath 1996 showed 40% Lmain, 30% proxLAD, 30%procRCA, normCIRC... ~  negative persantine cardiolite 10/97 with EF= 82%... ~  9/11:  she denies CP, palpit, syncope, change in SOB or edema...  ATRIAL FIBRILLATION (ICD-427.31) PACEMAKER, PERMANENT (ICD-V45.01) - she is followed by DrKlein prev on Coumadin as noted above... s/p AV junction ablation attempts x2 by DrKlein- now considered  permanent AFib & pacer placed 3/05 for SSS, generator changed 12/09. ~  2DEcho 5/06 showed mild dil LA,  mild AI & AoV sclerosis, mod MR, EF= 65%... ~  EKG 1/11 showed Pacer rhythm... ~  seen by DrKlein 8/11- stable, pacer OK, no changes made. ~  2DEcho 1/12 showed mild LVH, norm wall motion & EF=65-70%, AoV thickened/ calcif/ restricted w/ mean gradient=15, RA & LA dilated, PAsys=13mmHg.  CAROTID BRUIT (ICD-785.9) - she had CDopplers 1996 showing mild irreg plaque bilat, no signif stenosis... she has a bruit on exam-  ~  CDopplers 9/09 showed mild irreg heterogeneous plaque, 0-39% bilat ICA stenoses... ~  CDoppler 9/10 showed similar findings w/ 0-39% RICA stenosis & 40-59% LICA stenosis... f/u 70yr. ~  CDoppler 9/11 showed same findings- repeat 79yr.  VENOUS INSUFFICIENCY (ICD-459.81) - she follows a low sodium diet and takes LASIX 40mg /d + MicroK-10 Bid...  HYPERCHOLESTEROLEMIA (ICD-272.0) - now off Vytorin due to muscle symptoms and doing much better... on Omega-3 Fish Oil-  ~  FLP 5/08 on Vytorin10-20 showed TChol 91, TG 47, HDL 41, LDL 41 ~  FLP 10/08 on Vytorin10-20 (1/2 tab daily) showed TChol 139, TG 58, HDL 58, LDL 70 ~  FLP 9/09 off Vytorin x7mo showed TChol 158, TG 38, HDL 46, LDL 104 ~  FLP 3/10 showed TChol 197, TG 51, HDL 57, LDL 129 ~  FLP 3/11 showed TChol 192, TG 86, HDL 70, LDL 105  DIVERTICULOSIS OF COLON (ICD-562.10) - FlexSig 1995 by DrPatterson showed divertics only...  INGUINAL HERNIAs >>> s/p bilat open inguinal hernia repairs w/ mesh by Jean Rosenthal 8/11...  OVERACTIVE BLADDER (ICD-596.51) - she's on DETROL 2mg Bid per Hulda Humphrey...  DEGENERATIVE JOINT DISEASE (ICD-715.90) BACK PAIN, LUMBAR (ICD-724.2) - she has mod LBP and known spinal stenosis... she saw DrSypher in 2008 for osteoarthritis left hand w/ cyst on thumb (surg 10/08)... on TRAMADOL per DrRamos. ~  she has severe sp stenosis L4-5, & mod sp stenosis L3-4 >> CT scans of sp & hips showed severe scoliosis w/  advanced DDD & canal & foraminal stenosis, osteophytes, osteopenia, calcif vessels, divertics, sm left inguinal hernia. ~  3/11:  we tried Duragesic-25 patches for her pain but it made her nauseous, dizzy & loopy so she stopped. ~  6/11:  she tells me that DrRamos has referred her to Neuro- DrYan> no neuropathy & no radiculopathy by report. ~  7/12:  DrRamos tried selective nerve root block L4-5 right; she reports min benefit...  OSTEOPOROSIS (ICD-733.00) - she takes ALENDRONATE 70mg /wk, Calcium, Vits, etc... she has L3 compression. ~  BMD here 8/08 showed TScores +0.4 in Spine, and -3.4 in left FemNeck... ~  BMD here 9/10 showed TScores +0.9 in Spine, and -3.5 in [facility]  ANEMIA (ICD-285.9)  << SEE ABOVE >> ~  Hg nadir 1/12 hosp was 8.1 & Fe was <10 ~  Labs 3/12 showed Hg= 12.0, Fe= 82 (22%sat) ~  Labs 6/12 showed Hg= 12.2, MCV= 107, stool neg for occult blood.   Past Medical History  Diagnosis Date  . Pneumonia, organism unspecified   . Unspecified disease of respiratory system   . CAD (coronary artery disease)   . Ostium secundum type atrial septal defect   . AV block   . Presence of permanent cardiac pacemaker   . Venous insufficiency   . Pure hypercholesterolemia   . Diverticulosis of colon (without mention of hemorrhage)   . Overactive bladder   . DJD (degenerative joint disease)   . LBP (low back pain)   . Chronic pain syndrome   . Osteoporosis   . Anemia     Past Surgical History  Procedure Date  . Vesicovaginal fistula closure w/ tah 1973  . Cataract extraction   . Asd repair   . Pacemaker placement 1996    St Jude  . Inguinal hernia repair 2011    bilaterally  . Shoulder surgery     x 2  . Ganglion cyst excision   . Partial hysterectomy     Outpatient Encounter Prescriptions as of 10/08/2011  Medication Sig Dispense Refill  . acetaminophen (TYLENOL) 500 MG tablet Take 650 mg by mouth every 8 (eight) hours as needed. Take with tramadol as needed for pain       .  alendronate (FOSAMAX) 70 MG tablet Take 70 mg by mouth every 7 (seven) days. Take with a full glass of water on an empty stomach.       . Ascorbic Acid (VITAMIN C) 1000 MG tablet Take 1,000 mg by mouth daily.        . Biotin 1000 MCG tablet Take 1,000 mcg by mouth daily.        . Calcium Carbonate-Vitamin D (CALCIUM 600+D) 600-400 MG-UNIT per tablet Take 2 tablets by mouth daily.        . Cholecalciferol (VITAMIN D) 1000 UNITS capsule Take 1,000 Units by mouth daily.        Marland Kitchen dicyclomine (BENTYL) 20 MG tablet Take 1 tablet (20 mg total) by mouth 3 (three) times daily as needed (abdominal cramping).  90 tablet  1  . ferrous sulfate 325 (65 FE) MG tablet Take 325 mg by mouth daily with breakfast.       . furosemide (LASIX) 40 MG tablet Take 1 tablet (40 mg total) by mouth daily. Or as directed  90 tablet  1  . Glucosamine 500 MG CAPS Take 1 capsule by mouth daily.        . Omega-3 350 MG CAPS Take 1 capsule by mouth daily.        Marland Kitchen omeprazole (PRILOSEC) 20 MG capsule Take 20 mg by mouth daily.        . potassium chloride (KLOR-CON) 10 MEQ CR tablet Take 10 mEq by mouth 2 (two) times daily.        Marland Kitchen tolterodine (DETROL LA) 2 MG 24 hr capsule Take 2 mg by mouth 2 (two) times daily.        . traMADol (ULTRAM) 50 MG tablet Take 50 mg by mouth every 6 (six) hours as needed.        . vitamin A 8000 UNIT capsule Take 8,000 Units by mouth every other day.        . vitamin E 400 UNIT capsule Take 400 Units by mouth daily.        Marland Kitchen DISCONTD: celecoxib (CELEBREX) 100 MG capsule Take 1 capsule (100 mg total) by mouth daily.  20 capsule  0    Allergies  Allergen Reactions  . Amiodarone Hcl     REACTION: INTOL to Amiodarone in past  . Codeine   . Fentanyl     REACTION: causes nausea--dizziness    Review of Systems        Constitutional:   No  weight loss, night sweats,  Fevers, chills ++, fatigue, or  lassitude.  HEENT:   No headaches,  Difficulty swallowing,  Tooth/dental problems, or  Sore  throat,                No sneezing, itching, ear ache, nasal congestion, post nasal drip,   CV:  No chest pain,  Orthopnea, PND,   anasarca, dizziness, palpitations, syncope.   GI  No heartburn, indigestion, abdominal pain, nausea, vomiting, diarrhea, change in bowel habits, loss of appetite, bloody stools.   Resp: No shortness of breath with exertion or at rest.  No excess mucus, no productive cough,  No non-productive cough,  No coughing up of blood.  No change in color of mucus.  No wheezing.  No chest wall deformity  Skin: no rash or lesions.  GU: no dysuria, change in color of urine, no urgency or frequency.  No flank pain, no hematuria   MS:  No joint pain or swelling.  No  decreased range of motion.     Psych:  No change in mood or affect. No depression or anxiety.           Objective:   Physical Exam     WD, Thin, 77 y/o WF in NAD  GENERAL:  Alert & oriented; pleasant & cooperative... HEENT:  [place]/AT, EOM-full, PERRLA, EACs-clear, TMs-wnl, NOSE-clear, THROAT-clear & wnl. No lesions or exudate noted. Slight redness/irritations along upper/lower lips with cracks in corner of lips  NECK:  Supple w/ fairROM; no JVD; normal carotid impulses w/ 2+ bruit; no thyromegaly or nodules palpated; no lymphadenopathy. CHEST:  Decr BS bilat, clear to P & A; without wheezes/ rales/ or rhonchi heard... HEART:  Regular Rhythm- pacer; gr 1/6 SEM without rub bilat inguinal hernia scars; normal bowel sounds; no organomegaly or mases palpated. EXT: without deformities, mod arthritic changes; no varicose veins/ +venous insuffic/ tr edema. NEURO:   ; no focal neuro deficits... DERM:  No lesions noted; no rash etc...   Assessment & Plan:

## 2011-10-11 ENCOUNTER — Telehealth: Payer: Self-pay | Admitting: Adult Health

## 2011-10-11 NOTE — Telephone Encounter (Signed)
Pt informed of lab results per Tammy Parrett 

## 2011-10-23 ENCOUNTER — Encounter: Payer: Self-pay | Admitting: Internal Medicine

## 2011-10-23 ENCOUNTER — Ambulatory Visit (INDEPENDENT_AMBULATORY_CARE_PROVIDER_SITE_OTHER): Payer: Medicare Other | Admitting: Internal Medicine

## 2011-10-23 DIAGNOSIS — I639 Cerebral infarction, unspecified: Secondary | ICD-10-CM | POA: Insufficient documentation

## 2011-10-23 DIAGNOSIS — I4891 Unspecified atrial fibrillation: Secondary | ICD-10-CM

## 2011-10-23 DIAGNOSIS — I6350 Cerebral infarction due to unspecified occlusion or stenosis of unspecified cerebral artery: Secondary | ICD-10-CM

## 2011-10-23 DIAGNOSIS — Z950 Presence of cardiac pacemaker: Secondary | ICD-10-CM

## 2011-10-23 DIAGNOSIS — I442 Atrioventricular block, complete: Secondary | ICD-10-CM

## 2011-10-23 LAB — PACEMAKER DEVICE OBSERVATION
BRDY-0002RV: 70 {beats}/min
BRDY-0004RV: 110 {beats}/min
RV LEAD AMPLITUDE: 7.6 mv
RV LEAD THRESHOLD: 1.375 V

## 2011-10-23 NOTE — Assessment & Plan Note (Signed)
Permanent as above

## 2011-10-23 NOTE — Progress Notes (Signed)
HPI  [name] is a 77 y.o. female seen in followup for permanent atrial fibrillation. She is status post AV junction on 2 separate occasions and has a previously implanted pacemaker.   She has had no specific cardiac problems however, she has noted drooling from her right lip as well as some dizziness. Reviewing her chart, anticoagulation was discontinued in January because of rectal sheath hematoma that occurred in the context of hospitalization for pneumonia.   The patient denies chest pain, shortness of breath, nocturnal dyspnea, orthopnea or peripheral edema. There have been no palpitations, lightheadedness or syncope. Major problems remains disability 2/2 back  Echo done 12/03/10 demonstrated EF 65-70%; mild LVH; restricted AV motion with mean gradient of 15 mmHg; mod MR; mod-severe LAE and mod RAE; mod TR; mod PR; PASP 60.  Na 136 following SIADH in the hospital K 3.6, Creat 0.60 (12/10/10)   Past Medical History  Diagnosis Date  . Pneumonia, organism unspecified   . Unspecified disease of respiratory system   . CAD (coronary artery disease)   . Ostium secundum type atrial septal defect   . AV block   . Presence of permanent cardiac pacemaker   . Venous insufficiency   . Pure hypercholesterolemia   . Diverticulosis of colon (without mention of hemorrhage)   . Overactive bladder   . DJD (degenerative joint disease)   . LBP (low back pain)   . Chronic pain syndrome   . Osteoporosis   . Anemia     Past Surgical History  Procedure Date  . Vesicovaginal fistula closure w/ tah 1973  . Cataract extraction   . Asd repair   . Pacemaker placement 1996    St Jude  . Inguinal hernia repair 2011    bilaterally  . Shoulder surgery     x 2  . Ganglion cyst excision   . Partial hysterectomy     Current Outpatient Prescriptions  Medication Sig Dispense Refill  . acetaminophen (TYLENOL) 500 MG tablet Take 650 mg by mouth every 8 (eight) hours as needed. Take with tramadol  as needed for pain      . alendronate (FOSAMAX) 70 MG tablet Take 70 mg by mouth every 7 (seven) days. Take with a full glass of water on an empty stomach.       . Ascorbic Acid (VITAMIN C) 1000 MG tablet Take 1,000 mg by mouth daily.        . Biotin 1000 MCG tablet Take 1,000 mcg by mouth daily.        . Calcium Carbonate-Vitamin D (CALCIUM 600+D) 600-400 MG-UNIT per tablet Take 2 tablets by mouth daily.        . Cholecalciferol (VITAMIN D) 1000 UNITS capsule Take 1,000 Units by mouth daily.        . ferrous sulfate 325 (65 FE) MG tablet Take 325 mg by mouth daily with breakfast.       . furosemide (LASIX) 40 MG tablet Take 1 tablet (40 mg total) by mouth daily. Or as directed  90 tablet  1  . Glucosamine 500 MG CAPS Take 1 capsule by mouth daily.        . Omega-3 350 MG CAPS Take 1 capsule by mouth daily.        Marland Kitchen omeprazole (PRILOSEC) 20 MG capsule Take 20 mg by mouth daily.        . potassium chloride (KLOR-CON) 10 MEQ CR tablet Take 10 mEq by mouth 2 (two) times daily.        Marland Kitchen  tolterodine (DETROL LA) 2 MG 24 hr capsule Take 2 mg by mouth 2 (two) times daily.        . traMADol (ULTRAM) 50 MG tablet Take 50 mg by mouth every 6 (six) hours as needed.        . vitamin A 8000 UNIT capsule Take 8,000 Units by mouth every other day.        . vitamin E 400 UNIT capsule Take 400 Units by mouth daily.          Allergies  Allergen Reactions  . Amiodarone Hcl     REACTION: INTOL to Amiodarone in past  . Codeine   . Fentanyl     REACTION: causes nausea--dizziness    Review of Systems negative except from HPI and PMH  Physical Exam Well developed and well nourished in no acute distress HENT normal E scleral and icterus clear Neck Supple JVP flat; carotids brisk and full Clear to ausculation Regular rate and rhythm, no murmurs gallops or rub Soft with active bowel sounds No clubbing cyanosis none Edema Alert and oriented, grossly normal motor and sensory function;There is a little bit  of a right lip droop Skin Warm and Dry   Assessment and  Plan

## 2011-10-23 NOTE — Patient Instructions (Signed)
Your physician has recommended you make the following change in your medication:  1) Resume coumadin 2.5mg  once daily.  INR goal is 1.8- 2.2.  We will need to have you come in on Monday 12/17 for a coumadin check.  Your physician wants you to follow-up in: 1 year with Dr. Graciela Husbands. You will receive a reminder letter in the mail two months in advance. If you don't receive a letter, please call our office to schedule the follow-up appointment.

## 2011-10-23 NOTE — Assessment & Plan Note (Signed)
The patient's device was interrogated.  The information was reviewed. No changes were made in the programming.    

## 2011-10-23 NOTE — Assessment & Plan Note (Signed)
Patient appears to have had a small stroke with manifestations of dizziness and a right upper lip. We will resume Coumadin. We will shoot for target INR of 1.8-2.2. Will start on the low end at 2.5 mg daily. There is clearly a risk. However, given the intercurrent stroke or risk of recurrent stroke is very high. The family is aware of the balance of the risk and benefit

## 2011-10-23 NOTE — Assessment & Plan Note (Signed)
100% ventricular pacing

## 2011-10-29 ENCOUNTER — Ambulatory Visit (INDEPENDENT_AMBULATORY_CARE_PROVIDER_SITE_OTHER): Payer: Medicare Other | Admitting: *Deleted

## 2011-10-29 DIAGNOSIS — I4891 Unspecified atrial fibrillation: Secondary | ICD-10-CM

## 2011-10-29 DIAGNOSIS — Z7901 Long term (current) use of anticoagulants: Secondary | ICD-10-CM

## 2011-10-29 LAB — POCT INR: INR: 1.1

## 2011-10-29 NOTE — Patient Instructions (Signed)

## 2011-11-05 ENCOUNTER — Encounter: Payer: Self-pay | Admitting: Internal Medicine

## 2011-11-07 ENCOUNTER — Ambulatory Visit (INDEPENDENT_AMBULATORY_CARE_PROVIDER_SITE_OTHER): Payer: Medicare Other | Admitting: *Deleted

## 2011-11-07 DIAGNOSIS — I4891 Unspecified atrial fibrillation: Secondary | ICD-10-CM

## 2011-11-07 DIAGNOSIS — Z7901 Long term (current) use of anticoagulants: Secondary | ICD-10-CM

## 2011-11-07 LAB — POCT INR: INR: 2.6

## 2011-11-08 ENCOUNTER — Telehealth: Payer: Self-pay | Admitting: Internal Medicine

## 2011-11-08 MED ORDER — POTASSIUM CHLORIDE 10 MEQ PO TBCR: 10.0000 meq | EXTENDED_RELEASE_TABLET | Freq: Two times a day (BID) | ORAL | Status: DC

## 2011-11-08 NOTE — Telephone Encounter (Signed)
New Msg: Pt daughter calling stating that pt needs potassium chloride 10 mEq called into [facility]. Pt is out of refills.

## 2011-11-14 ENCOUNTER — Ambulatory Visit (INDEPENDENT_AMBULATORY_CARE_PROVIDER_SITE_OTHER): Payer: Medicare Other | Admitting: *Deleted

## 2011-11-14 DIAGNOSIS — Z7901 Long term (current) use of anticoagulants: Secondary | ICD-10-CM

## 2011-11-14 DIAGNOSIS — I4891 Unspecified atrial fibrillation: Secondary | ICD-10-CM

## 2011-11-14 LAB — POCT INR: INR: 2

## 2011-11-26 ENCOUNTER — Ambulatory Visit (INDEPENDENT_AMBULATORY_CARE_PROVIDER_SITE_OTHER): Payer: Medicare Other | Admitting: *Deleted

## 2011-11-26 DIAGNOSIS — I4891 Unspecified atrial fibrillation: Secondary | ICD-10-CM | POA: Diagnosis not present

## 2011-11-26 DIAGNOSIS — Z7901 Long term (current) use of anticoagulants: Secondary | ICD-10-CM

## 2011-11-26 LAB — POCT INR: INR: 1.6

## 2011-12-12 ENCOUNTER — Other Ambulatory Visit (INDEPENDENT_AMBULATORY_CARE_PROVIDER_SITE_OTHER): Payer: Medicare Other

## 2011-12-12 ENCOUNTER — Encounter: Payer: Self-pay | Admitting: Pulmonary Disease

## 2011-12-12 ENCOUNTER — Ambulatory Visit (INDEPENDENT_AMBULATORY_CARE_PROVIDER_SITE_OTHER): Payer: Medicare Other | Admitting: *Deleted

## 2011-12-12 ENCOUNTER — Ambulatory Visit (INDEPENDENT_AMBULATORY_CARE_PROVIDER_SITE_OTHER): Payer: Medicare Other | Admitting: Pulmonary Disease

## 2011-12-12 ENCOUNTER — Ambulatory Visit: Payer: Medicare Other | Admitting: Pulmonary Disease

## 2011-12-12 DIAGNOSIS — I442 Atrioventricular block, complete: Secondary | ICD-10-CM

## 2011-12-12 DIAGNOSIS — E7800 Pure hypercholesterolemia, unspecified: Secondary | ICD-10-CM

## 2011-12-12 DIAGNOSIS — Z7901 Long term (current) use of anticoagulants: Secondary | ICD-10-CM | POA: Diagnosis not present

## 2011-12-12 DIAGNOSIS — I5032 Chronic diastolic (congestive) heart failure: Secondary | ICD-10-CM

## 2011-12-12 DIAGNOSIS — R0989 Other specified symptoms and signs involving the circulatory and respiratory systems: Secondary | ICD-10-CM

## 2011-12-12 DIAGNOSIS — N318 Other neuromuscular dysfunction of bladder: Secondary | ICD-10-CM

## 2011-12-12 DIAGNOSIS — I2510 Atherosclerotic heart disease of native coronary artery without angina pectoris: Secondary | ICD-10-CM

## 2011-12-12 DIAGNOSIS — M545 Low back pain, unspecified: Secondary | ICD-10-CM

## 2011-12-12 DIAGNOSIS — M810 Age-related osteoporosis without current pathological fracture: Secondary | ICD-10-CM | POA: Diagnosis not present

## 2011-12-12 DIAGNOSIS — I4891 Unspecified atrial fibrillation: Secondary | ICD-10-CM

## 2011-12-12 DIAGNOSIS — K5730 Diverticulosis of large intestine without perforation or abscess without bleeding: Secondary | ICD-10-CM

## 2011-12-12 DIAGNOSIS — Z950 Presence of cardiac pacemaker: Secondary | ICD-10-CM | POA: Diagnosis not present

## 2011-12-12 DIAGNOSIS — I6529 Occlusion and stenosis of unspecified carotid artery: Secondary | ICD-10-CM

## 2011-12-12 DIAGNOSIS — M1990 Unspecified osteoarthritis, unspecified site: Secondary | ICD-10-CM

## 2011-12-12 DIAGNOSIS — I509 Heart failure, unspecified: Secondary | ICD-10-CM

## 2011-12-12 DIAGNOSIS — D649 Anemia, unspecified: Secondary | ICD-10-CM

## 2011-12-12 LAB — HEPATIC FUNCTION PANEL
ALT: 17 U/L (ref 0–35)
AST: 51 U/L — ABNORMAL HIGH (ref 0–37)
Albumin: 4.4 g/dL (ref 3.5–5.2)
Alkaline Phosphatase: 35 U/L — ABNORMAL LOW (ref 39–117)

## 2011-12-12 LAB — BASIC METABOLIC PANEL
BUN: 15 mg/dL (ref 6–23)
Chloride: 99 mEq/L (ref 96–112)
Glucose, Bld: 92 mg/dL (ref 70–99)
Potassium: 4.8 mEq/L (ref 3.5–5.1)

## 2011-12-12 LAB — TSH: TSH: 0.48 u[IU]/mL (ref 0.35–5.50)

## 2011-12-12 LAB — LIPID PANEL: Cholesterol: 187 mg/dL (ref 0–200)

## 2011-12-12 NOTE — Progress Notes (Signed)
Subjective:    Patient ID: [name], female    DOB: 1919-08-04, 78 y.o.   MRN: [id number]  HPI 78 y/o WF here for a follow up visit... she has multiple medical problems including:  Hx Asthmatic bronchitis/ Pneumonia;  CAD & ASD repair;  AFib & Pacemaker;  Carotid Art dis;  Ven Insuffic;  Hyperchol;  Divertics;  s/p bilat inguinal hernia repairs 8/11;  Overactive bladder;  DJD/ LBP/ Osteoporosis;  Anemia & bilat rectus muscle hematomas 1/12...   ~  December 14, 2010:  [name] was hosp 1/21 - 12/10/10 by [facility] w/ RLL pneumonia- NOS & resolved w/ antibiotic Rx; Labs showed Anemia w/ Hg 11.0==>8.1, MCV~99 & Fe<10, Ferritin=167, B12=581, Folate=11, Protimes w/ INR= 2.1-2.9, & CTAbd w/ bilat rectus musc hematomas (?spontaneous hemorrhage);  Note: 3/11 Hg=12.0, & 8/11 Hg=11.8 (MCV=106);  Stool heme neg here today; last colon check 1995 & neg x divertics;  She was disch off Coumadin, on FeSO4, not on PPI, & ret today for f/u labs> feeling weak, tired, congested, sl nausea, no vomiting, vague abd pains, & bruising; Note: Hospitalist & Radiologist didn't feel the rectus hematomas were large enough to account for the anemia (& acute bleed wouldn't lead to Fe<10); she denies any prev hint of bleeding- no melena, BRB, etc.    We plan f/u CXR (approaching baseline w/ min resid RLL markings & blunt angle); & LABS/ Anemia eval (Hg improved to 10.5, MCV=109, Fe=78 & 21%sat, PT INR=1.1, BNP=324, stool heme neg, SPE=neg).  ~  February 09, 2011:  2 month ROV & stable but she has persist GI symptoms- vague discomfort, swelling, constip "there's something there" she says> offered GI referral (she wants to hold off) vs incr laxatives w/ Miralax & Senakot-S to establish soft easy to pass BMs;  We added Omep 20mg /d last OV but she stopped it on her own & doesn't want to take it;  She saw DrKlein 2/12 for f/u AFib/ pacer, he reviewed her Echo from 1/12 & decided to leave the Coumadin off for now w/ rov in 49mo...    Resp>  She has  Mucinex for Prn use but not taking any regular breathing meds...    Cards>  She has CAD, Hx ASD repair, AFib, Pacer, Carotid dis, pulmHTN;  Coumadin on HOLD & taking Lasix40mg /d w/ K10Bid;  2DEcho 1/12 showed EF 65-70%, mild LVH, restricted AV motion with mean gradient of 15 mmHg, mod MR, mod-severe LAE and mod RAE, mod TR, mod PR, PASP 60...    Chol>  On Fish Oil supplements & last FLP 3/11 looked pretty good;  Her wt is stable at 92 lbs...     Ortho>  This has been her biggest complaint- DJD, LBP, known sp stenosis, L3 compression, osteoporosis;  She remains on Alendronate, Calcium, MVI, Vit D, & uses Tramadol & Tylenol for pain...  ~  Apr 11, 2011:  73mo ROV & she persists w/ mult somatic complaints- eg. Heel feels funny, fingertips numb, toenails turns yellow then back to normal, etc... She has persistant abd discomfort "a pressure" across her abd & assoc nausea- she wonders if related to her double hernia surg w/ mesh last yr? Recall hosp 1/12 w/ pneumonia & she developed anemia w/ Hg down to 8.1 & CT showed bilat rectus sheath hematoms R>L (? If this was enough to explain the anemia);  We discussed the NEED for further eval by GI at this point (as she has refused further eval previously but the symptoms  are persistant) & we will refer to GI...  ~  July 25, 2011:  3-77mo ROV & she was eval by PGuenter/ DrKaplan in GI> for 2 problems: her persistent abd symptoms (bloating, gas, crampy pain, constip, nausea, etc); and her anemia w/ Fe<10 during the prev hosp; they felt that the abd pain was musculoskeletal in nature & rec Celebrex trial + Prilosec, and she really doesn't feel that this helped much she says; stools were neg for blood; Hg improved to 12.2; no further work up recommended...  Today she notes some lower abd cramping pain off & on assoc w/ some gas, nausea, etc> we discussed trying BENTYL 20mg  Tid to see if this helps...    She saw DrKlein 6/12 for f/u AFib, heart block w/ pacer> he decided  to leave her off the coumadin & pacer check was fine...    She saw DrRamos 7/12 for f/u of her severe spinal stenosis at L4-5 w/ selective nerve root blocks attempted w/ some temporary benefit...    She is also c/o persistant wrist & hand symptoms w/ paresthesias & pain suggestive of CTS> we rec wrist splints in the past but says the discomfort is no better, therefore we will need to refer to hand surg (DrGramig) for NCV & consideration of surg...  ~  December 12, 2011:  4-5 month ROV & her CC remains back pain from her known sp stenosis treated by DrRamos et al; she has mult somatic complaints including cheilitis treated by TP w/ vaseline/ chapstick & improved; she is managing reasonably well w. Daughter's help at home...    She saw DrKlein 12/12 for f/u permanent AFib (s/p ablation x2 & pacer); he thought that she might have had a TIA & restarted her Coumadin (recall hx IDA w/ Hg=8 & Fe<10 w/ rectal shealth hematomas 2/12); he set INR target 1.8 to 2.2;  Pacer check was ok & noted 100% pacing rhythm...    She reports f/u eval by Hulda Humphrey & she is going to start back on Enablex for her voiding symptoms... Labs today showed FLP ok on diet alone; Chems- wnl on Lasix & KCl; TSH & VitD are ok... Recent CBC showed Hg= 11.7, MCV= 108, Fe= 79 (26%sat), B12= 460...          Problem List:   RESPIRATORY DISORDER, CHRONIC (ICD-519.9) - on MUCINEX 600mg  1-2 Bid... she is a never smoker w/ hx obstructive asthmatic bronchitis over the years treated by DrESL... baseline CXR w/ overinflated lungs, decr markings, enlarged heart, NAD.... ~  CXR 9/11 in hosp showed cardiomeg, NAD. ~  Adm 1/12 w/ RLL pneumonia- NOS, improved w/ antibiotics back to baseline.  CORONARY ARTERY DISEASE (ICD-414.00) Hx of ATRIAL SEPTAL DEFECT (ICD-745.5) - she has known non-obstructive CAD and is followed by DrCooper... she had an ASD repair [facility] in [facility]... she was on COUMADIN until 1/12 hosp w/ pneumonia> had therapeutic protimes w/  INR= 2.1-2.9 but developed anemia (Hg=8.1), bilat rectus musc hematomas, Fe<10, etc... taken off Coumadin at that point. ~  last cath 1996 showed 40% Lmain, 30% proxLAD, 30%procRCA, normCIRC... ~  negative persantine cardiolite 10/97 with EF= 82%... ~  9/11:  she denies CP, palpit, syncope, change in SOB or edema...  ATRIAL FIBRILLATION (ICD-427.31) PACEMAKER, PERMANENT (ICD-V45.01) - she is followed by DrKlein> s/p AV junction ablation attempts x2- now considered permanent AFib & pacer placed 3/05 for SSS, generator changed 12/09. ~  2DEcho 5/06 showed mild dil LA,  mild AI & AoV sclerosis,  mod MR, EF= 65%... ~  EKG 1/11 showed Pacer rhythm... ~  seen by DrKlein 8/11- stable, pacer OK, no changes made. ~  2DEcho 1/12 showed mild LVH, norm wall motion & EF=65-70%, AoV thickened/ calcif/ restricted w/ mean gradient=15, RA & LA dilated, PAsys=60mmHg. ~  1/12:  Coumadin stopped during [facility] for RLL pneumonia w/ anemia (Hg=8, Fe<10, bilat spontaneous rectus hematomas, other investig for source of bleeding/ low Fe was neg)... ~  12/12:  DrKlein restarted her Coumadin w/ INR goal 1.8-2.2 due to ?TIA & her permanent AFib that he follows...  CAROTID BRUIT (ICD-785.9) - she had CDopplers 1996 showing mild irreg plaque bilat, no signif stenosis... she has a bruit on exam-  ~  CDopplers 9/09 showed mild irreg heterogeneous plaque, 0-39% bilat ICA stenoses... ~  CDoppler 9/10 showed similar findings w/ 0-39% RICA stenosis & 40-59% LICA stenosis... f/u 66yr. ~  CDoppler 9/11 showed same findings- repeat 39yr.  VENOUS INSUFFICIENCY (ICD-459.81) - she follows a low sodium diet and takes LASIX 40mg /d + MicroK-10 Bid...  HYPERCHOLESTEROLEMIA (ICD-272.0) - now off Vytorin due to muscle symptoms and doing much better... on Omega-3 Fish Oil-  ~  FLP 5/08 on Vytorin10-20 showed TChol 91, TG 47, HDL 41, LDL 41 ~  FLP 10/08 on Vytorin10-20 (1/2 tab daily) showed TChol 139, TG 58, HDL 58, LDL 70 ~  FLP 9/09 off Vytorin  x19mo showed TChol 158, TG 38, HDL 46, LDL 104 ~  FLP 3/10 showed TChol 197, TG 51, HDL 57, LDL 129 ~  FLP 3/11 showed TChol 192, TG 86, HDL 70, LDL 105 ~  FLP 1/13 showed TChol 187, TG 80, HDL 67, LDL 104  DIVERTICULOSIS OF COLON (ICD-562.10) - FlexSig 1995 by DrPatterson showed divertics only...  INGUINAL HERNIAs >>> s/p bilat open inguinal hernia repairs w/ mesh by Jean Rosenthal 8/11...  OVERACTIVE BLADDER (ICD-596.51) - she's treated by Hulda Humphrey w/ Detrol==> Enablex...  DEGENERATIVE JOINT DISEASE (ICD-715.90) BACK PAIN, LUMBAR (ICD-724.2) - she has mod LBP and known spinal stenosis... she saw DrSypher in 2008 for osteoarthritis left hand w/ cyst on thumb (surg 10/08)... on TRAMADOL per DrRamos. ~  she has severe sp stenosis L4-5, & mod sp stenosis L3-4 >> CT scans of sp & hips showed severe scoliosis w/ advanced DDD & canal & foraminal stenosis, osteophytes, osteopenia, calcif vessels, divertics, sm left inguinal hernia. ~  3/11:  we tried Duragesic-25 patches for her pain but it made her nauseous, dizzy & loopy so she stopped. ~  6/11:  she tells me that DrRamos has referred her to Neuro- DrYan> no neuropathy & no radiculopathy by report. ~  7/12:  DrRamos tried selective nerve root block L4-5 right; she reports min benefit...  OSTEOPOROSIS (ICD-733.00) - she takes ALENDRONATE 70mg /wk, Calcium, Vits, etc... she has L3 compression. ~  BMD here 8/08 showed TScores +0.4 in Spine, and -3.4 in left FemNeck... ~  BMD here 9/10 showed TScores +0.9 in Spine, and -3.5 in [facility]  ANEMIA (ICD-285.9)  << SEE ABOVE >> ~  Hg nadir 1/12 hosp was 8.1 & Fe was <10 ~  Labs 3/12 showed Hg= 12.0, Fe= 82 (22%sat) ~  Labs 6/12 showed Hg= 12.2, MCV= 107, stool neg for occult blood. ~  Labs 11/12 showed Hg= 11.7, MCV= 108, Fe= 79 (26%sat), B12= 460   Past Medical History  Diagnosis Date  . Pneumonia, organism unspecified   . Respiratory decompensation   . CAD (coronary artery disease)   . Ostium  secundum type atrial  septal defect   . AV block   . Presence of permanent cardiac pacemaker   . Venous insufficiency   . Pure hypercholesterolemia   . Diverticulosis of colon (without mention of hemorrhage)   . Overactive bladder   . DJD (degenerative joint disease)   . LBP (low back pain)   . Chronic pain syndrome   . Osteoporosis   . Anemia     Past Surgical History  Procedure Date  . Vesicovaginal fistula closure w/ tah 1973  . Cataract extraction   . Asd repair   . Pacemaker placement 1996    [employer]  . Inguinal hernia repair 2011    bilaterally  . Shoulder surgery     x 2  . Ganglion cyst excision   . Partial hysterectomy     Outpatient Encounter Prescriptions as of 12/12/2011  Medication Sig Dispense Refill  . acetaminophen (TYLENOL) 500 MG tablet Take 650 mg by mouth every 8 (eight) hours as needed. Take with tramadol as needed for pain      . alendronate (FOSAMAX) 70 MG tablet Take 70 mg by mouth every 7 (seven) days. Take with a full glass of water on an empty stomach.       . Ascorbic Acid (VITAMIN C) 1000 MG tablet Take 1,000 mg by mouth daily.        . Biotin 1000 MCG tablet Take 1,000 mcg by mouth daily.        . Calcium Carbonate-Vitamin D (CALCIUM 600+D) 600-400 MG-UNIT per tablet Take 2 tablets by mouth daily.        . Cholecalciferol (VITAMIN D) 1000 UNITS capsule Take 1,000 Units by mouth daily.        . ferrous sulfate 325 (65 FE) MG tablet Take 325 mg by mouth daily with breakfast.       . furosemide (LASIX) 40 MG tablet Take 1 tablet (40 mg total) by mouth daily. Or as directed  90 tablet  1  . Glucosamine 500 MG CAPS Take 1 capsule by mouth daily.        . Omega-3 350 MG CAPS Take 1 capsule by mouth daily.        Marland Kitchen omeprazole (PRILOSEC) 20 MG capsule Take 20 mg by mouth daily.        . potassium chloride (KLOR-CON) 10 MEQ CR tablet Take 1 tablet (10 mEq total) by mouth 2 (two) times daily.  180 tablet  3  . tolterodine (DETROL LA) 2 MG 24 hr capsule  Take 2 mg by mouth 2 (two) times daily.        . traMADol (ULTRAM) 50 MG tablet Take 50 mg by mouth every 6 (six) hours as needed.        . vitamin A 8000 UNIT capsule Take 8,000 Units by mouth every other day.        . vitamin E 400 UNIT capsule Take 400 Units by mouth daily.        Marland Kitchen warfarin (COUMADIN) 2.5 MG tablet Take as directed        Allergies  Allergen Reactions  . Amiodarone Hcl     REACTION: INTOL to Amiodarone in past  . Codeine   . Fentanyl     REACTION: causes nausea--dizziness    Current Medications, Allergies, Past Medical History, Past Surgical History, Family History, and Social History were reviewed in [employer] record.    Review of Systems        See HPI -  all other systems neg except as noted... The patient complains of anorexia, decreased hearing, dyspnea on exertion, peripheral edema, prolonged cough, muscle weakness, and difficulty walking.  The patient denies fever, weight loss, weight gain, vision loss, hoarseness, chest pain, syncope, headaches, hemoptysis, abdominal pain, melena, hematochezia, severe indigestion/heartburn, hematuria, incontinence, suspicious skin lesions, transient blindness, depression, unusual weight change, abnormal bleeding, enlarged lymph nodes, and angioedema.    Objective:   Physical Exam     WD, Thin, 78 y/o WF in NAD... appears younger that stated age.  GENERAL:  Alert & oriented; pleasant & cooperative... HEENT:  [place]/AT, EOM-full, PERRLA, EACs-clear, TMs-wnl, NOSE-clear, THROAT-clear & wnl. NECK:  Supple w/ fairROM; no JVD; normal carotid impulses w/ 2+ bruit; no thyromegaly or nodules palpated; no lymphadenopathy. CHEST:  Decr BS bilat, clear to P & A; without wheezes/ rales/ or rhonchi heard... HEART:  Regular Rhythm- pacer; gr 1/6 SEM without rubs or gallops detected... ABDOMEN:  Soft & nontender; bilat inguinal hernia scars; normal bowel sounds; no organomegaly or mases palpated. EXT: without  deformities, mod arthritic changes; no varicose veins/ +venous insuffic/ tr edema. NEURO:  CN's intact; no focal neuro deficits... DERM:  No lesions noted; no rash etc...  RADIOLOGY DATA:  Reviewed in the EPIC EMR & discussed w/ the patient...  LABORATORY DATA:  Reviewed in the EPIC EMR & discussed w/ the patient...   Assessment & Plan:    PULM>  Back to baseline w/ underlying AB etc...  Cardiac>  CAD/ Hx ASD repair/ AFib/ Pacer>  She is followed by DrKlein & he restarted her Coumadin 12/12 w/ ?TIA? And her AFIB; INR goal is 1.8=2.2 & we will watch her CBC/ Fe as well...  CHOL>  On diet + FishOil;  FLP looks ok..  Persistent GI symptoms>  She had CT Abd 1/12 while in hosp- as described; She has had a GI eval from PGuenter & DrKaplan- notes reviewed & they do not plan any further w/u at this time; Suggest trial BENTYL 20mg  Tid & adjust Miralax & Senakot dosing...  DJD/ LBP/ Osteop>  On Tramadol, Alendronate, Calcium, MVI, Vit D...  Persistent bilat wrist & hand symptoms>  Suggestive of CTS but pt states no better after prolonged wrist splint therapy; we discussed need for NCVs & referral to hand surg for further consideration...  ANEMIA>  She has been repleted orally w/ Fe & VitC...   Patient's Medications  New Prescriptions   No medications on file  Previous Medications   ACETAMINOPHEN (TYLENOL) 500 MG TABLET    Take 650 mg by mouth every 8 (eight) hours as needed. Take with tramadol as needed for pain   ALENDRONATE (FOSAMAX) 70 MG TABLET    Take 70 mg by mouth every 7 (seven) days. Take with a full glass of water on an empty stomach.    ASCORBIC ACID (VITAMIN C) 1000 MG TABLET    Take 1,000 mg by mouth daily.     BIOTIN 1000 MCG TABLET    Take 1,000 mcg by mouth daily.     CALCIUM CARBONATE-VITAMIN D (CALCIUM 600+D) 600-400 MG-UNIT PER TABLET    Take 2 tablets by mouth daily.     CHOLECALCIFEROL (VITAMIN D) 1000 UNITS CAPSULE    Take 1,000 Units by mouth daily.     FERROUS  SULFATE 325 (65 FE) MG TABLET    Take 325 mg by mouth daily with breakfast.    FUROSEMIDE (LASIX) 40 MG TABLET    Take 1 tablet (40 mg total)  by mouth daily. Or as directed   GLUCOSAMINE 500 MG CAPS    Take 1 capsule by mouth daily.     OMEGA-3 350 MG CAPS    Take 1 capsule by mouth daily.     OMEPRAZOLE (PRILOSEC) 20 MG CAPSULE    Take 20 mg by mouth daily.     POTASSIUM CHLORIDE (KLOR-CON) 10 MEQ CR TABLET    Take 1 tablet (10 mEq total) by mouth 2 (two) times daily.   TOLTERODINE (DETROL LA) 2 MG 24 HR CAPSULE    Take 2 mg by mouth 2 (two) times daily.     TRAMADOL (ULTRAM) 50 MG TABLET    Take 50 mg by mouth every 6 (six) hours as needed.     VITAMIN A 8000 UNIT CAPSULE    Take 8,000 Units by mouth every other day.     VITAMIN E 400 UNIT CAPSULE    Take 400 Units by mouth daily.     WARFARIN (COUMADIN) 2.5 MG TABLET    Take as directed  Modified Medications   No medications on file  Discontinued Medications   No medications on file

## 2011-12-12 NOTE — Patient Instructions (Signed)
Today we updated your med list in our EPIC system...    Continue your current medications the same...  Today we did your follow up fasting blood work...    Please call the PHONE TREE in a few days for your results...    Dial [id number] & when prompted enter your patient number followed by the # symbol...    Your patient number is:  [id number]#  We will send a copy of your labs to [facility]...  Call for any questions...  Let's plan another check up in a similar 4-6 months.Marland KitchenMarland Kitchen

## 2011-12-28 ENCOUNTER — Ambulatory Visit (INDEPENDENT_AMBULATORY_CARE_PROVIDER_SITE_OTHER): Payer: Medicare Other | Admitting: *Deleted

## 2011-12-28 DIAGNOSIS — I4891 Unspecified atrial fibrillation: Secondary | ICD-10-CM

## 2011-12-28 DIAGNOSIS — Z7901 Long term (current) use of anticoagulants: Secondary | ICD-10-CM | POA: Diagnosis not present

## 2012-01-10 ENCOUNTER — Encounter: Payer: Self-pay | Admitting: Pulmonary Disease

## 2012-01-11 ENCOUNTER — Ambulatory Visit (INDEPENDENT_AMBULATORY_CARE_PROVIDER_SITE_OTHER): Payer: Medicare Other

## 2012-01-11 DIAGNOSIS — I4891 Unspecified atrial fibrillation: Secondary | ICD-10-CM

## 2012-01-11 DIAGNOSIS — Z7901 Long term (current) use of anticoagulants: Secondary | ICD-10-CM | POA: Diagnosis not present

## 2012-01-22 ENCOUNTER — Encounter: Payer: Self-pay | Admitting: Internal Medicine

## 2012-01-22 DIAGNOSIS — I495 Sick sinus syndrome: Secondary | ICD-10-CM | POA: Diagnosis not present

## 2012-02-01 ENCOUNTER — Ambulatory Visit (INDEPENDENT_AMBULATORY_CARE_PROVIDER_SITE_OTHER): Payer: Medicare Other | Admitting: *Deleted

## 2012-02-01 DIAGNOSIS — Z7901 Long term (current) use of anticoagulants: Secondary | ICD-10-CM | POA: Diagnosis not present

## 2012-02-01 DIAGNOSIS — I4891 Unspecified atrial fibrillation: Secondary | ICD-10-CM

## 2012-02-01 LAB — POCT INR: INR: 1.5

## 2012-02-18 DIAGNOSIS — M19039 Primary osteoarthritis, unspecified wrist: Secondary | ICD-10-CM | POA: Diagnosis not present

## 2012-02-22 ENCOUNTER — Ambulatory Visit (INDEPENDENT_AMBULATORY_CARE_PROVIDER_SITE_OTHER): Payer: Medicare Other | Admitting: *Deleted

## 2012-02-22 DIAGNOSIS — I4891 Unspecified atrial fibrillation: Secondary | ICD-10-CM | POA: Diagnosis not present

## 2012-02-22 DIAGNOSIS — Z7901 Long term (current) use of anticoagulants: Secondary | ICD-10-CM | POA: Diagnosis not present

## 2012-02-22 LAB — POCT INR: INR: 2

## 2012-03-14 ENCOUNTER — Ambulatory Visit (INDEPENDENT_AMBULATORY_CARE_PROVIDER_SITE_OTHER): Payer: Medicare Other | Admitting: *Deleted

## 2012-03-14 DIAGNOSIS — Z7901 Long term (current) use of anticoagulants: Secondary | ICD-10-CM | POA: Diagnosis not present

## 2012-03-14 DIAGNOSIS — I4891 Unspecified atrial fibrillation: Secondary | ICD-10-CM | POA: Diagnosis not present

## 2012-04-11 ENCOUNTER — Ambulatory Visit (INDEPENDENT_AMBULATORY_CARE_PROVIDER_SITE_OTHER): Payer: Medicare Other | Admitting: *Deleted

## 2012-04-11 DIAGNOSIS — I6350 Cerebral infarction due to unspecified occlusion or stenosis of unspecified cerebral artery: Secondary | ICD-10-CM

## 2012-04-11 DIAGNOSIS — Z7901 Long term (current) use of anticoagulants: Secondary | ICD-10-CM | POA: Diagnosis not present

## 2012-04-11 DIAGNOSIS — I4891 Unspecified atrial fibrillation: Secondary | ICD-10-CM | POA: Diagnosis not present

## 2012-04-11 DIAGNOSIS — Z950 Presence of cardiac pacemaker: Secondary | ICD-10-CM

## 2012-04-11 DIAGNOSIS — I442 Atrioventricular block, complete: Secondary | ICD-10-CM

## 2012-04-11 DIAGNOSIS — I639 Cerebral infarction, unspecified: Secondary | ICD-10-CM

## 2012-04-11 MED ORDER — WARFARIN SODIUM 5 MG PO TABS: ORAL_TABLET | ORAL | Status: DC

## 2012-04-22 DIAGNOSIS — I495 Sick sinus syndrome: Secondary | ICD-10-CM

## 2012-04-25 ENCOUNTER — Other Ambulatory Visit (INDEPENDENT_AMBULATORY_CARE_PROVIDER_SITE_OTHER): Payer: Medicare Other

## 2012-04-25 ENCOUNTER — Encounter: Payer: Self-pay | Admitting: Pulmonary Disease

## 2012-04-25 ENCOUNTER — Ambulatory Visit (INDEPENDENT_AMBULATORY_CARE_PROVIDER_SITE_OTHER): Payer: Medicare Other | Admitting: Pulmonary Disease

## 2012-04-25 VITALS — BP 142/62 | HR 78 | Temp 97.0°F | Ht 59.0 in | Wt 91.6 lb

## 2012-04-25 DIAGNOSIS — M810 Age-related osteoporosis without current pathological fracture: Secondary | ICD-10-CM

## 2012-04-25 DIAGNOSIS — I4891 Unspecified atrial fibrillation: Secondary | ICD-10-CM | POA: Diagnosis not present

## 2012-04-25 DIAGNOSIS — E7800 Pure hypercholesterolemia, unspecified: Secondary | ICD-10-CM

## 2012-04-25 DIAGNOSIS — D649 Anemia, unspecified: Secondary | ICD-10-CM

## 2012-04-25 DIAGNOSIS — M545 Low back pain, unspecified: Secondary | ICD-10-CM

## 2012-04-25 DIAGNOSIS — R0989 Other specified symptoms and signs involving the circulatory and respiratory systems: Secondary | ICD-10-CM

## 2012-04-25 DIAGNOSIS — N318 Other neuromuscular dysfunction of bladder: Secondary | ICD-10-CM

## 2012-04-25 DIAGNOSIS — J989 Respiratory disorder, unspecified: Secondary | ICD-10-CM | POA: Diagnosis not present

## 2012-04-25 DIAGNOSIS — R0600 Dyspnea, unspecified: Secondary | ICD-10-CM

## 2012-04-25 DIAGNOSIS — B351 Tinea unguium: Secondary | ICD-10-CM

## 2012-04-25 DIAGNOSIS — I2510 Atherosclerotic heart disease of native coronary artery without angina pectoris: Secondary | ICD-10-CM | POA: Diagnosis not present

## 2012-04-25 DIAGNOSIS — Z950 Presence of cardiac pacemaker: Secondary | ICD-10-CM | POA: Diagnosis not present

## 2012-04-25 DIAGNOSIS — K5730 Diverticulosis of large intestine without perforation or abscess without bleeding: Secondary | ICD-10-CM

## 2012-04-25 DIAGNOSIS — I5032 Chronic diastolic (congestive) heart failure: Secondary | ICD-10-CM

## 2012-04-25 DIAGNOSIS — M1990 Unspecified osteoarthritis, unspecified site: Secondary | ICD-10-CM

## 2012-04-25 LAB — HEPATIC FUNCTION PANEL
ALT: 20 U/L (ref 0–35)
AST: 59 U/L — ABNORMAL HIGH (ref 0–37)
Albumin: 4.3 g/dL (ref 3.5–5.2)
Alkaline Phosphatase: 41 U/L (ref 39–117)
Total Protein: 7 g/dL (ref 6.0–8.3)

## 2012-04-25 LAB — CBC WITH DIFFERENTIAL/PLATELET
Basophils Absolute: 0 10*3/uL (ref 0.0–0.1)
HCT: 36.5 % (ref 36.0–46.0)
Lymphocytes Relative: 31.2 % (ref 12.0–46.0)
Lymphs Abs: 1.5 10*3/uL (ref 0.7–4.0)
Monocytes Relative: 8.2 % (ref 3.0–12.0)
Neutrophils Relative %: 58.3 % (ref 43.0–77.0)
Platelets: 161 10*3/uL (ref 150.0–400.0)
RDW: 13.3 % (ref 11.5–14.6)

## 2012-04-25 LAB — BASIC METABOLIC PANEL
CO2: 29 mEq/L (ref 19–32)
Chloride: 101 mEq/L (ref 96–112)
Glucose, Bld: 83 mg/dL (ref 70–99)
Potassium: 4.6 mEq/L (ref 3.5–5.1)
Sodium: 139 mEq/L (ref 135–145)

## 2012-04-25 LAB — BRAIN NATRIURETIC PEPTIDE: Pro B Natriuretic peptide (BNP): 316 pg/mL — ABNORMAL HIGH (ref 0.0–100.0)

## 2012-04-25 MED ORDER — FLUOCINONIDE-E 0.05 % EX CREA: TOPICAL_CREAM | Freq: Two times a day (BID) | CUTANEOUS | Status: DC

## 2012-04-25 MED ORDER — FLUOCINONIDE-E 0.05 % EX CREA: TOPICAL_CREAM | Freq: Two times a day (BID) | CUTANEOUS | Status: AC

## 2012-04-25 MED ORDER — TERBINAFINE HCL 250 MG PO TABS: 250.0000 mg | ORAL_TABLET | Freq: Every day | ORAL | Status: DC

## 2012-04-25 NOTE — Progress Notes (Signed)
Subjective:    Patient ID: [name], female    DOB: [birth year], 78 y.o.   MRN: [id number]  HPI 78 y/o WF here for a follow up visit... she has multiple medical problems including:  Hx Asthmatic bronchitis/ Pneumonia;  CAD & ASD repair;  AFib & Pacemaker;  Carotid Art dis;  Ven Insuffic;  Hyperchol;  Divertics;  s/p bilat inguinal hernia repairs 8/11;  Overactive bladder;  DJD/ LBP/ Osteoporosis;  Anemia & bilat rectus muscle hematomas 1/12...   ~  December 14, 2010:  [name] was hosp 1/21 - 12/10/10 by [facility] w/ RLL pneumonia- NOS & resolved w/ antibiotic Rx; Labs showed Anemia w/ Hg 11.0==>8.1, MCV~99 & Fe<10, Ferritin=167, B12=581, Folate=11, Protimes w/ INR= 2.1-2.9, & CTAbd w/ bilat rectus musc hematomas (?spontaneous hemorrhage);  Note: 3/11 Hg=12.0, & 8/11 Hg=11.8 (MCV=106);  Stool heme neg here today; last colon check 1995 & neg x divertics;  She was disch off Coumadin, on FeSO4, not on PPI, & ret today for f/u labs> feeling weak, tired, congested, sl nausea, no vomiting, vague abd pains, & bruising; Note: Hospitalist & Radiologist didn't feel the rectus hematomas were large enough to account for the anemia (& acute bleed wouldn't lead to Fe<10); she denies any prev hint of bleeding- no melena, BRB, etc.    We plan f/u CXR (approaching baseline w/ min resid RLL markings & blunt angle); & LABS/ Anemia eval (Hg improved to 10.5, MCV=109, Fe=78 & 21%sat, PT INR=1.1, BNP=324, stool heme neg, SPE=neg).  ~  February 09, 2011:  2 month ROV & stable but she has persist GI symptoms- vague discomfort, swelling, constip "there's something there" she says> offered GI referral (she wants to hold off) vs incr laxatives w/ Miralax & Senakot-S to establish soft easy to pass BMs;  We added Omep 20mg /d last OV but she stopped it on her own & doesn't want to take it;  She saw DrKlein 2/12 for f/u AFib/ pacer, he reviewed her Echo from 1/12 & decided to leave the Coumadin off for now w/ rov in 46mo...    Resp>  She has  Mucinex for Prn use but not taking any regular breathing meds...    Cards>  She has CAD, Hx ASD repair, AFib, Pacer, Carotid dis, pulmHTN;  Coumadin on HOLD & taking Lasix40mg /d w/ K10Bid;  2DEcho 1/12 showed EF 65-70%, mild LVH, restricted AV motion with mean gradient of 15 mmHg, mod MR, mod-severe LAE and mod RAE, mod TR, mod PR, PASP 60...    Chol>  On Fish Oil supplements & last FLP 3/11 looked pretty good;  Her wt is stable at 92 lbs...     Ortho>  This has been her biggest complaint- DJD, LBP, known sp stenosis, L3 compression, osteoporosis;  She remains on Alendronate, Calcium, MVI, Vit D, & uses Tramadol & Tylenol for pain...  ~  Apr 11, 2011:  57mo ROV & she persists w/ mult somatic complaints- eg. Heel feels funny, fingertips numb, toenails turns yellow then back to normal, etc... She has persistant abd discomfort "a pressure" across her abd & assoc nausea- she wonders if related to her double hernia surg w/ mesh last yr? Recall hosp 1/12 w/ pneumonia & she developed anemia w/ Hg down to 8.1 & CT showed bilat rectus sheath hematoms R>L (? If this was enough to explain the anemia);  We discussed the NEED for further eval by GI at this point (as she has refused further eval previously but the symptoms  are persistant) & we will refer to GI...  ~  July 25, 2011:  3-65mo ROV & she was eval by PGuenter/ DrKaplan in GI> for 2 problems: her persistent abd symptoms (bloating, gas, crampy pain, constip, nausea, etc); and her anemia w/ Fe<10 during the prev hosp; they felt that the abd pain was musculoskeletal in nature & rec Celebrex trial + Prilosec, and she really doesn't feel that this helped much she says; stools were neg for blood; Hg improved to 12.2; no further work up recommended...  Today she notes some lower abd cramping pain off & on assoc w/ some gas, nausea, etc> we discussed trying BENTYL 20mg  Tid to see if this helps...    She saw DrKlein 6/12 for f/u AFib, heart block w/ pacer> he decided  to leave her off the coumadin & pacer check was fine...    She saw DrRamos 7/12 for f/u of her severe spinal stenosis at L4-5 w/ selective nerve root blocks attempted w/ some temporary benefit...    She is also c/o persistant wrist & hand symptoms w/ paresthesias & pain suggestive of CTS> we rec wrist splints in the past but says the discomfort is no better, therefore we will need to refer to hand surg (DrGramig) for NCV & consideration of surg...  ~  December 12, 2011:  4-5 month ROV & her CC remains back pain from her known sp stenosis treated by DrRamos et al; she has mult somatic complaints including cheilitis treated by TP w/ vaseline/ chapstick & improved; she is managing reasonably well w. Daughter's help at home...    She saw DrKlein 12/12 for f/u permanent AFib (s/p ablation x2 & pacer); he thought that she might have had a TIA & restarted her Coumadin (recall hx IDA w/ Hg=8 & Fe<10 w/ rectal shealth hematomas 2/12); he set INR target 1.8 to 2.2;  Pacer check was ok & noted 100% pacing rhythm...    She reports f/u eval by Hulda Humphrey & she is going to start back on Enablex for her voiding symptoms... Labs today showed FLP ok on diet alone; Chems- wnl on Lasix & KCl; TSH & VitD are ok... Recent CBC showed Hg= 11.7, MCV= 108, Fe= 79 (26%sat), B12= 460...  ~  April 25, 2012:  54mo ROV & [name]'s CC= fungus on her toenails, and a mild dermatitis- we discussed Rx w/ LAMISIL & LIDEX-E cream...  Coumadin narrowly regulated by the [facility] w/ INR goal 1.8-2.2 range & no bleeding prob w/ this tight control... Resp & Cardiac stable> labs look good & BNP is stable ~316...    We reviewed prob list, meds, xrays and labs> see below>> LABS 6/13:  Chems- wnl;  CBC- improved w/ Hg=12.1;  BNP=316...          Problem List:   RESPIRATORY DISORDER, CHRONIC (ICD-519.9) - on MUCINEX 600mg  1-2 Bid... she is a never smoker w/ hx obstructive asthmatic bronchitis over the years treated by DrESL... baseline CXR w/  overinflated lungs, decr markings, enlarged heart, NAD.... ~  CXR 9/11 in hosp showed cardiomeg, NAD. ~  Adm 1/12 w/ RLL pneumonia- NOS, improved w/ antibiotics back to baseline. ~  CXR 2/12 showed stable cardiomegaly, prev median sternotomy, pacer, lungs clear & hyperaerated c/w COPD, DJD/ osteopenia...  CORONARY ARTERY DISEASE (ICD-414.00) Hx of ATRIAL SEPTAL DEFECT (ICD-745.5) - she has known non-obstructive CAD and is followed by DrCooper... she had an ASD repair at [facility] in [facility]... she was on COUMADIN until 1/12 hosp w/  pneumonia> had therapeutic protimes w/ INR= 2.1-2.9 but developed anemia (Hg=8.1), bilat rectus musc hematomas, Fe<10, etc... taken off Coumadin at that point. ~  last cath 1996 showed 40% Lmain, 30% proxLAD, 30%procRCA, normCIRC... ~  negative persantine cardiolite 10/97 with EF= 82%... ~  9/11:  she denies CP, palpit, syncope, change in SOB or edema... ~  6/13:  Labs are stable w/ BUN=16, Creat=0.6, BNP= 316  ATRIAL FIBRILLATION (ICD-427.31) PACEMAKER, PERMANENT (ICD-V45.01) - she is followed by DrKlein> s/p AV junction ablation attempts x2- now considered permanent AFib & pacer placed 3/05 for SSS, generator changed 12/09. ~  2DEcho 5/06 showed mild dil LA,  mild AI & AoV sclerosis, mod MR, EF= 65%... ~  EKG 1/11 showed Pacer rhythm... ~  seen by DrKlein 8/11- stable, pacer OK, no changes made. ~  2DEcho 1/12 showed mild LVH, norm wall motion & EF=65-70%, AoV thickened/ calcif/ restricted w/ mean gradient=15, RA & LA dilated, PAsys=6mmHg. ~  1/12:  Coumadin stopped during [facility] for RLL pneumonia w/ anemia (Hg=8, Fe<10, bilat spontaneous rectus hematomas, other investig for source of bleeding/ low Fe was neg)... ~  12/12:  DrKlein restarted her Coumadin w/ INR goal 1.8-2.2 due to ?TIA & her permanent AFib that he follows...  CAROTID BRUIT (ICD-785.9) - she had CDopplers 1996 showing mild irreg plaque bilat, no signif stenosis... she has a bruit on exam-  ~  CDopplers  9/09 showed mild irreg heterogeneous plaque, 0-39% bilat ICA stenoses... ~  CDoppler 9/10 showed similar findings w/ 0-39% RICA stenosis & 40-59% LICA stenosis... f/u 20yr. ~  CDoppler 9/11 showed same findings- repeat 60yr. ~  CDoppler 9/12 showed mild heterogen plaque in bulbs, 0-39% bilat ICA stenoses (improved on left)...  VENOUS INSUFFICIENCY (ICD-459.81) - she follows a low sodium diet and takes LASIX 40mg /d + MicroK-10 Bid...  HYPERCHOLESTEROLEMIA (ICD-272.0) - now off Vytorin due to muscle symptoms and doing much better... on Omega-3 Fish Oil-  ~  FLP 5/08 on Vytorin10-20 showed TChol 91, TG 47, HDL 41, LDL 41 ~  FLP 10/08 on Vytorin10-20 (1/2 tab daily) showed TChol 139, TG 58, HDL 58, LDL 70 ~  FLP 9/09 off Vytorin x39mo showed TChol 158, TG 38, HDL 46, LDL 104 ~  FLP 3/10 showed TChol 197, TG 51, HDL 57, LDL 129 ~  FLP 3/11 showed TChol 192, TG 86, HDL 70, LDL 105 ~  FLP 1/13 showed TChol 187, TG 80, HDL 67, LDL 104  DIVERTICULOSIS OF COLON (ICD-562.10) - FlexSig 1995 by DrPatterson showed divertics only...  INGUINAL HERNIAs >>> s/p bilat open inguinal hernia repairs w/ mesh by Jean Rosenthal 8/11...  OVERACTIVE BLADDER (ICD-596.51) - she's treated by Hulda Humphrey w/ Detrol==> Enablex...  DEGENERATIVE JOINT DISEASE (ICD-715.90) BACK PAIN, LUMBAR (ICD-724.2) - she has mod LBP and known spinal stenosis... she saw DrSypher in 2008 for osteoarthritis left hand w/ cyst on thumb (surg 10/08)... on TRAMADOL per DrRamos. ~  she has severe sp stenosis L4-5, & mod sp stenosis L3-4 >> CT scans of sp & hips showed severe scoliosis w/ advanced DDD & canal & foraminal stenosis, osteophytes, osteopenia, calcif vessels, divertics, sm left inguinal hernia. ~  3/11:  we tried Duragesic-25 patches for her pain but it made her nauseous, dizzy & loopy so she stopped. ~  6/11:  she tells me that DrRamos has referred her to Neuro- DrYan> no neuropathy & no radiculopathy by report. ~  7/12:  DrRamos tried  selective nerve root block L4-5 right; she reports min benefit.Marland KitchenMarland Kitchen  OSTEOPOROSIS (ICD-733.00) - she takes ALENDRONATE 70mg /wk, Calcium, Vits, etc... she has L3 compression. ~  BMD here 8/08 showed TScores +0.4 in Spine, and -3.4 in left FemNeck... ~  BMD here 9/10 showed TScores +0.9 in Spine, and -3.5 in [facility] ~  Labs 1/13 showed Vit D level = 52  ANEMIA (ICD-285.9)  << SEE ABOVE >> ~  Hg nadir 1/12 hosp was 8.1 & Fe was <10 ~  Labs 3/12 showed Hg= 12.0, Fe= 82 (22%sat) ~  Labs 6/12 showed Hg= 12.2, MCV= 107, stool neg for occult blood. ~  Labs 11/12 showed Hg= 11.7, MCV= 108, Fe= 79 (26%sat), B12= 460 ~  Labs 6/13 showed Hg= 12.1, MCV= 107  ONYCHOMYCOSIS >> toenail fungus w/ thickened great toenails etc... Trial Lamisil 250mg /d started 6/13...   Past Medical History  Diagnosis Date  . Pneumonia, organism unspecified   . Unspecified disease of respiratory system   . CAD (coronary artery disease)   . Ostium secundum type atrial septal defect   . AV block   . Presence of permanent cardiac pacemaker   . Venous insufficiency   . Pure hypercholesterolemia   . Diverticulosis of colon (without mention of hemorrhage)   . Overactive bladder   . DJD (degenerative joint disease)   . LBP (low back pain)   . Chronic pain syndrome   . Osteoporosis   . Anemia     Past Surgical History  Procedure Date  . Vesicovaginal fistula closure w/ tah 1973  . Cataract extraction   . Asd repair   . Pacemaker placement 1996    [employer]  . Inguinal hernia repair 2011    bilaterally  . Shoulder surgery     x 2  . Ganglion cyst excision   . Partial hysterectomy     Outpatient Encounter Prescriptions as of 04/25/2012  Medication Sig Dispense Refill  . acetaminophen (TYLENOL) 500 MG tablet Take 650 mg by mouth every 8 (eight) hours as needed. Take with tramadol as needed for pain      . alendronate (FOSAMAX) 70 MG tablet Take 70 mg by mouth every 7 (seven) days. Take with a full glass of water  on an empty stomach.       . Ascorbic Acid (VITAMIN C) 1000 MG tablet Take 1,000 mg by mouth daily.        . Biotin 1000 MCG tablet Take 1,000 mcg by mouth daily.        . Calcium Carbonate-Vitamin D (CALCIUM 600+D) 600-400 MG-UNIT per tablet Take 2 tablets by mouth daily.        . Cholecalciferol (VITAMIN D) 1000 UNITS capsule Take 1,000 Units by mouth daily.        . ferrous sulfate 325 (65 FE) MG tablet Take 325 mg by mouth daily with breakfast.       . furosemide (LASIX) 40 MG tablet Take 1 tablet (40 mg total) by mouth daily. Or as directed  90 tablet  1  . Glucosamine 500 MG CAPS Take 1 capsule by mouth daily.        . Omega-3 350 MG CAPS Take 1 capsule by mouth daily.        Marland Kitchen omeprazole (PRILOSEC) 20 MG capsule Take 20 mg by mouth daily.        . potassium chloride (KLOR-CON) 10 MEQ CR tablet Take 1 tablet (10 mEq total) by mouth 2 (two) times daily.  180 tablet  3  . tolterodine (DETROL LA) 2 MG 24  hr capsule Take 2 mg by mouth 2 (two) times daily.        . traMADol (ULTRAM) 50 MG tablet Take 50 mg by mouth every 6 (six) hours as needed.        . vitamin A 8000 UNIT capsule Take 8,000 Units by mouth every other day.        . vitamin E 400 UNIT capsule Take 400 Units by mouth daily.        Marland Kitchen warfarin (COUMADIN) 5 MG tablet Take as directed by coumadin clinic  90 tablet  0  . fluocinonide-emollient (LIDEX-E) 0.05 % cream Apply topically 2 (two) times daily.  30 g  2  . terbinafine (LAMISIL) 250 MG tablet Take 1 tablet (250 mg total) by mouth daily.  30 tablet  3  . DISCONTD: fluocinonide-emollient (LIDEX-E) 0.05 % cream Apply topically 2 (two) times daily.  30 g  2  . DISCONTD: terbinafine (LAMISIL) 250 MG tablet Take 1 tablet (250 mg total) by mouth daily.  30 tablet  3    Allergies  Allergen Reactions  . Amiodarone Hcl     REACTION: INTOL to Amiodarone in past  . Codeine   . Fentanyl     REACTION: causes nausea--dizziness    Current Medications, Allergies, Past Medical  History, Past Surgical History, Family History, and Social History were reviewed in [employer] record.    Review of Systems        See HPI - all other systems neg except as noted... The patient complains of anorexia, decreased hearing, dyspnea on exertion, peripheral edema, prolonged cough, muscle weakness, and difficulty walking.  The patient denies fever, weight loss, weight gain, vision loss, hoarseness, chest pain, syncope, headaches, hemoptysis, abdominal pain, melena, hematochezia, severe indigestion/heartburn, hematuria, incontinence, suspicious skin lesions, transient blindness, depression, unusual weight change, abnormal bleeding, enlarged lymph nodes, and angioedema.    Objective:   Physical Exam     WD, Thin, 78 y/o WF in NAD... appears younger that stated age.  GENERAL:  Alert & oriented; [name] & cooperative... HEENT:  [place]/AT, EOM-full, PERRLA, EACs-clear, TMs-wnl, NOSE-clear, THROAT-clear & wnl. NECK:  Supple w/ fairROM; no JVD; normal carotid impulses w/ 2+ bruit; no thyromegaly or nodules palpated; no lymphadenopathy. CHEST:  Decr BS bilat, clear to P & A; without wheezes/ rales/ or rhonchi heard... HEART:  Regular Rhythm- pacer; gr 1/6 SEM without rubs or gallops detected... ABDOMEN:  Soft & nontender; bilat inguinal hernia scars; normal bowel sounds; no organomegaly or mases palpated. EXT: without deformities, mod arthritic changes; no varicose veins/ +venous insuffic/ tr edema. NEURO:  CN's intact; no focal neuro deficits... DERM:  +Onychomycosis & mild dermatitis on her back...  RADIOLOGY DATA:  Reviewed in the EPIC EMR & discussed w/ the patient...  LABORATORY DATA:  Reviewed in the EPIC EMR & discussed w/ the patient...   Assessment & Plan:   Onychomycosis> she wants Rx- trial LAMISIL 250mg /d w/ repeat LFTs in 49mo... Plus LIDE-E cream for rash on back...   PULM>  Back to baseline w/ underlying AB etc...  Cardiac>  CAD/ Hx ASD repair/  AFib/ Pacer>  She is followed by DrKlein & he restarted her Coumadin 12/12 w/ ?TIA? And her AFIB; INR goal is 1.8-2.2 & we will watch her CBC/ Fe as well...  CHOL>  On diet + FishOil;  FLP looks ok..  Persistent GI symptoms>  She had CT Abd 1/12 while in hosp- as described; She has had a GI eval  from PGuenter & DrKaplan- notes reviewed & they do not plan any further w/u at this time; Suggest trial BENTYL 20mg  Tid & adjust Miralax & Senakot dosing...  DJD/ LBP/ Osteop>  On Tramadol, Alendronate, Calcium, MVI, Vit D...  Persistent bilat wrist & hand symptoms>  Suggestive of CTS but pt states no better after prolonged wrist splint therapy; we discussed need for NCVs & referral to hand surg for further consideration...  ANEMIA>  She has been repleted orally w/ Fe & VitC...   Patient's Medications  New Prescriptions   FLUOCINONIDE-EMOLLIENT (LIDEX-E) 0.05 % CREAM    Apply topically 2 (two) times daily.   TERBINAFINE (LAMISIL) 250 MG TABLET    Take 1 tablet (250 mg total) by mouth daily.  Previous Medications   ACETAMINOPHEN (TYLENOL) 500 MG TABLET    Take 650 mg by mouth every 8 (eight) hours as needed. Take with tramadol as needed for pain   ALENDRONATE (FOSAMAX) 70 MG TABLET    Take 70 mg by mouth every 7 (seven) days. Take with a full glass of water on an empty stomach.    ASCORBIC ACID (VITAMIN C) 1000 MG TABLET    Take 1,000 mg by mouth daily.     BIOTIN 1000 MCG TABLET    Take 1,000 mcg by mouth daily.     CALCIUM CARBONATE-VITAMIN D (CALCIUM 600+D) 600-400 MG-UNIT PER TABLET    Take 2 tablets by mouth daily.     CHOLECALCIFEROL (VITAMIN D) 1000 UNITS CAPSULE    Take 1,000 Units by mouth daily.     FERROUS SULFATE 325 (65 FE) MG TABLET    Take 325 mg by mouth daily with breakfast.    FUROSEMIDE (LASIX) 40 MG TABLET    Take 1 tablet (40 mg total) by mouth daily. Or as directed   GLUCOSAMINE 500 MG CAPS    Take 1 capsule by mouth daily.     OMEGA-3 350 MG CAPS    Take 1 capsule by mouth  daily.     OMEPRAZOLE (PRILOSEC) 20 MG CAPSULE    Take 20 mg by mouth daily.     POTASSIUM CHLORIDE (KLOR-CON) 10 MEQ CR TABLET    Take 1 tablet (10 mEq total) by mouth 2 (two) times daily.   TOLTERODINE (DETROL LA) 2 MG 24 HR CAPSULE    Take 2 mg by mouth 2 (two) times daily.     TRAMADOL (ULTRAM) 50 MG TABLET    Take 50 mg by mouth every 6 (six) hours as needed.     VITAMIN A 8000 UNIT CAPSULE    Take 8,000 Units by mouth every other day.     VITAMIN E 400 UNIT CAPSULE    Take 400 Units by mouth daily.     WARFARIN (COUMADIN) 5 MG TABLET    Take as directed by coumadin clinic  Modified Medications   No medications on file  Discontinued Medications   No medications on file

## 2012-04-25 NOTE — Patient Instructions (Addendum)
Today we updated your med list in our EPIC system...    Continue your current medications the same...  We decided to try medication therapy for the nail fungus> generic LAMISIL 250mg  one tab per day...  We also wrote a new prescription for a cream to apply to your mild skin rash...  Today we did your follow up blood work...    We will call you w/ the report when avail...  Call for any problems...  Let's plan a follow up visit in 4 months to check your progress.Marland KitchenMarland Kitchen

## 2012-04-28 ENCOUNTER — Other Ambulatory Visit: Payer: Self-pay | Admitting: Pulmonary Disease

## 2012-04-28 DIAGNOSIS — E7800 Pure hypercholesterolemia, unspecified: Secondary | ICD-10-CM

## 2012-04-28 DIAGNOSIS — I4891 Unspecified atrial fibrillation: Secondary | ICD-10-CM

## 2012-04-29 ENCOUNTER — Other Ambulatory Visit: Payer: Self-pay | Admitting: Internal Medicine

## 2012-04-29 MED ORDER — FUROSEMIDE 40 MG PO TABS: 40.0000 mg | ORAL_TABLET | Freq: Every day | ORAL | Status: DC

## 2012-05-02 DIAGNOSIS — L578 Other skin changes due to chronic exposure to nonionizing radiation: Secondary | ICD-10-CM | POA: Diagnosis not present

## 2012-05-02 DIAGNOSIS — L570 Actinic keratosis: Secondary | ICD-10-CM | POA: Diagnosis not present

## 2012-05-08 ENCOUNTER — Ambulatory Visit (INDEPENDENT_AMBULATORY_CARE_PROVIDER_SITE_OTHER): Payer: Medicare Other

## 2012-05-08 DIAGNOSIS — I4891 Unspecified atrial fibrillation: Secondary | ICD-10-CM

## 2012-05-08 DIAGNOSIS — Z7901 Long term (current) use of anticoagulants: Secondary | ICD-10-CM

## 2012-05-20 ENCOUNTER — Telehealth: Payer: Self-pay | Admitting: Pulmonary Disease

## 2012-05-20 NOTE — Telephone Encounter (Signed)
I spoke with [name] and she states the lamsil makes her not feel good. Pt is very weak, light HA almost everyday, upset stomach, and it makes her dizzy and face feels flushed since starting this medication. They are wanting to d/c this. Please advise Dr. Kriste Basque, thanks

## 2012-05-20 NOTE — Telephone Encounter (Signed)
I spoke with pt and made her aware of SN recs since daughter and went out to the store. She voiced her understanding

## 2012-05-20 NOTE — Telephone Encounter (Signed)
Per SN---she can stop the lamisil.  thanks

## 2012-05-22 ENCOUNTER — Ambulatory Visit (INDEPENDENT_AMBULATORY_CARE_PROVIDER_SITE_OTHER): Payer: Medicare Other | Admitting: *Deleted

## 2012-05-22 DIAGNOSIS — Z7901 Long term (current) use of anticoagulants: Secondary | ICD-10-CM

## 2012-05-22 DIAGNOSIS — I4891 Unspecified atrial fibrillation: Secondary | ICD-10-CM | POA: Diagnosis not present

## 2012-06-05 ENCOUNTER — Telehealth: Payer: Self-pay | Admitting: Pulmonary Disease

## 2012-06-05 NOTE — Telephone Encounter (Signed)
Pt's daughter, Sedalia Muta, can also be reached at [phone].  Antionette Fairy

## 2012-06-05 NOTE — Telephone Encounter (Signed)
Pt's daughter aware SN and TP are out of the office and pt should really have her urine checked before being prescribed an abx. Pt is in [place] with the daughter and they will take her to an urgent care so urine can be checked.

## 2012-06-24 ENCOUNTER — Ambulatory Visit (INDEPENDENT_AMBULATORY_CARE_PROVIDER_SITE_OTHER): Payer: Medicare Other | Admitting: *Deleted

## 2012-06-24 DIAGNOSIS — Z7901 Long term (current) use of anticoagulants: Secondary | ICD-10-CM

## 2012-06-24 DIAGNOSIS — I4891 Unspecified atrial fibrillation: Secondary | ICD-10-CM | POA: Diagnosis not present

## 2012-07-22 DIAGNOSIS — I495 Sick sinus syndrome: Secondary | ICD-10-CM | POA: Diagnosis not present

## 2012-07-25 ENCOUNTER — Other Ambulatory Visit: Payer: Self-pay | Admitting: Cardiology

## 2012-07-25 DIAGNOSIS — I6529 Occlusion and stenosis of unspecified carotid artery: Secondary | ICD-10-CM

## 2012-08-01 ENCOUNTER — Encounter (INDEPENDENT_AMBULATORY_CARE_PROVIDER_SITE_OTHER): Payer: Medicare Other

## 2012-08-01 ENCOUNTER — Ambulatory Visit (INDEPENDENT_AMBULATORY_CARE_PROVIDER_SITE_OTHER): Payer: Medicare Other

## 2012-08-01 DIAGNOSIS — I4891 Unspecified atrial fibrillation: Secondary | ICD-10-CM

## 2012-08-01 DIAGNOSIS — I6529 Occlusion and stenosis of unspecified carotid artery: Secondary | ICD-10-CM

## 2012-08-01 DIAGNOSIS — Z7901 Long term (current) use of anticoagulants: Secondary | ICD-10-CM | POA: Diagnosis not present

## 2012-08-01 LAB — POCT INR: INR: 2.1

## 2012-08-22 ENCOUNTER — Other Ambulatory Visit: Payer: Self-pay

## 2012-08-22 ENCOUNTER — Other Ambulatory Visit: Payer: Self-pay | Admitting: Internal Medicine

## 2012-08-22 MED ORDER — WARFARIN SODIUM 5 MG PO TABS: ORAL_TABLET | ORAL | Status: DC

## 2012-08-25 ENCOUNTER — Ambulatory Visit (INDEPENDENT_AMBULATORY_CARE_PROVIDER_SITE_OTHER): Payer: Medicare Other | Admitting: Pulmonary Disease

## 2012-08-25 ENCOUNTER — Encounter: Payer: Self-pay | Admitting: *Deleted

## 2012-08-25 ENCOUNTER — Encounter: Payer: Self-pay | Admitting: Pulmonary Disease

## 2012-08-25 VITALS — BP 126/62 | HR 74 | Temp 98.7°F | Ht 59.0 in | Wt 97.4 lb

## 2012-08-25 DIAGNOSIS — E7800 Pure hypercholesterolemia, unspecified: Secondary | ICD-10-CM

## 2012-08-25 DIAGNOSIS — J989 Respiratory disorder, unspecified: Secondary | ICD-10-CM | POA: Diagnosis not present

## 2012-08-25 DIAGNOSIS — D649 Anemia, unspecified: Secondary | ICD-10-CM

## 2012-08-25 DIAGNOSIS — N318 Other neuromuscular dysfunction of bladder: Secondary | ICD-10-CM

## 2012-08-25 DIAGNOSIS — K5730 Diverticulosis of large intestine without perforation or abscess without bleeding: Secondary | ICD-10-CM

## 2012-08-25 DIAGNOSIS — I2510 Atherosclerotic heart disease of native coronary artery without angina pectoris: Secondary | ICD-10-CM

## 2012-08-25 DIAGNOSIS — M810 Age-related osteoporosis without current pathological fracture: Secondary | ICD-10-CM

## 2012-08-25 DIAGNOSIS — M1990 Unspecified osteoarthritis, unspecified site: Secondary | ICD-10-CM

## 2012-08-25 DIAGNOSIS — I442 Atrioventricular block, complete: Secondary | ICD-10-CM | POA: Diagnosis not present

## 2012-08-25 DIAGNOSIS — Z23 Encounter for immunization: Secondary | ICD-10-CM

## 2012-08-25 DIAGNOSIS — I872 Venous insufficiency (chronic) (peripheral): Secondary | ICD-10-CM

## 2012-08-25 DIAGNOSIS — M545 Low back pain: Secondary | ICD-10-CM

## 2012-08-25 DIAGNOSIS — B351 Tinea unguium: Secondary | ICD-10-CM

## 2012-08-25 DIAGNOSIS — I4891 Unspecified atrial fibrillation: Secondary | ICD-10-CM | POA: Diagnosis not present

## 2012-08-25 DIAGNOSIS — G894 Chronic pain syndrome: Secondary | ICD-10-CM

## 2012-08-25 DIAGNOSIS — Z950 Presence of cardiac pacemaker: Secondary | ICD-10-CM
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
## 2012-08-25 NOTE — Patient Instructions (Addendum)
Today we updated your med list in our EPIC system...    Continue your current medications the same...  Today we gave you the 2013 Flu vaccine...  Call for any problems...  Let's plan a follow up visit in 4 months w/ CXR & FASTING blood work at that time.Marland KitchenMarland Kitchen

## 2012-09-12 ENCOUNTER — Ambulatory Visit (INDEPENDENT_AMBULATORY_CARE_PROVIDER_SITE_OTHER): Payer: Medicare Other

## 2012-09-12 DIAGNOSIS — Z7901 Long term (current) use of anticoagulants: Secondary | ICD-10-CM

## 2012-09-12 DIAGNOSIS — I4891 Unspecified atrial fibrillation: Secondary | ICD-10-CM

## 2012-09-12 LAB — POCT INR: INR: 2.2

## 2012-10-08 ENCOUNTER — Other Ambulatory Visit: Payer: Self-pay

## 2012-10-08 MED ORDER — FUROSEMIDE 40 MG PO TABS: 40.0000 mg | ORAL_TABLET | Freq: Every day | ORAL | Status: DC

## 2012-10-13 ENCOUNTER — Other Ambulatory Visit: Payer: Self-pay | Admitting: Internal Medicine

## 2012-10-24 ENCOUNTER — Ambulatory Visit (INDEPENDENT_AMBULATORY_CARE_PROVIDER_SITE_OTHER): Payer: Medicare Other | Admitting: *Deleted

## 2012-10-24 DIAGNOSIS — Z7901 Long term (current) use of anticoagulants: Secondary | ICD-10-CM

## 2012-10-24 DIAGNOSIS — I4891 Unspecified atrial fibrillation: Secondary | ICD-10-CM | POA: Diagnosis not present

## 2012-10-24 LAB — POCT INR: INR: 2.3

## 2012-11-04 ENCOUNTER — Encounter: Payer: Self-pay | Admitting: Internal Medicine

## 2012-11-04 ENCOUNTER — Ambulatory Visit (INDEPENDENT_AMBULATORY_CARE_PROVIDER_SITE_OTHER): Payer: Medicare Other | Admitting: Internal Medicine

## 2012-11-04 VITALS — BP 177/60 | HR 87 | Resp 18 | Ht <= 58 in | Wt 93.2 lb

## 2012-11-04 DIAGNOSIS — I442 Atrioventricular block, complete: Secondary | ICD-10-CM | POA: Diagnosis not present

## 2012-11-04 DIAGNOSIS — Z950 Presence of cardiac pacemaker: Secondary | ICD-10-CM

## 2012-11-04 DIAGNOSIS — I5032 Chronic diastolic (congestive) heart failure: Secondary | ICD-10-CM

## 2012-11-04 DIAGNOSIS — I4891 Unspecified atrial fibrillation: Secondary | ICD-10-CM

## 2012-11-04 LAB — PACEMAKER DEVICE OBSERVATION
BATTERY VOLTAGE: 2.78 V
DEVICE MODEL PM: 2201046
VENTRICULAR PACING PM: 99

## 2012-11-04 NOTE — Assessment & Plan Note (Signed)
There is evidence of volume overload. We will have her increase her diuretics from 40 daily to 40/80x2 weeks. She will let us know how she feels. I suspect she has 5-10 pounds

## 2012-11-04 NOTE — Patient Instructions (Addendum)
Your physician wants you to follow-up in: YEAR WITH  DR Logan Bores will receive a reminder letter in the mail two months in advance. If you don't receive a letter, please call our office to schedule the follow-up appointment. Your physician has recommended you make the following change in your medication: FUROSEMIDE  40 MG TWICE  EVERY OTHER DAY  ALTERNATING WITH   1 TAB  EVERY OTHER DAY  FOR 10 DAYS

## 2012-11-04 NOTE — Assessment & Plan Note (Signed)
Stable post pacing 

## 2012-11-04 NOTE — Assessment & Plan Note (Signed)
Permanent. On anticoagulation 

## 2012-11-04 NOTE — Progress Notes (Signed)
HPI  [name] is a 78 y.o. female seen in followup for permanent atrial fibrillation. She is status post AV junction on 2 separate occasions and has a previously implanted pacemaker.    she has had problems with shortness of breath and a sense of chest heaviness. She takes her diuretics on a daily basis and sometimes takes another one.   . Major problems remains disability 2/2 back  Echo done 12/03/10 demonstrated EF 65-70%; mild LVH; restricted AV motion with mean gradient of 15 mmHg; mod MR; mod-severe LAE and mod RAE; mod TR; mod PR; PASP 60.  Na 136 following SIADH in the hospital K 3.6, Creat 0.60 (12/10/10)   Past Medical History  Diagnosis Date  . Pneumonia, organism unspecified   . Unspecified disease of respiratory system   . CAD (coronary artery disease)   . Ostium secundum type atrial septal defect   . AV block   . Presence of permanent cardiac pacemaker   . Venous insufficiency   . Pure hypercholesterolemia   . Diverticulosis of colon (without mention of hemorrhage)   . Overactive bladder   . DJD (degenerative joint disease)   . LBP (low back pain)   . Chronic pain syndrome   . Osteoporosis   . Anemia     Past Surgical History  Procedure Date  . Vesicovaginal fistula closure w/ tah 1973  . Cataract extraction   . Asd repair   . Pacemaker placement 1996    [employer]  . Inguinal hernia repair 2011    bilaterally  . Shoulder surgery     x 2  . Ganglion cyst excision   . Partial hysterectomy     Current Outpatient Prescriptions  Medication Sig Dispense Refill  . acetaminophen (TYLENOL) 500 MG tablet Take 650 mg by mouth every 8 (eight) hours as needed. Take with tramadol as needed for pain      . alendronate (FOSAMAX) 70 MG tablet Take 70 mg by mouth every 7 (seven) days. Take with a full glass of water on an empty stomach.       . Ascorbic Acid (VITAMIN C) 1000 MG tablet Take 1,000 mg by mouth daily.        . Biotin 1000 MCG tablet Take 1,000 mcg by  mouth daily.        . Calcium Carbonate-Vitamin D (CALCIUM 600+D) 600-400 MG-UNIT per tablet Take 2 tablets by mouth daily.        . Cholecalciferol (VITAMIN D) 1000 UNITS capsule Take 1,000 Units by mouth daily.        . ciprofloxacin (CIPRO) 500 MG tablet Take 1 tablet by mouth Every 12 hours.      . ferrous sulfate 325 (65 FE) MG tablet Take 325 mg by mouth daily with breakfast.       . fluocinonide-emollient (LIDEX-E) 0.05 % cream Apply topically 2 (two) times daily.  30 g  2  . furosemide (LASIX) 40 MG tablet Take 1 tablet (40 mg total) by mouth daily. Or as directed  90 tablet  0  . Glucosamine 500 MG CAPS Take 1 capsule by mouth daily.        Marland Kitchen KLOR-CON 10 10 MEQ tablet TAKE 1 TABLET TWICE A DAY  180 tablet  2  . Omega-3 350 MG CAPS Take 1 capsule by mouth daily.        Marland Kitchen omeprazole (PRILOSEC) 20 MG capsule Take 20 mg by mouth daily.        Marland Kitchen  tolterodine (DETROL LA) 2 MG 24 hr capsule Take 2 mg by mouth 2 (two) times daily.        . traMADol (ULTRAM) 50 MG tablet Take 50 mg by mouth every 6 (six) hours as needed.        . vitamin A 8000 UNIT capsule Take 8,000 Units by mouth every other day.        . vitamin E 400 UNIT capsule Take 400 Units by mouth daily.        Marland Kitchen warfarin (COUMADIN) 5 MG tablet Take as directed by coumadin clinic  90 tablet  1    Allergies  Allergen Reactions  . Amiodarone Hcl     REACTION: INTOL to Amiodarone in past  . Codeine   . Fentanyl     REACTION: causes nausea--dizziness  . Lamisil (Terbinafine Hcl)     Causes nausea    Review of Systems negative except from HPI and PMH  Physical Exam Well developed and well nourished in no acute distress HENT normal E scleral and icterus clear Neck Supple JVP 8-10 carotids brisk and full Clear to ausculation Regular rate and rhythm, no murmurs gallops or rub Soft with active bowel sounds No clubbing cyanosis 2+ Edema Alert and oriented, grossly normal motor and sensory function;There is a little bit of a  right lip droop Skin Warm and Dry   Assessment and  Plan

## 2012-11-04 NOTE — Assessment & Plan Note (Signed)
The patient's device was interrogated.  The information was reviewed. No changes were made in the programming.    

## 2012-11-20 ENCOUNTER — Telehealth: Payer: Self-pay | Admitting: Internal Medicine

## 2012-11-20 NOTE — Telephone Encounter (Signed)
Pt was to take lasix for fluid build up but it has not helped and she wants to know is something else she needs to do

## 2012-12-05 ENCOUNTER — Ambulatory Visit (INDEPENDENT_AMBULATORY_CARE_PROVIDER_SITE_OTHER): Payer: Medicare Other | Admitting: *Deleted

## 2012-12-05 DIAGNOSIS — I4891 Unspecified atrial fibrillation: Secondary | ICD-10-CM

## 2012-12-05 DIAGNOSIS — Z7901 Long term (current) use of anticoagulants: Secondary | ICD-10-CM

## 2012-12-15 ENCOUNTER — Other Ambulatory Visit: Payer: Self-pay | Admitting: Internal Medicine

## 2012-12-29 ENCOUNTER — Ambulatory Visit (INDEPENDENT_AMBULATORY_CARE_PROVIDER_SITE_OTHER): Payer: Medicare Other | Admitting: Pulmonary Disease

## 2012-12-29 ENCOUNTER — Encounter: Payer: Self-pay | Admitting: Pulmonary Disease

## 2012-12-29 ENCOUNTER — Ambulatory Visit (INDEPENDENT_AMBULATORY_CARE_PROVIDER_SITE_OTHER)
Admission: RE | Admit: 2012-12-29 | Discharge: 2012-12-29 | Disposition: A | Discharge status: Home / Self Care | Payer: Medicare Other | Source: Ambulatory Visit | Attending: Pulmonary Disease | Admitting: Pulmonary Disease

## 2012-12-29 ENCOUNTER — Ambulatory Visit (INDEPENDENT_AMBULATORY_CARE_PROVIDER_SITE_OTHER): Payer: Medicare Other

## 2012-12-29 VITALS — BP 124/60 | HR 75 | Temp 98.4°F | Ht 59.0 in | Wt 97.8 lb

## 2012-12-29 DIAGNOSIS — K5730 Diverticulosis of large intestine without perforation or abscess without bleeding: Secondary | ICD-10-CM

## 2012-12-29 DIAGNOSIS — J989 Respiratory disorder, unspecified: Secondary | ICD-10-CM | POA: Diagnosis not present

## 2012-12-29 DIAGNOSIS — M1990 Unspecified osteoarthritis, unspecified site: Secondary | ICD-10-CM

## 2012-12-29 DIAGNOSIS — I4891 Unspecified atrial fibrillation: Secondary | ICD-10-CM

## 2012-12-29 DIAGNOSIS — M545 Low back pain: Secondary | ICD-10-CM

## 2012-12-29 DIAGNOSIS — D649 Anemia, unspecified: Secondary | ICD-10-CM

## 2012-12-29 DIAGNOSIS — I872 Venous insufficiency (chronic) (peripheral): Secondary | ICD-10-CM

## 2012-12-29 DIAGNOSIS — M810 Age-related osteoporosis without current pathological fracture: Secondary | ICD-10-CM

## 2012-12-29 DIAGNOSIS — I2510 Atherosclerotic heart disease of native coronary artery without angina pectoris: Secondary | ICD-10-CM | POA: Diagnosis not present

## 2012-12-29 DIAGNOSIS — R0600 Dyspnea, unspecified: Secondary | ICD-10-CM

## 2012-12-29 DIAGNOSIS — Q211 Atrial septal defect: Secondary | ICD-10-CM | POA: Diagnosis not present

## 2012-12-29 DIAGNOSIS — R0989 Other specified symptoms and signs involving the circulatory and respiratory systems: Secondary | ICD-10-CM

## 2012-12-29 DIAGNOSIS — I5032 Chronic diastolic (congestive) heart failure: Secondary | ICD-10-CM | POA: Diagnosis not present

## 2012-12-29 DIAGNOSIS — I509 Heart failure, unspecified: Secondary | ICD-10-CM | POA: Diagnosis not present

## 2012-12-29 DIAGNOSIS — Z950 Presence of cardiac pacemaker: Secondary | ICD-10-CM

## 2012-12-29 DIAGNOSIS — R0609 Other forms of dyspnea: Secondary | ICD-10-CM

## 2012-12-29 DIAGNOSIS — E7800 Pure hypercholesterolemia, unspecified: Secondary | ICD-10-CM | POA: Diagnosis not present

## 2012-12-29 DIAGNOSIS — B351 Tinea unguium: Secondary | ICD-10-CM

## 2012-12-29 DIAGNOSIS — I442 Atrioventricular block, complete: Secondary | ICD-10-CM

## 2012-12-29 DIAGNOSIS — N318 Other neuromuscular dysfunction of bladder: Secondary | ICD-10-CM

## 2012-12-29 DIAGNOSIS — K219 Gastro-esophageal reflux disease without esophagitis: Secondary | ICD-10-CM | POA: Insufficient documentation

## 2012-12-29 LAB — BASIC METABOLIC PANEL
BUN: 20 mg/dL (ref 6–23)
Calcium: 9.2 mg/dL (ref 8.4–10.5)
GFR: 75.66 mL/min (ref >60.00)
Glucose, Bld: 87 mg/dL (ref 70–99)
Potassium: 4.9 mEq/L (ref 3.5–5.1)

## 2012-12-29 LAB — LIPID PANEL
Cholesterol: 153 mg/dL (ref 0–200)
HDL: 55.6 mg/dL (ref >39.00)
LDL Cholesterol: 86 mg/dL (ref 0–99)
VLDL: 11.2 mg/dL (ref 0.0–40.0)

## 2012-12-29 LAB — CBC WITH DIFFERENTIAL/PLATELET
Basophils Relative: 0.6 % (ref 0.0–3.0)
Eosinophils Relative: 2.2 % (ref 0.0–5.0)
HCT: 34.4 % — ABNORMAL LOW (ref 36.0–46.0)
Hemoglobin: 11.4 g/dL — ABNORMAL LOW (ref 12.0–15.0)
Lymphs Abs: 1.1 10*3/uL (ref 0.7–4.0)
MCV: 104.5 fl — ABNORMAL HIGH (ref 78.0–100.0)
Monocytes Absolute: 0.4 10*3/uL (ref 0.1–1.0)
Monocytes Relative: 10 % (ref 3.0–12.0)
Neutro Abs: 2.6 10*3/uL (ref 1.4–7.7)
RBC: 3.29 Mil/uL — ABNORMAL LOW (ref 3.87–5.11)
WBC: 4.2 10*3/uL — ABNORMAL LOW (ref 4.5–10.5)

## 2012-12-29 LAB — TSH: TSH: 0.64 u[IU]/mL (ref 0.35–5.50)

## 2012-12-29 LAB — HEPATIC FUNCTION PANEL
Albumin: 4.2 g/dL (ref 3.5–5.2)
Total Bilirubin: 0.7 mg/dL (ref 0.3–1.2)

## 2012-12-29 MED ORDER — CICLOPIROX 8 % EX SOLN: Freq: Every day | CUTANEOUS | Status: DC

## 2012-12-29 NOTE — Patient Instructions (Addendum)
Today we updated your med list in our EPIC system...    Continue your current medications the same...  We decided to re-start your PRILOSEC20mg - one tab twice daily taken 30 min before breakfast 7 dinner...    We also rec starting a PROBIOTIC like ALIGN daily 7 consider the Activia yogurt...  Finally we wrote for a topical nail fungus med- PENLAC to apply as directed...    Next step is referral to a Podiatrist to trim the nail etc...  Today we did a follow up CXR & FASTING blood work...    We will contact you w/ the results when avail...  Call for any questions...  Let's plan a follow up visit in 4-69months, sooner if needed for problems.Marland KitchenMarland Kitchen
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
## 2012-12-31 ENCOUNTER — Other Ambulatory Visit: Payer: Self-pay | Admitting: Pulmonary Disease

## 2012-12-31 DIAGNOSIS — R1030 Lower abdominal pain, unspecified: Secondary | ICD-10-CM

## 2012-12-31 DIAGNOSIS — R748 Abnormal levels of other serum enzymes: Secondary | ICD-10-CM

## 2012-12-31 NOTE — Progress Notes (Signed)
Quick Note:  Pt aware of results. ______ 

## 2012-12-31 NOTE — Progress Notes (Signed)
Quick Note:  Pt aware of results and aware that Abd ultrasound has been ordered; PCC's will call patient with date, time, and location. ______

## 2013-01-05 ENCOUNTER — Ambulatory Visit (HOSPITAL_COMMUNITY)
Admission: RE | Admit: 2013-01-05 | Discharge: 2013-01-05 | Disposition: A | Discharge status: Home / Self Care | Payer: Medicare Other | Source: Ambulatory Visit | Attending: Pulmonary Disease | Admitting: Pulmonary Disease

## 2013-01-05 DIAGNOSIS — R1030 Lower abdominal pain, unspecified: Secondary | ICD-10-CM

## 2013-01-05 DIAGNOSIS — R748 Abnormal levels of other serum enzymes: Secondary | ICD-10-CM | POA: Insufficient documentation

## 2013-01-05 DIAGNOSIS — N281 Cyst of kidney, acquired: Secondary | ICD-10-CM | POA: Diagnosis not present

## 2013-01-05 DIAGNOSIS — K8020 Calculus of gallbladder without cholecystitis without obstruction: Secondary | ICD-10-CM | POA: Insufficient documentation

## 2013-01-05 DIAGNOSIS — I700 Atherosclerosis of aorta: Secondary | ICD-10-CM | POA: Diagnosis not present

## 2013-01-05 DIAGNOSIS — R7989 Other specified abnormal findings of blood chemistry: Secondary | ICD-10-CM | POA: Diagnosis not present

## 2013-01-06 ENCOUNTER — Telehealth: Payer: Self-pay | Admitting: Pulmonary Disease

## 2013-01-06 NOTE — Progress Notes (Signed)
Quick Note:  Pt aware per 2.25.14 phone note. ______

## 2013-01-06 NOTE — Telephone Encounter (Signed)
Result Notes    Notes Recorded by Darrell Jewel, CMA on 01/06/2013 at 11:29 AM [email or web address]. Carron Curie, CMA  ------  Notes Recorded by Michele Mcalpine, MD on 01/06/2013 at 8:24 AM Please notify patient>  Abd Sonar showed sm gallstones but no sign of GB inflamm/cholecystitis... Tell her we will watch it over time...   Called spoke with patient, advised of abdominal US results / recs as stated by SN.  Pt verbalized her understanding and denied any questions / concerns.

## 2013-01-23 ENCOUNTER — Ambulatory Visit (INDEPENDENT_AMBULATORY_CARE_PROVIDER_SITE_OTHER): Payer: Medicare Other | Admitting: *Deleted

## 2013-01-23 DIAGNOSIS — Z01419 Encounter for gynecological examination (general) (routine) without abnormal findings: Secondary | ICD-10-CM | POA: Diagnosis not present

## 2013-01-23 DIAGNOSIS — I4891 Unspecified atrial fibrillation: Secondary | ICD-10-CM

## 2013-01-23 DIAGNOSIS — Z7901 Long term (current) use of anticoagulants: Secondary | ICD-10-CM

## 2013-01-23 DIAGNOSIS — N3941 Urge incontinence: Secondary | ICD-10-CM | POA: Diagnosis not present

## 2013-01-26 ENCOUNTER — Other Ambulatory Visit: Payer: Self-pay | Admitting: Internal Medicine

## 2013-02-03 DIAGNOSIS — I495 Sick sinus syndrome: Secondary | ICD-10-CM | POA: Diagnosis not present

## 2013-03-03 ENCOUNTER — Other Ambulatory Visit: Payer: Self-pay | Admitting: *Deleted

## 2013-03-03 MED ORDER — POTASSIUM CHLORIDE ER 10 MEQ PO TBCR: 10.0000 meq | EXTENDED_RELEASE_TABLET | Freq: Two times a day (BID) | ORAL | Status: DC

## 2013-03-03 MED ORDER — FUROSEMIDE 40 MG PO TABS: 40.0000 mg | ORAL_TABLET | Freq: Two times a day (BID) | ORAL | Status: DC

## 2013-03-03 NOTE — Telephone Encounter (Signed)
Patient daughter calls requesting 1 week supply of both Furosemide and potassium to [facility] on [address]  And [facility]. I sent both requests to both pharmacies.

## 2013-03-06 ENCOUNTER — Ambulatory Visit (INDEPENDENT_AMBULATORY_CARE_PROVIDER_SITE_OTHER): Payer: Medicare Other | Admitting: *Deleted

## 2013-03-06 DIAGNOSIS — Z7901 Long term (current) use of anticoagulants: Secondary | ICD-10-CM

## 2013-03-06 DIAGNOSIS — I4891 Unspecified atrial fibrillation: Secondary | ICD-10-CM | POA: Diagnosis not present

## 2013-03-20 DIAGNOSIS — M5137 Other intervertebral disc degeneration, lumbosacral region: Secondary | ICD-10-CM | POA: Diagnosis not present

## 2013-03-20 DIAGNOSIS — M48061 Spinal stenosis, lumbar region without neurogenic claudication: Secondary | ICD-10-CM | POA: Diagnosis not present

## 2013-03-20 DIAGNOSIS — Z79899 Other long term (current) drug therapy: Secondary | ICD-10-CM | POA: Diagnosis not present

## 2013-03-20 DIAGNOSIS — G894 Chronic pain syndrome: Secondary | ICD-10-CM | POA: Diagnosis not present

## 2013-04-21 ENCOUNTER — Ambulatory Visit (INDEPENDENT_AMBULATORY_CARE_PROVIDER_SITE_OTHER): Payer: Medicare Other | Admitting: *Deleted

## 2013-04-21 DIAGNOSIS — I4891 Unspecified atrial fibrillation: Secondary | ICD-10-CM | POA: Diagnosis not present

## 2013-04-21 DIAGNOSIS — Z7901 Long term (current) use of anticoagulants: Secondary | ICD-10-CM

## 2013-05-05 ENCOUNTER — Encounter: Payer: Self-pay | Admitting: Internal Medicine

## 2013-05-05 DIAGNOSIS — Z950 Presence of cardiac pacemaker: Secondary | ICD-10-CM | POA: Diagnosis not present

## 2013-05-05 DIAGNOSIS — I495 Sick sinus syndrome: Secondary | ICD-10-CM | POA: Diagnosis not present

## 2013-05-08 ENCOUNTER — Other Ambulatory Visit: Payer: Self-pay | Admitting: Obstetrics and Gynecology

## 2013-05-08 DIAGNOSIS — Z1231 Encounter for screening mammogram for malignant neoplasm of breast: Secondary | ICD-10-CM

## 2013-05-13 ENCOUNTER — Telehealth: Payer: Self-pay | Admitting: *Deleted

## 2013-05-13 NOTE — Telephone Encounter (Signed)
Pt would like to know if its ok to double up on furosemide and potassium . Last note states Your physician has recommended you make the following change in your medication: FUROSEMIDE 40 MG TWICE EVERY OTHER DAY ALTERNATING WITH 1 TAB EVERY OTHER DAY  FOR 10 DAYS She would like to know if its ok to still double up, if so she needs a refill on this medication sent [facility]

## 2013-05-14 ENCOUNTER — Telehealth: Payer: Self-pay | Admitting: Internal Medicine

## 2013-05-14 MED ORDER — POTASSIUM CHLORIDE ER 10 MEQ PO TBCR: EXTENDED_RELEASE_TABLET | ORAL | Status: DC

## 2013-05-14 NOTE — Telephone Encounter (Signed)
New Prob     Requesting a new prescription of KLOR-CON 10 MEQ.

## 2013-05-14 NOTE — Telephone Encounter (Signed)
Spoke with [place], refill sent to the pharm

## 2013-05-14 NOTE — Telephone Encounter (Signed)
Called and pt dicussed the furosemide directions, she is suppose to be on 1 tab po qd, pt stated that she has been gaining fluid will route this to Dr, Graciela Husbands nurse for further instructions pertaning to the edema to see if she can double up on this dose

## 2013-05-14 NOTE — Telephone Encounter (Signed)
She should only be on 40 mg one tablet by mouth daily. If she is having weight gain/ increased swelling I will need to review with Dr. Graciela Husbands.

## 2013-05-18 ENCOUNTER — Encounter: Payer: Self-pay | Admitting: Pulmonary Disease

## 2013-05-18 ENCOUNTER — Other Ambulatory Visit (INDEPENDENT_AMBULATORY_CARE_PROVIDER_SITE_OTHER): Payer: Medicare Other

## 2013-05-18 ENCOUNTER — Ambulatory Visit (INDEPENDENT_AMBULATORY_CARE_PROVIDER_SITE_OTHER): Payer: Medicare Other | Admitting: Pulmonary Disease

## 2013-05-18 VITALS — BP 128/60 | HR 70 | Temp 96.8°F | Ht 59.0 in | Wt 100.8 lb

## 2013-05-18 DIAGNOSIS — I2510 Atherosclerotic heart disease of native coronary artery without angina pectoris: Secondary | ICD-10-CM | POA: Diagnosis not present

## 2013-05-18 DIAGNOSIS — I4891 Unspecified atrial fibrillation: Secondary | ICD-10-CM | POA: Diagnosis not present

## 2013-05-18 DIAGNOSIS — M810 Age-related osteoporosis without current pathological fracture: Secondary | ICD-10-CM

## 2013-05-18 DIAGNOSIS — I5032 Chronic diastolic (congestive) heart failure: Secondary | ICD-10-CM

## 2013-05-18 DIAGNOSIS — K219 Gastro-esophageal reflux disease without esophagitis: Secondary | ICD-10-CM

## 2013-05-18 DIAGNOSIS — D649 Anemia, unspecified: Secondary | ICD-10-CM

## 2013-05-18 DIAGNOSIS — E7800 Pure hypercholesterolemia, unspecified: Secondary | ICD-10-CM

## 2013-05-18 DIAGNOSIS — I442 Atrioventricular block, complete: Secondary | ICD-10-CM

## 2013-05-18 DIAGNOSIS — N318 Other neuromuscular dysfunction of bladder: Secondary | ICD-10-CM

## 2013-05-18 DIAGNOSIS — K5730 Diverticulosis of large intestine without perforation or abscess without bleeding: Secondary | ICD-10-CM

## 2013-05-18 DIAGNOSIS — M545 Low back pain: Secondary | ICD-10-CM

## 2013-05-18 DIAGNOSIS — M1990 Unspecified osteoarthritis, unspecified site: Secondary | ICD-10-CM

## 2013-05-18 DIAGNOSIS — I872 Venous insufficiency (chronic) (peripheral): Secondary | ICD-10-CM

## 2013-05-18 DIAGNOSIS — Z950 Presence of cardiac pacemaker: Secondary | ICD-10-CM

## 2013-05-18 LAB — CBC WITH DIFFERENTIAL/PLATELET
Basophils Absolute: 0 10*3/uL (ref 0.0–0.1)
Eosinophils Relative: 2.3 % (ref 0.0–5.0)
Lymphs Abs: 1.3 10*3/uL (ref 0.7–4.0)
MCV: 109.3 fl — ABNORMAL HIGH (ref 78.0–100.0)
Monocytes Absolute: 0.4 10*3/uL (ref 0.1–1.0)
Monocytes Relative: 9.9 % (ref 3.0–12.0)
Neutrophils Relative %: 57.9 % (ref 43.0–77.0)
Platelets: 122 10*3/uL — ABNORMAL LOW (ref 150.0–400.0)
RDW: 14.2 % (ref 11.5–14.6)
WBC: 4.3 10*3/uL — ABNORMAL LOW (ref 4.5–10.5)

## 2013-05-18 LAB — BASIC METABOLIC PANEL
BUN: 15 mg/dL (ref 6–23)
Creatinine, Ser: 0.7 mg/dL (ref 0.4–1.2)
GFR: 81.77 mL/min (ref >60.00)
Glucose, Bld: 94 mg/dL (ref 70–99)
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
## 2013-05-18 NOTE — Patient Instructions (Addendum)
Today we updated your med list in our EPIC system...    Continue your current medications the same...  Today we did your follow up blood work...    We will contact you w/ the results when available...   Call for any questions...  Let's plan a follow up visit in 6mo, sooner if needed for problems...   

## 2013-05-20 ENCOUNTER — Other Ambulatory Visit: Payer: Self-pay | Admitting: *Deleted

## 2013-05-20 MED ORDER — POTASSIUM CHLORIDE ER 10 MEQ PO TBCR: EXTENDED_RELEASE_TABLET | ORAL | Status: DC

## 2013-05-20 NOTE — Telephone Encounter (Signed)
Pt daughter call requesting pt K+. Daughter states pt was going out town 05-28-2013 and mail order was not going to get her meds to her in time. So she would like office to call it in to local pharmacy. Called mail order and they stated pt should have her meds by 05-22-2013. Called pt daughter back to inform her of this and called local pharmacy to cancel order that was placed per pt daughter.

## 2013-05-20 NOTE — Progress Notes (Signed)
Quick Note:  Pt aware of results. ______ 

## 2013-05-21 NOTE — Telephone Encounter (Signed)
Will forward to Dr. Klein to review. 

## 2013-05-24 NOTE — Telephone Encounter (Signed)
Ok to double up when retaining fluid thanks

## 2013-05-25 DIAGNOSIS — H903 Sensorineural hearing loss, bilateral: Secondary | ICD-10-CM | POA: Diagnosis not present

## 2013-05-25 DIAGNOSIS — J343 Hypertrophy of nasal turbinates: Secondary | ICD-10-CM | POA: Diagnosis not present

## 2013-05-25 DIAGNOSIS — H6980 Other specified disorders of Eustachian tube, unspecified ear: Secondary | ICD-10-CM | POA: Diagnosis not present

## 2013-05-25 DIAGNOSIS — J310 Chronic rhinitis: Secondary | ICD-10-CM | POA: Diagnosis not present

## 2013-05-26 ENCOUNTER — Ambulatory Visit (INDEPENDENT_AMBULATORY_CARE_PROVIDER_SITE_OTHER): Payer: Medicare Other | Admitting: *Deleted

## 2013-05-26 DIAGNOSIS — I4891 Unspecified atrial fibrillation: Secondary | ICD-10-CM | POA: Diagnosis not present

## 2013-05-26 DIAGNOSIS — Z7901 Long term (current) use of anticoagulants: Secondary | ICD-10-CM | POA: Diagnosis not present

## 2013-05-26 LAB — POCT INR: INR: 2.2

## 2013-06-19 ENCOUNTER — Ambulatory Visit
Admission: RE | Admit: 2013-06-19 | Discharge: 2013-06-19 | Disposition: A | Discharge status: Home / Self Care | Payer: Medicare Other | Source: Ambulatory Visit | Attending: Obstetrics and Gynecology | Admitting: Obstetrics and Gynecology

## 2013-06-19 DIAGNOSIS — Z1231 Encounter for screening mammogram for malignant neoplasm of breast: Secondary | ICD-10-CM | POA: Diagnosis not present

## 2013-06-29 DIAGNOSIS — J310 Chronic rhinitis: Secondary | ICD-10-CM | POA: Diagnosis not present

## 2013-06-29 DIAGNOSIS — J343 Hypertrophy of nasal turbinates: Secondary | ICD-10-CM | POA: Diagnosis not present

## 2013-07-06 ENCOUNTER — Other Ambulatory Visit: Payer: Self-pay | Admitting: Internal Medicine

## 2013-07-10 ENCOUNTER — Ambulatory Visit (INDEPENDENT_AMBULATORY_CARE_PROVIDER_SITE_OTHER): Payer: Medicare Other

## 2013-07-10 DIAGNOSIS — I4891 Unspecified atrial fibrillation: Secondary | ICD-10-CM | POA: Diagnosis not present

## 2013-07-10 DIAGNOSIS — Z7901 Long term (current) use of anticoagulants: Secondary | ICD-10-CM

## 2013-07-10 LAB — POCT INR: INR: 2.6

## 2013-08-04 ENCOUNTER — Encounter: Payer: Self-pay | Admitting: Internal Medicine

## 2013-08-04 DIAGNOSIS — I495 Sick sinus syndrome: Secondary | ICD-10-CM | POA: Diagnosis not present

## 2013-08-04 DIAGNOSIS — Z950 Presence of cardiac pacemaker: Secondary | ICD-10-CM | POA: Diagnosis not present

## 2013-08-21 ENCOUNTER — Ambulatory Visit (INDEPENDENT_AMBULATORY_CARE_PROVIDER_SITE_OTHER): Payer: Medicare Other | Admitting: *Deleted

## 2013-08-21 DIAGNOSIS — I4891 Unspecified atrial fibrillation: Secondary | ICD-10-CM | POA: Diagnosis not present

## 2013-08-21 DIAGNOSIS — Z7901 Long term (current) use of anticoagulants: Secondary | ICD-10-CM

## 2013-08-21 LAB — POCT INR: INR: 2

## 2013-09-02 DIAGNOSIS — L819 Disorder of pigmentation, unspecified: Secondary | ICD-10-CM | POA: Diagnosis not present

## 2013-09-02 DIAGNOSIS — B351 Tinea unguium: Secondary | ICD-10-CM | POA: Diagnosis not present

## 2013-09-02 DIAGNOSIS — L219 Seborrheic dermatitis, unspecified: Secondary | ICD-10-CM | POA: Diagnosis not present

## 2013-09-02 DIAGNOSIS — L259 Unspecified contact dermatitis, unspecified cause: Secondary | ICD-10-CM | POA: Diagnosis not present

## 2013-09-02 DIAGNOSIS — L570 Actinic keratosis: Secondary | ICD-10-CM | POA: Diagnosis not present

## 2013-09-02 DIAGNOSIS — L821 Other seborrheic keratosis: Secondary | ICD-10-CM | POA: Diagnosis not present

## 2013-10-02 ENCOUNTER — Ambulatory Visit (INDEPENDENT_AMBULATORY_CARE_PROVIDER_SITE_OTHER): Payer: Medicare Other | Admitting: Pharmacist

## 2013-10-02 DIAGNOSIS — I4891 Unspecified atrial fibrillation: Secondary | ICD-10-CM

## 2013-10-02 DIAGNOSIS — Z23 Encounter for immunization: Secondary | ICD-10-CM

## 2013-10-02 DIAGNOSIS — Z7901 Long term (current) use of anticoagulants: Secondary | ICD-10-CM

## 2013-10-02 LAB — POCT INR: INR: 2.1

## 2013-10-23 ENCOUNTER — Emergency Department (HOSPITAL_COMMUNITY): Payer: Medicare Other

## 2013-10-23 ENCOUNTER — Inpatient Hospital Stay (HOSPITAL_COMMUNITY)
Admission: EM | Admit: 2013-10-23 | Discharge: 2013-10-25 | DRG: 536 | Disposition: A | Discharge status: Home Health | Payer: Medicare Other | Attending: Family Medicine | Admitting: Family Medicine

## 2013-10-23 ENCOUNTER — Encounter (HOSPITAL_COMMUNITY): Payer: Self-pay | Admitting: Emergency Medicine

## 2013-10-23 DIAGNOSIS — G894 Chronic pain syndrome: Secondary | ICD-10-CM

## 2013-10-23 DIAGNOSIS — Z803 Family history of malignant neoplasm of breast: Secondary | ICD-10-CM

## 2013-10-23 DIAGNOSIS — S32509B Unspecified fracture of unspecified pubis, initial encounter for open fracture: Secondary | ICD-10-CM | POA: Diagnosis not present

## 2013-10-23 DIAGNOSIS — M25559 Pain in unspecified hip: Secondary | ICD-10-CM | POA: Diagnosis not present

## 2013-10-23 DIAGNOSIS — M1990 Unspecified osteoarthritis, unspecified site: Secondary | ICD-10-CM

## 2013-10-23 DIAGNOSIS — M7918 Myalgia, other site: Secondary | ICD-10-CM

## 2013-10-23 DIAGNOSIS — B351 Tinea unguium: Secondary | ICD-10-CM

## 2013-10-23 DIAGNOSIS — S32592B Other specified fracture of left pubis, initial encounter for open fracture: Secondary | ICD-10-CM

## 2013-10-23 DIAGNOSIS — Q2111 Secundum atrial septal defect: Secondary | ICD-10-CM

## 2013-10-23 DIAGNOSIS — Z950 Presence of cardiac pacemaker: Secondary | ICD-10-CM | POA: Diagnosis not present

## 2013-10-23 DIAGNOSIS — Z7901 Long term (current) use of anticoagulants: Secondary | ICD-10-CM

## 2013-10-23 DIAGNOSIS — I2510 Atherosclerotic heart disease of native coronary artery without angina pectoris: Secondary | ICD-10-CM

## 2013-10-23 DIAGNOSIS — D649 Anemia, unspecified: Secondary | ICD-10-CM | POA: Diagnosis not present

## 2013-10-23 DIAGNOSIS — E871 Hypo-osmolality and hyponatremia: Secondary | ICD-10-CM | POA: Diagnosis present

## 2013-10-23 DIAGNOSIS — Z8249 Family history of ischemic heart disease and other diseases of the circulatory system: Secondary | ICD-10-CM

## 2013-10-23 DIAGNOSIS — M810 Age-related osteoporosis without current pathological fracture: Secondary | ICD-10-CM | POA: Diagnosis present

## 2013-10-23 DIAGNOSIS — K219 Gastro-esophageal reflux disease without esophagitis: Secondary | ICD-10-CM | POA: Diagnosis present

## 2013-10-23 DIAGNOSIS — M545 Low back pain, unspecified: Secondary | ICD-10-CM

## 2013-10-23 DIAGNOSIS — E7800 Pure hypercholesterolemia, unspecified: Secondary | ICD-10-CM

## 2013-10-23 DIAGNOSIS — J989 Respiratory disorder, unspecified: Secondary | ICD-10-CM

## 2013-10-23 DIAGNOSIS — Z79899 Other long term (current) drug therapy: Secondary | ICD-10-CM

## 2013-10-23 DIAGNOSIS — Z8719 Personal history of other diseases of the digestive system: Secondary | ICD-10-CM

## 2013-10-23 DIAGNOSIS — I4891 Unspecified atrial fibrillation: Secondary | ICD-10-CM

## 2013-10-23 DIAGNOSIS — N318 Other neuromuscular dysfunction of bladder: Secondary | ICD-10-CM

## 2013-10-23 DIAGNOSIS — K5730 Diverticulosis of large intestine without perforation or abscess without bleeding: Secondary | ICD-10-CM

## 2013-10-23 DIAGNOSIS — I442 Atrioventricular block, complete: Secondary | ICD-10-CM

## 2013-10-23 DIAGNOSIS — Q211 Atrial septal defect: Secondary | ICD-10-CM

## 2013-10-23 DIAGNOSIS — S32509A Unspecified fracture of unspecified pubis, initial encounter for closed fracture: Secondary | ICD-10-CM | POA: Diagnosis not present

## 2013-10-23 DIAGNOSIS — I5032 Chronic diastolic (congestive) heart failure: Secondary | ICD-10-CM | POA: Diagnosis not present

## 2013-10-23 DIAGNOSIS — I6529 Occlusion and stenosis of unspecified carotid artery: Secondary | ICD-10-CM | POA: Diagnosis present

## 2013-10-23 DIAGNOSIS — S32592S Other specified fracture of left pubis, sequela: Secondary | ICD-10-CM

## 2013-10-23 DIAGNOSIS — IMO0002 Reserved for concepts with insufficient information to code with codable children: Secondary | ICD-10-CM | POA: Diagnosis not present

## 2013-10-23 DIAGNOSIS — S79919A Unspecified injury of unspecified hip, initial encounter: Secondary | ICD-10-CM | POA: Diagnosis not present

## 2013-10-23 DIAGNOSIS — S32599A Other specified fracture of unspecified pubis, initial encounter for closed fracture: Secondary | ICD-10-CM

## 2013-10-23 DIAGNOSIS — I872 Venous insufficiency (chronic) (peripheral): Secondary | ICD-10-CM

## 2013-10-23 DIAGNOSIS — W19XXXA Unspecified fall, initial encounter: Secondary | ICD-10-CM | POA: Diagnosis present

## 2013-10-23 DIAGNOSIS — I639 Cerebral infarction, unspecified: Secondary | ICD-10-CM

## 2013-10-23 LAB — CBC WITH DIFFERENTIAL/PLATELET
Eosinophils Absolute: 0.1 10*3/uL (ref 0.0–0.7)
Eosinophils Relative: 1 % (ref 0–5)
HCT: 30.8 % — ABNORMAL LOW (ref 36.0–46.0)
Lymphs Abs: 1.1 10*3/uL (ref 0.7–4.0)
MCH: 36.3 pg — ABNORMAL HIGH (ref 26.0–34.0)
MCHC: 34.7 g/dL (ref 30.0–36.0)
MCV: 104.4 fL — ABNORMAL HIGH (ref 78.0–100.0)
Monocytes Absolute: 0.5 10*3/uL (ref 0.1–1.0)
Monocytes Relative: 7 % (ref 3–12)
Neutro Abs: 5.1 10*3/uL (ref 1.7–7.7)
Neutrophils Relative %: 76 % (ref 43–77)
RDW: 13 % (ref 11.5–15.5)
WBC: 6.8 10*3/uL (ref 4.0–10.5)

## 2013-10-23 LAB — BASIC METABOLIC PANEL
BUN: 19 mg/dL (ref 6–23)
Calcium: 9.7 mg/dL (ref 8.4–10.5)
Chloride: 94 mEq/L — ABNORMAL LOW (ref 96–112)
Glucose, Bld: 97 mg/dL (ref 70–99)
Potassium: 4.3 mEq/L (ref 3.5–5.1)

## 2013-10-23 LAB — URINALYSIS, ROUTINE W REFLEX MICROSCOPIC
Bilirubin Urine: NEGATIVE
Glucose, UA: NEGATIVE mg/dL
Specific Gravity, Urine: 1.012 (ref 1.005–1.030)
Urobilinogen, UA: 0.2 mg/dL (ref 0.0–1.0)
pH: 6 (ref 5.0–8.0)

## 2013-10-23 LAB — URINE MICROSCOPIC-ADD ON

## 2013-10-23 MED ORDER — FLUTICASONE PROPIONATE 50 MCG/ACT NA SUSP: 2.0000 | Freq: Every day | NASAL | Status: DC | PRN

## 2013-10-23 MED ORDER — ALENDRONATE SODIUM 70 MG PO TABS: 70.0000 mg | ORAL_TABLET | ORAL | Status: DC

## 2013-10-23 MED ORDER — SODIUM CHLORIDE 0.9 % IV SOLN: 250.0000 mL | INTRAVENOUS | Status: DC | PRN

## 2013-10-23 MED ORDER — OMEGA-3-ACID ETHYL ESTERS 1 G PO CAPS
1.0000 g | ORAL_CAPSULE | Freq: Every day | ORAL | Status: DC
  Administered 2013-10-23: 22:00:00 1 g via ORAL
  Filled 2013-10-23 (×3): qty 1

## 2013-10-23 MED ORDER — TRAMADOL HCL 50 MG PO TABS
50.0000 mg | ORAL_TABLET | Freq: Four times a day (QID) | ORAL | Status: DC | PRN
  Administered 2013-10-23 – 2013-10-25 (×6): 50 mg via ORAL
  Filled 2013-10-23 (×6): qty 1

## 2013-10-23 MED ORDER — WARFARIN - PHARMACIST DOSING INPATIENT: Freq: Every day | Status: DC

## 2013-10-23 MED ORDER — VITAMIN D 1000 UNITS PO CAPS: 1000.0000 [IU] | ORAL_CAPSULE | Freq: Every day | ORAL | Status: DC

## 2013-10-23 MED ORDER — FUROSEMIDE 40 MG PO TABS
40.0000 mg | ORAL_TABLET | Freq: Two times a day (BID) | ORAL | Status: DC
  Administered 2013-10-24 – 2013-10-25 (×3): 40 mg via ORAL
  Filled 2013-10-23 (×6): qty 1

## 2013-10-23 MED ORDER — ACETAMINOPHEN 325 MG PO TABS
650.0000 mg | ORAL_TABLET | Freq: Three times a day (TID) | ORAL | Status: DC | PRN
  Administered 2013-10-24 – 2013-10-25 (×5): 650 mg via ORAL
  Filled 2013-10-23 (×5): qty 2

## 2013-10-23 MED ORDER — WARFARIN SODIUM 5 MG PO TABS
5.0000 mg | ORAL_TABLET | Freq: Once | ORAL | Status: AC
  Administered 2013-10-23: 22:00:00 5 mg via ORAL
  Filled 2013-10-23: qty 1

## 2013-10-23 MED ORDER — VITAMIN D3 25 MCG (1000 UNIT) PO TABS
1000.0000 [IU] | ORAL_TABLET | Freq: Every day | ORAL | Status: DC
  Administered 2013-10-24 – 2013-10-25 (×2): 1000 [IU] via ORAL
  Filled 2013-10-23 (×2): qty 1

## 2013-10-23 MED ORDER — CALCIUM CARBONATE-VITAMIN D 500-200 MG-UNIT PO TABS
2.0000 | ORAL_TABLET | Freq: Every day | ORAL | Status: DC
  Administered 2013-10-24 – 2013-10-25 (×2): 2 via ORAL
  Filled 2013-10-23 (×3): qty 2

## 2013-10-23 MED ORDER — CALCIUM CARBONATE-VITAMIN D 600-400 MG-UNIT PO TABS: 2.0000 | ORAL_TABLET | Freq: Every day | ORAL | Status: DC

## 2013-10-23 MED ORDER — ONDANSETRON HCL 4 MG/2ML IJ SOLN: 4.0000 mg | Freq: Four times a day (QID) | INTRAMUSCULAR | Status: DC | PRN

## 2013-10-23 MED ORDER — FERROUS SULFATE 325 (65 FE) MG PO TABS
325.0000 mg | ORAL_TABLET | Freq: Every day | ORAL | Status: DC
  Administered 2013-10-24 – 2013-10-25 (×2): 325 mg via ORAL
  Filled 2013-10-23 (×3): qty 1

## 2013-10-23 MED ORDER — OMEGA-3 350 MG PO CAPS: 1.0000 | ORAL_CAPSULE | Freq: Every evening | ORAL | Status: DC

## 2013-10-23 MED ORDER — FESOTERODINE FUMARATE ER 4 MG PO TB24
4.0000 mg | ORAL_TABLET | Freq: Every day | ORAL | Status: DC
  Administered 2013-10-24 – 2013-10-25 (×2): 4 mg via ORAL
  Filled 2013-10-23 (×3): qty 1

## 2013-10-23 MED ORDER — SODIUM CHLORIDE 0.9 % IJ SOLN
3.0000 mL | Freq: Two times a day (BID) | INTRAMUSCULAR | Status: DC
  Administered 2013-10-23 – 2013-10-24 (×2): 3 mL via INTRAVENOUS

## 2013-10-23 MED ORDER — SODIUM CHLORIDE 0.9 % IJ SOLN: 3.0000 mL | INTRAMUSCULAR | Status: DC | PRN

## 2013-10-23 MED ORDER — POTASSIUM CHLORIDE ER 10 MEQ PO TBCR
10.0000 meq | EXTENDED_RELEASE_TABLET | Freq: Two times a day (BID) | ORAL | Status: DC
Start: 2013-10-23 — End: 2013-10-25
  Administered 2013-10-23 – 2013-10-25 (×3): 10 meq via ORAL
  Filled 2013-10-23 (×5): qty 1

## 2013-10-23 MED ORDER — PANTOPRAZOLE SODIUM 40 MG PO TBEC
40.0000 mg | DELAYED_RELEASE_TABLET | Freq: Every day | ORAL | Status: DC
  Administered 2013-10-23 – 2013-10-25 (×3): 40 mg via ORAL
  Filled 2013-10-23 (×4): qty 1

## 2013-10-23 MED ORDER — ONDANSETRON HCL 4 MG PO TABS: 4.0000 mg | ORAL_TABLET | Freq: Four times a day (QID) | ORAL | Status: DC | PRN

## 2013-10-23 NOTE — Progress Notes (Signed)
ANTICOAGULATION CONSULT NOTE - Initial Consult  Pharmacy Consult for Warfarin Indication: atrial fibrillation  Allergies  Allergen Reactions  . Amiodarone Hcl     REACTION: INTOL to Amiodarone in past  . Codeine Nausea And Vomiting  . Fentanyl     REACTION: causes nausea--dizziness  . Lamisil [Terbinafine Hcl]     Causes nausea    Patient Measurements: Height: 4\' 10"  (147.3 cm) Weight: 90 lb (40.824 kg) IBW/kg (Calculated) : 40.9   Vital Signs: Temp: 98.5 F (36.9 C) (12/12 1645) Temp src: Oral (12/12 1645) BP: 164/68 mmHg (12/12 1645) Pulse Rate: 69 (12/12 1645)  Labs:  Recent Labs  10/23/13 1420 10/23/13 1818  HGB 10.7*  --   HCT 30.8*  --   PLT 117*  --   LABPROT  --  24.3*  INR  --  2.27*  CREATININE 0.57  --     Estimated Creatinine Clearance: 28.9 ml/min (by C-G formula based on Cr of 0.57).   Medical History: Past Medical History  Diagnosis Date  . Pneumonia, organism unspecified   . Unspecified disease of respiratory system   . CAD (coronary artery disease)   . Ostium secundum type atrial septal defect   . AV block   . Presence of permanent cardiac pacemaker   . Venous insufficiency   . Pure hypercholesterolemia   . Diverticulosis of colon (without mention of hemorrhage)   . Overactive bladder   . DJD (degenerative joint disease)   . LBP (low back pain)   . Chronic pain syndrome   . Osteoporosis   . Anemia     Medications:  Scheduled:  . [START ON 10/24/2013] calcium-vitamin D  2 tablet Oral Q breakfast  . [START ON 10/24/2013] cholecalciferol  1,000 Units Oral Daily  . [START ON 10/24/2013] ferrous sulfate  325 mg Oral Q breakfast  . [START ON 10/24/2013] fesoterodine  4 mg Oral Daily  . furosemide  40 mg Oral BID  . omega-3 acid ethyl esters  1 g Oral QHS  . pantoprazole  40 mg Oral Daily  . potassium chloride  10 mEq Oral BID  . sodium chloride  3 mL Intravenous Q12H  . warfarin  5 mg Oral Once  . [START ON 10/24/2013] Warfarin  - Pharmacist Dosing Inpatient   Does not apply q1800   Infusions:    Assessment: 79 yo with hx A-fib w/ permanent pacemaker, DJD, osteoporosis, hx diastolic heart failure presents to ER 12/12 s/p mechanical fall. Pt on chronic warfarin for A-fib.  Home dose= 5mg  daily except 2.5mg  Sun/Thur.  LD 12/11. Goal of Therapy:  INR 2-3    Plan:   Warfarin 5mg  x1 tonight  Daily PT/INR  Education  Susanne Greenhouse R 10/23/2013,8:10 PM

## 2013-10-23 NOTE — ED Notes (Signed)
Bed: WA16 Expected date:  Expected time:  Means of arrival:  Comments: EMS fall 

## 2013-10-23 NOTE — Progress Notes (Signed)
PHARMACIST - PHYSICIAN COMMUNICATION  CONCERNING: P&T Medication Policy Regarding Oral Bisphosphonates  RECOMMENDATION: Your order for alendronate (Fosamax), ibandronate (Boniva), or risedronate (Actonel) has been discontinued at this time.  If the patient's post-hospital medical condition warrants safe use of this class of drugs, please resume the pre-hospital regimen upon discharge.  DESCRIPTION:  Alendronate (Fosamax), ibandronate (Boniva), and risedronate (Actonel) can cause severe esophageal erosions in patients who are unable to remain upright at least 30 minutes after taking this medication.   Since brief interruptions in therapy are thought to have minimal impact on bone mineral density, the Pharmacy & Therapeutics Committee has established that bisphosphonate orders should be routinely discontinued during hospitalization.   To override this safety policy and permit administration of Boniva, Fosamax, or Actonel in the hospital, prescribers must write "DO NOT HOLD" in the comments section when placing the order for this class of medications.  Juliette Alcide, PharmD, BCPS.   Pager: [phone] 10/23/2013 5:47 PM

## 2013-10-23 NOTE — ED Provider Notes (Signed)
CSN: [id number]     Arrival date & time 10/23/13  1137 History   First MD Initiated Contact with Patient 10/23/13 1209     Chief Complaint  Patient presents with  . Fall  . Hip Pain   (Consider location/radiation/quality/duration/timing/severity/associated sxs/prior Treatment) Patient is a 79 y.o. female presenting with fall and hip pain. The history is provided by the patient.  Fall  Hip Pain   patient here complaining of left hip pain after a fall yesterday. Patient says she tripped and there was no loss of consciousness. Pain characterized as sharp and worse with standing. She does occasionally use a walker but cannot walk this morning. Denies any lower back pain. No change in bowel or bladder function. No treatment used prior to arrival. No prior history of hip surgery. Denies any urinary symptoms. EMS was called and patient transported here  Past Medical History  Diagnosis Date  . Pneumonia, organism unspecified   . Unspecified disease of respiratory system   . CAD (coronary artery disease)   . Ostium secundum type atrial septal defect   . AV block   . Presence of permanent cardiac pacemaker   . Venous insufficiency   . Pure hypercholesterolemia   . Diverticulosis of colon (without mention of hemorrhage)   . Overactive bladder   . DJD (degenerative joint disease)   . LBP (low back pain)   . Chronic pain syndrome   . Osteoporosis   . Anemia    Past Surgical History  Procedure Laterality Date  . Vesicovaginal fistula closure w/ tah  1973  . Cataract extraction    . Asd repair    . Pacemaker placement  1996    St Jude  . Inguinal hernia repair  2011    bilaterally  . Shoulder surgery      x 2  . Ganglion cyst excision    . Partial hysterectomy     Family History  Problem Relation Age of Onset  . Heart disease Mother   . Breast cancer Sister   . Emphysema Father    History  Substance Use Topics  . Smoking status: Never Smoker   . Smokeless tobacco: Never  Used  . Alcohol Use: No     Comment: social use   OB History   Grav Para Term Preterm Abortions TAB SAB Ect Mult Living                 Review of Systems  All other systems reviewed and are negative.    Allergies  Amiodarone hcl; Codeine; Fentanyl; and Lamisil  Home Medications   Current Outpatient Rx  Name  Route  Sig  Dispense  Refill  . acetaminophen (TYLENOL) 500 MG tablet   Oral   Take 650 mg by mouth every 8 (eight) hours as needed. Take with tramadol as needed for pain         . alendronate (FOSAMAX) 70 MG tablet   Oral   Take 70 mg by mouth every 7 (seven) days. Take with a full glass of water on an empty stomach.          . Ascorbic Acid (VITAMIN C) 1000 MG tablet   Oral   Take 1,000 mg by mouth daily.           . Biotin 1000 MCG tablet   Oral   Take 1,000 mcg by mouth daily.           . Calcium Carbonate-Vitamin D (CALCIUM 600+D) 600-400  MG-UNIT per tablet   Oral   Take 2 tablets by mouth daily.           . Cholecalciferol (VITAMIN D) 1000 UNITS capsule   Oral   Take 1,000 Units by mouth daily.           . ferrous sulfate 325 (65 FE) MG tablet   Oral   Take 325 mg by mouth daily with breakfast.          . fluticasone (FLONASE) 50 MCG/ACT nasal spray   Nasal   Place 2 sprays into the nose daily as needed for allergies.          . furosemide (LASIX) 40 MG tablet   Oral   Take 1 tablet (40 mg total) by mouth 2 (two) times daily.   14 tablet   0     [facility] by next  ...   . Glucosamine 500 MG CAPS   Oral   Take 500 mg by mouth daily.          . Omega-3 350 MG CAPS   Oral   Take 1 capsule by mouth every evening.          Marland Kitchen omeprazole (PRILOSEC) 20 MG capsule   Oral   Take 20 mg by mouth daily as needed (acid reflux).          . potassium chloride (K-DUR) 10 MEQ tablet   Oral   Take 10 mEq by mouth 2 (two) times daily.         Marland Kitchen tolterodine (DETROL LA) 2 MG 24 hr capsule    Oral   Take 2 mg by mouth 2 (two) times daily.           . traMADol (ULTRAM) 50 MG tablet   Oral   Take 50 mg by mouth every 6 (six) hours as needed for moderate pain.          . vitamin A 8000 UNIT capsule   Oral   Take 8,000 Units by mouth every other day.           . vitamin E 400 UNIT capsule   Oral   Take 400 Units by mouth daily.           Marland Kitchen warfarin (COUMADIN) 5 MG tablet   Oral   Take 2.5-5 mg by mouth every evening. On Sunday and Thursday take a half a tablet (2.5mg ).  On Monday, Tuesday, Wednesday, Friday, and Saturday take a whole 5mg  tablet.          BP 122/40  Pulse 74  Temp(Src) 98.8 F (37.1 C) (Oral)  Resp 20  SpO2 98% Physical Exam  Nursing note and vitals reviewed. Constitutional: She is oriented to person, place, and time. She appears well-developed and well-nourished.  Non-toxic appearance. No distress.  HENT:  Head: Normocephalic and atraumatic.  Eyes: Conjunctivae, EOM and lids are normal. Pupils are equal, round, and reactive to light.  Neck: Normal range of motion. Neck supple. No tracheal deviation present. No mass present.  Cardiovascular: Normal rate, regular rhythm and normal heart sounds.  Exam reveals no gallop.   No murmur heard. Pulmonary/Chest: Effort normal and breath sounds normal. No stridor. No respiratory distress. She has no decreased breath sounds. She has no wheezes. She has no rhonchi. She has no rales.  Abdominal: Soft. Normal appearance and bowel sounds are normal. She exhibits no distension. There is no tenderness. There is no rebound  and no CVA tenderness.  Musculoskeletal: Normal range of motion. She exhibits no edema and no tenderness.  Left hip without shortening or rotation. Pain with flexion and extension. Pain at proximal left femur.  Neurological: She is alert and oriented to person, place, and time. She has normal strength. No cranial nerve deficit or sensory deficit. GCS eye subscore is 4. GCS verbal subscore is  5. GCS motor subscore is 6.  Skin: Skin is warm and dry. No abrasion and no rash noted.  Psychiatric: She has a normal mood and affect. Her speech is normal and behavior is normal.    ED Course  Procedures (including critical care time) Labs Review Labs Reviewed - No data to display Imaging Review No results found.  EKG Interpretation   None       MDM  No diagnosis found. Patient to be admitted for treatment of her pubic rami fracture    Toy Baker, MD 10/23/13 [phone]

## 2013-10-23 NOTE — ED Notes (Addendum)
Pt from home with c/o of fall last night from tripping over rug. Woke up this am unable to bear weight on left hip. Pain 10/10 upon movement. Pt on Coumadin for A-fib.

## 2013-10-23 NOTE — Plan of Care (Signed)
Problem: Phase I Progression Outcomes Goal: Voiding-avoid urinary catheter unless indicated Outcome: Completed/Met Date Met:  10/23/13 Uses bedpan

## 2013-10-23 NOTE — H&P (Signed)
[facility] History and Physical  [name] FAO:[id number] DOB: June 14, 1919 DOA: 10/23/2013  Referring physician: Dr. Freida Busman PCP: Michele Mcalpine, MD   Chief Complaint: Hip pain  HPI: [name] is a 79 y.o. female  With history of atrial fibrillation on Coumadin with presence of permanent pacemaker, degenerative joint disease, osteoporosis, and history of diastolic heart failure. Who presented to the hospital after mechanical fall last night. Woke up this morning and had persistent discomfort at the left hip which was worse when trying to stand. Given persistence of symptoms since onset it was decided to have patient evaluated in the emergency department. Otherwise no other complaints.  While in the ED patient had x-ray of hip which showed pubic rami fracture and subsequently we were consulted for further admission evaluation and recommendations.   Review of Systems:  Constitutional:  No weight loss, night sweats, Fevers, chills, fatigue.  HEENT:  No headaches, Difficulty swallowing,Tooth/dental problems,Sore throat,  No sneezing, itching, ear ache, nasal congestion, post nasal drip,  Cardio-vascular:  No chest pain, Orthopnea, PND, swelling in lower extremities, anasarca, dizziness, palpitations  GI:  No heartburn, indigestion, abdominal pain, nausea, vomiting, diarrhea, change in bowel habits, loss of appetite  Resp:  No shortness of breath with exertion or at rest. No excess mucus, no productive cough, No non-productive cough, No coughing up of blood.No change in color of mucus.No wheezing.No chest wall deformity  Skin:  no rash or lesions.  GU:  no dysuria, change in color of urine, no urgency or frequency. No flank pain.  Musculoskeletal:  No joint pain or swelling. No decreased range of motion. No back pain.  Psych:  No change in mood or affect. No depression or anxiety. No memory loss.   Past Medical History  Diagnosis Date  . Pneumonia, organism unspecified    . Unspecified disease of respiratory system   . CAD (coronary artery disease)   . Ostium secundum type atrial septal defect   . AV block   . Presence of permanent cardiac pacemaker   . Venous insufficiency   . Pure hypercholesterolemia   . Diverticulosis of colon (without mention of hemorrhage)   . Overactive bladder   . DJD (degenerative joint disease)   . LBP (low back pain)   . Chronic pain syndrome   . Osteoporosis   . Anemia    Past Surgical History  Procedure Laterality Date  . Vesicovaginal fistula closure w/ tah  1973  . Cataract extraction    . Asd repair    . Pacemaker placement  1996    St Jude  . Inguinal hernia repair  2011    bilaterally  . Shoulder surgery      x 2  . Ganglion cyst excision    . Partial hysterectomy     Social History:  reports that she has never smoked. She has never used smokeless tobacco. She reports that she does not drink alcohol or use illicit drugs.  Allergies  Allergen Reactions  . Amiodarone Hcl     REACTION: INTOL to Amiodarone in past  . Codeine Nausea And Vomiting  . Fentanyl     REACTION: causes nausea--dizziness  . Lamisil [Terbinafine Hcl]     Causes nausea    Family History  Problem Relation Age of Onset  . Heart disease Mother   . Breast cancer Sister   . Emphysema Father      Prior to Admission medications   Medication Sig Start Date End Date Taking? Authorizing  Provider  acetaminophen (TYLENOL) 500 MG tablet Take 650 mg by mouth every 8 (eight) hours as needed. Take with tramadol as needed for pain   Yes Historical Provider, MD  alendronate (FOSAMAX) 70 MG tablet Take 70 mg by mouth every 7 (seven) days. Take with a full glass of water on an empty stomach.    Yes Historical Provider, MD  Ascorbic Acid (VITAMIN C) 1000 MG tablet Take 1,000 mg by mouth daily.     Yes Historical Provider, MD  Biotin 1000 MCG tablet Take 1,000 mcg by mouth daily.     Yes Historical Provider, MD  Calcium Carbonate-Vitamin D  (CALCIUM 600+D) 600-400 MG-UNIT per tablet Take 2 tablets by mouth daily.     Yes Historical Provider, MD  Cholecalciferol (VITAMIN D) 1000 UNITS capsule Take 1,000 Units by mouth daily.     Yes Historical Provider, MD  ferrous sulfate 325 (65 FE) MG tablet Take 325 mg by mouth daily with breakfast.    Yes Historical Provider, MD  fluticasone (FLONASE) 50 MCG/ACT nasal spray Place 2 sprays into the nose daily as needed for allergies.    Yes Historical Provider, MD  furosemide (LASIX) 40 MG tablet Take 1 tablet (40 mg total) by mouth 2 (two) times daily. 03/03/13  Yes Duke Salvia, MD  Glucosamine 500 MG CAPS Take 500 mg by mouth daily.    Yes Historical Provider, MD  Omega-3 350 MG CAPS Take 1 capsule by mouth every evening.    Yes Historical Provider, MD  omeprazole (PRILOSEC) 20 MG capsule Take 20 mg by mouth daily as needed (acid reflux).    Yes Historical Provider, MD  potassium chloride (K-DUR) 10 MEQ tablet Take 10 mEq by mouth 2 (two) times daily.   Yes Historical Provider, MD  tolterodine (DETROL LA) 2 MG 24 hr capsule Take 2 mg by mouth 2 (two) times daily.     Yes Historical Provider, MD  traMADol (ULTRAM) 50 MG tablet Take 50 mg by mouth every 6 (six) hours as needed for moderate pain.    Yes Historical Provider, MD  vitamin A 8000 UNIT capsule Take 8,000 Units by mouth every other day.     Yes Historical Provider, MD  vitamin E 400 UNIT capsule Take 400 Units by mouth daily.     Yes Historical Provider, MD  warfarin (COUMADIN) 5 MG tablet Take 2.5-5 mg by mouth every evening. On Sunday and Thursday take a half a tablet (2.5mg ).  On Monday, Tuesday, Wednesday, Friday, and Saturday take a whole 5mg  tablet.   Yes Historical Provider, MD   Physical Exam: Filed Vitals:   10/23/13 1644  BP: 164/68  Pulse: 69  Temp: 98.5 F (36.9 C)  Resp: 16    BP 164/68  Pulse 69  Temp(Src) 98.5 F (36.9 C) (Oral)  Resp 16  SpO2 96%  General: Alert, awake, oriented x3, in no acute  distress. Head: atraumatic, normocephalic Ears: normal exterior appearance, no dyscharge on visual evaluation Nose: Normal exterior appearance, no rhinorrhea Neck: No goiter, supple Heart: Regular rate and rhythm, without murmurs, rubs, gallops. Lungs: Clear to auscultation bilaterally. Abdomen: Soft, nontender, nondistended, positive bowel sounds. Extremities: No clubbing cyanosis or edema with positive pedal pulses. Localized pain at left hip with palpation Neuro: Grossly intact, nonfocal.            Labs on Admission:  Basic Metabolic Panel:  Recent Labs Lab 10/23/13 1420  NA 130*  K 4.3  CL 94*  CO2 25  GLUCOSE 97  BUN 19  CREATININE 0.57  CALCIUM 9.7   Liver Function Tests: No results found for this basename: AST, ALT, ALKPHOS, BILITOT, PROT, ALBUMIN,  in the last 168 hours No results found for this basename: LIPASE, AMYLASE,  in the last 168 hours No results found for this basename: AMMONIA,  in the last 168 hours CBC:  Recent Labs Lab 10/23/13 1420  WBC 6.8  NEUTROABS 5.1  HGB 10.7*  HCT 30.8*  MCV 104.4*  PLT 117*   Cardiac Enzymes: No results found for this basename: CKTOTAL, CKMB, CKMBINDEX, TROPONINI,  in the last 168 hours  BNP (last 3 results)  Recent Labs  12/29/12 1124  PROBNP 340.0*   CBG: No results found for this basename: GLUCAP,  in the last 168 hours  Radiological Exams on Admission: Dg Hip Complete Left  10/23/2013   CLINICAL DATA:  Left hip pain secondary to a fall last night.  EXAM: LEFT HIP - COMPLETE 2+ VIEW  COMPARISON:  None.  FINDINGS: There are fractures of the left inferior and superior pubic rami. The proximal femur is intact. Minimal degenerative changes of the left femoral head and acetabulum. No visible sacral fractures. Right proximal femur is intact.  IMPRESSION: Fractures of the left inferior and superior pubic rami.   Electronically Signed   By: Geanie Cooley M.D.   On: 10/23/2013 13:56     Assessment/Plan Principal Problem:   Fracture of multiple pubic rami - Will continue tramadol and Tylenol may have to increase pain regimen but given age I would like to start with this regimen at this time - Place order for physical therapy evaluation - Social work consult for skilled nursing facility placement Active Problems:   Chronic pain syndrome   CAROTID ARTERY DISEASE   DEGENERATIVE JOINT DISEASE - Continue home pain regimen   DIASTOLIC HEART FAILURE, CHRONIC - We'll continue home regimen, compensated currently   GERD (gastroesophageal reflux disease) - Will continue PPI we'll patient is in house, as stable    Atrial fibrillation  - We'll plan on continuing home regimen. Pharmacy to dose Coumadin    Code Status: Full code Family Communication: Discussed with patient and family member at bedside Disposition Plan: Pending physical therapy recommendations  Time spent:> 60 minutes  Penny Pia [facility] Pager [phone]

## 2013-10-24 DIAGNOSIS — I4891 Unspecified atrial fibrillation: Secondary | ICD-10-CM

## 2013-10-24 DIAGNOSIS — S32509B Unspecified fracture of unspecified pubis, initial encounter for open fracture: Secondary | ICD-10-CM | POA: Diagnosis not present

## 2013-10-24 DIAGNOSIS — IMO0001 Reserved for inherently not codable concepts without codable children: Secondary | ICD-10-CM

## 2013-10-24 DIAGNOSIS — K219 Gastro-esophageal reflux disease without esophagitis: Secondary | ICD-10-CM

## 2013-10-24 DIAGNOSIS — D649 Anemia, unspecified: Secondary | ICD-10-CM

## 2013-10-24 DIAGNOSIS — IMO0002 Reserved for concepts with insufficient information to code with codable children: Secondary | ICD-10-CM

## 2013-10-24 LAB — PROTIME-INR
INR: 2.22 — ABNORMAL HIGH (ref 0.00–1.49)
Prothrombin Time: 23.9 seconds — ABNORMAL HIGH (ref 11.6–15.2)

## 2013-10-24 LAB — BASIC METABOLIC PANEL
CO2: 25 mEq/L (ref 19–32)
Calcium: 8.9 mg/dL (ref 8.4–10.5)
Chloride: 97 mEq/L (ref 96–112)
GFR calc non Af Amer: 73 mL/min — ABNORMAL LOW (ref >90)
Glucose, Bld: 107 mg/dL — ABNORMAL HIGH (ref 70–99)
Sodium: 131 mEq/L — ABNORMAL LOW (ref 135–145)

## 2013-10-24 LAB — CBC
HCT: 28.3 % — ABNORMAL LOW (ref 36.0–46.0)
MCH: 35.7 pg — ABNORMAL HIGH (ref 26.0–34.0)
MCV: 104 fL — ABNORMAL HIGH (ref 78.0–100.0)
Platelets: 110 10*3/uL — ABNORMAL LOW (ref 150–400)
RBC: 2.72 MIL/uL — ABNORMAL LOW (ref 3.87–5.11)
WBC: 6.1 10*3/uL (ref 4.0–10.5)

## 2013-10-24 MED ORDER — WARFARIN SODIUM 5 MG PO TABS
5.0000 mg | ORAL_TABLET | Freq: Once | ORAL | Status: AC
  Administered 2013-10-24: 5 mg via ORAL
  Filled 2013-10-24: qty 1

## 2013-10-24 NOTE — Care Management Note (Signed)
UR complete    Stephanie Stanfield,MSN,RN [phone] 

## 2013-10-24 NOTE — Progress Notes (Signed)
ANTICOAGULATION CONSULT NOTE - Follow Up Consult  Pharmacy Consult for Warfarin Indication: atrial fibrillation  Allergies  Allergen Reactions  . Amiodarone Hcl     REACTION: INTOL to Amiodarone in past  . Codeine Nausea And Vomiting  . Fentanyl     REACTION: causes nausea--dizziness  . Lamisil [Terbinafine Hcl]     Causes nausea    Patient Measurements: Height: 4\' 10"  (147.3 cm) Weight: 90 lb (40.824 kg) IBW/kg (Calculated) : 40.9  Vital Signs: Temp: 98.2 F (36.8 C) (12/13 0513) Temp src: Oral (12/13 0513) BP: 134/71 mmHg (12/13 0513) Pulse Rate: 72 (12/13 0513)  Labs:  Recent Labs  10/23/13 1420 10/23/13 1818 10/24/13 0420  HGB 10.7*  --  9.7*  HCT 30.8*  --  28.3*  PLT 117*  --  110*  LABPROT  --  24.3* 23.9*  INR  --  2.27* 2.22*  CREATININE 0.57  --  0.69    Estimated Creatinine Clearance: 28.9 ml/min (by C-G formula based on Cr of 0.69).   Medications:  Scheduled:  . calcium-vitamin D  2 tablet Oral Q breakfast  . cholecalciferol  1,000 Units Oral Daily  . ferrous sulfate  325 mg Oral Q breakfast  . fesoterodine  4 mg Oral Daily  . furosemide  40 mg Oral BID  . omega-3 acid ethyl esters  1 g Oral QHS  . pantoprazole  40 mg Oral Daily  . potassium chloride  10 mEq Oral BID  . sodium chloride  3 mL Intravenous Q12H  . Warfarin - Pharmacist Dosing Inpatient   Does not apply q1800   Infusions:    Assessment: 82 yoF admitted on 12/12 with hip pain and fracture of multiple pubic rami s/p mechanical fall.  PMH includes chronic warfarin for atrial fibrillation.  Pharmacy is consulted to continue warfarin dosing inpatient.  INR was therapeutic on admission with home dose 5mg  daily except 2.5mg  on Sun/Thur.  Hip fracture: physical therapy evaluation pending.  F/u plans.  CBC:  Hgb decreased to 9.7 and Plt 110.    INR 2.22, remains therapeutic.  Goal of Therapy:  INR 2-3   Plan:   Warfarin 5mg  PO today at 1800 x1  Daily INR,  CBC   Lynann Beaver PharmD, BCPS Pager [phone] 10/24/2013 11:29 AM

## 2013-10-24 NOTE — Evaluation (Signed)
Physical Therapy Evaluation Patient Details Name: [name] MRN: [id number] DOB: Jan 16, 1919 Today's Date: 10/24/2013 Time: 9147-8295 PT Time Calculation (min): 11 min  PT Assessment / Plan / Recommendation History of Present Illness  79 year old female with history of atrial fibrillation on Coumadin with presence of permanent pacemaker, degenerative joint disease, osteoporosis, and history of diastolic heart failure. Who presented to the hospital after mechanical fall last night. Woke up this morning and had persistent discomfort at the left hip which was worse when trying to stand. Given persistence of symptoms since onset it was decided to have patient evaluated in the emergency department. Otherwise no other complaints.  Clinical Impression  Pt admitted with fall at home resulting in L inferior and superior pubic rami fractures. Pt currently with functional limitations due to the deficits listed below (see PT Problem List). Pt will benefit from skilled PT to increase their independence and safety with mobility to allow discharge to the venue listed below.  Pt requiring increased assist at this time due to pain and only able to ambulate 8 feet with RW.  Pt reports she lives with daughter however daughter works during the day and she would be home alone so discussed ST-SNF upon d/c.    PT Assessment  Patient needs continued PT services    Follow Up Recommendations  SNF    Does the patient have the potential to tolerate intense rehabilitation      Barriers to Discharge        Equipment Recommendations  None recommended by PT    Recommendations for Other Services     Frequency Min 3X/week    Precautions / Restrictions Precautions Precautions: Fall Restrictions Other Position/Activity Restrictions: WBAT verbally per Dr. Cena Benton   Pertinent Vitals/Pain Pt reports pain okay at rest, premedicated for session, reports increased pain with mobility and movement however not rated       Mobility  Bed Mobility Bed Mobility: Supine to Sit;Sitting - Scoot to Edge of Bed Supine to Sit: 3: Mod assist Sitting - Scoot to Edge of Bed: 3: Mod assist Details for Bed Mobility Assistance: assist for L LE and trunk, utilized bed pad to assist L pelvis to EOB Transfers Transfers: Sit to Stand;Stand to Sit Sit to Stand: 1: +2 Total assist;From bed;From elevated surface Sit to Stand: Patient Percentage: 50% Stand to Sit: 1: +2 Total assist;To chair/3-in-1 Stand to Sit: Patient Percentage: 60% Details for Transfer Assistance: verbal cues for hand placement Ambulation/Gait Ambulation/Gait Assistance: 3: Mod assist Ambulation Distance (Feet): 8 Feet Assistive device: Rolling walker Ambulation/Gait Assistance Details: verbal cues for sequence, WBing through UEs to decrease WB on L LE to assist with pain control, increased time to perform, limited distance due to pain and fatigue Gait Pattern: Step-to pattern;Antalgic;Decreased step length - left;Trunk flexed Gait velocity: decreased General Gait Details: pt states usually in trunk flexion upon standing and ambulation due to back pain    Exercises     PT Diagnosis: Difficulty walking;Acute pain  PT Problem List: Decreased strength;Decreased activity tolerance;Decreased mobility;Decreased knowledge of use of DME;Pain PT Treatment Interventions: DME instruction;Gait training;Functional mobility training;Therapeutic activities;Therapeutic exercise;Patient/family education     PT Goals(Current goals can be found in the care plan section) Acute Rehab PT Goals PT Goal Formulation: With patient Time For Goal Achievement: 10/31/13 Potential to Achieve Goals: Good  Visit Information  Last PT Received On: 10/24/13 Assistance Needed: +2 History of Present Illness: 79 year old female with history of atrial fibrillation on Coumadin with  presence of permanent pacemaker, degenerative joint disease, osteoporosis, and history of diastolic  heart failure. Who presented to the hospital after mechanical fall last night. Woke up this morning and had persistent discomfort at the left hip which was worse when trying to stand. Given persistence of symptoms since onset it was decided to have patient evaluated in the emergency department. Otherwise no other complaints.       Prior Functioning  Home Living Family/patient expects to be discharged to:: Skilled nursing facility Living Arrangements: Children (daughter) Type of Home: House Home Layout: One level Home Equipment: Environmental consultant - 2 wheels Prior Function Level of Independence: Independent with assistive device(s) Communication Communication: No difficulties    Cognition  Cognition Arousal/Alertness: Awake/alert Behavior During Therapy: WFL for tasks assessed/performed Overall Cognitive Status: Within Functional Limits for tasks assessed    Extremity/Trunk Assessment Lower Extremity Assessment Lower Extremity Assessment: LLE deficits/detail LLE Deficits / Details: assist required for bed mobilty due to pain   Balance    End of Session PT - End of Session Activity Tolerance: Patient limited by fatigue;Patient limited by pain Patient left: in chair;with call bell/phone within reach  GP Functional Assessment Tool Used: clinical judgement Functional Limitation: Mobility: Walking and moving around Mobility: Walking and Moving Around Current Status ([id number]): At least 40 percent but less than 60 percent impaired, limited or restricted Mobility: Walking and Moving Around Goal Status [phone]): At least 1 percent but less than 20 percent impaired, limited or restricted   [name],[name] 10/24/2013, 1:59 PM Zenovia Jarred, PT, DPT 10/24/2013 Pager: [phone]

## 2013-10-24 NOTE — Progress Notes (Signed)
Pt with decreased urine output for this shift. Only 125cc out in the past 11 hours. NP on call notified and no orders received at this time. Bladder scan shows pt only has 102cc of urine in bladder at this time. Will continue to monitor. Campos[place]Garcia, [employer]

## 2013-10-24 NOTE — Progress Notes (Signed)
[facility] PROGRESS NOTE  [name] XBJ:[id number] DOB: 04-Feb-1919 DOA: 10/23/2013 PCP: Michele Mcalpine, MD  Assessment/Plan: Principal Problem:  Fracture of multiple pubic rami  - Will continue tramadol and Tylenol  - Physical therapy evaluation pending  - Social work consult for skilled nursing facility placement  Active Problems:  Chronic pain syndrome  - Will treat with tramadol and Tylenol as indicated above CAROTID ARTERY DISEASE  DEGENERATIVE JOINT DISEASE  - Continue home pain regimen  DIASTOLIC HEART FAILURE, CHRONIC  - Stable, compensated continue home regimen GERD (gastroesophageal reflux disease)  - Will continue PPI we'll patient is in house, as stable  Atrial fibrillation  - We'll plan on continuing home regimen.  -Pharmacy dosing Coumadin  Code Status: Full Family Communication: Discussed directly with patient, had conversation with daughter yesterday Disposition Plan: Most likely to skilled nursing facility with physical therapy. Awaiting official physical therapy recommendations   Consultants:  None  Procedures:  None  Antibiotics:  None  HPI/Subjective: No new complaints reported to me by the patient this morning.  Objective: Filed Vitals:   10/24/13 0513  BP: 134/71  Pulse: 72  Temp: 98.2 F (36.8 C)  Resp: 14    Intake/Output Summary (Last 24 hours) at 10/24/13 1252 Last data filed at 10/24/13 1147  Gross per 24 hour  Intake    480 ml  Output    725 ml  Net   -245 ml   Filed Weights   10/23/13 1645  Weight: 40.824 kg (90 lb)    Exam:   General:  Pt in NAD, Alert and awake  Cardiovascular: RRR, no MRG  Respiratory: CTA BL, no wheezes  Abdomen: soft, NT, ND  Musculoskeletal: pain at left hip, no cyanosis   Data Reviewed: Basic Metabolic Panel:  Recent Labs Lab 10/23/13 1420 10/24/13 0420  NA 130* 131*  K 4.3 4.1  CL 94* 97  CO2 25 25  GLUCOSE 97 107*  BUN 19 18  CREATININE 0.57 0.69  CALCIUM 9.7  8.9   Liver Function Tests: No results found for this basename: AST, ALT, ALKPHOS, BILITOT, PROT, ALBUMIN,  in the last 168 hours No results found for this basename: LIPASE, AMYLASE,  in the last 168 hours No results found for this basename: AMMONIA,  in the last 168 hours CBC:  Recent Labs Lab 10/23/13 1420 10/24/13 0420  WBC 6.8 6.1  NEUTROABS 5.1  --   HGB 10.7* 9.7*  HCT 30.8* 28.3*  MCV 104.4* 104.0*  PLT 117* 110*   Cardiac Enzymes: No results found for this basename: CKTOTAL, CKMB, CKMBINDEX, TROPONINI,  in the last 168 hours BNP (last 3 results)  Recent Labs  12/29/12 1124  PROBNP 340.0*   CBG: No results found for this basename: GLUCAP,  in the last 168 hours  No results found for this or any previous visit (from the past 240 hour(s)).   Studies: Dg Hip Complete Left  10/23/2013   CLINICAL DATA:  Left hip pain secondary to a fall last night.  EXAM: LEFT HIP - COMPLETE 2+ VIEW  COMPARISON:  None.  FINDINGS: There are fractures of the left inferior and superior pubic rami. The proximal femur is intact. Minimal degenerative changes of the left femoral head and acetabulum. No visible sacral fractures. Right proximal femur is intact.  IMPRESSION: Fractures of the left inferior and superior pubic rami.   Electronically Signed   By: Geanie Cooley M.D.   On: 10/23/2013 13:56    Scheduled Meds: .  calcium-vitamin D  2 tablet Oral Q breakfast  . cholecalciferol  1,000 Units Oral Daily  . ferrous sulfate  325 mg Oral Q breakfast  . fesoterodine  4 mg Oral Daily  . furosemide  40 mg Oral BID  . omega-3 acid ethyl esters  1 g Oral QHS  . pantoprazole  40 mg Oral Daily  . potassium chloride  10 mEq Oral BID  . sodium chloride  3 mL Intravenous Q12H  . warfarin  5 mg Oral ONCE-1800  . Warfarin - Pharmacist Dosing Inpatient   Does not apply q1800   Continuous Infusions:   Principal Problem:   Atrial fibrillation Active Problems:   Chronic pain syndrome   CAROTID  ARTERY DISEASE   DEGENERATIVE JOINT DISEASE   DIASTOLIC HEART FAILURE, CHRONIC   GERD (gastroesophageal reflux disease)   Fracture of multiple pubic rami    Time spent: More than 35 minutes    Penny Pia  [facility] Pager [phone]. If 7PM-7AM, please contact night-coverage at [email or web address], password [facility] 10/24/2013, 12:52 PM  LOS: 1 day

## 2013-10-25 DIAGNOSIS — G894 Chronic pain syndrome: Secondary | ICD-10-CM

## 2013-10-25 DIAGNOSIS — I4891 Unspecified atrial fibrillation: Secondary | ICD-10-CM | POA: Diagnosis not present

## 2013-10-25 DIAGNOSIS — D649 Anemia, unspecified: Secondary | ICD-10-CM | POA: Diagnosis not present

## 2013-10-25 DIAGNOSIS — E871 Hypo-osmolality and hyponatremia: Secondary | ICD-10-CM

## 2013-10-25 DIAGNOSIS — I5032 Chronic diastolic (congestive) heart failure: Secondary | ICD-10-CM

## 2013-10-25 DIAGNOSIS — S32509B Unspecified fracture of unspecified pubis, initial encounter for open fracture: Secondary | ICD-10-CM | POA: Diagnosis not present

## 2013-10-25 LAB — PROTIME-INR
INR: 2.29 — ABNORMAL HIGH (ref 0.00–1.49)
Prothrombin Time: 24.5 seconds — ABNORMAL HIGH (ref 11.6–15.2)

## 2013-10-25 MED ORDER — TRAMADOL HCL 50 MG PO TABS: 50.0000 mg | ORAL_TABLET | Freq: Four times a day (QID) | ORAL | Status: DC | PRN

## 2013-10-25 MED ORDER — CEFDINIR 300 MG PO CAPS: 300.0000 mg | ORAL_CAPSULE | Freq: Two times a day (BID) | ORAL | Status: DC

## 2013-10-25 MED ORDER — WARFARIN SODIUM 2.5 MG PO TABS
2.5000 mg | ORAL_TABLET | Freq: Once | ORAL | Status: DC
  Filled 2013-10-25: qty 1

## 2013-10-25 NOTE — Progress Notes (Signed)
Discharged from floor via w/c, family with pt. No changes in assessment. Kathie Posa  

## 2013-10-25 NOTE — Progress Notes (Signed)
Occupational Therapy Evaluation Patient Details Name: [name] MRN: [id number] DOB: [birth year] Today's Date: 10/25/2013 Time: 4098-1191 OT Time Calculation (min): 20 min  OT Assessment / Plan / Recommendation History of present illness 79 year old female with history of atrial fibrillation on Coumadin with presence of permanent pacemaker, degenerative joint disease, osteoporosis, and history of diastolic heart failure. Who presented to the hospital after mechanical fall last night. Woke up this morning and had persistent discomfort at the left hip which was worse when trying to stand. Given persistence of symptoms since onset it was decided to have patient evaluated in the emergency department. Otherwise no other complaints.   Clinical Impression   Patient states pain is better today. She had difficulty advancing LEs during stand pivot transfer chair<>BSC. Patient in agreement with recommendation for SNF rehab.    OT Assessment  Patient needs continued OT Services    Follow Up Recommendations  SNF    Barriers to Discharge Decreased caregiver support    Equipment Recommendations  Other (comment) (tbd at snf)    Recommendations for Other Services    Frequency  Min 2X/week    Precautions / Restrictions Precautions Precautions: Fall Restrictions Weight Bearing Restrictions: Yes Other Position/Activity Restrictions: WBAT verbally per Dr. Cena Benton   Pertinent Vitals/Pain C/o pain with movement, not rated    ADL  Eating/Feeding: Performed;Set up Where Assessed - Eating/Feeding: Chair Grooming: Performed;Wash/dry face;Brushing hair;Applying makeup;Set up Where Assessed - Grooming: Supported sitting Lower Body Bathing: Simulated;Maximal assistance Where Assessed - Lower Body Bathing: Supported sit to stand Lower Body Dressing: Simulated;Maximal assistance Where Assessed - Lower Body Dressing: Supported sit to Pharmacist, hospital: Performed;Moderate assistance Toilet Transfer  Method: Sit to stand;Stand pivot Toilet Transfer Equipment: Bedside commode Toileting - Clothing Manipulation and Hygiene: Performed;Moderate assistance Where Assessed - Toileting Clothing Manipulation and Hygiene: Sit to stand from 3-in-1 or toilet Transfers/Ambulation Related to ADLs: had difficulty advancing legs with stand pivot transfer chair<>BSC ADL Comments: Painful bending to LEs    OT Diagnosis: Generalized weakness;Acute pain  OT Problem List: Decreased strength;Decreased range of motion;Decreased activity tolerance;Decreased safety awareness;Decreased knowledge of use of DME or AE;Pain OT Treatment Interventions: Self-care/ADL training;Therapeutic exercise;DME and/or AE instruction;Therapeutic activities;Patient/family education   OT Goals(Current goals can be found in the care plan section) Acute Rehab OT Goals Patient Stated Goal: to go home after rehab OT Goal Formulation: With patient Time For Goal Achievement: 11/08/13 Potential to Achieve Goals: Good  Visit Information  Last OT Received On: 10/25/13 Assistance Needed: +1 History of Present Illness: 79 year old female with history of atrial fibrillation on Coumadin with presence of permanent pacemaker, degenerative joint disease, osteoporosis, and history of diastolic heart failure. Who presented to the hospital after mechanical fall last night. Woke up this morning and had persistent discomfort at the left hip which was worse when trying to stand. Given persistence of symptoms since onset it was decided to have patient evaluated in the emergency department. Otherwise no other complaints.       Prior Functioning     Home Living Family/patient expects to be discharged to:: Skilled nursing facility Living Arrangements: Children Type of Home: House Home Layout: One level Home Equipment: Environmental consultant - 2 wheels;Shower seat Prior Function Level of Independence: Independent with assistive  device(s) Communication Communication: No difficulties Dominant Hand: Right         Vision/Perception Vision - History Baseline Vision: Wears glasses all the time   Cognition  Cognition Arousal/Alertness: Awake/alert Behavior During Therapy: [facility] for  tasks assessed/performed Overall Cognitive Status: Within Functional Limits for tasks assessed    Extremity/Trunk Assessment Upper Extremity Assessment Upper Extremity Assessment: RUE deficits/detail (limited shoulder ROM right due to rotator cuff injury)     End of Session OT - End of Session Equipment Utilized During Treatment: Gait belt Activity Tolerance: Patient limited by pain Patient left: in chair;with call bell/phone within reach Nurse Communication: Mobility status  GO     Aceson Labell A 10/25/2013, 11:01 AM

## 2013-10-25 NOTE — Discharge Summary (Addendum)
Physician Discharge Summary  [name]:[id number] DOB: 03-23-1919 DOA: 77/10/2013  PCP: Michele Mcalpine, MD  Admit date: 10/23/2013 Discharge date: 10/25/2013  Time spent: > 35 minutes  Recommendations for Outpatient Follow-up:  1. Please be sure to follow up with your primary care physician in 1-2 weeks or sooner should any new concerns arise 2. Continue to encourage PT at home.  Discharge Diagnoses:  Principal Problem:   Atrial fibrillation Active Problems:   Chronic pain syndrome   CAROTID ARTERY DISEASE   DEGENERATIVE JOINT DISEASE   ANEMIA   DIASTOLIC HEART FAILURE, CHRONIC   GERD (gastroesophageal reflux disease)   Fracture of multiple pubic rami   Hyponatremia   Discharge Condition: Stable.  Diet recommendation: Heart healthy diet  Filed Weights   10/23/13 1645  Weight: 40.824 kg (90 lb)    History of present illness:  79 y/o CF with h/o Atrial fibrillation on coumadin and permanent pacemaker who presented after a mechanical fall. Was diagnosed with pubic rami fracture.  Hospital Course:  Principal Problem:  Fracture of multiple pubic rami  - Will continue tramadol and Tylenol  - Physical therapy to help patient at home. - Filled out home health orders and made recommendations for home health aide  - Social work consult for skilled nursing facility placement   Active Problems:  Chronic pain syndrome  - Will treat with tramadol and Tylenol for discomfort. - PT at home.  CAROTID ARTERY DISEASE  DEGENERATIVE JOINT DISEASE  - Continue home pain regimen   DIASTOLIC HEART FAILURE, CHRONIC  - Stable, compensated continue home regimen   GERD (gastroesophageal reflux disease)  - Stable patient to continue home regimen.  Atrial fibrillation  - We'll plan on continuing home regimen.  - Discussed with patient risks associated with continuing coumadin vs discontinuing coumadin.  At this point she would like to continue coumadin and wait to discuss it  further with her cardiologist. - PT and home health aide to help patient at home with ambulation and safety precautions.  Procedures:  None  Consultations:  none  Discharge Exam: Filed Vitals:   10/25/13 0625  BP: 150/73  Pulse: 73  Temp: 98 F (36.7 C)  Resp: 20    General: Pt in NAD, Alert and Awake Cardiovascular: normal s1 and s2, no rubs Respiratory: CTA BL, no wheezes  Discharge Instructions  Discharge Orders   Future Appointments Provider Department Dept Phone   11/13/2013 3:45 PM Duke Salvia, MD [facility] [place] [facility] [phone]   11/13/2013 4:30 PM [facility] [place] [facility] [phone]   11/18/2013 4:00 PM Michele Mcalpine, MD [facility] [phone]   Future Orders Complete By Expires   Call MD for:  severe uncontrolled pain  As directed    Call MD for:  temperature >100.4  As directed    Diet - low sodium heart healthy  As directed    Discharge instructions  As directed    Comments:     With home health and physical therapy at home per patient's wishes.   Increase activity slowly  As directed        Medication List         acetaminophen 500 MG tablet  Commonly known as:  TYLENOL  Take 650 mg by mouth every 8 (eight) hours as needed. Take with tramadol as needed for pain     alendronate 70 MG tablet  Commonly known as:  FOSAMAX  Take 70 mg by  mouth every 7 (seven) days. Take with a full glass of water on an empty stomach.     Biotin 1000 MCG tablet  Take 1,000 mcg by mouth daily.     CALCIUM 600+D 600-400 MG-UNIT per tablet  Generic drug:  Calcium Carbonate-Vitamin D  Take 2 tablets by mouth daily.     ferrous sulfate 325 (65 FE) MG tablet  Take 325 mg by mouth daily with breakfast.     fluticasone 50 MCG/ACT nasal spray  Commonly known as:  FLONASE  Place 2 sprays into the nose daily as needed for allergies.     furosemide 40 MG tablet  Commonly known as:  LASIX  Take 1 tablet (40 mg  total) by mouth 2 (two) times daily.     Glucosamine 500 MG Caps  Take 500 mg by mouth daily.     Omega-3 350 MG Caps  Take 1 capsule by mouth every evening.     omeprazole 20 MG capsule  Commonly known as:  PRILOSEC  Take 20 mg by mouth daily as needed (acid reflux).     potassium chloride 10 MEQ tablet  Commonly known as:  K-DUR  Take 10 mEq by mouth 2 (two) times daily.     tolterodine 2 MG 24 hr capsule  Commonly known as:  DETROL LA  Take 2 mg by mouth 2 (two) times daily.     traMADol 50 MG tablet  Commonly known as:  ULTRAM  Take 1 tablet (50 mg total) by mouth every 6 (six) hours as needed for moderate pain.     vitamin A 8000 UNIT capsule  Take 8,000 Units by mouth every other day.     vitamin C 1000 MG tablet  Take 1,000 mg by mouth daily.     Vitamin D 1000 UNITS capsule  Take 1,000 Units by mouth daily.     vitamin E 400 UNIT capsule  Take 400 Units by mouth daily.     warfarin 5 MG tablet  Commonly known as:  COUMADIN  Take 2.5-5 mg by mouth every evening. On Sunday and Thursday take a half a tablet (2.5mg ).  On Monday, Tuesday, Wednesday, Friday, and Saturday take a whole 5mg  tablet.       Allergies  Allergen Reactions  . Amiodarone Hcl     REACTION: INTOL to Amiodarone in past  . Codeine Nausea And Vomiting  . Fentanyl     REACTION: causes nausea--dizziness  . Lamisil [Terbinafine Hcl]     Causes nausea      The results of significant diagnostics from this hospitalization (including imaging, microbiology, ancillary and laboratory) are listed below for reference.    Significant Diagnostic Studies: Dg Hip Complete Left  10/23/2013   CLINICAL DATA:  Left hip pain secondary to a fall last night.  EXAM: LEFT HIP - COMPLETE 2+ VIEW  COMPARISON:  None.  FINDINGS: There are fractures of the left inferior and superior pubic rami. The proximal femur is intact. Minimal degenerative changes of the left femoral head and acetabulum. No visible sacral  fractures. Right proximal femur is intact.  IMPRESSION: Fractures of the left inferior and superior pubic rami.   Electronically Signed   By: Geanie Cooley M.D.   On: 10/23/2013 13:56    Microbiology: Recent Results (from the past 240 hour(s))  URINE CULTURE     Status: None   Collection Time    10/23/13  9:51 PM      Result Value Range Status  Specimen Description URINE, RANDOM   Final   Special Requests NONE   Final   Culture  Setup Time     Final   Value: 10/24/2013 02:14     Performed at [facility] Count     Final   Value: >=100,000 COLONIES/ML     Performed at [facility]   Culture     Final   Value: ESCHERICHIA COLI     Performed at [facility]   Report Status PENDING   Incomplete     Labs: Basic Metabolic Panel:  Recent Labs Lab 10/23/13 1420 10/24/13 0420  NA 130* 131*  K 4.3 4.1  CL 94* 97  CO2 25 25  GLUCOSE 97 107*  BUN 19 18  CREATININE 0.57 0.69  CALCIUM 9.7 8.9   Liver Function Tests: No results found for this basename: AST, ALT, ALKPHOS, BILITOT, PROT, ALBUMIN,  in the last 168 hours No results found for this basename: LIPASE, AMYLASE,  in the last 168 hours No results found for this basename: AMMONIA,  in the last 168 hours CBC:  Recent Labs Lab 10/23/13 1420 10/24/13 0420  WBC 6.8 6.1  NEUTROABS 5.1  --   HGB 10.7* 9.7*  HCT 30.8* 28.3*  MCV 104.4* 104.0*  PLT 117* 110*   Cardiac Enzymes: No results found for this basename: CKTOTAL, CKMB, CKMBINDEX, TROPONINI,  in the last 168 hours BNP: BNP (last 3 results)  Recent Labs  12/29/12 1124  PROBNP 340.0*   CBG: No results found for this basename: GLUCAP,  in the last 168 hours     Signed:  Penny Pia  [facility] 10/25/2013, 1:43 PM    Addendum:  Urine culture grew > 100,000 cfu of E coli. Patient asymptomatic but will plan on providing script for treatment for the next 3 days with 3rd generation cephalosporin.

## 2013-10-25 NOTE — Progress Notes (Signed)
Clinical Social Work Department CLINICAL SOCIAL WORK PLACEMENT NOTE 10/25/2013  Patient:  [name], [name]  Account Number:  [id number] Admit date:  10/23/2013  Clinical Social Worker:  Doroteo Glassman  Date/time:  10/25/2013 11:13 AM  Clinical Social Work is seeking post-discharge placement for this patient at the following level of care:   SKILLED NURSING   (*CSW will update this form in Epic as items are completed)   10/25/2013  Patient/family provided with [facility] Department of Clinical Social Work's list of facilities offering this level of care within the geographic area requested by the patient (or if unable, by the patient's family).  10/25/2013  Patient/family informed of their freedom to choose among providers that offer the needed level of care, that participate in Medicare, Medicaid or managed care program needed by the patient, have an available bed and are willing to accept the patient.  10/25/2013  Patient/family informed of [facility]' ownership interest in [facility], as well as of the fact that they are under no obligation to receive care at this facility.  PASARR submitted to EDS on 10/25/2013 PASARR number received from EDS on 10/25/2013  FL2 transmitted to all facilities in geographic area requested by pt/family on  10/25/2013 FL2 transmitted to all facilities within larger geographic area on   Patient informed that his/her managed care company has contracts with or will negotiate with  certain facilities, including the following:     Patient/family informed of bed offers received:   Patient chooses bed at  Physician recommends and patient chooses bed at    Patient to be transferred to  on   Patient to be transferred to facility by   The following physician request were entered in Epic:   Additional Comments:  Providence Crosby, Theresia Majors Clinical Social Work [phone]

## 2013-10-25 NOTE — Progress Notes (Signed)
Clinical Social Work Department BRIEF PSYCHOSOCIAL ASSESSMENT 10/25/2013  Patient:  [name], [name]     Account Number:  [id number]     Admit date:  10/23/2013  Clinical Social Worker:  Doroteo Glassman  Date/Time:  10/25/2013 11:06 AM  Referred by:  Physician  Date Referred:  10/25/2013 Referred for  SNF Placement   Other Referral:   Interview type:  Patient Other interview type:    PSYCHOSOCIAL DATA Living Status:  FAMILY Admitted from facility:   Level of care:   Primary support name:  Garen Lah Primary support relationship to patient:  CHILD, ADULT Degree of support available:   strong    CURRENT CONCERNS Current Concerns  Post-Acute Placement   Other Concerns:    SOCIAL WORK ASSESSMENT / PLAN Met with Pt to discuss d/c plans.    Pt stated that she lives with her daughter but that her daughter works during the day.  Pt stated that she's not sure that she can manage without assistance while her daughter's working.    CSW gave Pt a SNF list and received permission to begin the SNF search on Pt's behalf.    CSW thanked Pt for her time.   Assessment/plan status:  Psychosocial Support/Ongoing Assessment of Needs Other assessment/ plan:   Information/referral to community resources:   SNF list    PATIENT'S/FAMILY'S RESPONSE TO PLAN OF CARE: Pt was grateful to CSW for time and assistance.  Although Pt would like to be home, she understands that she will benefit from skilled and is hopeful for [facility].    Pt thanked CSW for time and assistance.   Providence Crosby, LCSWA Clinical Social Work [phone]

## 2013-10-25 NOTE — Progress Notes (Signed)
ANTICOAGULATION CONSULT NOTE - Follow Up Consult  Pharmacy Consult for Warfarin Indication: atrial fibrillation  Allergies  Allergen Reactions  . Amiodarone Hcl     REACTION: INTOL to Amiodarone in past  . Codeine Nausea And Vomiting  . Fentanyl     REACTION: causes nausea--dizziness  . Lamisil [Terbinafine Hcl]     Causes nausea    Patient Measurements: Height: 4\' 10"  (147.3 cm) Weight: 90 lb (40.824 kg) IBW/kg (Calculated) : 40.9  Vital Signs: Temp: 98 F (36.7 C) (12/14 0625) Temp src: Oral (12/14 0625) BP: 150/73 mmHg (12/14 0625) Pulse Rate: 73 (12/14 0625)  Labs:  Recent Labs  10/23/13 1420 10/23/13 1818 10/24/13 0420 10/25/13 0446  HGB 10.7*  --  9.7*  --   HCT 30.8*  --  28.3*  --   PLT 117*  --  110*  --   LABPROT  --  24.3* 23.9* 24.5*  INR  --  2.27* 2.22* 2.29*  CREATININE 0.57  --  0.69  --     Estimated Creatinine Clearance: 28.9 ml/min (by C-G formula based on Cr of 0.69).   Medications:  Scheduled:  . calcium-vitamin D  2 tablet Oral Q breakfast  . cholecalciferol  1,000 Units Oral Daily  . ferrous sulfate  325 mg Oral Q breakfast  . fesoterodine  4 mg Oral Daily  . furosemide  40 mg Oral BID  . omega-3 acid ethyl esters  1 g Oral QHS  . pantoprazole  40 mg Oral Daily  . potassium chloride  10 mEq Oral BID  . sodium chloride  3 mL Intravenous Q12H  . Warfarin - Pharmacist Dosing Inpatient   Does not apply q1800   Infusions:    Assessment: 22 yoF admitted on 12/12 with hip pain and fracture of multiple pubic rami s/p mechanical fall.  PMH includes chronic warfarin for atrial fibrillation.  Pharmacy is consulted to continue warfarin dosing inpatient.  INR was therapeutic on admission with home dose 5mg  daily except 2.5mg  on Sun/Thur.  Hip fracture: physical therapy evaluation pending.  F/u plans.  CBC:  Hgb decreased to 9.7 and Plt 110 (12/13)  INR 2.29, remains therapeutic.  Goal of Therapy:  INR 2-3   Plan:   Warfarin 2.5mg   PO today at 1800 x1  Daily INR, CBC   Lynann Beaver PharmD, BCPS Pager [phone] 10/25/2013 10:40 AM

## 2013-10-25 NOTE — Progress Notes (Signed)
   CARE MANAGEMENT NOTE 10/25/2013  Patient:  [name], [name]   Account Number:  [id number]  Date Initiated:  10/24/2013  Documentation initiated by:  DAVIS,TYMEEKA  Subjective/Objective Assessment:   79 yo female dmitted s/p fall with pubic rami fx.     Action/Plan:   Home vs SNF   Anticipated DC Date:  10/25/2013   Anticipated DC Plan:  SKILLED NURSING FACILITY  In-house referral  Clinical Social Worker      DC Associate Professor  CM consult      [facility] Choice  HOME HEALTH   Choice offered to / List presented to:  C-4 Adult Children        HH arranged  HH-2 PT  HH-4 NURSE'S AIDE      HH agency  [facility]   Status of service:  Completed, signed off Medicare Important Message given?   (If response is "NO", the following Medicare IM given date fields will be blank) Date Medicare IM given:   Date Additional Medicare IM given:    Discharge Disposition:  HOME W HOME HEALTH SERVICES  Per UR Regulation:  Reviewed for med. necessity/level of care/duration of stay  If discussed at Long Length of Stay Meetings, dates discussed:    Comments:  10/25/2013 1500 NCM spoke to pt's and gave permission to speak to children. Spoke to dtr, Villa Burgin # [phone] and reviewed options for home. Provided with [facility] list and private duty list. Dtr states she has one week to work and then will be on vacation for two weeks. Pt's son, Mahli Glahn # [phone] is working but can assist after work with care. They will arrange other family and friends to stay with pt in home while dtr works. Pt has wheelchair and RW at home. Requesting bedside commode. Contacted [facility] for referral for [facility] and DME. Isidoro Donning RN CCM Case Mgmt phone [phone]  10/24/13 Roland Earl [phone] Ur complete

## 2013-10-25 NOTE — Progress Notes (Signed)
Informed that Pt is OBS and will be ready for d/c today.  Notified Pt and family. Pt wanting to d/c home.  Family to meet with RNCM at 3 to discuss [facility] options.  No further CSW needs identified.  Providence Crosby, LCSWA Clinical Social Work [phone]

## 2013-10-25 NOTE — Progress Notes (Signed)
Utilization Review completed.  

## 2013-10-26 DIAGNOSIS — I4891 Unspecified atrial fibrillation: Secondary | ICD-10-CM | POA: Diagnosis not present

## 2013-10-26 DIAGNOSIS — M810 Age-related osteoporosis without current pathological fracture: Secondary | ICD-10-CM | POA: Diagnosis not present

## 2013-10-26 DIAGNOSIS — G8929 Other chronic pain: Secondary | ICD-10-CM | POA: Diagnosis not present

## 2013-10-26 DIAGNOSIS — Z9181 History of falling: Secondary | ICD-10-CM | POA: Diagnosis not present

## 2013-10-26 DIAGNOSIS — I509 Heart failure, unspecified: Secondary | ICD-10-CM | POA: Diagnosis not present

## 2013-10-26 DIAGNOSIS — I5032 Chronic diastolic (congestive) heart failure: Secondary | ICD-10-CM | POA: Diagnosis not present

## 2013-10-26 DIAGNOSIS — IMO0001 Reserved for inherently not codable concepts without codable children: Secondary | ICD-10-CM | POA: Diagnosis not present

## 2013-10-26 DIAGNOSIS — Z7901 Long term (current) use of anticoagulants: Secondary | ICD-10-CM | POA: Diagnosis not present

## 2013-10-26 DIAGNOSIS — M1990 Unspecified osteoarthritis, unspecified site: Secondary | ICD-10-CM | POA: Diagnosis not present

## 2013-10-26 DIAGNOSIS — I2510 Atherosclerotic heart disease of native coronary artery without angina pectoris: Secondary | ICD-10-CM | POA: Diagnosis not present

## 2013-10-26 LAB — URINE CULTURE: Colony Count: >=100000

## 2013-10-27 DIAGNOSIS — M1990 Unspecified osteoarthritis, unspecified site: Secondary | ICD-10-CM | POA: Diagnosis not present

## 2013-10-27 DIAGNOSIS — IMO0001 Reserved for inherently not codable concepts without codable children: Secondary | ICD-10-CM | POA: Diagnosis not present

## 2013-10-27 DIAGNOSIS — I2510 Atherosclerotic heart disease of native coronary artery without angina pectoris: Secondary | ICD-10-CM | POA: Diagnosis not present

## 2013-10-27 DIAGNOSIS — I509 Heart failure, unspecified: Secondary | ICD-10-CM | POA: Diagnosis not present

## 2013-10-27 DIAGNOSIS — I5032 Chronic diastolic (congestive) heart failure: Secondary | ICD-10-CM | POA: Diagnosis not present

## 2013-10-27 DIAGNOSIS — I4891 Unspecified atrial fibrillation: Secondary | ICD-10-CM | POA: Diagnosis not present

## 2013-10-28 ENCOUNTER — Telehealth: Payer: Self-pay | Admitting: Internal Medicine

## 2013-10-28 ENCOUNTER — Telehealth: Payer: Self-pay | Admitting: Pharmacist

## 2013-10-28 NOTE — Telephone Encounter (Signed)
Spoke with Ree Kida at [facility], advised him to contact pt's PCP to discuss these orders. He is agreeable

## 2013-10-28 NOTE — Telephone Encounter (Signed)
New Problem:  Pt's daughter, Sedalia Muta, is calling to make our coumadin clinic aware the pt has recently fallen and was in the hospital. Pt has a broken pelvis. Diane states she was told to call the coumadin clinic if her mom ever falls or if anything out of the normal ever happens. Diane states it's ok to call with any questions.

## 2013-10-28 NOTE — Telephone Encounter (Signed)
Returned call to pt's daughter pt went into [facility] 12/12 s/p fall broke pelvis, d/c on 12/14.  No surgical intervention required, continues on Coumadin.  INR's consistently 2.2 in hospital, pt was d/c on same dosage of Coumadin.  Pt has appt on 11/13/13 for recheck, advised pt's daughter to keep scheduled appt.  Pt has HH in home at present for PT/OT.  If not able to get out at time of next appt advised daughter we could have HH draw in home if they were still seeing pt.  Pt's daughter verbalized understanding.

## 2013-10-28 NOTE — Telephone Encounter (Signed)
New Message  Ree Kida w/ [facility] called requesting a verbal occupational therapy referral  For this patient. He states that the  pt has fractured her pelvis// unable to get in and out of the tub// Will need daily assistance.. Please call to discuss further

## 2013-10-29 ENCOUNTER — Telehealth: Payer: Self-pay | Admitting: Pulmonary Disease

## 2013-10-29 DIAGNOSIS — I5032 Chronic diastolic (congestive) heart failure: Secondary | ICD-10-CM | POA: Diagnosis not present

## 2013-10-29 DIAGNOSIS — M1990 Unspecified osteoarthritis, unspecified site: Secondary | ICD-10-CM | POA: Diagnosis not present

## 2013-10-29 DIAGNOSIS — I509 Heart failure, unspecified: Secondary | ICD-10-CM | POA: Diagnosis not present

## 2013-10-29 DIAGNOSIS — IMO0001 Reserved for inherently not codable concepts without codable children: Secondary | ICD-10-CM | POA: Diagnosis not present

## 2013-10-29 DIAGNOSIS — I4891 Unspecified atrial fibrillation: Secondary | ICD-10-CM | POA: Diagnosis not present

## 2013-10-29 DIAGNOSIS — I2510 Atherosclerotic heart disease of native coronary artery without angina pectoris: Secondary | ICD-10-CM | POA: Diagnosis not present

## 2013-10-29 NOTE — Telephone Encounter (Signed)
I gave VO. Nothing further needed

## 2013-11-02 DIAGNOSIS — I509 Heart failure, unspecified: Secondary | ICD-10-CM | POA: Diagnosis not present

## 2013-11-02 DIAGNOSIS — M1990 Unspecified osteoarthritis, unspecified site: Secondary | ICD-10-CM | POA: Diagnosis not present

## 2013-11-02 DIAGNOSIS — I2510 Atherosclerotic heart disease of native coronary artery without angina pectoris: Secondary | ICD-10-CM | POA: Diagnosis not present

## 2013-11-02 DIAGNOSIS — IMO0001 Reserved for inherently not codable concepts without codable children: Secondary | ICD-10-CM | POA: Diagnosis not present

## 2013-11-02 DIAGNOSIS — I4891 Unspecified atrial fibrillation: Secondary | ICD-10-CM | POA: Diagnosis not present

## 2013-11-02 DIAGNOSIS — I5032 Chronic diastolic (congestive) heart failure: Secondary | ICD-10-CM | POA: Diagnosis not present

## 2013-11-03 ENCOUNTER — Encounter: Payer: Self-pay | Admitting: Internal Medicine

## 2013-11-03 DIAGNOSIS — IMO0001 Reserved for inherently not codable concepts without codable children: Secondary | ICD-10-CM | POA: Diagnosis not present

## 2013-11-03 DIAGNOSIS — I509 Heart failure, unspecified: Secondary | ICD-10-CM | POA: Diagnosis not present

## 2013-11-03 DIAGNOSIS — Z950 Presence of cardiac pacemaker: Secondary | ICD-10-CM | POA: Diagnosis not present

## 2013-11-03 DIAGNOSIS — I495 Sick sinus syndrome: Secondary | ICD-10-CM

## 2013-11-03 DIAGNOSIS — M1990 Unspecified osteoarthritis, unspecified site: Secondary | ICD-10-CM | POA: Diagnosis not present

## 2013-11-03 DIAGNOSIS — I4891 Unspecified atrial fibrillation: Secondary | ICD-10-CM | POA: Diagnosis not present

## 2013-11-03 DIAGNOSIS — I5032 Chronic diastolic (congestive) heart failure: Secondary | ICD-10-CM | POA: Diagnosis not present

## 2013-11-03 DIAGNOSIS — I2510 Atherosclerotic heart disease of native coronary artery without angina pectoris: Secondary | ICD-10-CM | POA: Diagnosis not present

## 2013-11-04 DIAGNOSIS — I5032 Chronic diastolic (congestive) heart failure: Secondary | ICD-10-CM | POA: Diagnosis not present

## 2013-11-04 DIAGNOSIS — I4891 Unspecified atrial fibrillation: Secondary | ICD-10-CM | POA: Diagnosis not present

## 2013-11-04 DIAGNOSIS — IMO0001 Reserved for inherently not codable concepts without codable children: Secondary | ICD-10-CM | POA: Diagnosis not present

## 2013-11-04 DIAGNOSIS — I2510 Atherosclerotic heart disease of native coronary artery without angina pectoris: Secondary | ICD-10-CM | POA: Diagnosis not present

## 2013-11-04 DIAGNOSIS — M1990 Unspecified osteoarthritis, unspecified site: Secondary | ICD-10-CM | POA: Diagnosis not present

## 2013-11-04 DIAGNOSIS — I509 Heart failure, unspecified: Secondary | ICD-10-CM | POA: Diagnosis not present

## 2013-11-09 DIAGNOSIS — IMO0001 Reserved for inherently not codable concepts without codable children: Secondary | ICD-10-CM | POA: Diagnosis not present

## 2013-11-09 DIAGNOSIS — I4891 Unspecified atrial fibrillation: Secondary | ICD-10-CM | POA: Diagnosis not present

## 2013-11-09 DIAGNOSIS — I509 Heart failure, unspecified: Secondary | ICD-10-CM | POA: Diagnosis not present

## 2013-11-09 DIAGNOSIS — I2510 Atherosclerotic heart disease of native coronary artery without angina pectoris: Secondary | ICD-10-CM | POA: Diagnosis not present

## 2013-11-09 DIAGNOSIS — M1990 Unspecified osteoarthritis, unspecified site: Secondary | ICD-10-CM | POA: Diagnosis not present

## 2013-11-09 DIAGNOSIS — I5032 Chronic diastolic (congestive) heart failure: Secondary | ICD-10-CM | POA: Diagnosis not present

## 2013-11-10 ENCOUNTER — Ambulatory Visit (INDEPENDENT_AMBULATORY_CARE_PROVIDER_SITE_OTHER): Payer: Medicare Other | Admitting: Cardiology

## 2013-11-10 DIAGNOSIS — Z7901 Long term (current) use of anticoagulants: Secondary | ICD-10-CM

## 2013-11-10 DIAGNOSIS — I509 Heart failure, unspecified: Secondary | ICD-10-CM | POA: Diagnosis not present

## 2013-11-10 DIAGNOSIS — I4891 Unspecified atrial fibrillation: Secondary | ICD-10-CM

## 2013-11-10 DIAGNOSIS — I5032 Chronic diastolic (congestive) heart failure: Secondary | ICD-10-CM | POA: Diagnosis not present

## 2013-11-10 DIAGNOSIS — I2510 Atherosclerotic heart disease of native coronary artery without angina pectoris: Secondary | ICD-10-CM | POA: Diagnosis not present

## 2013-11-10 DIAGNOSIS — M1990 Unspecified osteoarthritis, unspecified site: Secondary | ICD-10-CM | POA: Diagnosis not present

## 2013-11-10 DIAGNOSIS — IMO0001 Reserved for inherently not codable concepts without codable children: Secondary | ICD-10-CM | POA: Diagnosis not present

## 2013-11-10 LAB — POCT INR: INR: 4.8

## 2013-11-11 DIAGNOSIS — I2510 Atherosclerotic heart disease of native coronary artery without angina pectoris: Secondary | ICD-10-CM | POA: Diagnosis not present

## 2013-11-11 DIAGNOSIS — I5032 Chronic diastolic (congestive) heart failure: Secondary | ICD-10-CM | POA: Diagnosis not present

## 2013-11-11 DIAGNOSIS — I509 Heart failure, unspecified: Secondary | ICD-10-CM | POA: Diagnosis not present

## 2013-11-11 DIAGNOSIS — IMO0001 Reserved for inherently not codable concepts without codable children: Secondary | ICD-10-CM | POA: Diagnosis not present

## 2013-11-11 DIAGNOSIS — M1990 Unspecified osteoarthritis, unspecified site: Secondary | ICD-10-CM | POA: Diagnosis not present

## 2013-11-11 DIAGNOSIS — I4891 Unspecified atrial fibrillation: Secondary | ICD-10-CM | POA: Diagnosis not present

## 2013-11-13 ENCOUNTER — Encounter: Payer: Medicare Other | Admitting: Internal Medicine

## 2013-11-13 ENCOUNTER — Telehealth: Payer: Self-pay | Admitting: Pulmonary Disease

## 2013-11-13 DIAGNOSIS — I5032 Chronic diastolic (congestive) heart failure: Secondary | ICD-10-CM | POA: Diagnosis not present

## 2013-11-13 DIAGNOSIS — IMO0001 Reserved for inherently not codable concepts without codable children: Secondary | ICD-10-CM | POA: Diagnosis not present

## 2013-11-13 DIAGNOSIS — I4891 Unspecified atrial fibrillation: Secondary | ICD-10-CM | POA: Diagnosis not present

## 2013-11-13 DIAGNOSIS — I509 Heart failure, unspecified: Secondary | ICD-10-CM | POA: Diagnosis not present

## 2013-11-13 DIAGNOSIS — I2510 Atherosclerotic heart disease of native coronary artery without angina pectoris: Secondary | ICD-10-CM | POA: Diagnosis not present

## 2013-11-13 DIAGNOSIS — M1990 Unspecified osteoarthritis, unspecified site: Secondary | ICD-10-CM | POA: Diagnosis not present

## 2013-11-13 NOTE — Telephone Encounter (Signed)
Called and lmom for [name] to make her aware ok to cont home OT for 3 weeks.  Nothing further is needed.

## 2013-11-16 DIAGNOSIS — M1990 Unspecified osteoarthritis, unspecified site: Secondary | ICD-10-CM | POA: Diagnosis not present

## 2013-11-16 DIAGNOSIS — IMO0001 Reserved for inherently not codable concepts without codable children: Secondary | ICD-10-CM | POA: Diagnosis not present

## 2013-11-16 DIAGNOSIS — I509 Heart failure, unspecified: Secondary | ICD-10-CM | POA: Diagnosis not present

## 2013-11-16 DIAGNOSIS — I2510 Atherosclerotic heart disease of native coronary artery without angina pectoris: Secondary | ICD-10-CM | POA: Diagnosis not present

## 2013-11-16 DIAGNOSIS — I5032 Chronic diastolic (congestive) heart failure: Secondary | ICD-10-CM | POA: Diagnosis not present

## 2013-11-16 DIAGNOSIS — I4891 Unspecified atrial fibrillation: Secondary | ICD-10-CM | POA: Diagnosis not present

## 2013-11-17 ENCOUNTER — Ambulatory Visit (INDEPENDENT_AMBULATORY_CARE_PROVIDER_SITE_OTHER): Payer: Medicare Other | Admitting: Interventional Cardiology

## 2013-11-17 DIAGNOSIS — I4891 Unspecified atrial fibrillation: Secondary | ICD-10-CM

## 2013-11-17 DIAGNOSIS — M1990 Unspecified osteoarthritis, unspecified site: Secondary | ICD-10-CM | POA: Diagnosis not present

## 2013-11-17 DIAGNOSIS — I509 Heart failure, unspecified: Secondary | ICD-10-CM | POA: Diagnosis not present

## 2013-11-17 DIAGNOSIS — IMO0001 Reserved for inherently not codable concepts without codable children: Secondary | ICD-10-CM | POA: Diagnosis not present

## 2013-11-17 DIAGNOSIS — I2510 Atherosclerotic heart disease of native coronary artery without angina pectoris: Secondary | ICD-10-CM | POA: Diagnosis not present

## 2013-11-17 DIAGNOSIS — I5032 Chronic diastolic (congestive) heart failure: Secondary | ICD-10-CM | POA: Diagnosis not present

## 2013-11-17 DIAGNOSIS — Z7901 Long term (current) use of anticoagulants: Secondary | ICD-10-CM

## 2013-11-17 LAB — POCT INR: INR: 1.9

## 2013-11-18 ENCOUNTER — Ambulatory Visit: Payer: Medicare Other | Admitting: Pulmonary Disease

## 2013-11-18 DIAGNOSIS — I4891 Unspecified atrial fibrillation: Secondary | ICD-10-CM | POA: Diagnosis not present

## 2013-11-18 DIAGNOSIS — IMO0001 Reserved for inherently not codable concepts without codable children: Secondary | ICD-10-CM | POA: Diagnosis not present

## 2013-11-18 DIAGNOSIS — I509 Heart failure, unspecified: Secondary | ICD-10-CM | POA: Diagnosis not present

## 2013-11-18 DIAGNOSIS — I5032 Chronic diastolic (congestive) heart failure: Secondary | ICD-10-CM | POA: Diagnosis not present

## 2013-11-18 DIAGNOSIS — M1990 Unspecified osteoarthritis, unspecified site: Secondary | ICD-10-CM | POA: Diagnosis not present

## 2013-11-18 DIAGNOSIS — I2510 Atherosclerotic heart disease of native coronary artery without angina pectoris: Secondary | ICD-10-CM | POA: Diagnosis not present

## 2013-11-19 ENCOUNTER — Telehealth: Payer: Self-pay | Admitting: Pulmonary Disease

## 2013-11-19 DIAGNOSIS — IMO0001 Reserved for inherently not codable concepts without codable children: Secondary | ICD-10-CM | POA: Diagnosis not present

## 2013-11-19 DIAGNOSIS — I5032 Chronic diastolic (congestive) heart failure: Secondary | ICD-10-CM | POA: Diagnosis not present

## 2013-11-19 DIAGNOSIS — I509 Heart failure, unspecified: Secondary | ICD-10-CM | POA: Diagnosis not present

## 2013-11-19 DIAGNOSIS — I2510 Atherosclerotic heart disease of native coronary artery without angina pectoris: Secondary | ICD-10-CM | POA: Diagnosis not present

## 2013-11-19 DIAGNOSIS — I4891 Unspecified atrial fibrillation: Secondary | ICD-10-CM | POA: Diagnosis not present

## 2013-11-19 DIAGNOSIS — M1990 Unspecified osteoarthritis, unspecified site: Secondary | ICD-10-CM | POA: Diagnosis not present

## 2013-11-19 NOTE — Telephone Encounter (Signed)
I called and spoke with Diane. I advised her pt will need to see Ortho for this per Leigh. Nothing further needed.

## 2013-11-20 DIAGNOSIS — I2510 Atherosclerotic heart disease of native coronary artery without angina pectoris: Secondary | ICD-10-CM | POA: Diagnosis not present

## 2013-11-20 DIAGNOSIS — I4891 Unspecified atrial fibrillation: Secondary | ICD-10-CM | POA: Diagnosis not present

## 2013-11-20 DIAGNOSIS — IMO0001 Reserved for inherently not codable concepts without codable children: Secondary | ICD-10-CM | POA: Diagnosis not present

## 2013-11-20 DIAGNOSIS — M1990 Unspecified osteoarthritis, unspecified site: Secondary | ICD-10-CM | POA: Diagnosis not present

## 2013-11-20 DIAGNOSIS — I509 Heart failure, unspecified: Secondary | ICD-10-CM | POA: Diagnosis not present

## 2013-11-20 DIAGNOSIS — I5032 Chronic diastolic (congestive) heart failure: Secondary | ICD-10-CM | POA: Diagnosis not present

## 2013-11-23 DIAGNOSIS — M1990 Unspecified osteoarthritis, unspecified site: Secondary | ICD-10-CM | POA: Diagnosis not present

## 2013-11-23 DIAGNOSIS — I5032 Chronic diastolic (congestive) heart failure: Secondary | ICD-10-CM | POA: Diagnosis not present

## 2013-11-23 DIAGNOSIS — I509 Heart failure, unspecified: Secondary | ICD-10-CM | POA: Diagnosis not present

## 2013-11-23 DIAGNOSIS — I2510 Atherosclerotic heart disease of native coronary artery without angina pectoris: Secondary | ICD-10-CM | POA: Diagnosis not present

## 2013-11-23 DIAGNOSIS — I4891 Unspecified atrial fibrillation: Secondary | ICD-10-CM | POA: Diagnosis not present

## 2013-11-23 DIAGNOSIS — IMO0001 Reserved for inherently not codable concepts without codable children: Secondary | ICD-10-CM | POA: Diagnosis not present

## 2013-11-24 DIAGNOSIS — I2510 Atherosclerotic heart disease of native coronary artery without angina pectoris: Secondary | ICD-10-CM | POA: Diagnosis not present

## 2013-11-24 DIAGNOSIS — I4891 Unspecified atrial fibrillation: Secondary | ICD-10-CM | POA: Diagnosis not present

## 2013-11-24 DIAGNOSIS — I509 Heart failure, unspecified: Secondary | ICD-10-CM | POA: Diagnosis not present

## 2013-11-24 DIAGNOSIS — I5032 Chronic diastolic (congestive) heart failure: Secondary | ICD-10-CM | POA: Diagnosis not present

## 2013-11-24 DIAGNOSIS — IMO0001 Reserved for inherently not codable concepts without codable children: Secondary | ICD-10-CM | POA: Diagnosis not present

## 2013-11-24 DIAGNOSIS — M1990 Unspecified osteoarthritis, unspecified site: Secondary | ICD-10-CM | POA: Diagnosis not present

## 2013-11-25 DIAGNOSIS — I509 Heart failure, unspecified: Secondary | ICD-10-CM | POA: Diagnosis not present

## 2013-11-25 DIAGNOSIS — M1990 Unspecified osteoarthritis, unspecified site: Secondary | ICD-10-CM | POA: Diagnosis not present

## 2013-11-25 DIAGNOSIS — I4891 Unspecified atrial fibrillation: Secondary | ICD-10-CM | POA: Diagnosis not present

## 2013-11-25 DIAGNOSIS — I5032 Chronic diastolic (congestive) heart failure: Secondary | ICD-10-CM | POA: Diagnosis not present

## 2013-11-25 DIAGNOSIS — I2510 Atherosclerotic heart disease of native coronary artery without angina pectoris: Secondary | ICD-10-CM | POA: Diagnosis not present

## 2013-11-25 DIAGNOSIS — IMO0001 Reserved for inherently not codable concepts without codable children: Secondary | ICD-10-CM | POA: Diagnosis not present

## 2013-11-26 DIAGNOSIS — I2510 Atherosclerotic heart disease of native coronary artery without angina pectoris: Secondary | ICD-10-CM | POA: Diagnosis not present

## 2013-11-26 DIAGNOSIS — IMO0001 Reserved for inherently not codable concepts without codable children: Secondary | ICD-10-CM | POA: Diagnosis not present

## 2013-11-26 DIAGNOSIS — S32509A Unspecified fracture of unspecified pubis, initial encounter for closed fracture: Secondary | ICD-10-CM | POA: Diagnosis not present

## 2013-11-26 DIAGNOSIS — I4891 Unspecified atrial fibrillation: Secondary | ICD-10-CM | POA: Diagnosis not present

## 2013-11-26 DIAGNOSIS — I509 Heart failure, unspecified: Secondary | ICD-10-CM | POA: Diagnosis not present

## 2013-11-26 DIAGNOSIS — M1990 Unspecified osteoarthritis, unspecified site: Secondary | ICD-10-CM | POA: Diagnosis not present

## 2013-11-26 DIAGNOSIS — I5032 Chronic diastolic (congestive) heart failure: Secondary | ICD-10-CM | POA: Diagnosis not present

## 2013-11-30 DIAGNOSIS — I509 Heart failure, unspecified: Secondary | ICD-10-CM | POA: Diagnosis not present

## 2013-11-30 DIAGNOSIS — M1990 Unspecified osteoarthritis, unspecified site: Secondary | ICD-10-CM | POA: Diagnosis not present

## 2013-11-30 DIAGNOSIS — I5032 Chronic diastolic (congestive) heart failure: Secondary | ICD-10-CM | POA: Diagnosis not present

## 2013-11-30 DIAGNOSIS — I2510 Atherosclerotic heart disease of native coronary artery without angina pectoris: Secondary | ICD-10-CM | POA: Diagnosis not present

## 2013-11-30 DIAGNOSIS — IMO0001 Reserved for inherently not codable concepts without codable children: Secondary | ICD-10-CM | POA: Diagnosis not present

## 2013-11-30 DIAGNOSIS — I4891 Unspecified atrial fibrillation: Secondary | ICD-10-CM | POA: Diagnosis not present

## 2013-12-01 ENCOUNTER — Ambulatory Visit (INDEPENDENT_AMBULATORY_CARE_PROVIDER_SITE_OTHER): Payer: Medicare Other | Admitting: Pharmacist

## 2013-12-01 DIAGNOSIS — Z7901 Long term (current) use of anticoagulants: Secondary | ICD-10-CM

## 2013-12-01 DIAGNOSIS — I2510 Atherosclerotic heart disease of native coronary artery without angina pectoris: Secondary | ICD-10-CM | POA: Diagnosis not present

## 2013-12-01 DIAGNOSIS — I4891 Unspecified atrial fibrillation: Secondary | ICD-10-CM | POA: Diagnosis not present

## 2013-12-01 DIAGNOSIS — M1990 Unspecified osteoarthritis, unspecified site: Secondary | ICD-10-CM | POA: Diagnosis not present

## 2013-12-01 DIAGNOSIS — I5032 Chronic diastolic (congestive) heart failure: Secondary | ICD-10-CM | POA: Diagnosis not present

## 2013-12-01 DIAGNOSIS — I509 Heart failure, unspecified: Secondary | ICD-10-CM | POA: Diagnosis not present

## 2013-12-01 DIAGNOSIS — IMO0001 Reserved for inherently not codable concepts without codable children: Secondary | ICD-10-CM | POA: Diagnosis not present

## 2013-12-01 LAB — POCT INR: INR: 2.5

## 2013-12-02 DIAGNOSIS — I5032 Chronic diastolic (congestive) heart failure: Secondary | ICD-10-CM | POA: Diagnosis not present

## 2013-12-02 DIAGNOSIS — M1990 Unspecified osteoarthritis, unspecified site: Secondary | ICD-10-CM | POA: Diagnosis not present

## 2013-12-02 DIAGNOSIS — I509 Heart failure, unspecified: Secondary | ICD-10-CM | POA: Diagnosis not present

## 2013-12-02 DIAGNOSIS — I2510 Atherosclerotic heart disease of native coronary artery without angina pectoris: Secondary | ICD-10-CM | POA: Diagnosis not present

## 2013-12-02 DIAGNOSIS — IMO0001 Reserved for inherently not codable concepts without codable children: Secondary | ICD-10-CM | POA: Diagnosis not present

## 2013-12-02 DIAGNOSIS — I4891 Unspecified atrial fibrillation: Secondary | ICD-10-CM | POA: Diagnosis not present

## 2013-12-03 DIAGNOSIS — I5032 Chronic diastolic (congestive) heart failure: Secondary | ICD-10-CM | POA: Diagnosis not present

## 2013-12-03 DIAGNOSIS — I2510 Atherosclerotic heart disease of native coronary artery without angina pectoris: Secondary | ICD-10-CM | POA: Diagnosis not present

## 2013-12-03 DIAGNOSIS — M1990 Unspecified osteoarthritis, unspecified site: Secondary | ICD-10-CM | POA: Diagnosis not present

## 2013-12-03 DIAGNOSIS — I509 Heart failure, unspecified: Secondary | ICD-10-CM | POA: Diagnosis not present

## 2013-12-03 DIAGNOSIS — I4891 Unspecified atrial fibrillation: Secondary | ICD-10-CM | POA: Diagnosis not present

## 2013-12-03 DIAGNOSIS — IMO0001 Reserved for inherently not codable concepts without codable children: Secondary | ICD-10-CM | POA: Diagnosis not present

## 2013-12-04 ENCOUNTER — Other Ambulatory Visit: Payer: Self-pay | Admitting: Internal Medicine

## 2013-12-04 DIAGNOSIS — M1990 Unspecified osteoarthritis, unspecified site: Secondary | ICD-10-CM | POA: Diagnosis not present

## 2013-12-04 DIAGNOSIS — IMO0001 Reserved for inherently not codable concepts without codable children: Secondary | ICD-10-CM | POA: Diagnosis not present

## 2013-12-04 DIAGNOSIS — I4891 Unspecified atrial fibrillation: Secondary | ICD-10-CM | POA: Diagnosis not present

## 2013-12-04 DIAGNOSIS — I5032 Chronic diastolic (congestive) heart failure: Secondary | ICD-10-CM | POA: Diagnosis not present

## 2013-12-04 DIAGNOSIS — I2510 Atherosclerotic heart disease of native coronary artery without angina pectoris: Secondary | ICD-10-CM | POA: Diagnosis not present

## 2013-12-04 DIAGNOSIS — I509 Heart failure, unspecified: Secondary | ICD-10-CM | POA: Diagnosis not present

## 2013-12-08 DIAGNOSIS — M1990 Unspecified osteoarthritis, unspecified site: Secondary | ICD-10-CM | POA: Diagnosis not present

## 2013-12-08 DIAGNOSIS — I509 Heart failure, unspecified: Secondary | ICD-10-CM | POA: Diagnosis not present

## 2013-12-08 DIAGNOSIS — I2510 Atherosclerotic heart disease of native coronary artery without angina pectoris: Secondary | ICD-10-CM | POA: Diagnosis not present

## 2013-12-08 DIAGNOSIS — I4891 Unspecified atrial fibrillation: Secondary | ICD-10-CM | POA: Diagnosis not present

## 2013-12-08 DIAGNOSIS — IMO0001 Reserved for inherently not codable concepts without codable children: Secondary | ICD-10-CM | POA: Diagnosis not present

## 2013-12-08 DIAGNOSIS — I5032 Chronic diastolic (congestive) heart failure: Secondary | ICD-10-CM | POA: Diagnosis not present

## 2013-12-10 DIAGNOSIS — M1990 Unspecified osteoarthritis, unspecified site: Secondary | ICD-10-CM | POA: Diagnosis not present

## 2013-12-10 DIAGNOSIS — I4891 Unspecified atrial fibrillation: Secondary | ICD-10-CM | POA: Diagnosis not present

## 2013-12-10 DIAGNOSIS — IMO0001 Reserved for inherently not codable concepts without codable children: Secondary | ICD-10-CM | POA: Diagnosis not present

## 2013-12-10 DIAGNOSIS — I5032 Chronic diastolic (congestive) heart failure: Secondary | ICD-10-CM | POA: Diagnosis not present

## 2013-12-10 DIAGNOSIS — I2510 Atherosclerotic heart disease of native coronary artery without angina pectoris: Secondary | ICD-10-CM | POA: Diagnosis not present

## 2013-12-10 DIAGNOSIS — I509 Heart failure, unspecified: Secondary | ICD-10-CM | POA: Diagnosis not present

## 2013-12-14 DIAGNOSIS — S32509A Unspecified fracture of unspecified pubis, initial encounter for closed fracture: Secondary | ICD-10-CM | POA: Diagnosis not present

## 2013-12-15 ENCOUNTER — Ambulatory Visit (INDEPENDENT_AMBULATORY_CARE_PROVIDER_SITE_OTHER): Payer: Medicare Other | Admitting: Internal Medicine

## 2013-12-15 ENCOUNTER — Encounter: Payer: Self-pay | Admitting: Internal Medicine

## 2013-12-15 VITALS — BP 150/66 | HR 70 | Ht 59.0 in | Wt 93.1 lb

## 2013-12-15 DIAGNOSIS — I5032 Chronic diastolic (congestive) heart failure: Secondary | ICD-10-CM | POA: Diagnosis not present

## 2013-12-15 DIAGNOSIS — I4891 Unspecified atrial fibrillation: Secondary | ICD-10-CM

## 2013-12-15 DIAGNOSIS — Z7901 Long term (current) use of anticoagulants: Secondary | ICD-10-CM | POA: Diagnosis not present

## 2013-12-15 DIAGNOSIS — I442 Atrioventricular block, complete: Secondary | ICD-10-CM

## 2013-12-15 DIAGNOSIS — Z950 Presence of cardiac pacemaker: Secondary | ICD-10-CM

## 2013-12-15 LAB — MDC_IDC_ENUM_SESS_TYPE_INCLINIC
Battery Voltage: 2.76 V
Date Time Interrogation Session: 20150203173935
Implantable Pulse Generator Serial Number: 2201046
Lead Channel Impedance Value: 456 Ohm
Lead Channel Pacing Threshold Amplitude: 1.25 V
Lead Channel Pacing Threshold Pulse Width: 0.4 ms
Lead Channel Setting Pacing Pulse Width: 0.4 ms
Lead Channel Setting Sensing Sensitivity: 2 mV
MDC IDC MSMT BATTERY IMPEDANCE: 1000 Ohm — AB

## 2013-12-15 NOTE — Progress Notes (Signed)
Patient Care Team: Noralee Space, MD as PCP - General (Pulmonary Disease)   HPI  [name] is a 80 y.o. female seen in followup for permanent atrial fibrillation. She is status post AV junction on 2 separate occasions and has a previously implanted pacemaker.   Major problems remains disability 2/2 back and she also had a recent fall resulting in a pelvic fracture apparently she healed up during nicely from this.  There have been no significant shortness of breath or chest pain.  Echo done 12/03/10 demonstrated EF 65-70%; mild LVH; restricted AV motion with mean gradient of 15 mmHg; mod MR; mod-severe LAE and mod RAE; mod TR; mod PR; PASP 60.      Past Medical History  Diagnosis Date  . Pneumonia, organism unspecified   . Unspecified disease of respiratory system   . CAD (coronary artery disease)   . Ostium secundum type atrial septal defect   . AV block   . Presence of permanent cardiac pacemaker   . Venous insufficiency   . Pure hypercholesterolemia   . Diverticulosis of colon (without mention of hemorrhage)   . Overactive bladder   . DJD (degenerative joint disease)   . LBP (low back pain)   . Chronic pain syndrome   . Osteoporosis   . Anemia     Past Surgical History  Procedure Laterality Date  . Vesicovaginal fistula closure w/ tah  1973  . Cataract extraction    . Asd repair    . Pacemaker placement  [place]  . Inguinal hernia repair  2011    bilaterally  . Shoulder surgery      x 2  . Ganglion cyst excision    . Partial hysterectomy      Current Outpatient Prescriptions  Medication Sig Dispense Refill  . acetaminophen (TYLENOL) 500 MG tablet Take 650 mg by mouth every 8 (eight) hours as needed. Take with tramadol as needed for pain      . alendronate (FOSAMAX) 70 MG tablet Take 70 mg by mouth every 7 (seven) days. Take with a full glass of water on an empty stomach.       . Ascorbic Acid (VITAMIN C) 1000 MG tablet Take 1,000 mg by  mouth daily.        . Biotin 1000 MCG tablet Take 1,000 mcg by mouth daily.        . Calcium Carbonate-Vitamin D (CALCIUM 600+D) 600-400 MG-UNIT per tablet Take 2 tablets by mouth daily.        . Cholecalciferol (VITAMIN D) 1000 UNITS capsule Take 1,000 Units by mouth daily.        . ferrous sulfate 325 (65 FE) MG tablet Take 325 mg by mouth daily with breakfast.       . fluticasone (FLONASE) 50 MCG/ACT nasal spray Place 2 sprays into the nose daily as needed for allergies.       . furosemide (LASIX) 40 MG tablet TAKE 1 TABLET DAILY OR AS DIRECTED (PATIENT HAS APPOINTMENT TO SEE DR [name] ON 11/04/12 AT 11 A.M.)  90 tablet  0  . Glucosamine 500 MG CAPS Take 500 mg by mouth daily.       . Omega-3 350 MG CAPS Take 1 capsule by mouth every evening.       Marland Kitchen omeprazole (PRILOSEC) 20 MG capsule Take 20 mg by mouth daily as needed (acid reflux).       . potassium chloride (  K-DUR) 10 MEQ tablet Take 10 mEq by mouth 2 (two) times daily.      Marland Kitchen tolterodine (DETROL LA) 2 MG 24 hr capsule Take 2 mg by mouth 2 (two) times daily.        . traMADol (ULTRAM) 50 MG tablet Take 1 tablet (50 mg total) by mouth every 6 (six) hours as needed for moderate pain.  30 tablet  0  . vitamin A 8000 UNIT capsule Take 8,000 Units by mouth every other day.        . vitamin E 400 UNIT capsule Take 400 Units by mouth daily.        Marland Kitchen warfarin (COUMADIN) 5 MG tablet Take 2.5-5 mg by mouth every evening. On Sunday and Thursday take a half a tablet (2.5mg ).  On Monday, Tuesday, Wednesday, Friday, and Saturday take a whole 5mg  tablet.       No current facility-administered medications for this visit.    Allergies  Allergen Reactions  . Amiodarone Hcl     REACTION: INTOL to Amiodarone in past  . Codeine Nausea And Vomiting  . Fentanyl     REACTION: causes nausea--dizziness  . Lamisil [Terbinafine Hcl]     Causes nausea    Review of Systems negative except from HPI and PMH  Physical Exam BP 150/66  Pulse 70  Ht 4\' 11"   (1.499 m)  Wt 93 lb 1.9 oz (42.239 kg)  BMI 18.80 kg/m2 Well developed and nourished in no acute distress HENT normal Neck supple with JVP-flat Clear severe kyphosis Regular rate and rhythm, no murmurs or gallops Abd-soft with active BS No Clubbing cyanosis edema Skin-warm and dry A & Oriented  Grossly normal sensory and motor function  ECG demonstrates ventricular pacing with atrial fibrillation  Assessment and  Plan  Atrial fibrillation-permanent  stable on warfarin  Complete heart block status post AV junction ablation Stable p ost pacing  Pacemaker-[employer] The patient's device was interrogated.  The information was reviewed. No changes were made in the programming.     Hypertension  Reasonably controlled

## 2013-12-15 NOTE — Patient Instructions (Signed)
Your physician recommends that you continue on your current medications as directed. Please refer to the Current Medication list given to you today.  Your physician wants you to follow-up in: 1 year with Dr. Klein.  You will receive a reminder letter in the mail two months in advance. If you don't receive a letter, please call our office to schedule the follow-up appointment.  

## 2013-12-16 DIAGNOSIS — I4891 Unspecified atrial fibrillation: Secondary | ICD-10-CM | POA: Diagnosis not present

## 2013-12-16 DIAGNOSIS — IMO0001 Reserved for inherently not codable concepts without codable children: Secondary | ICD-10-CM | POA: Diagnosis not present

## 2013-12-16 DIAGNOSIS — I2510 Atherosclerotic heart disease of native coronary artery without angina pectoris: Secondary | ICD-10-CM | POA: Diagnosis not present

## 2013-12-16 DIAGNOSIS — I509 Heart failure, unspecified: Secondary | ICD-10-CM | POA: Diagnosis not present

## 2013-12-16 DIAGNOSIS — I5032 Chronic diastolic (congestive) heart failure: Secondary | ICD-10-CM | POA: Diagnosis not present

## 2013-12-16 DIAGNOSIS — M1990 Unspecified osteoarthritis, unspecified site: Secondary | ICD-10-CM | POA: Diagnosis not present

## 2013-12-22 ENCOUNTER — Encounter: Payer: Self-pay | Admitting: Internal Medicine

## 2013-12-24 ENCOUNTER — Ambulatory Visit (INDEPENDENT_AMBULATORY_CARE_PROVIDER_SITE_OTHER): Payer: Medicare Other | Admitting: Cardiovascular Disease

## 2013-12-24 DIAGNOSIS — IMO0001 Reserved for inherently not codable concepts without codable children: Secondary | ICD-10-CM | POA: Diagnosis not present

## 2013-12-24 DIAGNOSIS — M1990 Unspecified osteoarthritis, unspecified site: Secondary | ICD-10-CM | POA: Diagnosis not present

## 2013-12-24 DIAGNOSIS — I5032 Chronic diastolic (congestive) heart failure: Secondary | ICD-10-CM | POA: Diagnosis not present

## 2013-12-24 DIAGNOSIS — I509 Heart failure, unspecified: Secondary | ICD-10-CM | POA: Diagnosis not present

## 2013-12-24 DIAGNOSIS — I2510 Atherosclerotic heart disease of native coronary artery without angina pectoris: Secondary | ICD-10-CM | POA: Diagnosis not present

## 2013-12-24 DIAGNOSIS — I4891 Unspecified atrial fibrillation: Secondary | ICD-10-CM

## 2013-12-24 DIAGNOSIS — Z7901 Long term (current) use of anticoagulants: Secondary | ICD-10-CM

## 2013-12-24 LAB — POCT INR: INR: 1.8

## 2014-01-05 ENCOUNTER — Other Ambulatory Visit: Payer: Self-pay | Admitting: Internal Medicine

## 2014-01-13 ENCOUNTER — Ambulatory Visit (INDEPENDENT_AMBULATORY_CARE_PROVIDER_SITE_OTHER): Payer: Medicare Other | Admitting: Pulmonary Disease

## 2014-01-13 ENCOUNTER — Encounter: Payer: Self-pay | Admitting: Pulmonary Disease

## 2014-01-13 VITALS — BP 132/64 | HR 69 | Temp 97.5°F | Ht 59.0 in | Wt 98.2 lb

## 2014-01-13 DIAGNOSIS — E7800 Pure hypercholesterolemia, unspecified: Secondary | ICD-10-CM

## 2014-01-13 DIAGNOSIS — I4891 Unspecified atrial fibrillation: Secondary | ICD-10-CM | POA: Diagnosis not present

## 2014-01-13 DIAGNOSIS — I2510 Atherosclerotic heart disease of native coronary artery without angina pectoris: Secondary | ICD-10-CM

## 2014-01-13 DIAGNOSIS — M545 Low back pain, unspecified: Secondary | ICD-10-CM

## 2014-01-13 DIAGNOSIS — M1990 Unspecified osteoarthritis, unspecified site: Secondary | ICD-10-CM

## 2014-01-13 DIAGNOSIS — I5032 Chronic diastolic (congestive) heart failure: Secondary | ICD-10-CM | POA: Diagnosis not present

## 2014-01-13 DIAGNOSIS — G894 Chronic pain syndrome: Secondary | ICD-10-CM

## 2014-01-13 DIAGNOSIS — D649 Anemia, unspecified: Secondary | ICD-10-CM

## 2014-01-13 DIAGNOSIS — K219 Gastro-esophageal reflux disease without esophagitis: Secondary | ICD-10-CM

## 2014-01-13 DIAGNOSIS — M810 Age-related osteoporosis without current pathological fracture: Secondary | ICD-10-CM

## 2014-01-13 DIAGNOSIS — I872 Venous insufficiency (chronic) (peripheral): Secondary | ICD-10-CM

## 2014-01-13 DIAGNOSIS — Z950 Presence of cardiac pacemaker: Secondary | ICD-10-CM

## 2014-01-13 DIAGNOSIS — I442 Atrioventricular block, complete: Secondary | ICD-10-CM | POA: Diagnosis not present

## 2014-01-13 DIAGNOSIS — N318 Other neuromuscular dysfunction of bladder: Secondary | ICD-10-CM

## 2014-01-13 DIAGNOSIS — K5730 Diverticulosis of large intestine without perforation or abscess without bleeding: Secondary | ICD-10-CM

## 2014-01-13 NOTE — Patient Instructions (Signed)
Today we updated your med list in our EPIC system...    Continue your current medications the same...  For the bloating & abd distention>>    Continue your PRILOSEC 20mg  taken about 30 min before the 1st meal of the day...    Add ALIGN or similar Probiotic daily...    Consider the Activia yogurt as well... Finally try taking some SIMETHACONE for the gas itself>>    MYLICON (upper gas) & PHAZYME (lower gas) - take one of each up to 4 times daily...  Call for any questions or if i can be of service in any way.Marland KitchenMarland Kitchen
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
## 2014-01-14 ENCOUNTER — Telehealth: Payer: Self-pay | Admitting: *Deleted

## 2014-01-14 NOTE — Telephone Encounter (Signed)
Pt has an appt set up with Dr. Asa Lente on 05-26-2014

## 2014-01-15 ENCOUNTER — Ambulatory Visit (INDEPENDENT_AMBULATORY_CARE_PROVIDER_SITE_OTHER): Payer: Medicare Other

## 2014-01-15 ENCOUNTER — Telehealth: Payer: Self-pay | Admitting: Internal Medicine

## 2014-01-15 DIAGNOSIS — I4891 Unspecified atrial fibrillation: Secondary | ICD-10-CM | POA: Diagnosis not present

## 2014-01-15 DIAGNOSIS — Z5181 Encounter for therapeutic drug level monitoring: Secondary | ICD-10-CM

## 2014-01-15 LAB — POCT INR: INR: 1.7

## 2014-01-15 NOTE — Telephone Encounter (Signed)
Follow up      [place] with [facility] called in regards to Plan of Care From's for this pt that have been faxed over 3 x and 2 calls have been made per Tiffany.   Please give her a call back as soon as possible

## 2014-01-15 NOTE — Telephone Encounter (Signed)
It appears pcp has been taking care of continuing home health orders as I have seen documentation in the encounter & media areas of the pt chart. Tiffany is aware & will contact that office Horton Chin RN

## 2014-01-29 ENCOUNTER — Ambulatory Visit (INDEPENDENT_AMBULATORY_CARE_PROVIDER_SITE_OTHER): Payer: Medicare Other | Admitting: *Deleted

## 2014-01-29 DIAGNOSIS — Z5181 Encounter for therapeutic drug level monitoring: Secondary | ICD-10-CM

## 2014-01-29 DIAGNOSIS — Z7901 Long term (current) use of anticoagulants: Secondary | ICD-10-CM

## 2014-01-29 DIAGNOSIS — I4891 Unspecified atrial fibrillation: Secondary | ICD-10-CM | POA: Diagnosis not present

## 2014-01-29 DIAGNOSIS — Z515 Encounter for palliative care: Secondary | ICD-10-CM | POA: Insufficient documentation

## 2014-01-29 LAB — POCT INR: INR: 1.4

## 2014-02-05 ENCOUNTER — Ambulatory Visit (INDEPENDENT_AMBULATORY_CARE_PROVIDER_SITE_OTHER): Payer: Medicare Other

## 2014-02-05 DIAGNOSIS — I4891 Unspecified atrial fibrillation: Secondary | ICD-10-CM | POA: Diagnosis not present

## 2014-02-05 DIAGNOSIS — Z5181 Encounter for therapeutic drug level monitoring: Secondary | ICD-10-CM

## 2014-02-05 LAB — POCT INR: INR: 2.1

## 2014-02-17 DIAGNOSIS — Z01419 Encounter for gynecological examination (general) (routine) without abnormal findings: Secondary | ICD-10-CM | POA: Diagnosis not present

## 2014-02-17 DIAGNOSIS — N39498 Other specified urinary incontinence: Secondary | ICD-10-CM | POA: Diagnosis not present

## 2014-02-17 DIAGNOSIS — Z0000 Encounter for general adult medical examination without abnormal findings: Secondary | ICD-10-CM | POA: Diagnosis not present

## 2014-02-19 ENCOUNTER — Ambulatory Visit (INDEPENDENT_AMBULATORY_CARE_PROVIDER_SITE_OTHER): Payer: Medicare Other

## 2014-02-19 DIAGNOSIS — Z5181 Encounter for therapeutic drug level monitoring: Secondary | ICD-10-CM | POA: Diagnosis not present

## 2014-02-19 DIAGNOSIS — I4891 Unspecified atrial fibrillation: Secondary | ICD-10-CM | POA: Diagnosis not present

## 2014-02-19 LAB — POCT INR: INR: 1.9

## 2014-03-04 ENCOUNTER — Other Ambulatory Visit: Payer: Self-pay | Admitting: Internal Medicine

## 2014-03-12 ENCOUNTER — Ambulatory Visit (INDEPENDENT_AMBULATORY_CARE_PROVIDER_SITE_OTHER): Payer: Medicare Other

## 2014-03-12 DIAGNOSIS — I4891 Unspecified atrial fibrillation: Secondary | ICD-10-CM | POA: Diagnosis not present

## 2014-03-12 DIAGNOSIS — Z5181 Encounter for therapeutic drug level monitoring: Secondary | ICD-10-CM

## 2014-03-12 LAB — POCT INR: INR: 1.8

## 2014-04-09 ENCOUNTER — Ambulatory Visit (INDEPENDENT_AMBULATORY_CARE_PROVIDER_SITE_OTHER): Payer: Medicare Other | Admitting: Surgery

## 2014-04-09 DIAGNOSIS — Z5181 Encounter for therapeutic drug level monitoring: Secondary | ICD-10-CM

## 2014-04-09 DIAGNOSIS — I4891 Unspecified atrial fibrillation: Secondary | ICD-10-CM | POA: Diagnosis not present

## 2014-04-09 LAB — POCT INR: INR: 1.6

## 2014-04-21 ENCOUNTER — Encounter: Payer: Self-pay | Admitting: Internal Medicine

## 2014-04-21 DIAGNOSIS — Z950 Presence of cardiac pacemaker: Secondary | ICD-10-CM | POA: Diagnosis not present

## 2014-04-21 DIAGNOSIS — I495 Sick sinus syndrome: Secondary | ICD-10-CM | POA: Diagnosis not present

## 2014-04-23 ENCOUNTER — Ambulatory Visit (INDEPENDENT_AMBULATORY_CARE_PROVIDER_SITE_OTHER): Payer: Medicare Other | Admitting: *Deleted

## 2014-04-23 DIAGNOSIS — I4891 Unspecified atrial fibrillation: Secondary | ICD-10-CM | POA: Diagnosis not present

## 2014-04-23 DIAGNOSIS — Z5181 Encounter for therapeutic drug level monitoring: Secondary | ICD-10-CM | POA: Diagnosis not present

## 2014-04-23 LAB — POCT INR: INR: 1.6

## 2014-05-07 ENCOUNTER — Ambulatory Visit (INDEPENDENT_AMBULATORY_CARE_PROVIDER_SITE_OTHER): Payer: Medicare Other

## 2014-05-07 DIAGNOSIS — Z5181 Encounter for therapeutic drug level monitoring: Secondary | ICD-10-CM

## 2014-05-07 DIAGNOSIS — I4891 Unspecified atrial fibrillation: Secondary | ICD-10-CM | POA: Diagnosis not present

## 2014-05-07 LAB — POCT INR: INR: 1.5

## 2014-05-19 DIAGNOSIS — Z961 Presence of intraocular lens: Secondary | ICD-10-CM | POA: Diagnosis not present

## 2014-05-19 DIAGNOSIS — H353190 Nonexudative age-related macular degeneration, unspecified eye, stage unspecified: Secondary | ICD-10-CM | POA: Diagnosis not present

## 2014-05-21 ENCOUNTER — Ambulatory Visit (INDEPENDENT_AMBULATORY_CARE_PROVIDER_SITE_OTHER): Payer: Medicare Other

## 2014-05-21 ENCOUNTER — Other Ambulatory Visit: Payer: Self-pay

## 2014-05-21 DIAGNOSIS — Z5181 Encounter for therapeutic drug level monitoring: Secondary | ICD-10-CM | POA: Diagnosis not present

## 2014-05-21 DIAGNOSIS — I4891 Unspecified atrial fibrillation: Secondary | ICD-10-CM | POA: Diagnosis not present

## 2014-05-21 DIAGNOSIS — Z1231 Encounter for screening mammogram for malignant neoplasm of breast: Secondary | ICD-10-CM

## 2014-05-21 LAB — POCT INR: INR: 2.3

## 2014-05-21 NOTE — Telephone Encounter (Signed)
Close encounter 

## 2014-05-24 ENCOUNTER — Ambulatory Visit: Payer: Medicare Other | Admitting: Internal Medicine

## 2014-06-11 ENCOUNTER — Other Ambulatory Visit: Payer: Self-pay | Admitting: Internal Medicine

## 2014-06-14 ENCOUNTER — Other Ambulatory Visit (INDEPENDENT_AMBULATORY_CARE_PROVIDER_SITE_OTHER): Payer: Medicare Other

## 2014-06-14 ENCOUNTER — Ambulatory Visit (INDEPENDENT_AMBULATORY_CARE_PROVIDER_SITE_OTHER): Payer: Medicare Other | Admitting: Internal Medicine

## 2014-06-14 ENCOUNTER — Encounter: Payer: Self-pay | Admitting: Internal Medicine

## 2014-06-14 ENCOUNTER — Ambulatory Visit (INDEPENDENT_AMBULATORY_CARE_PROVIDER_SITE_OTHER): Payer: Medicare Other

## 2014-06-14 VITALS — BP 120/50 | HR 74 | Temp 98.0°F | Ht 59.0 in | Wt 94.5 lb

## 2014-06-14 DIAGNOSIS — I482 Chronic atrial fibrillation, unspecified: Secondary | ICD-10-CM

## 2014-06-14 DIAGNOSIS — M810 Age-related osteoporosis without current pathological fracture: Secondary | ICD-10-CM | POA: Diagnosis not present

## 2014-06-14 DIAGNOSIS — I5032 Chronic diastolic (congestive) heart failure: Secondary | ICD-10-CM

## 2014-06-14 DIAGNOSIS — I4891 Unspecified atrial fibrillation: Secondary | ICD-10-CM

## 2014-06-14 DIAGNOSIS — D649 Anemia, unspecified: Secondary | ICD-10-CM

## 2014-06-14 DIAGNOSIS — Z0000 Encounter for general adult medical examination without abnormal findings: Secondary | ICD-10-CM | POA: Diagnosis not present

## 2014-06-14 DIAGNOSIS — Z23 Encounter for immunization: Secondary | ICD-10-CM

## 2014-06-14 DIAGNOSIS — M545 Low back pain, unspecified: Secondary | ICD-10-CM

## 2014-06-14 DIAGNOSIS — Z5181 Encounter for therapeutic drug level monitoring: Secondary | ICD-10-CM

## 2014-06-14 DIAGNOSIS — I2510 Atherosclerotic heart disease of native coronary artery without angina pectoris: Secondary | ICD-10-CM

## 2014-06-14 LAB — TSH: TSH: 0.62 u[IU]/mL (ref 0.35–4.50)

## 2014-06-14 LAB — LIPID PANEL
Cholesterol: 154 mg/dL (ref 0–200)
HDL: 53.5 mg/dL (ref >39.00)
LDL Cholesterol: 84 mg/dL (ref 0–99)
NONHDL: 100.5
Total CHOL/HDL Ratio: 3
Triglycerides: 84 mg/dL (ref 0.0–149.0)
VLDL: 16.8 mg/dL (ref 0.0–40.0)

## 2014-06-14 LAB — BASIC METABOLIC PANEL
BUN: 24 mg/dL — ABNORMAL HIGH (ref 6–23)
CALCIUM: 9.9 mg/dL (ref 8.4–10.5)
CHLORIDE: 100 meq/L (ref 96–112)
CO2: 30 meq/L (ref 19–32)
Creatinine, Ser: 0.8 mg/dL (ref 0.4–1.2)
GFR: 71.08 mL/min (ref >60.00)
GLUCOSE: 67 mg/dL — AB (ref 70–99)
POTASSIUM: 4.7 meq/L (ref 3.5–5.1)
SODIUM: 136 meq/L (ref 135–145)

## 2014-06-14 LAB — CBC WITH DIFFERENTIAL/PLATELET
BASOS ABS: 0 10*3/uL (ref 0.0–0.1)
Basophils Relative: 0.5 % (ref 0.0–3.0)
EOS ABS: 0.1 10*3/uL (ref 0.0–0.7)
Eosinophils Relative: 1.4 % (ref 0.0–5.0)
HEMATOCRIT: 35.6 % — AB (ref 36.0–46.0)
HEMOGLOBIN: 12.1 g/dL (ref 12.0–15.0)
LYMPHS ABS: 1.3 10*3/uL (ref 0.7–4.0)
Lymphocytes Relative: 29.6 % (ref 12.0–46.0)
MCHC: 34 g/dL (ref 30.0–36.0)
MCV: 106.4 fl — AB (ref 78.0–100.0)
Monocytes Absolute: 0.4 10*3/uL (ref 0.1–1.0)
Monocytes Relative: 8.1 % (ref 3.0–12.0)
Neutro Abs: 2.7 10*3/uL (ref 1.4–7.7)
Neutrophils Relative %: 60.4 % (ref 43.0–77.0)
Platelets: 161 10*3/uL (ref 150.0–400.0)
RBC: 3.35 Mil/uL — ABNORMAL LOW (ref 3.87–5.11)
RDW: 14.1 % (ref 11.5–15.5)
WBC: 4.4 10*3/uL (ref 4.0–10.5)

## 2014-06-14 LAB — POCT INR: INR: 2.6

## 2014-06-14 NOTE — Progress Notes (Signed)
Pre visit review using our clinic review tool, if applicable. No additional management support is needed unless otherwise documented below in the visit note. 

## 2014-06-14 NOTE — Assessment & Plan Note (Signed)
Perm AF On anticoag and follows with card clinic for same

## 2014-06-14 NOTE — Assessment & Plan Note (Signed)
Prior L3 compression fracture Pelvic fx 10/2013 following accidental fall On fosamax "for years", ?2005 start - also Vit D and Ca Recheck DEXA now and consider change to Prolia or other med tx

## 2014-06-14 NOTE — Progress Notes (Signed)
Subjective:    Patient ID: [name], female    DOB: 1919-08-29, 80 y.o.   MRN: [id number]  HPI  New patient to me - here to estab with new PCP following retirement of Lenna Gilford   Here for [employer] wellness  Diet: heart healthy Physical activity: sedentary Depression/mood screen: negative Hearing: intact to whispered voice Visual acuity: grossly normal, performs annual eye exam  ADLs: capable Fall risk: none Home safety: good Cognitive evaluation: intact to orientation, naming, recall and repetition EOL planning: adv directives, full code/ I agree  I have personally reviewed and have noted 1. The patient's medical and social history 2. Their use of alcohol, tobacco or illicit drugs 3. Their current medications and supplements 4. The patient's functional ability including ADL's, fall risks, home safety risks and hearing or visual impairment. 5. Diet and physical activities 6. Evidence for depression or mood disorders  Also reviewed chronic medical issues and interval medical events  Past Medical History  Diagnosis Date  . Pneumonia, organism unspecified   . Unspecified disease of respiratory system   . CAD (coronary artery disease)   . Ostium secundum type atrial septal defect   . AV block   . Presence of permanent cardiac pacemaker   . Venous insufficiency   . Pure hypercholesterolemia   . Diverticulosis of colon (without mention of hemorrhage)   . Overactive bladder   . DJD (degenerative joint disease)   . LBP (low back pain)   . Chronic pain syndrome   . Osteoporosis   . Anemia    Family History  Problem Relation Age of Onset  . Heart disease Mother   . Breast cancer Sister   . Emphysema Father    History  Substance Use Topics  . Smoking status: Never Smoker   . Smokeless tobacco: Never Used  . Alcohol Use: No     Comment: social use    Review of Systems  Constitutional: Positive for fatigue. Negative for unexpected weight change.  Respiratory:  Negative for cough, shortness of breath and wheezing.   Cardiovascular: Negative for chest pain, palpitations and leg swelling.  Gastrointestinal: Negative for nausea, abdominal pain and diarrhea.  Musculoskeletal: Positive for arthralgias and back pain. Negative for joint swelling.  Neurological: Negative for dizziness, weakness, light-headedness and headaches.  Psychiatric/Behavioral: Negative for dysphoric mood. The patient is not nervous/anxious.   All other systems reviewed and are negative.      Objective:   Physical Exam  BP 120/50  Pulse 74  Temp(Src) 98 F (36.7 C) (Oral)  Ht 4\' 11"  (1.499 m)  Wt 94 lb 8 oz (42.865 kg)  BMI 19.08 kg/m2  SpO2 97% Wt Readings from Last 3 Encounters:  06/14/14 94 lb 8 oz (42.865 kg)  01/13/14 98 lb 3.2 oz (44.543 kg)  12/15/13 93 lb 1.9 oz (42.239 kg)   Constitutional: She appears well-developed and well-nourished. No distress. Using RW. Dtr at side Neck: Normal range of motion. Neck supple. No JVD present. No thyromegaly present.  Cardiovascular: Normal rate, iregular rhythm and normal heart sounds.  No murmur heard. No BLE edema. Pulmonary/Chest: Effort normal and breath sounds normal. No respiratory distress. She has no wheezes.  Psychiatric: She has a normal mood and affect. Her behavior is normal. Judgment and thought content normal.   Lab Results  Component Value Date   WBC 6.1 10/24/2013   HGB 9.7* 10/24/2013   HCT 28.3* 10/24/2013   PLT 110* 10/24/2013   GLUCOSE 107* 10/24/2013  CHOL 153 12/29/2012   TRIG 56.0 12/29/2012   HDL 55.60 12/29/2012   LDLCALC 86 12/29/2012   ALT 15 12/29/2012   AST 60* 12/29/2012   NA 131* 10/24/2013   K 4.1 10/24/2013   CL 97 10/24/2013   CREATININE 0.69 10/24/2013   BUN 18 10/24/2013   CO2 25 10/24/2013   TSH 0.64 12/29/2012   INR 2.3 05/21/2014    Dg Hip Complete Left  10/23/2013   CLINICAL DATA:  Left hip pain secondary to a fall last night.  EXAM: LEFT HIP - COMPLETE 2+ VIEW  COMPARISON:   None.  FINDINGS: There are fractures of the left inferior and superior pubic rami. The proximal femur is intact. Minimal degenerative changes of the left femoral head and acetabulum. No visible sacral fractures. Right proximal femur is intact.  IMPRESSION: Fractures of the left inferior and superior pubic rami.   Electronically Signed   By: Rozetta Nunnery M.D.   On: 10/23/2013 13:56       Assessment & Plan:   AWV/v70.0 -Today patient counseled on age appropriate routine health concerns for screening and prevention, each reviewed and up to date or declined. Immunizations reviewed and up to date or declined. Labs ordered and reviewed. Risk factors for depression reviewed and negative. Hearing function and visual acuity are intact. ADLs screened and addressed as needed. Functional ability and level of safety reviewed and appropriate. Education, counseling and referrals performed based on assessed risks today. Patient provided with a copy of personalized plan for preventive services.  Problem List Items Addressed This Visit   ANEMIA     Chronic hx same without specific GI abn or other problem identified On oral iron Cards resumed anticoag despite prior B rectus hematomas with B IHR (2012) and recurrent falls to decrease CVA risk Recheck CBC and continue po iron    Relevant Orders      CBC with Differential (Completed)   Atrial fibrillation     Perm AF On anticoag and follows with card clinic for same    Relevant Orders      Lipid panel (Completed)      TSH (Completed)   BACK PAIN, LUMBAR     Chronic pain syndrome with DDD/spinal stenosis, DJD Prior injections with Ramos but none >1 year as last ineffective Manages symptoms with tylenol +tramadol TID If no interventions planned or change in symptoms, I will be happy to manage meds/refills here Continue RW and home balance training    DIASTOLIC HEART FAILURE, CHRONIC     euvolemic Continue current diuretics -check BMet and other  labs follow up cards as ongoing    Relevant Orders      Basic metabolic panel (Completed)      Lipid panel (Completed)      TSH (Completed)   OSTEOPOROSIS     Prior L3 compression fracture Pelvic fx 10/2013 following accidental fall On fosamax "for years", ?2005 start - also Vit D and Ca Recheck DEXA now and consider change to Prolia or other med tx    Relevant Orders      Basic metabolic panel (Completed)      DG Bone Density    Other Visit Diagnoses   Routine general medical examination at a health care facility    -  Primary

## 2014-06-14 NOTE — Patient Instructions (Signed)
It was good to see you today.  We have reviewed your prior records including labs and tests today  Health Maintenance reviewed - pneumonia vaccine updated today - all other recommended immunizations and age-appropriate screenings are up-to-date.  Test(s) ordered today. Your results will be released to [facility] (or called to you) after review, usually within 72hours after test completion. If any changes need to be made, you will be notified at that same time.  Medications reviewed and updated, no changes recommended at this time.  we'll make referral for bone density . Our office will contact you regarding appointment(s) once made.  Please schedule followup in 6 months, call sooner if problems.

## 2014-06-14 NOTE — Assessment & Plan Note (Signed)
euvolemic Continue current diuretics -check BMet and other labs follow up cards as ongoing

## 2014-06-14 NOTE — Assessment & Plan Note (Signed)
Chronic hx same without specific GI abn or other problem identified On oral iron Cards resumed anticoag despite prior B rectus hematomas with B IHR (2012) and recurrent falls to decrease CVA risk Recheck CBC and continue po iron

## 2014-06-14 NOTE — Assessment & Plan Note (Signed)
Chronic pain syndrome with DDD/spinal stenosis, DJD Prior injections with Ramos but none >1 year as last ineffective Manages symptoms with tylenol +tramadol TID If no interventions planned or change in symptoms, I will be happy to manage meds/refills here Continue RW and home balance training

## 2014-06-15 DIAGNOSIS — Z23 Encounter for immunization: Secondary | ICD-10-CM | POA: Diagnosis not present

## 2014-06-15 NOTE — Addendum Note (Signed)
Addended by: Lowella Dandy on: 06/15/2014 08:38 AM   Modules accepted: Orders

## 2014-06-16 ENCOUNTER — Encounter: Payer: Self-pay | Admitting: *Deleted

## 2014-06-17 ENCOUNTER — Ambulatory Visit (INDEPENDENT_AMBULATORY_CARE_PROVIDER_SITE_OTHER)
Admission: RE | Admit: 2014-06-17 | Discharge: 2014-06-17 | Disposition: A | Discharge status: Home / Self Care | Payer: Medicare Other | Source: Ambulatory Visit | Attending: Internal Medicine | Admitting: Internal Medicine

## 2014-06-17 DIAGNOSIS — M810 Age-related osteoporosis without current pathological fracture: Secondary | ICD-10-CM

## 2014-06-20 ENCOUNTER — Encounter: Payer: Self-pay | Admitting: Internal Medicine

## 2014-06-21 ENCOUNTER — Telehealth: Payer: Self-pay | Admitting: Internal Medicine

## 2014-06-21 ENCOUNTER — Ambulatory Visit
Admission: RE | Admit: 2014-06-21 | Discharge: 2014-06-21 | Disposition: A | Discharge status: Home / Self Care | Payer: Medicare Other | Source: Ambulatory Visit

## 2014-06-21 ENCOUNTER — Encounter: Payer: Self-pay | Admitting: Internal Medicine

## 2014-06-21 DIAGNOSIS — Z1231 Encounter for screening mammogram for malignant neoplasm of breast: Secondary | ICD-10-CM | POA: Diagnosis not present

## 2014-06-21 NOTE — Telephone Encounter (Signed)
Patient wants to move forward with getting Prolia injections every 6 months as recommended by Dr. Asa Lente. Needs insurance verification. Thanks!

## 2014-06-22 ENCOUNTER — Other Ambulatory Visit: Payer: Self-pay | Admitting: Internal Medicine

## 2014-06-22 NOTE — Telephone Encounter (Signed)
Sent pt's info to [employer] for insurance verification and will notify you as soon as I have a response. Thank you.

## 2014-06-23 ENCOUNTER — Other Ambulatory Visit: Payer: Self-pay

## 2014-07-01 NOTE — Telephone Encounter (Signed)
I have rec'd pt's insurance verification for her Prolia injection. [name] has met her MCR deductible, which will leave approximately 20% being her responsibility.  [name] does have a secondary but has only met $256 of a $2500 deductible, which means the 20% balance from MCR will go toward her deductible.  [name] will be responsible for an estimated $180.  I have sent a copy of the summary of benefits to be scanned into pt's chart. If you have further questions, please let me know. Thank you.  °

## 2014-07-02 ENCOUNTER — Ambulatory Visit (INDEPENDENT_AMBULATORY_CARE_PROVIDER_SITE_OTHER): Payer: Medicare Other

## 2014-07-02 DIAGNOSIS — I4891 Unspecified atrial fibrillation: Secondary | ICD-10-CM | POA: Diagnosis not present

## 2014-07-02 DIAGNOSIS — Z5181 Encounter for therapeutic drug level monitoring: Secondary | ICD-10-CM

## 2014-07-02 LAB — POCT INR: INR: 1.8

## 2014-07-21 ENCOUNTER — Encounter: Payer: Self-pay | Admitting: Internal Medicine

## 2014-07-21 DIAGNOSIS — Z950 Presence of cardiac pacemaker: Secondary | ICD-10-CM | POA: Diagnosis not present

## 2014-07-21 DIAGNOSIS — I495 Sick sinus syndrome: Secondary | ICD-10-CM | POA: Diagnosis not present

## 2014-07-30 ENCOUNTER — Ambulatory Visit (INDEPENDENT_AMBULATORY_CARE_PROVIDER_SITE_OTHER): Payer: Medicare Other | Admitting: *Deleted

## 2014-07-30 DIAGNOSIS — I4891 Unspecified atrial fibrillation: Secondary | ICD-10-CM

## 2014-07-30 DIAGNOSIS — Z5181 Encounter for therapeutic drug level monitoring: Secondary | ICD-10-CM | POA: Diagnosis not present

## 2014-07-30 LAB — POCT INR: INR: 2.2

## 2014-08-08 ENCOUNTER — Other Ambulatory Visit: Payer: Self-pay | Admitting: Internal Medicine

## 2014-09-08 ENCOUNTER — Ambulatory Visit (INDEPENDENT_AMBULATORY_CARE_PROVIDER_SITE_OTHER): Payer: Medicare Other | Admitting: *Deleted

## 2014-09-08 DIAGNOSIS — I4891 Unspecified atrial fibrillation: Secondary | ICD-10-CM

## 2014-09-08 DIAGNOSIS — Z23 Encounter for immunization: Secondary | ICD-10-CM

## 2014-09-08 DIAGNOSIS — Z5181 Encounter for therapeutic drug level monitoring: Secondary | ICD-10-CM

## 2014-09-08 LAB — POCT INR: INR: 2

## 2014-09-14 ENCOUNTER — Other Ambulatory Visit: Payer: Self-pay | Admitting: Internal Medicine

## 2014-09-29 DIAGNOSIS — M5136 Other intervertebral disc degeneration, lumbar region: Secondary | ICD-10-CM | POA: Diagnosis not present

## 2014-09-29 DIAGNOSIS — M5442 Lumbago with sciatica, left side: Secondary | ICD-10-CM | POA: Diagnosis not present

## 2014-09-29 DIAGNOSIS — M5441 Lumbago with sciatica, right side: Secondary | ICD-10-CM | POA: Diagnosis not present

## 2014-09-29 DIAGNOSIS — M4807 Spinal stenosis, lumbosacral region: Secondary | ICD-10-CM | POA: Diagnosis not present

## 2014-10-20 ENCOUNTER — Ambulatory Visit (INDEPENDENT_AMBULATORY_CARE_PROVIDER_SITE_OTHER): Payer: Medicare Other | Admitting: *Deleted

## 2014-10-20 DIAGNOSIS — I495 Sick sinus syndrome: Secondary | ICD-10-CM | POA: Diagnosis not present

## 2014-10-20 DIAGNOSIS — I4891 Unspecified atrial fibrillation: Secondary | ICD-10-CM | POA: Diagnosis not present

## 2014-10-20 DIAGNOSIS — Z5181 Encounter for therapeutic drug level monitoring: Secondary | ICD-10-CM | POA: Diagnosis not present

## 2014-10-20 DIAGNOSIS — Z950 Presence of cardiac pacemaker: Secondary | ICD-10-CM | POA: Diagnosis not present

## 2014-10-20 LAB — POCT INR: INR: 3

## 2014-11-06 ENCOUNTER — Other Ambulatory Visit: Payer: Self-pay | Admitting: Internal Medicine

## 2014-11-08 ENCOUNTER — Ambulatory Visit (INDEPENDENT_AMBULATORY_CARE_PROVIDER_SITE_OTHER): Payer: Medicare Other | Admitting: *Deleted

## 2014-11-08 DIAGNOSIS — I4891 Unspecified atrial fibrillation: Secondary | ICD-10-CM | POA: Diagnosis not present

## 2014-11-08 DIAGNOSIS — Z5181 Encounter for therapeutic drug level monitoring: Secondary | ICD-10-CM

## 2014-11-08 LAB — POCT INR: INR: 2.1

## 2014-12-06 ENCOUNTER — Ambulatory Visit (INDEPENDENT_AMBULATORY_CARE_PROVIDER_SITE_OTHER): Payer: Medicare Other | Admitting: *Deleted

## 2014-12-06 DIAGNOSIS — I4891 Unspecified atrial fibrillation: Secondary | ICD-10-CM | POA: Diagnosis not present

## 2014-12-06 DIAGNOSIS — Z5181 Encounter for therapeutic drug level monitoring: Secondary | ICD-10-CM | POA: Diagnosis not present

## 2014-12-06 LAB — POCT INR: INR: 3.2

## 2014-12-20 ENCOUNTER — Ambulatory Visit (INDEPENDENT_AMBULATORY_CARE_PROVIDER_SITE_OTHER): Payer: Medicare Other | Admitting: *Deleted

## 2014-12-20 DIAGNOSIS — Z5181 Encounter for therapeutic drug level monitoring: Secondary | ICD-10-CM | POA: Diagnosis not present

## 2014-12-20 DIAGNOSIS — I4891 Unspecified atrial fibrillation: Secondary | ICD-10-CM

## 2014-12-20 LAB — POCT INR: INR: 3.1

## 2014-12-27 ENCOUNTER — Ambulatory Visit: Payer: Medicare Other | Admitting: Internal Medicine

## 2014-12-28 ENCOUNTER — Encounter: Payer: Self-pay | Admitting: Internal Medicine

## 2014-12-28 ENCOUNTER — Other Ambulatory Visit (INDEPENDENT_AMBULATORY_CARE_PROVIDER_SITE_OTHER): Payer: Medicare Other

## 2014-12-28 ENCOUNTER — Ambulatory Visit (INDEPENDENT_AMBULATORY_CARE_PROVIDER_SITE_OTHER): Payer: Medicare Other | Admitting: Internal Medicine

## 2014-12-28 ENCOUNTER — Other Ambulatory Visit: Payer: Self-pay | Admitting: Internal Medicine

## 2014-12-28 VITALS — BP 120/60 | HR 83 | Temp 97.6°F | Ht 59.0 in | Wt 93.0 lb

## 2014-12-28 DIAGNOSIS — R392 Extrarenal uremia: Secondary | ICD-10-CM | POA: Diagnosis not present

## 2014-12-28 DIAGNOSIS — Z7901 Long term (current) use of anticoagulants: Secondary | ICD-10-CM | POA: Diagnosis not present

## 2014-12-28 DIAGNOSIS — I5032 Chronic diastolic (congestive) heart failure: Secondary | ICD-10-CM

## 2014-12-28 DIAGNOSIS — D539 Nutritional anemia, unspecified: Secondary | ICD-10-CM

## 2014-12-28 DIAGNOSIS — M810 Age-related osteoporosis without current pathological fracture: Secondary | ICD-10-CM | POA: Diagnosis not present

## 2014-12-28 DIAGNOSIS — K219 Gastro-esophageal reflux disease without esophagitis: Secondary | ICD-10-CM

## 2014-12-28 LAB — CBC WITH DIFFERENTIAL/PLATELET
Basophils Absolute: 0 10*3/uL (ref 0.0–0.1)
Basophils Relative: 0.3 % (ref 0.0–3.0)
Eosinophils Absolute: 0.3 10*3/uL (ref 0.0–0.7)
Eosinophils Relative: 5.1 % — ABNORMAL HIGH (ref 0.0–5.0)
HCT: 34.5 % — ABNORMAL LOW (ref 36.0–46.0)
Hemoglobin: 11.4 g/dL — ABNORMAL LOW (ref 12.0–15.0)
Lymphocytes Relative: 15.7 % (ref 12.0–46.0)
Lymphs Abs: 0.9 10*3/uL (ref 0.7–4.0)
MCHC: 33.1 g/dL (ref 30.0–36.0)
MCV: 105.2 fl — AB (ref 78.0–100.0)
MONO ABS: 0.6 10*3/uL (ref 0.1–1.0)
MONOS PCT: 10.9 % (ref 3.0–12.0)
NEUTROS PCT: 68 % (ref 43.0–77.0)
Neutro Abs: 3.9 10*3/uL (ref 1.4–7.7)
PLATELETS: 168 10*3/uL (ref 150.0–400.0)
RBC: 3.28 Mil/uL — ABNORMAL LOW (ref 3.87–5.11)
RDW: 14.5 % (ref 11.5–15.5)
WBC: 5.7 10*3/uL (ref 4.0–10.5)

## 2014-12-28 LAB — BASIC METABOLIC PANEL
BUN: 18 mg/dL (ref 6–23)
CALCIUM: 9.8 mg/dL (ref 8.4–10.5)
CO2: 29 mEq/L (ref 19–32)
Chloride: 102 mEq/L (ref 96–112)
Creatinine, Ser: 0.76 mg/dL (ref 0.40–1.20)
GFR: 75.33 mL/min (ref >60.00)
Glucose, Bld: 93 mg/dL (ref 70–99)
Potassium: 4.9 mEq/L (ref 3.5–5.1)
Sodium: 137 mEq/L (ref 135–145)

## 2014-12-28 NOTE — Patient Instructions (Signed)
STOP Fosamax  Your next office appointment will be determined based upon review of your pending labs  Those instructions will be transmitted to you through My Chart Critical values will be called. Followup as needed for any active or acute issue. Please report any significant change in your symptoms.

## 2014-12-28 NOTE — Progress Notes (Signed)
Pre visit review using our clinic review tool, if applicable. No additional management support is needed unless otherwise documented below in the visit note. 

## 2014-12-28 NOTE — Progress Notes (Signed)
   Subjective:    Patient ID: [name], female    DOB: 09/11/1919, 81 y.o.   MRN: [id number]  HPI The patient is here to assess status of active health conditions.  PMH, FH, & Social History reviewed & updated.  She has paroxysmal atrial fibrillation and is status post ablation procedure. She is followed by Dr. [name]. He has prescribed a protocol of increasing furosemide dosage for 2 days if she gains more than 3 pounds. She is on warfarin and is followed at the Coumadin clinic. Her most recent PT/INR was 3.1.  She denies any significant bleeding with warfarin except some easy bruising.  She has no active cardio pulmonary symptoms except for occasional edema and occasional dyspnea exertion when she gains more than 3 pounds. This typically will occur up to 3 times a month.  She has been on Fosamax for well over 5 years. She has documented osteoporosis.  She also has spinal stenosis and degenerative joint disease. She takes tramadol for the joint disease with some response.  The shoulder pain is associated with marked decreased range of motion particularly of the right shoulder.Her daughter states she has no rotator cuff remaining & calcium deposits are present.  She has omeprazole which she takes as needed. This is taken very rarely; she has no active GI symptoms.   Chart was reviewed;  she was found to have prerenal azotemia and mild anemia. Actually the anemia has been improving on serial CBCs.  Review of Systems  Unexplained weight loss, abdominal pain, significant dyspepsia, dysphagia, melena, rectal bleeding, or persistently small caliber stools are denied.    Objective:   Physical Exam  Pertinent or positive findings include: Appears younger than stated age She's wearing a hearing aid on the right but not on the left.Hearing acuity is significantly compromised. Dentition is beautiful with no dental disease. Heart sounds are somewhat distant; she has an S4 with  slurring. She does have sticky rales at the bases with no increased work of breathing. She has 1+ edema over the lower extremities. Pedal pulses are decreased. Stasis dermatitis is present. There is a shiny character to the skin over the lower extremities w/o ischemic changes. She is unable to raise the right upper extremity above the shoulder level. She has marked mixed DIP and PIP arthritic changes, mainly DIP.   General appearance :Frail but adequately nourished; in no distress. Eyes: No conjunctival inflammation or scleral icterus is present. Oral exam:  Lips and gums are healthy appearing.There is no oropharyngeal erythema or exudate noted.  Heart:  Normal rate and regular rhythm. S1 and S2 normal without gallop, murmur, click,or  rub Abdomen: bowel sounds normal, soft and non-tender without masses, organomegaly or hernias noted.  No guarding or rebound.  Vascular : all pulses equal ; no bruits present. Lymphatic: No lymphadenopathy is noted about the head, neck, axilla Neuro: Strength decreased; tone normal. Using rolling walker         Assessment & Plan:  #1 osteoporosis; she's had adequate bisphosphonate therapy. Fosamax should be discontinued  #2 She has paroxysmal atrial fibrillation with diastolic failure. This is well controlled by the regimen prescribed by Dr. [name]. No change indicated.  #3 She has shoulder impingement syndrome; orthopedic referral declined   #4 prerenal azotemia most likely related to furosemide  #5 anemia, improving  Plan: See orders and recommendations

## 2014-12-29 NOTE — Assessment & Plan Note (Signed)
D/C Fosamax

## 2014-12-29 NOTE — Assessment & Plan Note (Signed)
CBC

## 2014-12-29 NOTE — Assessment & Plan Note (Signed)
Coumadin Clinic 

## 2014-12-29 NOTE — Assessment & Plan Note (Signed)
Continue furosemide adjustment protocol as per Dr [name]

## 2015-01-03 ENCOUNTER — Ambulatory Visit (INDEPENDENT_AMBULATORY_CARE_PROVIDER_SITE_OTHER): Payer: Medicare Other

## 2015-01-03 DIAGNOSIS — Z5181 Encounter for therapeutic drug level monitoring: Secondary | ICD-10-CM | POA: Diagnosis not present

## 2015-01-03 DIAGNOSIS — I4891 Unspecified atrial fibrillation: Secondary | ICD-10-CM

## 2015-01-03 LAB — POCT INR: INR: 2.7

## 2015-01-24 ENCOUNTER — Ambulatory Visit (INDEPENDENT_AMBULATORY_CARE_PROVIDER_SITE_OTHER): Payer: Medicare Other | Admitting: *Deleted

## 2015-01-24 DIAGNOSIS — Z5181 Encounter for therapeutic drug level monitoring: Secondary | ICD-10-CM

## 2015-01-24 DIAGNOSIS — I4891 Unspecified atrial fibrillation: Secondary | ICD-10-CM

## 2015-01-24 LAB — POCT INR: INR: 1.7

## 2015-02-01 ENCOUNTER — Encounter: Payer: Self-pay | Admitting: Internal Medicine

## 2015-02-01 ENCOUNTER — Ambulatory Visit (INDEPENDENT_AMBULATORY_CARE_PROVIDER_SITE_OTHER): Payer: Medicare Other | Admitting: Internal Medicine

## 2015-02-01 VITALS — BP 126/60 | HR 70 | Ht 59.0 in | Wt 95.0 lb

## 2015-02-01 DIAGNOSIS — Z45018 Encounter for adjustment and management of other part of cardiac pacemaker: Secondary | ICD-10-CM | POA: Diagnosis not present

## 2015-02-01 DIAGNOSIS — I442 Atrioventricular block, complete: Secondary | ICD-10-CM

## 2015-02-01 DIAGNOSIS — I482 Chronic atrial fibrillation, unspecified: Secondary | ICD-10-CM

## 2015-02-01 DIAGNOSIS — I10 Essential (primary) hypertension: Secondary | ICD-10-CM | POA: Diagnosis not present

## 2015-02-01 LAB — MDC_IDC_ENUM_SESS_TYPE_INCLINIC
Implantable Pulse Generator Serial Number: 2201046
Lead Channel Impedance Value: 439 Ohm
Lead Channel Pacing Threshold Amplitude: 1.375 V
Lead Channel Pacing Threshold Pulse Width: 0.4 ms
Lead Channel Setting Pacing Pulse Width: 0.4 ms
MDC IDC MSMT BATTERY VOLTAGE: 2.76 V
MDC IDC SET LEADCHNL RV SENSING SENSITIVITY: 2 mV
MDC IDC STAT BRADY RV PERCENT PACED: 99 % — AB

## 2015-02-01 NOTE — Progress Notes (Signed)
Patient Care Team: Rowe Clack, MD as PCP - General (Internal Medicine) [name], MD (Cardiology) Suella Broad, MD (Physical Medicine and Rehabilitation) Newton Pigg, MD (Obstetrics and Gynecology)   HPI  [name] is a 81 y.o. female seen in followup for permanent atrial fibrillation. She is status post AV junction on 2 separate occasions and has a previously implanted pacemaker.   Major problems at this juncture is significant fatigue with effort. Her back is feeling somewhat better. There is a significant amount of discomfort in her thighs bilaterally. She denies chest pain.  Echo done 12/03/10 demonstrated EF 65-70%; mild LVH; restricted AV motion with mean gradient of 15 mmHg; mod MR; mod-severe LAE and mod RAE; mod TR; mod PR; PASP 60.      Past Medical History  Diagnosis Date  . CAD (coronary artery disease)   . Ostium secundum type atrial septal defect   . AV block     s/p PPM  . Venous insufficiency   . Pure hypercholesterolemia   . Diverticulosis of colon (without mention of hemorrhage)   . Overactive bladder   . DJD (degenerative joint disease)   . LBP (low back pain)   . Chronic pain syndrome   . Osteoporosis     pelvic fx 10/2013 s/p fall  . Anemia     Past Surgical History  Procedure Laterality Date  . Vesicovaginal fistula closure w/ tah  1973  . Cataract extraction    . Asd repair    . Pacemaker placement  [place]  . Inguinal hernia repair  2011    bilaterally  . Shoulder surgery      x 2  . Ganglion cyst excision    . Partial hysterectomy      Current Outpatient Prescriptions  Medication Sig Dispense Refill  . acetaminophen (TYLENOL) 500 MG tablet Take 650 mg by mouth every 8 (eight) hours as needed. Take with tramadol as needed for pain    . Ascorbic Acid (VITAMIN C) 1000 MG tablet Take 1,000 mg by mouth daily.      . Biotin 1000 MCG tablet Take 1,000 mcg by mouth daily.      . Calcium Carbonate-Vitamin D  (CALCIUM 600+D) 600-400 MG-UNIT per tablet Take 2 tablets by mouth daily.      . Cholecalciferol (VITAMIN D) 1000 UNITS capsule Take 1,000 Units by mouth daily.      . ferrous sulfate 325 (65 FE) MG tablet Take 325 mg by mouth daily with breakfast.     . fluticasone (FLONASE) 50 MCG/ACT nasal spray Place 2 sprays into the nose daily as needed for allergies.     . furosemide (LASIX) 40 MG tablet TAKE 1 TABLET DAILY OR AS DIRECTED 90 tablet 1  . Glucosamine 500 MG CAPS Take 500 mg by mouth daily.     . Multiple Vitamins-Minerals (PRESERVISION AREDS) TABS Take 1 tablet by mouth daily.    . Omega-3 350 MG CAPS Take 1 capsule by mouth every evening.     Marland Kitchen omeprazole (PRILOSEC) 20 MG capsule Take 20 mg by mouth daily as needed (acid reflux).     . potassium chloride (K-DUR) 10 MEQ tablet TAKE 1 TABLET TWICE A DAY AND AS NEEDED WHEN TAKING EXTRA FUROSEMIDE 270 tablet 3  . tolterodine (DETROL LA) 2 MG 24 hr capsule Take 2 mg by mouth 2 (two) times daily.      . traMADol (ULTRAM) 50 MG  tablet Take 1 tablet (50 mg total) by mouth every 6 (six) hours as needed for moderate pain. 30 tablet 0  . vitamin A 8000 UNIT capsule Take 8,000 Units by mouth every other day.      . vitamin E 400 UNIT capsule Take 400 Units by mouth daily.      Marland Kitchen warfarin (COUMADIN) 5 MG tablet TAKE AS DIRECTED BY COUMADIN CLINIC 90 tablet 1   No current facility-administered medications for this visit.    Allergies  Allergen Reactions  . Amiodarone Hcl     REACTION: INTOL to Amiodarone in past  . Codeine Nausea And Vomiting  . Fentanyl     REACTION: causes nausea--dizziness  . Lamisil [Terbinafine Hcl]     Causes nausea    Review of Systems negative except from HPI and PMH  Physical Exam BP 126/60 mmHg  Pulse 70  Ht 4\' 11"  (1.499 m)  Wt 95 lb (43.092 kg)  BMI 19.18 kg/m2  LMP  (LMP Unknown) Well developed and nourished in no acute distress HENT normal Neck supple with JVP-8 Clear severe kyphosis Regular rate and  rhythm, no murmurs or gallops Abd-soft with active BS No Clubbing cyanosis 2+ edema Skin-warm and dry A & Oriented  Grossly normal sensory and motor function  ECG demonstrates ventricular pacing with atrial fibrillation  Assessment and  Plan  Atrial fibrillation-permanent  stable on warfarin  Complete heart block status post AV junction ablation Stable p ost pacing  Pacemaker-[employer] The patient's device was interrogated.  The information was reviewed. No changes were made in the programming.    HFpEF  Hypertension  Reasonably controlled  Myalgias  Her leg pain may well be related to spinal stenosis in her back issues; however, given her age we will check a sedimentation rate to exclude polymyalgia rheumatica. She is volume overloaded significantly. I suspect her dry weight is about 80 pounds less than what she is now. We will increase her diuretics and 40 daily to 40 alternating daily with twice a day. We will continue her potassium at current twice a day dosing. We will have her follow-up with the PA in about 2 weeks to reassess volume status and she will need a metabolic profile at that time.  Last renal function assessment to/16 was 0.76 with potassium of 4.9

## 2015-02-01 NOTE — Patient Instructions (Addendum)
Your physician has recommended you make the following change in your medication:  1) Change the way you take your Lasix --- alternate taking 40 mg twice daily one day, next day 40 mg, next day 40 mg twice daily, next day 40 mg, etc.....  Labs today: Erythrocyte sedimentation rate   Your physician recommends that you return for lab work in: 2 weeks for BMET  Your physician recommends that you schedule a follow-up appointment in: 2 weeks with Richardson Dopp, PA  Your physician wants you to follow-up in: 6 months with device clinic.  You will receive a reminder letter in the mail two months in advance. If you don't receive a letter, please call our office to schedule the follow-up appointment.  Your physician wants you to follow-up in: 1 year with Dr. [name].  You will receive a reminder letter in the mail two months in advance. If you don't receive a letter, please call our office to schedule the follow-up appointment.

## 2015-02-02 LAB — SEDIMENTATION RATE: Sed Rate: 26 mm/hr — ABNORMAL HIGH (ref 0–22)

## 2015-02-11 ENCOUNTER — Ambulatory Visit (INDEPENDENT_AMBULATORY_CARE_PROVIDER_SITE_OTHER): Payer: Medicare Other | Admitting: *Deleted

## 2015-02-11 DIAGNOSIS — Z5181 Encounter for therapeutic drug level monitoring: Secondary | ICD-10-CM | POA: Diagnosis not present

## 2015-02-11 DIAGNOSIS — I4891 Unspecified atrial fibrillation: Secondary | ICD-10-CM | POA: Diagnosis not present

## 2015-02-11 LAB — POCT INR: INR: 1.5

## 2015-02-15 ENCOUNTER — Encounter: Payer: Self-pay | Admitting: Internal Medicine

## 2015-02-18 ENCOUNTER — Other Ambulatory Visit: Payer: Self-pay | Admitting: Internal Medicine

## 2015-02-24 ENCOUNTER — Other Ambulatory Visit (INDEPENDENT_AMBULATORY_CARE_PROVIDER_SITE_OTHER): Payer: Medicare Other | Admitting: *Deleted

## 2015-02-24 ENCOUNTER — Ambulatory Visit (INDEPENDENT_AMBULATORY_CARE_PROVIDER_SITE_OTHER): Payer: Medicare Other | Admitting: Physician Assistant

## 2015-02-24 ENCOUNTER — Ambulatory Visit (INDEPENDENT_AMBULATORY_CARE_PROVIDER_SITE_OTHER): Payer: Medicare Other | Admitting: *Deleted

## 2015-02-24 ENCOUNTER — Encounter: Payer: Self-pay | Admitting: Physician Assistant

## 2015-02-24 VITALS — BP 140/70 | HR 70 | Ht 59.0 in | Wt 88.0 lb

## 2015-02-24 DIAGNOSIS — I5032 Chronic diastolic (congestive) heart failure: Secondary | ICD-10-CM | POA: Diagnosis not present

## 2015-02-24 DIAGNOSIS — Z5181 Encounter for therapeutic drug level monitoring: Secondary | ICD-10-CM

## 2015-02-24 DIAGNOSIS — I482 Chronic atrial fibrillation, unspecified: Secondary | ICD-10-CM

## 2015-02-24 DIAGNOSIS — I10 Essential (primary) hypertension: Secondary | ICD-10-CM | POA: Diagnosis not present

## 2015-02-24 DIAGNOSIS — Z950 Presence of cardiac pacemaker: Secondary | ICD-10-CM | POA: Diagnosis not present

## 2015-02-24 DIAGNOSIS — I4891 Unspecified atrial fibrillation: Secondary | ICD-10-CM | POA: Diagnosis not present

## 2015-02-24 LAB — POCT INR: INR: 1.8

## 2015-02-24 NOTE — Patient Instructions (Signed)
Medication Instructions:  Your physician recommends that you continue on your current medications as directed. Please refer to the Current Medication list given to you today.   Labwork: BMET TODAY   Testing/Procedures: NONE TODAY   Follow-Up: SEE DR [name] AS PLANNED   Any Other Special Instructions Will Be Listed Below (If Applicable).

## 2015-02-24 NOTE — Progress Notes (Signed)
Cardiology Office Note   Date:  02/24/2015   ID:  [name] 16-Nov-1918, MRN [id number]  PCP:  Stephanie Grant, MD  Cardiologist:  Dr. Virl Curry     Chief Complaint  Patient presents with  . Congestive Heart Failure     History of Present Illness: [name] is a 81 y.o. female with a hx of chronic AFib, s/p AVN ablation and PPM, diastolic HF, HTN, non-obstructive CAD by LHC in 1997.  She was seen by Dr. Virl Curry 3/22 and felt to be volume overloaded.  Lasix was increased for diuresis.  She had some myalgias. ESR was not significantly elevated.  She returns for FU.  She is feeling better. LE edema is improved.  Weight is down 7 lbs.  She denies further chest fullness. No syncope.  No orthopnea, PND.  No cough or wheezing.   Studies/Reports Reviewed Today:  Carotid US 9/13 Bilateral ICA 0-39%  Echo 11/2010 Mild LVH, vigorous LVF, EF 65-70%, no RWMA Mild AS, Mean 15 mmHg Mod MR Mod to severe LAE Mod RAE Mod TR Mod PI PASP 60 mmHg   Past Medical History  Diagnosis Date  . CAD (coronary artery disease)   . Ostium secundum type atrial septal defect   . AV block     s/p PPM  . Venous insufficiency   . Pure hypercholesterolemia   . Diverticulosis of colon (without mention of hemorrhage)   . Overactive bladder   . DJD (degenerative joint disease)   . LBP (low back pain)   . Chronic pain syndrome   . Osteoporosis     pelvic fx 10/2013 s/p fall  . Anemia     Past Surgical History  Procedure Laterality Date  . Vesicovaginal fistula closure w/ tah  1973  . Cataract extraction    . Asd repair    . Pacemaker placement  [place]  . Inguinal hernia repair  2011    bilaterally  . Shoulder surgery      x 2  . Ganglion cyst excision    . Partial hysterectomy       Current Outpatient Prescriptions  Medication Sig Dispense Refill  . acetaminophen (TYLENOL) 500 MG tablet Take 650 mg by mouth every 8 (eight) hours as needed. Take with  tramadol as needed for pain    . Ascorbic Acid (VITAMIN C) 1000 MG tablet Take 1,000 mg by mouth daily.      . Biotin 1000 MCG tablet Take 1,000 mcg by mouth daily.      . Calcium Carbonate-Vitamin D (CALCIUM 600+D) 600-400 MG-UNIT per tablet Take 2 tablets by mouth daily.      . Cholecalciferol (VITAMIN D) 1000 UNITS capsule Take 1,000 Units by mouth daily.      . ferrous sulfate 325 (65 FE) MG tablet Take 325 mg by mouth daily with breakfast.     . fluticasone (FLONASE) 50 MCG/ACT nasal spray Place 2 sprays into the nose daily as needed for allergies.     . furosemide (LASIX) 40 MG tablet TAKE 1 TABLET DAILY OR AS DIRECTED 90 tablet 1  . Glucosamine 500 MG CAPS Take 500 mg by mouth daily.     . Multiple Vitamins-Minerals (PRESERVISION AREDS) TABS Take 1 tablet by mouth daily.    . Omega-3 350 MG CAPS Take 1 capsule by mouth every evening.     Marland Kitchen omeprazole (PRILOSEC) 20 MG capsule Take 20 mg by mouth daily as needed (  acid reflux).     . potassium chloride (K-DUR) 10 MEQ tablet TAKE 1 TABLET TWICE A DAY AND AS NEEDED WHEN TAKING EXTRA FUROSEMIDE 270 tablet 3  . tolterodine (DETROL LA) 2 MG 24 hr capsule Take 2 mg by mouth 2 (two) times daily.      . traMADol (ULTRAM) 50 MG tablet Take 1 tablet (50 mg total) by mouth every 6 (six) hours as needed for moderate pain. 30 tablet 0  . vitamin A 8000 UNIT capsule Take 8,000 Units by mouth every other day.      . vitamin E 400 UNIT capsule Take 400 Units by mouth daily.      Marland Kitchen warfarin (COUMADIN) 5 MG tablet TAKE AS DIRECTED BY COUMADIN CLINIC 90 tablet 1   No current facility-administered medications for this visit.    Allergies:   Amiodarone hcl; Codeine; Fentanyl; and Lamisil    Social History:  The patient  reports that she has never smoked. She has never used smokeless tobacco. She reports that she does not drink alcohol or use illicit drugs.   Family History:  The patient's family history includes Breast cancer in her sister; Emphysema in  her father; Heart disease in her mother; Heart failure in her mother. There is no history of Heart attack or Stroke.    ROS:   Please see the history of present illness.   Review of Systems  Eyes: Positive for visual disturbance.  Hematologic/Lymphatic: Bruises/bleeds easily.  Musculoskeletal: Positive for back pain, joint pain and myalgias.  Gastrointestinal: Positive for constipation.  All other systems reviewed and are negative.    PHYSICAL EXAM: VS:  BP 140/70 mmHg  Pulse 70  Ht '4\' 11"'  (1.499 m)  Wt 88 lb (39.917 kg)  BMI 17.76 kg/m2  LMP  (LMP Unknown)    Wt Readings from Last 3 Encounters:  02/24/15 88 lb (39.917 kg)  02/01/15 95 lb (43.092 kg)  12/28/14 93 lb 0.8 oz (42.207 kg)     GEN: Well nourished, well developed, in no acute distress HEENT: normal Neck: no JVD, no masses Cardiac:  Normal S1/S2, RRR; no murmur ,  no rubs or gallops, trace edema  Respiratory:  Decreased breath sounds bilaterally, dry bibasilar crackles, no wheezing, rhonchi  GI: soft, nontender, nondistended, + BS MS: no deformity or atrophy Skin: warm and dry  Neuro:  CNs II-XII intact, Strength and sensation are intact Psych: Normal affect   EKG:  EKG is ordered today.  It demonstrates:   V paced HR 70   Recent Labs: 06/14/2014: TSH 0.62 12/28/2014: BUN 18; Creatinine 0.76; Hemoglobin 11.4*; Platelets 168.0; Potassium 4.9; Sodium 137    Lipid Panel    Component Value Date/Time   CHOL 154 06/14/2014 1243   TRIG 84.0 06/14/2014 1243   HDL 53.50 06/14/2014 1243   CHOLHDL 3 06/14/2014 1243   VLDL 16.8 06/14/2014 1243   LDLCALC 84 06/14/2014 1243      ASSESSMENT AND PLAN:  Chronic diastolic heart failure  Volume improved. Continue current Rx.  Check BMET today.  Chronic atrial fibrillation Rate controlled.  Continue Coumadin which she is tolerating.  Pacemaker FU with EP as planned.  Essential hypertension  Controlled.    Current medicines are reviewed at length with the  patient today.  The patient does not have concerns regarding medicines.  The following changes have been made:  no change  Labs/ tests ordered today include:  Orders Placed This Encounter  Procedures  . EKG 12-Lead  Disposition:   FU with Dr. Virl Curry as planned.    Signed, Versie Starks, MHS 02/24/2015 4:41 PM    [facility] [address], [place], [place]  [address] Phone: [phone]; Fax: [phone]

## 2015-02-25 LAB — BASIC METABOLIC PANEL
BUN: 23 mg/dL (ref 6–23)
CALCIUM: 10 mg/dL (ref 8.4–10.5)
CO2: 30 meq/L (ref 19–32)
CREATININE: 0.77 mg/dL (ref 0.40–1.20)
Chloride: 99 mEq/L (ref 96–112)
GFR: 74.18 mL/min (ref >60.00)
Glucose, Bld: 82 mg/dL (ref 70–99)
Potassium: 4.9 mEq/L (ref 3.5–5.1)
SODIUM: 134 meq/L — AB (ref 135–145)

## 2015-03-18 ENCOUNTER — Ambulatory Visit (INDEPENDENT_AMBULATORY_CARE_PROVIDER_SITE_OTHER): Payer: Medicare Other | Admitting: *Deleted

## 2015-03-18 DIAGNOSIS — Z5181 Encounter for therapeutic drug level monitoring: Secondary | ICD-10-CM

## 2015-03-18 DIAGNOSIS — I4891 Unspecified atrial fibrillation: Secondary | ICD-10-CM

## 2015-03-18 LAB — POCT INR: INR: 1.9

## 2015-04-15 ENCOUNTER — Ambulatory Visit (INDEPENDENT_AMBULATORY_CARE_PROVIDER_SITE_OTHER): Payer: Medicare Other | Admitting: *Deleted

## 2015-04-15 DIAGNOSIS — I4891 Unspecified atrial fibrillation: Secondary | ICD-10-CM | POA: Diagnosis not present

## 2015-04-15 DIAGNOSIS — Z5181 Encounter for therapeutic drug level monitoring: Secondary | ICD-10-CM

## 2015-04-15 LAB — POCT INR: INR: 1.9

## 2015-04-21 ENCOUNTER — Other Ambulatory Visit: Payer: Self-pay | Admitting: Internal Medicine

## 2015-04-22 NOTE — Telephone Encounter (Signed)
Per note 4.14.16

## 2015-04-25 DIAGNOSIS — N3281 Overactive bladder: Secondary | ICD-10-CM | POA: Diagnosis not present

## 2015-04-25 DIAGNOSIS — D649 Anemia, unspecified: Secondary | ICD-10-CM | POA: Diagnosis not present

## 2015-04-25 DIAGNOSIS — Z1389 Encounter for screening for other disorder: Secondary | ICD-10-CM | POA: Diagnosis not present

## 2015-04-25 DIAGNOSIS — Z01419 Encounter for gynecological examination (general) (routine) without abnormal findings: Secondary | ICD-10-CM | POA: Diagnosis not present

## 2015-04-26 LAB — CBC AND DIFFERENTIAL
HCT: 35 % — AB (ref 36–46)
Hemoglobin: 11.8 g/dL — AB (ref 12.0–16.0)
NEUTROS ABS: 3 /uL
Platelets: 155 10*3/uL (ref 150–399)
WBC: 5.2 10^3/mL

## 2015-04-28 ENCOUNTER — Encounter: Payer: Self-pay | Admitting: Internal Medicine

## 2015-05-04 DIAGNOSIS — I495 Sick sinus syndrome: Secondary | ICD-10-CM | POA: Diagnosis not present

## 2015-05-04 DIAGNOSIS — I482 Chronic atrial fibrillation: Secondary | ICD-10-CM | POA: Diagnosis not present

## 2015-05-04 DIAGNOSIS — Z950 Presence of cardiac pacemaker: Secondary | ICD-10-CM | POA: Diagnosis not present

## 2015-05-09 DIAGNOSIS — M5442 Lumbago with sciatica, left side: Secondary | ICD-10-CM | POA: Diagnosis not present

## 2015-05-09 DIAGNOSIS — M5441 Lumbago with sciatica, right side: Secondary | ICD-10-CM | POA: Diagnosis not present

## 2015-05-13 ENCOUNTER — Telehealth: Payer: Self-pay | Admitting: Internal Medicine

## 2015-05-13 NOTE — Telephone Encounter (Signed)
I would bridge :((  But would welcome input

## 2015-05-13 NOTE — Telephone Encounter (Signed)
Pt has a CHADS score of 5 including history of stroke.  Per protocol, would qualify for Lovenox bridge but given her advanced age, will check with Dr. [name].  [name] with Dr. Nelva Bush office.  Explained we would not be able to get patient ready to stop Coumadin today this late in the afternoon.  She will reschedule the procedure until the 15th.

## 2015-05-13 NOTE — Telephone Encounter (Signed)
New message      Request for surgical clearance:  1. What type of surgery is being performed? Epidural Steroid Injection  2. When is this surgery scheduled? July 8  3. Are there any medications that need to be held prior to surgery and how long?Coumadin; Pt needs to stop today for procedure   4. Name of physician performing surgery? Dr. Suella Broad  5. What is your office phone and fax number? Ofc [phone] FAx [phone]  Please call and advise.  Pt needs to stop coumadin today

## 2015-05-17 NOTE — Telephone Encounter (Signed)
Spoke with pt's daughter.  Appt made for 7/6 to go over Lovenox instructions.  Injection is now scheduled for 7/15.

## 2015-05-18 ENCOUNTER — Ambulatory Visit (INDEPENDENT_AMBULATORY_CARE_PROVIDER_SITE_OTHER): Payer: Medicare Other | Admitting: *Deleted

## 2015-05-18 DIAGNOSIS — I4891 Unspecified atrial fibrillation: Secondary | ICD-10-CM

## 2015-05-18 DIAGNOSIS — Z5181 Encounter for therapeutic drug level monitoring: Secondary | ICD-10-CM | POA: Diagnosis not present

## 2015-05-18 LAB — POCT INR: INR: 2.1

## 2015-05-18 MED ORDER — ENOXAPARIN SODIUM 40 MG/0.4ML ~~LOC~~ SOLN: 40.0000 mg | SUBCUTANEOUS | Status: DC

## 2015-05-18 NOTE — Patient Instructions (Signed)
05/21/15 Last dose of Coumadin  05/22/15 No Lovenox or Coumadin  05/23/15 Start taking Lovenox 40mg  at 8am into the fatty abdominal tissue 2 inches away from the navel, rotate sites daily  05/24/15 Continue taking Lovenox 40mg  at 8am into the fatty abdominal tissue 2 inches away from the navel, rotate sites daily  05/25/15  Continue taking Lovenox 40mg  at 8am into the fatty abdominal tissue 2 inches away from the navel, rotate sites daily  05/26/15  Continue taking Lovenox 40mg  at 8am into the fatty abdominal tissue 2 inches away from the navel, rotate sites daily  05/27/15  Procedure day  Resume Coumadin and Lovenox when instructed to do so by Dr. Nelva Bush.  You will resume at normal dose and continue both Lovenox and Coumadin until next appointment with Anticoagulation Clinic.  Call with any questions [phone]

## 2015-05-24 ENCOUNTER — Other Ambulatory Visit: Payer: Self-pay | Admitting: Internal Medicine

## 2015-05-26 ENCOUNTER — Ambulatory Visit (INDEPENDENT_AMBULATORY_CARE_PROVIDER_SITE_OTHER): Payer: Medicare Other | Admitting: *Deleted

## 2015-05-26 DIAGNOSIS — Z5181 Encounter for therapeutic drug level monitoring: Secondary | ICD-10-CM

## 2015-05-26 DIAGNOSIS — I4891 Unspecified atrial fibrillation: Secondary | ICD-10-CM | POA: Diagnosis not present

## 2015-05-26 LAB — POCT INR: INR: 1.1

## 2015-05-27 DIAGNOSIS — M4806 Spinal stenosis, lumbar region: Secondary | ICD-10-CM | POA: Diagnosis not present

## 2015-05-27 DIAGNOSIS — M5416 Radiculopathy, lumbar region: Secondary | ICD-10-CM | POA: Diagnosis not present

## 2015-05-27 DIAGNOSIS — M4807 Spinal stenosis, lumbosacral region: Secondary | ICD-10-CM | POA: Diagnosis not present

## 2015-06-02 ENCOUNTER — Ambulatory Visit (INDEPENDENT_AMBULATORY_CARE_PROVIDER_SITE_OTHER): Payer: Medicare Other

## 2015-06-02 DIAGNOSIS — Z5181 Encounter for therapeutic drug level monitoring: Secondary | ICD-10-CM | POA: Diagnosis not present

## 2015-06-02 DIAGNOSIS — I4891 Unspecified atrial fibrillation: Secondary | ICD-10-CM | POA: Diagnosis not present

## 2015-06-02 LAB — POCT INR: INR: 1.3

## 2015-06-06 ENCOUNTER — Ambulatory Visit (INDEPENDENT_AMBULATORY_CARE_PROVIDER_SITE_OTHER): Payer: Medicare Other | Admitting: *Deleted

## 2015-06-06 DIAGNOSIS — I4891 Unspecified atrial fibrillation: Secondary | ICD-10-CM | POA: Diagnosis not present

## 2015-06-06 DIAGNOSIS — Z5181 Encounter for therapeutic drug level monitoring: Secondary | ICD-10-CM

## 2015-06-06 LAB — POCT INR: INR: 1.9

## 2015-06-07 ENCOUNTER — Other Ambulatory Visit: Payer: Self-pay

## 2015-06-07 DIAGNOSIS — Z1231 Encounter for screening mammogram for malignant neoplasm of breast: Secondary | ICD-10-CM

## 2015-06-08 DIAGNOSIS — L821 Other seborrheic keratosis: Secondary | ICD-10-CM | POA: Diagnosis not present

## 2015-06-08 DIAGNOSIS — D1801 Hemangioma of skin and subcutaneous tissue: Secondary | ICD-10-CM | POA: Diagnosis not present

## 2015-06-08 DIAGNOSIS — L218 Other seborrheic dermatitis: Secondary | ICD-10-CM | POA: Diagnosis not present

## 2015-06-13 ENCOUNTER — Ambulatory Visit (INDEPENDENT_AMBULATORY_CARE_PROVIDER_SITE_OTHER): Payer: Medicare Other | Admitting: *Deleted

## 2015-06-13 DIAGNOSIS — Z5181 Encounter for therapeutic drug level monitoring: Secondary | ICD-10-CM

## 2015-06-13 DIAGNOSIS — I4891 Unspecified atrial fibrillation: Secondary | ICD-10-CM | POA: Diagnosis not present

## 2015-06-13 LAB — POCT INR: INR: 1.5

## 2015-06-23 ENCOUNTER — Ambulatory Visit (INDEPENDENT_AMBULATORY_CARE_PROVIDER_SITE_OTHER): Payer: Medicare Other

## 2015-06-23 DIAGNOSIS — Z961 Presence of intraocular lens: Secondary | ICD-10-CM | POA: Diagnosis not present

## 2015-06-23 DIAGNOSIS — H3531 Nonexudative age-related macular degeneration: Secondary | ICD-10-CM | POA: Diagnosis not present

## 2015-06-23 DIAGNOSIS — Z5181 Encounter for therapeutic drug level monitoring: Secondary | ICD-10-CM

## 2015-06-23 DIAGNOSIS — I4891 Unspecified atrial fibrillation: Secondary | ICD-10-CM | POA: Diagnosis not present

## 2015-06-23 DIAGNOSIS — H04123 Dry eye syndrome of bilateral lacrimal glands: Secondary | ICD-10-CM | POA: Diagnosis not present

## 2015-06-23 LAB — POCT INR: INR: 1.7

## 2015-06-27 ENCOUNTER — Ambulatory Visit
Admission: RE | Admit: 2015-06-27 | Discharge: 2015-06-27 | Disposition: A | Discharge status: Home / Self Care | Payer: Medicare Other | Source: Ambulatory Visit

## 2015-06-27 DIAGNOSIS — Z1231 Encounter for screening mammogram for malignant neoplasm of breast: Secondary | ICD-10-CM | POA: Diagnosis not present

## 2015-07-08 ENCOUNTER — Ambulatory Visit (INDEPENDENT_AMBULATORY_CARE_PROVIDER_SITE_OTHER): Payer: Medicare Other | Admitting: *Deleted

## 2015-07-08 DIAGNOSIS — Z5181 Encounter for therapeutic drug level monitoring: Secondary | ICD-10-CM

## 2015-07-08 DIAGNOSIS — I4891 Unspecified atrial fibrillation: Secondary | ICD-10-CM

## 2015-07-08 LAB — POCT INR: INR: 1.4

## 2015-07-22 ENCOUNTER — Ambulatory Visit (INDEPENDENT_AMBULATORY_CARE_PROVIDER_SITE_OTHER): Payer: Medicare Other | Admitting: *Deleted

## 2015-07-22 DIAGNOSIS — Z5181 Encounter for therapeutic drug level monitoring: Secondary | ICD-10-CM

## 2015-07-22 DIAGNOSIS — I4891 Unspecified atrial fibrillation: Secondary | ICD-10-CM | POA: Diagnosis not present

## 2015-07-22 LAB — POCT INR: INR: 2

## 2015-08-03 ENCOUNTER — Telehealth: Payer: Self-pay | Admitting: Internal Medicine

## 2015-08-03 DIAGNOSIS — I482 Chronic atrial fibrillation: Secondary | ICD-10-CM | POA: Diagnosis not present

## 2015-08-03 DIAGNOSIS — Z950 Presence of cardiac pacemaker: Secondary | ICD-10-CM | POA: Diagnosis not present

## 2015-08-03 DIAGNOSIS — I495 Sick sinus syndrome: Secondary | ICD-10-CM | POA: Diagnosis not present

## 2015-08-03 NOTE — Telephone Encounter (Signed)
New Message  Pt requested to speak w/ Device concerning appt on 9/26 for pcr check. Pt stated she did not know why she has appt, since she had a remote transmissino today 9/21. Pt has no previous appt for 9/21. Please call back and discuss.

## 2015-08-04 NOTE — Telephone Encounter (Signed)
Spoke w/ pt and informed her that her phone checks only check your battery and that she has to come into the office every 6 months to have all other functions of the PPM checked. Informed pt that she should have been told about September appt at her March appt with MD. Pt verbalized understanding and will keep appt.

## 2015-08-08 ENCOUNTER — Ambulatory Visit (INDEPENDENT_AMBULATORY_CARE_PROVIDER_SITE_OTHER): Payer: Medicare Other | Admitting: *Deleted

## 2015-08-08 DIAGNOSIS — Z950 Presence of cardiac pacemaker: Secondary | ICD-10-CM | POA: Diagnosis not present

## 2015-08-08 DIAGNOSIS — I482 Chronic atrial fibrillation, unspecified: Secondary | ICD-10-CM

## 2015-08-08 DIAGNOSIS — I442 Atrioventricular block, complete: Secondary | ICD-10-CM

## 2015-08-08 LAB — CUP PACEART INCLINIC DEVICE CHECK
Battery Impedance: 1400 Ohm
Battery Voltage: 2.76 V
Lead Channel Impedance Value: 459 Ohm
Lead Channel Pacing Threshold Pulse Width: 0.4 ms
MDC IDC MSMT LEADCHNL RV PACING THRESHOLD AMPLITUDE: 1.25 V
MDC IDC PG SERIAL: 2201046
MDC IDC SESS DTM: 20160926170042
MDC IDC SET LEADCHNL RV PACING PULSEWIDTH: 0.4 ms
MDC IDC SET LEADCHNL RV SENSING SENSITIVITY: 2 mV
MDC IDC STAT BRADY RV PERCENT PACED: 99 % — AB

## 2015-08-08 NOTE — Progress Notes (Signed)
Pacemaker check in clinic. Normal device function. Threshold, impedances consistent with previous measurements. Device programmed to maximize longevity. Device programmed at appropriate safety margins. Histogram distribution appropriate for patient activity level. Device programmed to optimize intrinsic conduction. Estimated longevity 6.5-7.72yrs. ROV w/ SK in 30mo.

## 2015-08-12 ENCOUNTER — Ambulatory Visit (INDEPENDENT_AMBULATORY_CARE_PROVIDER_SITE_OTHER): Payer: Medicare Other

## 2015-08-12 DIAGNOSIS — Z5181 Encounter for therapeutic drug level monitoring: Secondary | ICD-10-CM | POA: Diagnosis not present

## 2015-08-12 DIAGNOSIS — I4891 Unspecified atrial fibrillation: Secondary | ICD-10-CM | POA: Diagnosis not present

## 2015-08-12 LAB — POCT INR: INR: 1.5

## 2015-08-16 ENCOUNTER — Other Ambulatory Visit: Payer: Self-pay | Admitting: Internal Medicine

## 2015-08-21 ENCOUNTER — Other Ambulatory Visit: Payer: Self-pay | Admitting: Internal Medicine

## 2015-09-02 ENCOUNTER — Encounter: Payer: Self-pay | Admitting: Internal Medicine

## 2015-09-02 ENCOUNTER — Ambulatory Visit (INDEPENDENT_AMBULATORY_CARE_PROVIDER_SITE_OTHER): Payer: Medicare Other | Admitting: *Deleted

## 2015-09-02 DIAGNOSIS — I4891 Unspecified atrial fibrillation: Secondary | ICD-10-CM | POA: Diagnosis not present

## 2015-09-02 DIAGNOSIS — Z23 Encounter for immunization: Secondary | ICD-10-CM | POA: Diagnosis not present

## 2015-09-02 DIAGNOSIS — Z5181 Encounter for therapeutic drug level monitoring: Secondary | ICD-10-CM | POA: Diagnosis not present

## 2015-09-02 LAB — POCT INR: INR: 1.6

## 2015-09-16 ENCOUNTER — Ambulatory Visit (INDEPENDENT_AMBULATORY_CARE_PROVIDER_SITE_OTHER): Payer: Medicare Other | Admitting: *Deleted

## 2015-09-16 DIAGNOSIS — Z5181 Encounter for therapeutic drug level monitoring: Secondary | ICD-10-CM

## 2015-09-16 DIAGNOSIS — I4891 Unspecified atrial fibrillation: Secondary | ICD-10-CM

## 2015-09-16 LAB — POCT INR: INR: 1.8

## 2015-09-29 ENCOUNTER — Telehealth: Payer: Self-pay | Admitting: Internal Medicine

## 2015-09-29 NOTE — Telephone Encounter (Signed)
Is there a slot to get pt in tomorrow; (if pt dtr is okay).

## 2015-09-29 NOTE — Telephone Encounter (Signed)
Patient Name: ETRULIA CO  DOB: Aug 05, 1919    Initial Comment Caller states her mother injured her leg hitting it against her walker, has blood blister, skin tear and leg swelling not reduced with heat and ice, has appt Monday   Nurse Assessment  Nurse: Raphael Gibney, RN, Vanita Ingles Date/Time Eilene Ghazi Time): 09/29/2015 2:08:38 PM  Confirm and document reason for call. If symptomatic, describe symptoms. ---Caller states her mother is on Coumadin. A magazine fell on her leg and cut her leg. She has been cleaning it with H2O2 and antibiotic oint. It is healing. 2 weeks ago, she was going out the door and hit her right leg and caused a blood blister on the calf. She went to the [facility] that day and was instructed to use ice and heat. She has bumped the area and caused it to bleed 3-4 days ago. She has bandaged it and used neosporin oint. Area is still puffy and swollen. Has appt on Monday at 4 pm. Area is leaking a little blood.  Has the patient traveled out of the country within the last 30 days? ---Not Applicable  Does the patient have any new or worsening symptoms? ---Yes  Will a triage be completed? ---Yes  Related visit to physician within the last 2 weeks? ---Yes  Does the PT have any chronic conditions? (i.e. diabetes, asthma, etc.) ---Yes  List chronic conditions. ---on Coumadin; heart problems  Is this a behavioral health call? ---No     Guidelines    Guideline Title Affirmed Question Affirmed Notes  Bruises Taking Coumadin (warfarin) or other strong blood thinner, or known bleeding disorder (e.g., thrombocytopenia)    Final Disposition User   See Physician within 24 Hours [place], RN, Vera    Comments  No appts available at [facility]. Pt has one scheduled at 4 pm on 10/03/2015 already.      Daughter has already taken her to the [facility] who said they do not drain any bruises and does not want to take her back to the urgent care.   Referrals  GO TO FACILITY REFUSED    Disagree/Comply: Disagree  Disagree/Comply Reason: Disagree with instructions

## 2015-09-30 ENCOUNTER — Inpatient Hospital Stay (HOSPITAL_COMMUNITY)
Admission: EM | Admit: 2015-09-30 | Discharge: 2015-10-01 | DRG: 605 | Disposition: A | Discharge status: Home / Self Care | Payer: Medicare Other | Attending: Internal Medicine | Admitting: Internal Medicine

## 2015-09-30 ENCOUNTER — Inpatient Hospital Stay (HOSPITAL_COMMUNITY): Payer: Medicare Other

## 2015-09-30 ENCOUNTER — Telehealth: Payer: Self-pay | Admitting: Internal Medicine

## 2015-09-30 ENCOUNTER — Emergency Department (HOSPITAL_COMMUNITY): Payer: Medicare Other

## 2015-09-30 ENCOUNTER — Encounter (HOSPITAL_COMMUNITY): Payer: Self-pay

## 2015-09-30 DIAGNOSIS — R0981 Nasal congestion: Secondary | ICD-10-CM | POA: Diagnosis present

## 2015-09-30 DIAGNOSIS — M1990 Unspecified osteoarthritis, unspecified site: Secondary | ICD-10-CM | POA: Diagnosis present

## 2015-09-30 DIAGNOSIS — Z8249 Family history of ischemic heart disease and other diseases of the circulatory system: Secondary | ICD-10-CM | POA: Diagnosis not present

## 2015-09-30 DIAGNOSIS — Z90711 Acquired absence of uterus with remaining cervical stump: Secondary | ICD-10-CM

## 2015-09-30 DIAGNOSIS — I2510 Atherosclerotic heart disease of native coronary artery without angina pectoris: Secondary | ICD-10-CM | POA: Diagnosis present

## 2015-09-30 DIAGNOSIS — Z888 Allergy status to other drugs, medicaments and biological substances status: Secondary | ICD-10-CM | POA: Diagnosis not present

## 2015-09-30 DIAGNOSIS — Z825 Family history of asthma and other chronic lower respiratory diseases: Secondary | ICD-10-CM | POA: Diagnosis not present

## 2015-09-30 DIAGNOSIS — Z79899 Other long term (current) drug therapy: Secondary | ICD-10-CM | POA: Diagnosis not present

## 2015-09-30 DIAGNOSIS — I482 Chronic atrial fibrillation: Secondary | ICD-10-CM | POA: Diagnosis present

## 2015-09-30 DIAGNOSIS — D649 Anemia, unspecified: Secondary | ICD-10-CM | POA: Diagnosis present

## 2015-09-30 DIAGNOSIS — I5032 Chronic diastolic (congestive) heart failure: Secondary | ICD-10-CM | POA: Diagnosis not present

## 2015-09-30 DIAGNOSIS — I442 Atrioventricular block, complete: Secondary | ICD-10-CM | POA: Diagnosis not present

## 2015-09-30 DIAGNOSIS — Z803 Family history of malignant neoplasm of breast: Secondary | ICD-10-CM

## 2015-09-30 DIAGNOSIS — R791 Abnormal coagulation profile: Secondary | ICD-10-CM | POA: Diagnosis present

## 2015-09-30 DIAGNOSIS — Z9849 Cataract extraction status, unspecified eye: Secondary | ICD-10-CM

## 2015-09-30 DIAGNOSIS — L97919 Non-pressure chronic ulcer of unspecified part of right lower leg with unspecified severity: Secondary | ICD-10-CM | POA: Diagnosis present

## 2015-09-30 DIAGNOSIS — I110 Hypertensive heart disease with heart failure: Secondary | ICD-10-CM | POA: Diagnosis present

## 2015-09-30 DIAGNOSIS — Z9889 Other specified postprocedural states: Secondary | ICD-10-CM | POA: Diagnosis not present

## 2015-09-30 DIAGNOSIS — Z7901 Long term (current) use of anticoagulants: Secondary | ICD-10-CM | POA: Diagnosis not present

## 2015-09-30 DIAGNOSIS — R059 Cough, unspecified: Secondary | ICD-10-CM | POA: Insufficient documentation

## 2015-09-30 DIAGNOSIS — Z7951 Long term (current) use of inhaled steroids: Secondary | ICD-10-CM | POA: Diagnosis not present

## 2015-09-30 DIAGNOSIS — T45515A Adverse effect of anticoagulants, initial encounter: Secondary | ICD-10-CM | POA: Diagnosis present

## 2015-09-30 DIAGNOSIS — N3281 Overactive bladder: Secondary | ICD-10-CM | POA: Diagnosis present

## 2015-09-30 DIAGNOSIS — T1490 Injury, unspecified: Secondary | ICD-10-CM

## 2015-09-30 DIAGNOSIS — Z886 Allergy status to analgesic agent status: Secondary | ICD-10-CM

## 2015-09-30 DIAGNOSIS — W19XXXA Unspecified fall, initial encounter: Secondary | ICD-10-CM | POA: Diagnosis present

## 2015-09-30 DIAGNOSIS — Z950 Presence of cardiac pacemaker: Secondary | ICD-10-CM | POA: Diagnosis not present

## 2015-09-30 DIAGNOSIS — S8010XA Contusion of unspecified lower leg, initial encounter: Secondary | ICD-10-CM | POA: Diagnosis present

## 2015-09-30 DIAGNOSIS — G894 Chronic pain syndrome: Secondary | ICD-10-CM | POA: Diagnosis present

## 2015-09-30 DIAGNOSIS — Z66 Do not resuscitate: Secondary | ICD-10-CM | POA: Diagnosis present

## 2015-09-30 DIAGNOSIS — S7011XA Contusion of right thigh, initial encounter: Principal | ICD-10-CM | POA: Diagnosis present

## 2015-09-30 DIAGNOSIS — I872 Venous insufficiency (chronic) (peripheral): Secondary | ICD-10-CM | POA: Diagnosis present

## 2015-09-30 DIAGNOSIS — I4821 Permanent atrial fibrillation: Secondary | ICD-10-CM | POA: Diagnosis present

## 2015-09-30 DIAGNOSIS — M810 Age-related osteoporosis without current pathological fracture: Secondary | ICD-10-CM | POA: Diagnosis present

## 2015-09-30 DIAGNOSIS — R05 Cough: Secondary | ICD-10-CM | POA: Diagnosis present

## 2015-09-30 DIAGNOSIS — S8011XA Contusion of right lower leg, initial encounter: Secondary | ICD-10-CM | POA: Diagnosis not present

## 2015-09-30 DIAGNOSIS — T148XXA Other injury of unspecified body region, initial encounter: Secondary | ICD-10-CM

## 2015-09-30 LAB — EXPECTORATED SPUTUM ASSESSMENT W REFEX TO RESP CULTURE

## 2015-09-30 LAB — BASIC METABOLIC PANEL
ANION GAP: 7 (ref 5–15)
BUN: 26 mg/dL — AB (ref 6–20)
CHLORIDE: 102 mmol/L (ref 101–111)
CO2: 28 mmol/L (ref 22–32)
Calcium: 9.9 mg/dL (ref 8.9–10.3)
Creatinine, Ser: 0.83 mg/dL (ref 0.44–1.00)
GFR calc Af Amer: >60 mL/min (ref >60)
GFR calc non Af Amer: 59 mL/min — ABNORMAL LOW (ref >60)
Glucose, Bld: 92 mg/dL (ref 65–99)
POTASSIUM: 4.4 mmol/L (ref 3.5–5.1)
SODIUM: 137 mmol/L (ref 135–145)

## 2015-09-30 LAB — EXPECTORATED SPUTUM ASSESSMENT W GRAM STAIN, RFLX TO RESP C

## 2015-09-30 LAB — CBC WITH DIFFERENTIAL/PLATELET
BASOS ABS: 0 10*3/uL (ref 0.0–0.1)
BASOS ABS: 0 10*3/uL (ref 0.0–0.1)
BASOS PCT: 0 %
Basophils Relative: 0 %
EOS ABS: 0.2 10*3/uL (ref 0.0–0.7)
EOS ABS: 0.1 10*3/uL (ref 0.0–0.7)
Eosinophils Relative: 3 %
Eosinophils Relative: 2 %
HCT: 31.8 % — ABNORMAL LOW (ref 36.0–46.0)
HCT: 32.5 % — ABNORMAL LOW (ref 36.0–46.0)
HEMOGLOBIN: 10.5 g/dL — AB (ref 12.0–15.0)
HEMOGLOBIN: 10.7 g/dL — AB (ref 12.0–15.0)
LYMPHS ABS: 1.3 10*3/uL (ref 0.7–4.0)
Lymphocytes Relative: 25 %
Lymphocytes Relative: 29 %
Lymphs Abs: 1.5 10*3/uL (ref 0.7–4.0)
MCH: 35.7 pg — AB (ref 26.0–34.0)
MCH: 35.5 pg — ABNORMAL HIGH (ref 26.0–34.0)
MCHC: 33 g/dL (ref 30.0–36.0)
MCHC: 32.9 g/dL (ref 30.0–36.0)
MCV: 108.2 fL — ABNORMAL HIGH (ref 78.0–100.0)
MCV: 108 fL — ABNORMAL HIGH (ref 78.0–100.0)
MONOS PCT: 13 %
Monocytes Absolute: 0.5 10*3/uL (ref 0.1–1.0)
Monocytes Absolute: 0.7 10*3/uL (ref 0.1–1.0)
Monocytes Relative: 10 %
NEUTROS ABS: 3.3 10*3/uL (ref 1.7–7.7)
NEUTROS ABS: 2.8 10*3/uL (ref 1.7–7.7)
NEUTROS PCT: 62 %
NEUTROS PCT: 56 %
Platelets: 143 10*3/uL — ABNORMAL LOW (ref 150–400)
Platelets: 142 10*3/uL — ABNORMAL LOW (ref 150–400)
RBC: 2.94 MIL/uL — AB (ref 3.87–5.11)
RBC: 3.01 MIL/uL — ABNORMAL LOW (ref 3.87–5.11)
RDW: 13.8 % (ref 11.5–15.5)
RDW: 13.7 % (ref 11.5–15.5)
WBC: 5.2 10*3/uL (ref 4.0–10.5)
WBC: 5.1 10*3/uL (ref 4.0–10.5)

## 2015-09-30 LAB — ABO/RH: ABO/RH(D): A POS

## 2015-09-30 LAB — COMPREHENSIVE METABOLIC PANEL
ALBUMIN: 4.4 g/dL (ref 3.5–5.0)
ALK PHOS: 58 U/L (ref 38–126)
ALT: 21 U/L (ref 14–54)
ANION GAP: 9 (ref 5–15)
AST: 60 U/L — AB (ref 15–41)
BUN: 23 mg/dL — AB (ref 6–20)
CO2: 28 mmol/L (ref 22–32)
Calcium: 9.9 mg/dL (ref 8.9–10.3)
Chloride: 103 mmol/L (ref 101–111)
Creatinine, Ser: 0.69 mg/dL (ref 0.44–1.00)
GFR calc Af Amer: >60 mL/min (ref >60)
GFR calc non Af Amer: >60 mL/min (ref >60)
GLUCOSE: 95 mg/dL (ref 65–99)
POTASSIUM: 4.3 mmol/L (ref 3.5–5.1)
SODIUM: 140 mmol/L (ref 135–145)
Total Bilirubin: 0.6 mg/dL (ref 0.3–1.2)
Total Protein: 7.3 g/dL (ref 6.5–8.1)

## 2015-09-30 LAB — PROTIME-INR
INR: 2.05 — AB (ref 0.00–1.49)
INR: 2.09 — AB (ref 0.00–1.49)
PROTHROMBIN TIME: 23 s — AB (ref 11.6–15.2)
PROTHROMBIN TIME: 23.3 s — AB (ref 11.6–15.2)

## 2015-09-30 LAB — TYPE AND SCREEN
ABO/RH(D): A POS
ANTIBODY SCREEN: NEGATIVE

## 2015-09-30 MED ORDER — ACETAMINOPHEN 650 MG RE SUPP: 650.0000 mg | Freq: Three times a day (TID) | RECTAL | Status: DC | PRN

## 2015-09-30 MED ORDER — FUROSEMIDE 40 MG PO TABS: 40.0000 mg | ORAL_TABLET | ORAL | Status: DC

## 2015-09-30 MED ORDER — VITAMIN D 1000 UNITS PO TABS
1000.0000 [IU] | ORAL_TABLET | Freq: Every day | ORAL | Status: DC
  Administered 2015-09-30 – 2015-10-01 (×2): 1000 [IU] via ORAL
  Filled 2015-09-30 (×2): qty 1

## 2015-09-30 MED ORDER — SODIUM CHLORIDE 0.9 % IJ SOLN
3.0000 mL | Freq: Two times a day (BID) | INTRAMUSCULAR | Status: DC
Start: 2015-09-30 — End: 2015-10-01
  Administered 2015-09-30: 3 mL via INTRAVENOUS

## 2015-09-30 MED ORDER — ACETAMINOPHEN 650 MG RE SUPP: 650.0000 mg | Freq: Four times a day (QID) | RECTAL | Status: DC | PRN

## 2015-09-30 MED ORDER — POTASSIUM CHLORIDE CRYS ER 10 MEQ PO TBCR
10.0000 meq | EXTENDED_RELEASE_TABLET | Freq: Two times a day (BID) | ORAL | Status: DC
  Administered 2015-09-30 – 2015-10-01 (×3): 10 meq via ORAL
  Filled 2015-09-30 (×4): qty 1

## 2015-09-30 MED ORDER — VITAMIN C 500 MG PO TABS
1000.0000 mg | ORAL_TABLET | Freq: Every day | ORAL | Status: DC
  Administered 2015-09-30 – 2015-10-01 (×2): 1000 mg via ORAL
  Filled 2015-09-30 (×2): qty 2

## 2015-09-30 MED ORDER — ACETAMINOPHEN 325 MG PO TABS: 650.0000 mg | ORAL_TABLET | Freq: Four times a day (QID) | ORAL | Status: DC | PRN

## 2015-09-30 MED ORDER — GUAIFENESIN ER 600 MG PO TB12
600.0000 mg | ORAL_TABLET | Freq: Two times a day (BID) | ORAL | Status: DC
  Administered 2015-09-30 – 2015-10-01 (×3): 600 mg via ORAL
  Filled 2015-09-30 (×4): qty 1

## 2015-09-30 MED ORDER — PROSIGHT PO TABS
1.0000 | ORAL_TABLET | Freq: Every day | ORAL | Status: DC
Start: 2015-09-30 — End: 2015-10-01
  Administered 2015-09-30 – 2015-10-01 (×2): 1 via ORAL
  Filled 2015-09-30 (×2): qty 1

## 2015-09-30 MED ORDER — CARVEDILOL 3.125 MG PO TABS
3.1250 mg | ORAL_TABLET | Freq: Two times a day (BID) | ORAL | Status: DC
Start: 2015-09-30 — End: 2015-10-01
  Administered 2015-09-30 – 2015-10-01 (×2): 3.125 mg via ORAL
  Filled 2015-09-30 (×4): qty 1

## 2015-09-30 MED ORDER — FUROSEMIDE 40 MG PO TABS
40.0000 mg | ORAL_TABLET | ORAL | Status: DC
  Administered 2015-09-30: 40 mg via ORAL
  Filled 2015-09-30: qty 1

## 2015-09-30 MED ORDER — BIOTIN 1000 MCG PO TABS: 1000.0000 ug | ORAL_TABLET | Freq: Every day | ORAL | Status: DC

## 2015-09-30 MED ORDER — TRAMADOL HCL 50 MG PO TABS
50.0000 mg | ORAL_TABLET | Freq: Four times a day (QID) | ORAL | Status: DC | PRN
Start: 2015-09-30 — End: 2015-10-01
  Administered 2015-09-30: 50 mg via ORAL
  Filled 2015-09-30: qty 1

## 2015-09-30 MED ORDER — PANTOPRAZOLE SODIUM 40 MG PO TBEC
40.0000 mg | DELAYED_RELEASE_TABLET | Freq: Every day | ORAL | Status: DC
  Administered 2015-10-01: 40 mg via ORAL
  Filled 2015-09-30 (×2): qty 1

## 2015-09-30 MED ORDER — FERROUS SULFATE 325 (65 FE) MG PO TABS
325.0000 mg | ORAL_TABLET | Freq: Every day | ORAL | Status: DC
  Administered 2015-09-30 – 2015-10-01 (×2): 325 mg via ORAL
  Filled 2015-09-30 (×3): qty 1

## 2015-09-30 MED ORDER — FLUTICASONE PROPIONATE 50 MCG/ACT NA SUSP
2.0000 | Freq: Every day | NASAL | Status: DC
  Administered 2015-09-30 – 2015-10-01 (×2): 2 via NASAL
  Filled 2015-09-30: qty 16

## 2015-09-30 MED ORDER — CALCIUM CARBONATE-VITAMIN D 500-200 MG-UNIT PO TABS
2.0000 | ORAL_TABLET | Freq: Every day | ORAL | Status: DC
  Administered 2015-09-30 – 2015-10-01 (×2): 2 via ORAL
  Filled 2015-09-30 (×2): qty 2

## 2015-09-30 MED ORDER — FUROSEMIDE 80 MG PO TABS
80.0000 mg | ORAL_TABLET | ORAL | Status: DC
  Administered 2015-10-01: 80 mg via ORAL
  Filled 2015-09-30: qty 1

## 2015-09-30 MED ORDER — OXYBUTYNIN CHLORIDE ER 5 MG PO TB24
5.0000 mg | ORAL_TABLET | Freq: Every day | ORAL | Status: DC
  Administered 2015-09-30: 5 mg via ORAL
  Filled 2015-09-30 (×2): qty 1

## 2015-09-30 MED ORDER — ONDANSETRON HCL 4 MG PO TABS: 4.0000 mg | ORAL_TABLET | Freq: Four times a day (QID) | ORAL | Status: DC | PRN

## 2015-09-30 MED ORDER — ONDANSETRON HCL 4 MG/2ML IJ SOLN: 4.0000 mg | Freq: Four times a day (QID) | INTRAMUSCULAR | Status: DC | PRN

## 2015-09-30 MED ORDER — ACETAMINOPHEN 325 MG PO TABS
650.0000 mg | ORAL_TABLET | Freq: Three times a day (TID) | ORAL | Status: DC | PRN
  Administered 2015-09-30: 650 mg via ORAL
  Filled 2015-09-30: qty 2

## 2015-09-30 MED ORDER — FLUTICASONE PROPIONATE 50 MCG/ACT NA SUSP
2.0000 | Freq: Every day | NASAL | Status: DC | PRN
  Filled 2015-09-30: qty 16

## 2015-09-30 MED ORDER — OMEGA-3-ACID ETHYL ESTERS 1 G PO CAPS
1.0000 | ORAL_CAPSULE | Freq: Every evening | ORAL | Status: DC
  Administered 2015-09-30: 1 g via ORAL
  Filled 2015-09-30 (×2): qty 1

## 2015-09-30 NOTE — ED Notes (Signed)
Pt has two skin tear, blood blisters on her right lower leg, one happened a month ago and is healing but continues to bleed when the dressing is changed, the other on happened two days ago when part of the hardware from her walker hit her leg and tore her skin, pt is on coumadin and now her leg is starting to swell

## 2015-09-30 NOTE — ED Notes (Signed)
Family at bedside. 

## 2015-09-30 NOTE — Consult Note (Signed)
  Right inner lower leg pictured above  Dr. Hassell Done saw her with me.  Was not able to import the other picture due to [facility] not working, but neither need surgical intervention.  Recommend wound care center for follow up.  +/- prophylactic antibiotics.  Wash with soap and water daily.  Do not use peroxide.  Keep elevated, ice with skin barrier is okay.  Prevent further bumps/injury.  Remove hazards from home.  Likely needs WD dressing to inner lower shin wound.    Dry dressing to the bullae and hematoma on upper lateral right shin.  Antibiotic ointment.  Non adhesive dressing with light kerlix gauze roll.  This will likely take weeks to months to heal.     Jomarie Longs, PA-C [facility] [phone]

## 2015-09-30 NOTE — H&P (Signed)
[facility] History and Physical  [name] [id number] DOB: 30-Nov-1918 DOA: 09/30/2015  Referring physician: Dr.Oni. PCP: [name], MD  Specialists: Dr.Kline. Cardiologist.  Chief Complaint: Right leg swelling and bleeding.  HPI: [name] is a 81 y.o. female with history of permanent atrial fibrillation, complete heart block status post pacemaker placement, chronic diastolic CHF, chronic anemia was brought to the ER after patient had increasing swelling in her right leg. Patient has sustained an injury with a walker last week following which patient has developed increasing swelling in the right leg area. Patient also has developed some blisters which were bleeding. Patient is on Coumadin for her atrial fibrillation. Last evening patient started increasing pressure and an new skin tear with bleed. Patient presented to the ER and on exam patient has right lower extremity around the shin, hematoma with some dark blood-filled blisters. On-call surgeon Dr. [name] was consulted by ER physician and at this time Dr. [name] has requested admission and further observation. Patient's INR is around 2.1. On my exam there is no active bleeding but there is at least 2 blisters with hematoma involving the right leg mostly in the anterior shin and there is also an old ulcer on the right leg medial aspect which patient had sustained a month ago. Patient is presently hemodynamically stable.   Review of Systems: As presented in the history of presenting illness, rest negative.  Past Medical History  Diagnosis Date  . CAD (coronary artery disease)   . Ostium secundum type atrial septal defect   . AV block     s/p PPM  . Venous insufficiency   . Pure hypercholesterolemia   . Diverticulosis of colon (without mention of hemorrhage)   . Overactive bladder   . DJD (degenerative joint disease)   . LBP (low back pain)   . Chronic pain syndrome   . Osteoporosis     pelvic fx 10/2013 s/p  fall  . Anemia    Past Surgical History  Procedure Laterality Date  . Vesicovaginal fistula closure w/ tah  1973  . Cataract extraction    . Asd repair    . Pacemaker placement  [place]  . Inguinal hernia repair  2011    bilaterally  . Shoulder surgery      x 2  . Ganglion cyst excision    . Partial hysterectomy     Social History:  reports that she has never smoked. She has never used smokeless tobacco. She reports that she does not drink alcohol or use illicit drugs. Where does patient live at home. Can patient participate in ADLs? Yes.  Allergies  Allergen Reactions  . Amiodarone Hcl     REACTION: INTOL to Amiodarone in past  . Codeine Nausea And Vomiting  . Fentanyl     REACTION: causes nausea--dizziness  . Lamisil [Terbinafine Hcl]     Causes nausea    Family History:  Family History  Problem Relation Age of Onset  . Heart disease Mother   . Breast cancer Sister   . Emphysema Father   . Heart attack Neg Hx   . Stroke Neg Hx   . Heart failure Mother       Prior to Admission medications   Medication Sig Start Date End Date Taking? Authorizing Provider  acetaminophen (TYLENOL) 500 MG tablet Take 650 mg by mouth every 8 (eight) hours as needed. Take with tramadol as needed for pain   Yes Historical Provider, MD  Ascorbic Acid (VITAMIN C) 1000 MG tablet Take 1,000 mg by mouth daily.     Yes Historical Provider, MD  Biotin 1000 MCG tablet Take 1,000 mcg by mouth daily.     Yes Historical Provider, MD  Calcium Carbonate-Vitamin D (CALCIUM 600+D) 600-400 MG-UNIT per tablet Take 2 tablets by mouth daily.     Yes Historical Provider, MD  Cholecalciferol (VITAMIN D) 1000 UNITS capsule Take 1,000 Units by mouth daily.     Yes Historical Provider, MD  ferrous sulfate 325 (65 FE) MG tablet Take 325 mg by mouth daily with breakfast.    Yes Historical Provider, MD  fluticasone (FLONASE) 50 MCG/ACT nasal spray Place 2 sprays into the nose daily as needed for allergies.     Yes Historical Provider, MD  furosemide (LASIX) 40 MG tablet Take 40-80 mg by mouth See admin instructions. Take 1 tab (40mg ) daily alternating with 2 tabs (80mg ) daily   Yes Historical Provider, MD  Glucosamine 500 MG CAPS Take 500 mg by mouth daily.    Yes Historical Provider, MD  KLOR-CON M10 10 MEQ tablet TAKE 1 TABLET TWICE A DAY AND AS NEEDED WHEN TAKING FUROSEMIDE 08/22/15  Yes [name], MD  Multiple Vitamins-Minerals (PRESERVISION AREDS) TABS Take 1 tablet by mouth daily.   Yes Historical Provider, MD  Omega-3 350 MG CAPS Take 1 capsule by mouth every evening.    Yes Historical Provider, MD  omeprazole (PRILOSEC) 20 MG capsule Take 20 mg by mouth daily as needed (acid reflux).    Yes Historical Provider, MD  tolterodine (DETROL) 2 MG tablet Take 2 mg by mouth 2 (two) times daily. 08/25/15  Yes Historical Provider, MD  traMADol (ULTRAM) 50 MG tablet Take 1 tablet (50 mg total) by mouth every 6 (six) hours as needed for moderate pain. 10/25/13  Yes [name], MD  vitamin A 8000 UNIT capsule Take 8,000 Units by mouth every other day.     Yes Historical Provider, MD  vitamin E 400 UNIT capsule Take 400 Units by mouth daily.     Yes Historical Provider, MD  warfarin (COUMADIN) 5 MG tablet Take 2.5-5 mg by mouth See admin instructions. Take 2.5mg  (0.5 tab) on Tuesdays and 5mg  (1 tab) on all other days.   Yes Historical Provider, MD  furosemide (LASIX) 40 MG tablet TAKE 1 TABLET DAILY OR AS DIRECTED Patient not taking: Reported on 09/30/2015 04/22/15   [name], MD  warfarin (COUMADIN) 5 MG tablet TAKE AS DIRECTED BY COUMADIN CLINIC Patient not taking: Reported on 09/30/2015 08/16/15   [name], MD    Physical Exam: Filed Vitals:   09/30/15 0051 09/30/15 0435  BP: 143/58 160/71  Pulse: 74 71  Temp: 97.4 F (36.3 C)   TempSrc: Oral   Resp: 20 16  SpO2: 100% 97%     General:  Moderately built and nourished.  Eyes: Anicteric no pallor.  ENT: No discharge from the  ears eyes nose more.  Neck: No mass felt.  Cardiovascular: S1-S2 heard.  Respiratory: No rhonchi or crepitations.  Abdomen: Soft nontender bowel sounds present.  Skin: Hematoma of the right lower extremity with 2 blood-filled blisters on the anterior shin.  Musculoskeletal: Swelling of the right leg as explained in the skin section.  Psychiatric: Appears normal.  Neurologic: Alert awake oriented to time place and person. Moves all extremities.  Labs on Admission:  Basic Metabolic Panel:  Recent Labs Lab 09/30/15 0224  NA 137  K 4.4  CL 102  CO2 28  GLUCOSE 92  BUN 26*  CREATININE 0.83  CALCIUM 9.9   Liver Function Tests: No results for input(s): AST, ALT, ALKPHOS, BILITOT, PROT, ALBUMIN in the last 168 hours. No results for input(s): LIPASE, AMYLASE in the last 168 hours. No results for input(s): AMMONIA in the last 168 hours. CBC:  Recent Labs Lab 09/30/15 0224  WBC 5.2  NEUTROABS 3.3  HGB 10.5*  HCT 31.8*  MCV 108.2*  PLT 143*   Cardiac Enzymes: No results for input(s): CKTOTAL, CKMB, CKMBINDEX, TROPONINI in the last 168 hours.  BNP (last 3 results) No results for input(s): BNP in the last 8760 hours.  ProBNP (last 3 results) No results for input(s): PROBNP in the last 8760 hours.  CBG: No results for input(s): GLUCAP in the last 168 hours.  Radiological Exams on Admission: Dg Tibia/fibula Right  09/30/2015  CLINICAL DATA:  Fall while on Coumadin, hematoma laterally. EXAM: RIGHT TIBIA AND FIBULA - 2 VIEW COMPARISON:  None. FINDINGS: The cortical margins of the tibia and fibula are intact. There is no fracture. The bones are under mineralized. Rounded soft tissue prominence in the mid lateral lower leg, likely site of hematoma. No radiopaque foreign body or soft tissue air. Vascular calcifications and scattered phleboliths are seen. IMPRESSION: 1. No fracture or dislocation in the right lower leg. 2. Rounded soft tissue prominence mid lateral leg  likely site of hematoma. Electronically Signed   By: Jeb Levering M.D.   On: 09/30/2015 03:55    Assessment/Plan Principal Problem:   Traumatic hematoma Active Problems:   AV BLOCK, COMPLETE   DIASTOLIC HEART FAILURE, CHRONIC   Pacemaker  single-chamber-St. Jude's   Permanent atrial fibrillation (HCC)   Leg hematoma   1. Hematoma of the right lower extremity secondary to trauma in a patient with coagulopathy secondary to Coumadin - at this time after detailed discussion with patient and patient's daughter explaining specifically about the risk of holding Coumadin since patient is having enlarging hematoma and possible chance of bleeding from the skin section patient and patient's daughter has agreed for holding Coumadin at this time. I have ordered a repeat INR and it still may need to be reversed with vitamin K. Patient and family is aware that holding Coumadin puts risk of stroke. If there is any further bleeding or hematoma worsens and reconsult surgery. 2. Chronic atrial fibrillation - rate controlled. Holding off Coumadin secondary to #1. Chads 2 vasc score is 2. 3. Diastolic CHF - EF not known. Appears compensated continue Lasix and potassium. 4. Chronic anemia - follow CBC. 5. History of complete heart block status post pacemaker placement.  I have reviewed patient's old charts and labs.   DVT Prophylaxisfoot pump.  Code Status: DO NOT RESUSCITATE.  Family Communication: Patient's daughter.  Disposition Plan: Admit to inpatient.    Ilena Dieckman N. [facility] Pager [phone].  If 7PM-7AM, please contact night-coverage [email or web address] Password [facility] 09/30/2015, 6:41 AM

## 2015-09-30 NOTE — Progress Notes (Signed)
PHARMACIST - PHYSICIAN ORDER COMMUNICATION  CONCERNING: P&T Medication Policy on Herbal Medications  DESCRIPTION:  This patient's order for:  Biotin  has been noted.  This product(s) is classified as an "herbal" or natural product. Due to a lack of definitive safety studies or FDA approval, nonstandard manufacturing practices, plus the potential risk of unknown drug-drug interactions while on inpatient medications, the Pharmacy and Therapeutics Committee does not permit the use of "herbal" or natural products of this type within [facility].   ACTION TAKEN: The pharmacy department is unable to verify this order at this time and your patient has been informed of this safety policy. Please reevaluate patient's clinical condition at discharge and address if the herbal or natural product(s) should be resumed at that time.  Thanks Dorrene German 09/30/2015 7:06 AM

## 2015-09-30 NOTE — Telephone Encounter (Signed)
New Message  Pt daughter calling to update coumadin clinic on pt bing in the hospital [facility]) and stopping coumadin due to hematoma. Please call back and discuss,.

## 2015-09-30 NOTE — ED Notes (Signed)
Patient transported to X-ray 

## 2015-09-30 NOTE — Progress Notes (Addendum)
PROGRESS NOTE  [name] [id number] DOB: 12/29/1918 DOA: 09/30/2015 PCP: [name], MD  HPI/Recap of past 24 hours:  Sitting on the edge of bed, c/o right leg pain, reported has been having productive cough, nasal congestion, no chest pain, no sob, no fever  Assessment/Plan: Principal Problem:   Traumatic hematoma Active Problems:   AV BLOCK, COMPLETE   DIASTOLIC HEART FAILURE, CHRONIC   Pacemaker  single-chamber-[employer]   Permanent atrial fibrillation (HCC)   Leg hematoma  Right lower extremity hematoma : after trauma from her walker, with two blood filled blisters on anterior shin and an old ulcer on right medial leg. No fever, no leukocytosis, will hold off on abx, coumadin held since admission. General surgery and wound care consulted.  H/o afib /av block s/p pacemaker: coumadin held since admission, paced rhythm.  Htn: patient reported was blood pressure meds except lasix, but did notice her blood pressure has been running higher recently, will start low dose coreg here.    Chronic diastolic chf, euvolemic, asymmetric right lower extremity edema from hematoma. Close monitor volume status, continue home lasix.  Productive cough/nasal congestion: flonase, mucinex, cxr for now, no fever, no chest pain, no sob.   Code Status: DNR  Family Communication: patient and daughter  Disposition Plan: remain in the hospital 1-2 days, home with home health? At discharge   Consultants:  General surgery  Procedures:  none  Antibiotics:  none   Objective: BP 178/60 mmHg  Pulse 70  Temp(Src) 98 F (36.7 C) (Oral)  Resp 16  Ht 4\' 11"  (1.499 m)  Wt 89 lb 15.2 oz (40.8 kg)  BMI 18.16 kg/m2  SpO2 100%  LMP  (LMP Unknown)  Intake/Output Summary (Last 24 hours) at 09/30/15 1550 Last data filed at 09/30/15 0900  Gross per 24 hour  Intake    240 ml  Output      0 ml  Net    240 ml   Filed Weights   09/30/15 0657  Weight: 89 lb 15.2 oz (40.8 kg)     Exam:   General:  NAD, frail  Cardiovascular: RRR  Respiratory: CTABL  Abdomen: Soft/ND/NT, positive BS  Musculoskeletal: right lower extremity edema, tender, dressing in place, left lower extremity unremarkable.  Neuro: aaox3, no focal deficit  Data Reviewed: Basic Metabolic Panel:  Recent Labs Lab 09/30/15 0224 09/30/15 0615  NA 137 140  K 4.4 4.3  CL 102 103  CO2 28 28  GLUCOSE 92 95  BUN 26* 23*  CREATININE 0.83 0.69  CALCIUM 9.9 9.9   Liver Function Tests:  Recent Labs Lab 09/30/15 0615  AST 60*  ALT 21  ALKPHOS 58  BILITOT 0.6  PROT 7.3  ALBUMIN 4.4   No results for input(s): LIPASE, AMYLASE in the last 168 hours. No results for input(s): AMMONIA in the last 168 hours. CBC:  Recent Labs Lab 09/30/15 0224 09/30/15 0615  WBC 5.2 5.1  NEUTROABS 3.3 2.8  HGB 10.5* 10.7*  HCT 31.8* 32.5*  MCV 108.2* 108.0*  PLT 143* 142*   Cardiac Enzymes:   No results for input(s): CKTOTAL, CKMB, CKMBINDEX, TROPONINI in the last 168 hours. BNP (last 3 results) No results for input(s): BNP in the last 8760 hours.  ProBNP (last 3 results) No results for input(s): PROBNP in the last 8760 hours.  CBG: No results for input(s): GLUCAP in the last 168 hours.  Recent Results (from the past 240 hour(s))  Culture, expectorated sputum-assessment  Status: None   Collection Time: 09/30/15  2:26 PM  Result Value Ref Range Status   Specimen Description SPU  Final   Special Requests NONE  Final   Sputum evaluation   Final    THIS SPECIMEN IS ACCEPTABLE. RESPIRATORY CULTURE REPORT TO FOLLOW.   Report Status 09/30/2015 FINAL  Final     Studies: Dg Chest 2 View  09/30/2015  CLINICAL DATA:  Cough for 2 days. Patient is off Coumadin for leg hematoma. EXAM: CHEST  2 VIEW COMPARISON:  12/29/2012 FINDINGS: Postsurgical changes from thoracotomy are stable. Dual lead cardiac pacemaker is in stable position. Cardiomediastinal silhouette is stably enlarged.  Mediastinal contours appear intact. Again seen is stable prominence of bilateral hilar regions. There is no evidence of focal airspace consolidation, pleural effusion or pneumothorax. Osseous structures are without acute abnormality. Osteopenia and osteoarthritic changes of the right shoulder noted. Soft tissues are grossly normal. IMPRESSION: Stable appearance of the chest with enlarged cardiac silhouette, and prominence of the hilar regions, particularly the right hilum. If further evaluation is desired, cross-sectional imaging may be considered. No evidence of focal airspace consolidation or large pleural effusion. Electronically Signed   By: Fidela Salisbury M.D.   On: 09/30/2015 14:30   Dg Tibia/fibula Right  09/30/2015  CLINICAL DATA:  Fall while on Coumadin, hematoma laterally. EXAM: RIGHT TIBIA AND FIBULA - 2 VIEW COMPARISON:  None. FINDINGS: The cortical margins of the tibia and fibula are intact. There is no fracture. The bones are under mineralized. Rounded soft tissue prominence in the mid lateral lower leg, likely site of hematoma. No radiopaque foreign body or soft tissue air. Vascular calcifications and scattered phleboliths are seen. IMPRESSION: 1. No fracture or dislocation in the right lower leg. 2. Rounded soft tissue prominence mid lateral leg likely site of hematoma. Electronically Signed   By: Jeb Levering M.D.   On: 09/30/2015 03:55    Scheduled Meds: . calcium-vitamin D  2 tablet Oral Daily  . carvedilol  3.125 mg Oral BID WC  . cholecalciferol  1,000 Units Oral Daily  . ferrous sulfate  325 mg Oral Q breakfast  . fluticasone  2 spray Each Nare Daily  . furosemide  40 mg Oral QODAY  . [START ON 10/01/2015] furosemide  80 mg Oral QODAY  . guaiFENesin  600 mg Oral BID  . multivitamin  1 tablet Oral Daily  . omega-3 acid ethyl esters  1 capsule Oral QPM  . oxybutynin  5 mg Oral QHS  . pantoprazole  40 mg Oral Daily  . potassium chloride  10 mEq Oral BID  . sodium  chloride  3 mL Intravenous Q12H  . vitamin C  1,000 mg Oral Daily    Continuous Infusions:    Time spent: >29mins from 12 noon to 12:40pm  Ratasha Fabre MD, PhD  [facility] Pager [phone]. If 7PM-7AM, please contact night-coverage at [email or web address], password [facility] 09/30/2015, 3:50 PM  LOS: 0 days

## 2015-09-30 NOTE — ED Provider Notes (Signed)
CSN: [id number]     Arrival date & time 09/30/15  0043 History   By signing my name below, I, [name], attest that this documentation has been prepared under the direction and in the presence of [name], MD.  Electronically Signed: Forrestine Curry, ED Scribe. 09/30/2015. 2:04 AM.   Chief Complaint  Patient presents with  . Leg Injury   The history is provided by the patient. No language interpreter was used.    HPI Comments: [name] is a 81 y.o. female with a PMHx of CAD and venous insufficiency who presents to the Emergency Department here after a R leg injury sustained 1 week ago. Daughter states pt was attempting to get her walker out when a piece of hardware from her walker hit her resulting in a skin tare and some mild swelling to the R lateral calf. Prior to this inccident, pt noted a blood blister to the R lower leg that never improved. Area continues to bleed when her dressing is changed. Area in painful and worsened with palpation. No alleviating factors at this time. OTC Tylenol attempted prior to arrival along with prescribed Tramadol without any improvement for discomfort. No recent fever, chills, nausea, vomiting, abdominal pain, chest pain, or shortness of breath. No weakness, loss of sensation, or numbness. [name] takes Coumadin and Lasix daily.  PCP: [name], MD    Past Medical History  Diagnosis Date  . CAD (coronary artery disease)   . Ostium secundum type atrial septal defect   . AV block     s/p PPM  . Venous insufficiency   . Pure hypercholesterolemia   . Diverticulosis of colon (without mention of hemorrhage)   . Overactive bladder   . DJD (degenerative joint disease)   . LBP (low back pain)   . Chronic pain syndrome   . Osteoporosis     pelvic fx 10/2013 s/p fall  . Anemia    Past Surgical History  Procedure Laterality Date  . Vesicovaginal fistula closure w/ tah  1973  . Cataract extraction    . Asd repair    . Pacemaker  placement  [place]  . Inguinal hernia repair  2011    bilaterally  . Shoulder surgery      x 2  . Ganglion cyst excision    . Partial hysterectomy     Family History  Problem Relation Age of Onset  . Heart disease Mother   . Breast cancer Sister   . Emphysema Father   . Heart attack Neg Hx   . Stroke Neg Hx   . Heart failure Mother    Social History  Substance Use Topics  . Smoking status: Never Smoker   . Smokeless tobacco: Never Used  . Alcohol Use: No     Comment: social use   OB History    No data available     Review of Systems  A complete 10 system review of systems was obtained and all systems are negative except as noted in the HPI and PMH.    Allergies  Amiodarone hcl; Codeine; Fentanyl; and Lamisil  Home Medications   Prior to Admission medications   Medication Sig Start Date End Date Taking? Authorizing Provider  acetaminophen (TYLENOL) 500 MG tablet Take 650 mg by mouth every 8 (eight) hours as needed. Take with tramadol as needed for pain    Historical Provider, MD  Ascorbic Acid (VITAMIN C) 1000 MG tablet Take 1,000 mg  by mouth daily.      Historical Provider, MD  Biotin 1000 MCG tablet Take 1,000 mcg by mouth daily.      Historical Provider, MD  Calcium Carbonate-Vitamin D (CALCIUM 600+D) 600-400 MG-UNIT per tablet Take 2 tablets by mouth daily.      Historical Provider, MD  Cholecalciferol (VITAMIN D) 1000 UNITS capsule Take 1,000 Units by mouth daily.      Historical Provider, MD  ferrous sulfate 325 (65 FE) MG tablet Take 325 mg by mouth daily with breakfast.     Historical Provider, MD  fluticasone (FLONASE) 50 MCG/ACT nasal spray Place 2 sprays into the nose daily as needed for allergies.     Historical Provider, MD  furosemide (LASIX) 40 MG tablet TAKE 1 TABLET DAILY OR AS DIRECTED 04/22/15   [name], MD  Glucosamine 500 MG CAPS Take 500 mg by mouth daily.     Historical Provider, MD  KLOR-CON M10 10 MEQ tablet TAKE 1 TABLET TWICE A  DAY AND AS NEEDED WHEN TAKING FUROSEMIDE 08/22/15   [name], MD  Multiple Vitamins-Minerals (PRESERVISION AREDS) TABS Take 1 tablet by mouth daily.    Historical Provider, MD  Omega-3 350 MG CAPS Take 1 capsule by mouth every evening.     Historical Provider, MD  omeprazole (PRILOSEC) 20 MG capsule Take 20 mg by mouth daily as needed (acid reflux).     Historical Provider, MD  tolterodine (DETROL LA) 2 MG 24 hr capsule Take 2 mg by mouth 2 (two) times daily.      Historical Provider, MD  traMADol (ULTRAM) 50 MG tablet Take 1 tablet (50 mg total) by mouth every 6 (six) hours as needed for moderate pain. 10/25/13   [name], MD  vitamin A 8000 UNIT capsule Take 8,000 Units by mouth every other day.      Historical Provider, MD  vitamin E 400 UNIT capsule Take 400 Units by mouth daily.      Historical Provider, MD  warfarin (COUMADIN) 5 MG tablet TAKE AS DIRECTED BY COUMADIN CLINIC 08/16/15   [name], MD   Triage Vitals: BP 143/58 mmHg  Pulse 74  Temp(Src) 97.4 F (36.3 C) (Oral)  Resp 20  SpO2 100%  LMP  (LMP Unknown)   Physical Exam  Constitutional: She is oriented to person, place, and time. She appears well-developed and well-nourished. No distress.  HENT:  Head: Normocephalic and atraumatic.  Nose: Nose normal.  Mouth/Throat: Oropharynx is clear and moist. No oropharyngeal exudate.  Eyes: Conjunctivae and EOM are normal. Pupils are equal, round, and reactive to light. No scleral icterus.  Neck: Normal range of motion. Neck supple. No JVD present. No tracheal deviation present. No thyromegaly present.  Cardiovascular: Normal rate, regular rhythm and normal heart sounds.  Exam reveals no gallop and no friction rub.   No murmur heard. Pulmonary/Chest: Effort normal and breath sounds normal. No respiratory distress. She has no wheezes. She exhibits no tenderness.  Abdominal: Soft. Bowel sounds are normal. She exhibits no distension and no mass. There is no tenderness.  There is no rebound and no guarding.  Musculoskeletal: Normal range of motion. She exhibits no edema or tenderness.  Lymphadenopathy:    She has no cervical adenopathy.  Neurological: She is alert and oriented to person, place, and time. No cranial nerve deficit. She exhibits normal muscle tone.  Skin: Skin is warm and dry. No rash noted. No erythema. No pallor.  Nursing note and vitals reviewed.  ED Course  Procedures (including critical care time)  DIAGNOSTIC STUDIES: Oxygen Saturation is 100% on RA, Normal by my interpretation.    COORDINATION OF CARE: 1:46 AM- Will order BMP, CBC, DG tibia/fibula R, and PT-INR. Discussed treatment plan with pt at bedside and pt agreed to plan.     Labs Review Labs Reviewed  PROTIME-INR - Abnormal; Notable for the following:    Prothrombin Time 23.0 (*)    INR 2.05 (*)    All other components within normal limits  CBC WITH DIFFERENTIAL/PLATELET - Abnormal; Notable for the following:    RBC 2.94 (*)    Hemoglobin 10.5 (*)    HCT 31.8 (*)    MCV 108.2 (*)    MCH 35.7 (*)    Platelets 143 (*)    All other components within normal limits  BASIC METABOLIC PANEL - Abnormal; Notable for the following:    BUN 26 (*)    GFR calc non Af Amer 59 (*)    All other components within normal limits    Imaging Review Dg Tibia/fibula Right  09/30/2015  CLINICAL DATA:  Fall while on Coumadin, hematoma laterally. EXAM: RIGHT TIBIA AND FIBULA - 2 VIEW COMPARISON:  None. FINDINGS: The cortical margins of the tibia and fibula are intact. There is no fracture. The bones are under mineralized. Rounded soft tissue prominence in the mid lateral lower leg, likely site of hematoma. No radiopaque foreign body or soft tissue air. Vascular calcifications and scattered phleboliths are seen. IMPRESSION: 1. No fracture or dislocation in the right lower leg. 2. Rounded soft tissue prominence mid lateral leg likely site of hematoma. Electronically Signed   By: Jeb Levering M.D.   On: 09/30/2015 03:55   I have personally reviewed and evaluated these images and lab results as part of my medical decision-making.   EKG Interpretation None      MDM   Final diagnoses:  None    Patient presents to the ED for soft tissue hematoma.  I spoke with Dr. Georgette Dover with surgery who states this may require an operation, if her wound is severe enough.  By my own evaluation, I do not think this will resolve on its own.  Patient has very thin skin and it feels tight due to this hematoma.  In addition she has significant soft tissue ecchymosis.  Will admit to hospitalist for surgical consultation and evaluation.  Patient may also need wound care assistance.   I personally performed the services described in this documentation, which was scribed in my presence. The recorded information has been reviewed and is accurate.     [name], MD 09/30/15 [phone]

## 2015-09-30 NOTE — Care Management Note (Signed)
Case Management Note  Patient Details  Name: [name] MRN: [id number] Date of Birth: 07-10-1919  Subjective/Objective:       Hematoma  And bloody blistering to rt lower leg with/ chf/inr>2.0, on coumadin at home,              Action/Plan:  Will follow for loc and needs.   Expected Discharge Date:                  Expected Discharge Plan:  Home/Self Care  In-House Referral:  NA  Discharge planning Services  CM Consult  Post Acute Care Choice:    Choice offered to:  NA  DME Arranged:    DME Agency:     HH Arranged:  NA HH Agency:     Status of Service:  In process, will continue to follow  Medicare Important Message Given:    Date Medicare IM Given:    Medicare IM give by:    Date Additional Medicare IM Given:    Additional Medicare Important Message give by:     If discussed at [place] of Stay Meetings, dates discussed:    Additional Comments:  Leeroy Cha, RN 09/30/2015, 10:42 AM

## 2015-09-30 NOTE — Care Management Important Message (Signed)
Important Message  Patient Details  Name: [name] MRN: [id number] Date of Birth: 01-May-1919   Medicare Important Message Given:  Yes    Shelda Altes 09/30/2015, 12:51 [place] Message  Patient Details  Name: [name] MRN: [id number] Date of Birth: Mar 25, 1919   Medicare Important Message Given:  Yes    Shelda Altes 09/30/2015, 12:51 PM

## 2015-09-30 NOTE — Telephone Encounter (Signed)
Telephoned daughter back and pt was taken last night to ER due to hematoma from her walker hitting the leg that ruptured and became swollen. Daughter states pt will be admitted for few days per MD with possible evacuation of hematoma. Daughter will call back when pt is discharge since they now have her off Coumadin .

## 2015-09-30 NOTE — ED Notes (Signed)
Patient is resting comfortably. 

## 2015-10-01 DIAGNOSIS — Z950 Presence of cardiac pacemaker: Secondary | ICD-10-CM

## 2015-10-01 DIAGNOSIS — I10 Essential (primary) hypertension: Secondary | ICD-10-CM

## 2015-10-01 DIAGNOSIS — R05 Cough: Secondary | ICD-10-CM

## 2015-10-01 DIAGNOSIS — I482 Chronic atrial fibrillation: Secondary | ICD-10-CM

## 2015-10-01 LAB — BASIC METABOLIC PANEL
Anion gap: 9 (ref 5–15)
BUN: 20 mg/dL (ref 6–20)
CALCIUM: 9.1 mg/dL (ref 8.9–10.3)
CO2: 27 mmol/L (ref 22–32)
Chloride: 103 mmol/L (ref 101–111)
Creatinine, Ser: 0.71 mg/dL (ref 0.44–1.00)
GLUCOSE: 96 mg/dL (ref 65–99)
POTASSIUM: 3.8 mmol/L (ref 3.5–5.1)
SODIUM: 139 mmol/L (ref 135–145)

## 2015-10-01 LAB — MAGNESIUM: MAGNESIUM: 1.9 mg/dL (ref 1.7–2.4)

## 2015-10-01 LAB — CBC
HCT: 30.3 % — ABNORMAL LOW (ref 36.0–46.0)
Hemoglobin: 10.2 g/dL — ABNORMAL LOW (ref 12.0–15.0)
MCH: 36 pg — AB (ref 26.0–34.0)
MCHC: 33.7 g/dL (ref 30.0–36.0)
MCV: 107.1 fL — ABNORMAL HIGH (ref 78.0–100.0)
PLATELETS: 134 10*3/uL — AB (ref 150–400)
RBC: 2.83 MIL/uL — AB (ref 3.87–5.11)
RDW: 13.6 % (ref 11.5–15.5)
WBC: 4.7 10*3/uL (ref 4.0–10.5)

## 2015-10-01 MED ORDER — GUAIFENESIN ER 600 MG PO TB12: 600.0000 mg | ORAL_TABLET | Freq: Two times a day (BID) | ORAL | Status: DC

## 2015-10-01 MED ORDER — CARVEDILOL 3.125 MG PO TABS: 3.1250 mg | ORAL_TABLET | Freq: Two times a day (BID) | ORAL | Status: DC

## 2015-10-01 NOTE — Progress Notes (Signed)
Subjective: No issues  Objective: Vital signs in last 24 hours: Temp:  [98.4 F (36.9 C)-98.7 F (37.1 C)] 98.4 F (36.9 C) (11/19 0601) Pulse Rate:  [64-74] 74 (11/19 0601) Resp:  [16] 16 (11/19 0601) BP: (130-163)/(48-55) 163/55 mmHg (11/19 0601) SpO2:  [99 %-100 %] 100 % (11/19 0601) Weight:  [42.9 kg (94 lb 9.2 oz)] 42.9 kg (94 lb 9.2 oz) (11/19 0601) Last BM Date: 09/29/15  Intake/Output from previous day: 11/18 0701 - 11/19 0700 In: 483 [P.O.:480; I.V.:3] Out: -  Intake/Output this shift:    Alert, sitting on bedside bench with family RLE - mid shin hematoma with bullae - intact, no LLE edema Open wound further distally. No cellulitis  Lab Results:   Recent Labs  09/30/15 0615 10/01/15 0530  WBC 5.1 4.7  HGB 10.7* 10.2*  HCT 32.5* 30.3*  PLT 142* 134*   BMET  Recent Labs  09/30/15 0615 10/01/15 0530  NA 140 139  K 4.3 3.8  CL 103 103  CO2 28 27  GLUCOSE 95 96  BUN 23* 20  CREATININE 0.69 0.71  CALCIUM 9.9 9.1   PT/INR  Recent Labs  09/30/15 0224 09/30/15 0615  LABPROT 23.0* 23.3*  INR 2.05* 2.09*   ABG No results for input(s): PHART, HCO3 in the last 72 hours.  Invalid input(s): PCO2, PO2  Studies/Results: Dg Chest 2 View  09/30/2015  CLINICAL DATA:  Cough for 2 days. Patient is off Coumadin for leg hematoma. EXAM: CHEST  2 VIEW COMPARISON:  12/29/2012 FINDINGS: Postsurgical changes from thoracotomy are stable. Dual lead cardiac pacemaker is in stable position. Cardiomediastinal silhouette is stably enlarged. Mediastinal contours appear intact. Again seen is stable prominence of bilateral hilar regions. There is no evidence of focal airspace consolidation, pleural effusion or pneumothorax. Osseous structures are without acute abnormality. Osteopenia and osteoarthritic changes of the right shoulder noted. Soft tissues are grossly normal. IMPRESSION: Stable appearance of the chest with enlarged cardiac silhouette, and prominence of the  hilar regions, particularly the right hilum. If further evaluation is desired, cross-sectional imaging may be considered. No evidence of focal airspace consolidation or large pleural effusion. Electronically Signed   By: Fidela Salisbury M.D.   On: 09/30/2015 14:30   Dg Tibia/fibula Right  09/30/2015  CLINICAL DATA:  Fall while on Coumadin, hematoma laterally. EXAM: RIGHT TIBIA AND FIBULA - 2 VIEW COMPARISON:  None. FINDINGS: The cortical margins of the tibia and fibula are intact. There is no fracture. The bones are under mineralized. Rounded soft tissue prominence in the mid lateral lower leg, likely site of hematoma. No radiopaque foreign body or soft tissue air. Vascular calcifications and scattered phleboliths are seen. IMPRESSION: 1. No fracture or dislocation in the right lower leg. 2. Rounded soft tissue prominence mid lateral leg likely site of hematoma. Electronically Signed   By: Jeb Levering M.D.   On: 09/30/2015 03:55    Anti-infectives: Anti-infectives    None      Assessment/Plan: RLE hematoma/bullae RLE open wound  Discussed with wound care with pt and daughter.  Would not purposefully rupture bullae, dry gauze over it with no tape Lower wound - can use w-d dresssing, cut to fit wound, cover with dry gauze, loosely wrap RLE with kerlix so there will be no need to put tape on skin  rec f/u with wound care center as outpt.  No need for surgical intervention.  Signing off  Leighton Ruff. Redmond Pulling, MD, FACS General, Bariatric, & Minimally In[facility]    LOS: 1 day    Gayland Curry 10/01/2015

## 2015-10-01 NOTE — Discharge Summary (Signed)
Discharge Summary  [name] [id number] DOB: 1919/05/31  PCP: [name], MD  Admit date: 09/30/2015 Discharge date: 10/01/2015  Time spent: <77mins  Recommendations for Outpatient Follow-up:  1. F/u with Dr. [name] to discuss resuming coumadin, and monitor blood pressure 2. F/u with wound care center for right lower extremity wound  Discharge Diagnoses:  Active Hospital Problems   Diagnosis Date Noted  . Traumatic hematoma 09/30/2015  . Permanent atrial fibrillation ([place]) 09/30/2015  . Leg hematoma 09/30/2015  . Cough   . Pacemaker  single-chamber-St. Jude's 04/17/2011  . DIASTOLIC HEART FAILURE, CHRONIC 01/08/2011  . AV BLOCK, COMPLETE 11/03/2008    Resolved Hospital Problems   Diagnosis Date Noted Date Resolved  No resolved problems to display.    Discharge Condition: stable  Diet recommendation: heart healthy  Filed Weights   09/30/15 0657 10/01/15 0601  Weight: 89 lb 15.2 oz (40.8 kg) 94 lb 9.2 oz (42.9 kg)    History of present illness:  [name] is a 81 y.o. female with history of permanent atrial fibrillation, complete heart block status post pacemaker placement, chronic diastolic CHF, chronic anemia was brought to the ER after patient had increasing swelling in her right leg. Patient has sustained an injury with a walker last week following which patient has developed increasing swelling in the right leg area. Patient also has developed some blisters which were bleeding. Patient is on Coumadin for her atrial fibrillation. Last evening patient started increasing pressure and an new skin tear with bleed. Patient presented to the ER and on exam patient has right lower extremity around the shin, hematoma with some dark blood-filled blisters. On-call surgeon Dr. [name] was consulted by ER physician and at this time Dr. [name] has requested admission and further observation. Patient's INR is around 2.1. On my exam there is no active bleeding but there is  at least 2 blisters with hematoma involving the right leg mostly in the anterior shin and there is also an old ulcer on the right leg medial aspect which patient had sustained a month ago. Patient is presently hemodynamically stable.    Hospital Course:  Principal Problem:   Traumatic hematoma Active Problems:   AV BLOCK, COMPLETE   DIASTOLIC HEART FAILURE, CHRONIC   Pacemaker  single-chamber-St. Jude's   Permanent atrial fibrillation ([facility])   Leg hematoma   Cough  Right lower extremity hematoma : after trauma from her walker, with two blood filled blisters on anterior shin and an old ulcer on right medial leg. No fever, no leukocytosis, coumadin held since admission. General surgery and wound care input appreciated, she is cleared to be discharged home with wound care center follow up. Right lower extremity edema has much improved at discharge. Denies pain.  H/o afib /av block s/p pacemaker: coumadin held since admission, paced rhythm. F/u with Dr. [name] to determine when to resume coumadin  HTN: patient reported was blood pressure meds except lasix, but did notice her blood pressure has been running higher recently,  start low dose coreg. Continue follow up with Dr. [name] for blood pressure monitoring.   Chronic diastolic chf, euvolemic, asymmetric right lower extremity edema from hematoma. Close monitor volume status, continue home lasix.  Productive cough/nasal congestion: flonase, mucinex, cxr no pna,  no fever, no chest pain, no sob. Cough resolved at discharge   Code Status: DNR  Family Communication: patient and daughter  Disposition Plan: home on 11/19   Consultants:  General surgery  Procedures:  none  Antibiotics:  none  Discharge Exam: BP 163/55 mmHg  Pulse 74  Temp(Src) 98.4 F (36.9 C) (Oral)  Resp 16  Ht 4\' 11"  (1.499 m)  Wt 94 lb 9.2 oz (42.9 kg)  BMI 19.09 kg/m2  SpO2 100%  LMP  (LMP Unknown)   General: NAD, frail  Cardiovascular:  RRR  Respiratory: CTABL  Abdomen: Soft/ND/NT, positive BS  Musculoskeletal: right lower extremity edema has much improved, tenderness resolved, dressing in place, left lower extremity unremarkable.  Neuro: aaox3, no focal deficit   Discharge Instructions You were cared for by a hospitalist during your hospital stay. If you have any questions about your discharge medications or the care you received while you were in the hospital after you are discharged, you can call the unit and asked to speak with the hospitalist on call if the hospitalist that took care of you is not available. Once you are discharged, your primary care physician will handle any further medical issues. Please note that NO REFILLS for any discharge medications will be authorized once you are discharged, as it is imperative that you return to your primary care physician (or establish a relationship with a primary care physician if you do not have one) for your aftercare needs so that they can reassess your need for medications and monitor your lab values.  Discharge Instructions    Diet - low sodium heart healthy    Complete by:  As directed      Increase activity slowly    Complete by:  As directed             Medication List    STOP taking these medications        warfarin 5 MG tablet  Commonly known as:  COUMADIN      TAKE these medications        acetaminophen 500 MG tablet  Commonly known as:  TYLENOL  Take 650 mg by mouth every 8 (eight) hours as needed. Take with tramadol as needed for pain     Biotin 1000 MCG tablet  Take 1,000 mcg by mouth daily.     CALCIUM 600+D 600-400 MG-UNIT tablet  Generic drug:  Calcium Carbonate-Vitamin D  Take 2 tablets by mouth daily.     carvedilol 3.125 MG tablet  Commonly known as:  COREG  Take 1 tablet (3.125 mg total) by mouth 2 (two) times daily with a meal.     ferrous sulfate 325 (65 FE) MG tablet  Take 325 mg by mouth daily with breakfast.      fluticasone 50 MCG/ACT nasal spray  Commonly known as:  FLONASE  Place 2 sprays into the nose daily as needed for allergies.     furosemide 40 MG tablet  Commonly known as:  LASIX  Take 40-80 mg by mouth See admin instructions. Take 1 tab (40mg ) daily alternating with 2 tabs (80mg ) daily     furosemide 40 MG tablet  Commonly known as:  LASIX  TAKE 1 TABLET DAILY OR AS DIRECTED     Glucosamine 500 MG Caps  Take 500 mg by mouth daily.     guaiFENesin 600 MG 12 hr tablet  Commonly known as:  MUCINEX  Take 1 tablet (600 mg total) by mouth 2 (two) times daily.     KLOR-CON M10 10 MEQ tablet  Generic drug:  potassium chloride  TAKE 1 TABLET TWICE A DAY AND AS NEEDED WHEN TAKING FUROSEMIDE     Omega-3 350 MG Caps  Take 1 capsule  by mouth every evening.     omeprazole 20 MG capsule  Commonly known as:  PRILOSEC  Take 20 mg by mouth daily as needed (acid reflux).     PRESERVISION AREDS Tabs  Take 1 tablet by mouth daily.     tolterodine 2 MG tablet  Commonly known as:  DETROL  Take 2 mg by mouth 2 (two) times daily.     traMADol 50 MG tablet  Commonly known as:  ULTRAM  Take 1 tablet (50 mg total) by mouth every 6 (six) hours as needed for moderate pain.     vitamin A 8000 UNIT capsule  Take 8,000 Units by mouth every other day.     vitamin C 1000 MG tablet  Take 1,000 mg by mouth daily.     Vitamin D 1000 UNITS capsule  Take 1,000 Units by mouth daily.     vitamin E 400 UNIT capsule  Take 400 Units by mouth daily.       Allergies  Allergen Reactions  . Amiodarone Hcl     REACTION: INTOL to Amiodarone in past  . Codeine Nausea And Vomiting  . Fentanyl     REACTION: causes nausea--dizziness  . Lamisil [Terbinafine Hcl]     Causes nausea       Follow-up Information    Follow up with Virl Axe, MD In 1 week.   Specialty:  Cardiology   Why:  to discuss when is safe to go back on coumadin   Contact information:   [address] [address] [phone]       Follow up with [facility]              In 1 week.   Why:  right lower extremity wound   Contact information:   [address] [phone]       The results of significant diagnostics from this hospitalization (including imaging, microbiology, ancillary and laboratory) are listed below for reference.    Significant Diagnostic Studies: Dg Chest 2 View  09/30/2015  CLINICAL DATA:  Cough for 2 days. Patient is off Coumadin for leg hematoma. EXAM: CHEST  2 VIEW COMPARISON:  12/29/2012 FINDINGS: Postsurgical changes from thoracotomy are stable. Dual lead cardiac pacemaker is in stable position. Cardiomediastinal silhouette is stably enlarged. Mediastinal contours appear intact. Again seen is stable prominence of bilateral hilar regions. There is no evidence of focal airspace consolidation, pleural effusion or pneumothorax. Osseous structures are without acute abnormality. Osteopenia and osteoarthritic changes of the right shoulder noted. Soft tissues are grossly normal. IMPRESSION: Stable appearance of the chest with enlarged cardiac silhouette, and prominence of the hilar regions, particularly the right hilum. If further evaluation is desired, cross-sectional imaging may be considered. No evidence of focal airspace consolidation or large pleural effusion. Electronically Signed   By: Fidela Salisbury M.D.   On: 09/30/2015 14:30   Dg Tibia/fibula Right  09/30/2015  CLINICAL DATA:  Fall while on Coumadin, hematoma laterally. EXAM: RIGHT TIBIA AND FIBULA - 2 VIEW COMPARISON:  None. FINDINGS: The cortical margins of the tibia and fibula are intact. There is no fracture. The bones are under mineralized. Rounded soft tissue prominence in the mid lateral lower leg, likely site of hematoma. No radiopaque foreign body or soft tissue air. Vascular calcifications and scattered  phleboliths are seen. IMPRESSION: 1. No fracture or dislocation in the right lower leg. 2. Rounded soft tissue  prominence mid lateral leg likely site of hematoma. Electronically Signed   By: Jeb Levering M.D.   On: 09/30/2015 03:55    Microbiology: Recent Results (from the past 240 hour(s))  Culture, expectorated sputum-assessment     Status: None   Collection Time: 09/30/15  2:26 PM  Result Value Ref Range Status   Specimen Description SPU  Final   Special Requests NONE  Final   Sputum evaluation   Final    THIS SPECIMEN IS ACCEPTABLE. RESPIRATORY CULTURE REPORT TO FOLLOW.   Report Status 09/30/2015 FINAL  Final  Culture, respiratory (NON-Expectorated)     Status: None (Preliminary result)   Collection Time: 09/30/15  2:26 PM  Result Value Ref Range Status   Specimen Description SPUTUM  Final   Special Requests NONE  Final   Gram Stain   Final    RARE WBC PRESENT, PREDOMINANTLY MONONUCLEAR RARE SQUAMOUS EPITHELIAL CELLS PRESENT FEW GRAM POSITIVE RODS RARE GRAM NEGATIVE RODS Performed at [facility]    Culture PENDING  Incomplete   Report Status PENDING  Incomplete     Labs: Basic Metabolic Panel:  Recent Labs Lab 09/30/15 0224 09/30/15 0615 10/01/15 0530  NA 137 140 139  K 4.4 4.3 3.8  CL 102 103 103  CO2 28 28 27   GLUCOSE 92 95 96  BUN 26* 23* 20  CREATININE 0.83 0.69 0.71  CALCIUM 9.9 9.9 9.1  MG  --   --  1.9   Liver Function Tests:  Recent Labs Lab 09/30/15 0615  AST 60*  ALT 21  ALKPHOS 58  BILITOT 0.6  PROT 7.3  ALBUMIN 4.4   No results for input(s): LIPASE, AMYLASE in the last 168 hours. No results for input(s): AMMONIA in the last 168 hours. CBC:  Recent Labs Lab 09/30/15 0224 09/30/15 0615 10/01/15 0530  WBC 5.2 5.1 4.7  NEUTROABS 3.3 2.8  --   HGB 10.5* 10.7* 10.2*  HCT 31.8* 32.5* 30.3*  MCV 108.2* 108.0* 107.1*  PLT 143* 142* 134*   Cardiac Enzymes: No results for input(s): CKTOTAL, CKMB, CKMBINDEX, TROPONINI in the  last 168 hours. BNP: BNP (last 3 results) No results for input(s): BNP in the last 8760 hours.  ProBNP (last 3 results) No results for input(s): PROBNP in the last 8760 hours.  CBG: No results for input(s): GLUCAP in the last 168 hours.     SignedFlorencia Reasons MD, PhD  [facility] 10/01/2015, 11:08 AM

## 2015-10-01 NOTE — Progress Notes (Signed)
Pt left at this time with her daughter.  Alert, oriented, and without c/o. Discharge instructions/prescriptions given/explained with pt and daughter verbalizing understanding. Followup appointments noted for Cardiology and [place].

## 2015-10-03 ENCOUNTER — Ambulatory Visit: Payer: Medicare Other | Admitting: Family

## 2015-10-03 ENCOUNTER — Telehealth: Payer: Self-pay | Admitting: *Deleted

## 2015-10-03 LAB — CULTURE, RESPIRATORY W GRAM STAIN: Culture: NORMAL

## 2015-10-03 NOTE — Telephone Encounter (Signed)
Pt was on TCM list admitted for Tramatic Hematoma (R) Lower Extremity. D/C 11/20 pt was referred to wound clinic have appt 1 week. aslo seeing Dr. [name] to start coumadin...[name]

## 2015-10-05 ENCOUNTER — Telehealth: Payer: Self-pay | Admitting: Internal Medicine

## 2015-10-05 ENCOUNTER — Encounter: Payer: Self-pay | Admitting: Internal Medicine

## 2015-10-05 ENCOUNTER — Ambulatory Visit (INDEPENDENT_AMBULATORY_CARE_PROVIDER_SITE_OTHER): Payer: Medicare Other | Admitting: Internal Medicine

## 2015-10-05 VITALS — BP 132/50 | HR 70 | Temp 97.6°F | Ht 59.0 in | Wt 91.5 lb

## 2015-10-05 DIAGNOSIS — I482 Chronic atrial fibrillation: Secondary | ICD-10-CM | POA: Diagnosis not present

## 2015-10-05 DIAGNOSIS — Z09 Encounter for follow-up examination after completed treatment for conditions other than malignant neoplasm: Secondary | ICD-10-CM | POA: Diagnosis not present

## 2015-10-05 DIAGNOSIS — Z7901 Long term (current) use of anticoagulants: Secondary | ICD-10-CM

## 2015-10-05 DIAGNOSIS — I4821 Permanent atrial fibrillation: Secondary | ICD-10-CM

## 2015-10-05 DIAGNOSIS — R54 Age-related physical debility: Secondary | ICD-10-CM

## 2015-10-05 DIAGNOSIS — R05 Cough: Secondary | ICD-10-CM

## 2015-10-05 DIAGNOSIS — R059 Cough, unspecified: Secondary | ICD-10-CM

## 2015-10-05 DIAGNOSIS — R238 Other skin changes: Secondary | ICD-10-CM

## 2015-10-05 DIAGNOSIS — T1490 Injury, unspecified: Secondary | ICD-10-CM

## 2015-10-05 DIAGNOSIS — Z8673 Personal history of transient ischemic attack (TIA), and cerebral infarction without residual deficits: Secondary | ICD-10-CM

## 2015-10-05 DIAGNOSIS — R600 Localized edema: Secondary | ICD-10-CM

## 2015-10-05 DIAGNOSIS — I10 Essential (primary) hypertension: Secondary | ICD-10-CM

## 2015-10-05 DIAGNOSIS — IMO0001 Reserved for inherently not codable concepts without codable children: Secondary | ICD-10-CM

## 2015-10-05 DIAGNOSIS — T148XXA Other injury of unspecified body region, initial encounter: Secondary | ICD-10-CM

## 2015-10-05 MED ORDER — DOXYCYCLINE HYCLATE 100 MG PO TABS: 100.0000 mg | ORAL_TABLET | Freq: Two times a day (BID) | ORAL | Status: DC

## 2015-10-05 MED ORDER — CARVEDILOL 3.125 MG PO TABS: 3.1250 mg | ORAL_TABLET | Freq: Two times a day (BID) | ORAL | Status: DC

## 2015-10-05 NOTE — Telephone Encounter (Signed)
Patient Name: WAKITA LABERGE DOB: 10-01-1919 Initial Comment Caller States wants to know if it is ok to give mother baby asprin, she is off of her coumadin. Nurse Assessment Nurse: Ronnald Ramp, RN, Miranda Date/Time (Eastern Time): 10/05/2015 11:25:48 AM Confirm and document reason for call. If symptomatic, describe symptoms. ---Caller states her mother has had a hematoma on her right leg from hitting her walker 2 weeks ago. She was seen in [facility] and told to monitor. On Thursday, her leg was swelling and seen in ED. Told to stop her Coumadin and admitted her to the hospital. While in the hospital, seen by wound care specialist and told to follow up with wound care. She also has cut on her leg that has been there for 1 month. Monday she called the office they were given and told there are no appt available until after Christmas. She has tried other clinics and they are not able to see her either. Has the patient traveled out of the country within the last 30 days? ---Not Applicable Does the patient have any new or worsening symptoms? ---Yes Will a triage be completed? ---Yes Related visit to physician within the last 2 weeks? ---Yes Does the PT have any chronic conditions? (i.e. diabetes, asthma, etc.) ---Yes List chronic conditions. ---A-fib, Is this a behavioral health call? ---No Guidelines Guideline Title Affirmed Question Affirmed Notes Bruises Taking Coumadin (warfarin) or other strong blood thinner, or known bleeding disorder (e.g., thrombocytopenia) Final Disposition User See Physician within 24 Hours Ronnald Ramp, RN, Miranda Comments No appt available at PCP office. Appt scheduled for Dr. Regis Bill at [facility] [place] today. Referrals GO TO FACILITY OTHER - SPECIFY Disagree/Comply: Comply

## 2015-10-05 NOTE — Telephone Encounter (Signed)
Spoke at length with daughter [name]. [name] needs her wound checked prior to coumadin restart and has a bad cough. Daughter feels she has gotten the ultimate run around since her mom was discharged last Saturday. Dr Regis Bill has agreed to see patient this afternoon to check her wound and evaluate her cough. I spoke with [name] and he felt we could move the patient up to Friday on Greg's schedule given the unique circumstances. TH evaluated patient to be seen within 24 hours. I also was able to get the patient into the wound center on 12/02 at 8am.

## 2015-10-05 NOTE — Patient Instructions (Addendum)
If phlegm getting  Infected  Can add antibiotic but don take it is getting better .   Can rx the coreg  .   Short term .  But  If causes dizziness  Or low bp readings  .Marland Kitchen      Would not add unless 160 and above or as per cardiology.  Hold it and have   cardiologist  . Opine.   I suspect it is ok to go back on the coumadin.   But not a loading dose .  But can also ask  Wound clinic.  and cardiology .   And fu with [facility]  Clinic also . Local wound care .  Still need to establish with PCP to help coordinate care.

## 2015-10-05 NOTE — Progress Notes (Signed)
Pre visit review using our clinic review tool, if applicable. No additional management support is needed unless otherwise documented below in the visit note.  Chief Complaint  Patient presents with  . Wound Check  . Cough    HPI: Patient [name]  comes in  With daughter today for SDA for problem evaluation. Sent in by team health as an urgent same day visit . To primary care   PCP Dr L in past  appt  With new pcp   Next week .  But not yet established   Family and patient uncertain what to do about meds etc .after Hospitalization   Had 2 injuries to le  One after heavy magazine feel of dash  of car and lacerated lle . Then hit right  lateral leg on walker  and got a very large blood blister hematoma   . Hospitalized   Pix to surgery felt no procedure but stop the anticoagulation.   She is on for  afib and hx of sub clinical stroke in past.  Has  pacer also and in coumadin  Clinic . No active bleeding leg less swollen but still present    Family concern about being off the coumadin with cv  cva risk   She developed cough pre hospital  and continues  c xray  and given mucinex now cough is deeper and some tan phelgm  But no fever  Wheeze .  Had diarrhea x 1 day none today .  No fever h/o hemoptysis .  Was given coreg bid for bp 160 170 and  Ran out 2 days ago  Cause  Rx  sent to mail away so ran [employer] that causes some dizziness orthostatic .   Told to get wound clinic appt in a week but daughter called all around and cant be seen until a month!  [name] in [employer] and got appt next week... Dec 2  ROS: See pertinent positives and negatives per HPI. No new cp sob  Ne fall new neuro sx or bleeding .  No vomiting   Past Medical History  Diagnosis Date  . CAD (coronary artery disease)   . Ostium secundum type atrial septal defect   . AV block     s/p PPM  . Venous insufficiency   . Pure hypercholesterolemia   . Diverticulosis of colon (without mention of  hemorrhage)   . Overactive bladder   . DJD (degenerative joint disease)   . LBP (low back pain)   . Chronic pain syndrome   . Osteoporosis     pelvic fx 10/2013 s/p fall  . Anemia     Family History  Problem Relation Age of Onset  . Heart disease Mother   . Breast cancer Sister   . Emphysema Father   . Heart attack Neg Hx   . Stroke Neg Hx   . Heart failure Mother     Social History   Social History  . Marital Status: Widowed    Spouse Name: N/A  . Number of Children: 2  . Years of Education: N/A   Occupational History  . Retired   .     Social History Main Topics  . Smoking status: Never Smoker   . Smokeless tobacco: Never Used  . Alcohol Use: No     Comment: social use  . Drug Use: No  . Sexual Activity: No   Other Topics Concern  . None  Social History Narrative   Lives with daughter    Daughter works in day     Outpatient Prescriptions Prior to Visit  Medication Sig Dispense Refill  . acetaminophen (TYLENOL) 500 MG tablet Take 650 mg by mouth every 8 (eight) hours as needed. Take with tramadol as needed for pain    . Ascorbic Acid (VITAMIN C) 1000 MG tablet Take 1,000 mg by mouth daily.      . Biotin 1000 MCG tablet Take 1,000 mcg by mouth daily.      . Calcium Carbonate-Vitamin D (CALCIUM 600+D) 600-400 MG-UNIT per tablet Take 2 tablets by mouth daily.      . Cholecalciferol (VITAMIN D) 1000 UNITS capsule Take 1,000 Units by mouth daily.      . ferrous sulfate 325 (65 FE) MG tablet Take 325 mg by mouth daily with breakfast.     . fluticasone (FLONASE) 50 MCG/ACT nasal spray Place 2 sprays into the nose daily as needed for allergies.     . furosemide (LASIX) 40 MG tablet Take 40-80 mg by mouth See admin instructions. Take 1 tab (40mg ) daily alternating with 2 tabs (80mg ) daily    . Glucosamine 500 MG CAPS Take 500 mg by mouth daily.     Marland Kitchen guaiFENesin (MUCINEX) 600 MG 12 hr tablet Take 1 tablet (600 mg total) by mouth 2 (two) times daily. 30 tablet 0  .  KLOR-CON M10 10 MEQ tablet TAKE 1 TABLET TWICE A DAY AND AS NEEDED WHEN TAKING FUROSEMIDE 270 tablet 0  . Multiple Vitamins-Minerals (PRESERVISION AREDS) TABS Take 1 tablet by mouth daily.    Marland Kitchen omeprazole (PRILOSEC) 20 MG capsule Take 20 mg by mouth daily as needed (acid reflux).     . tolterodine (DETROL) 2 MG tablet Take 2 mg by mouth 2 (two) times daily.    . traMADol (ULTRAM) 50 MG tablet Take 1 tablet (50 mg total) by mouth every 6 (six) hours as needed for moderate pain. 30 tablet 0  . vitamin A 8000 UNIT capsule Take 8,000 Units by mouth every other day.      . vitamin E 400 UNIT capsule Take 400 Units by mouth daily.      . Omega-3 350 MG CAPS Take 1 capsule by mouth every evening.     . carvedilol (COREG) 3.125 MG tablet Take 1 tablet (3.125 mg total) by mouth 2 (two) times daily with a meal. (Patient not taking: Reported on 10/05/2015) 60 tablet 0  . furosemide (LASIX) 40 MG tablet TAKE 1 TABLET DAILY OR AS DIRECTED (Patient not taking: Reported on 09/30/2015) 90 tablet 1   No facility-administered medications prior to visit.     EXAM:  BP 132/50 mmHg  Pulse 70  Temp(Src) 97.6 F (36.4 C) (Oral)  Ht 4\' 11"  (1.499 m)  Wt 91 lb 8 oz (41.504 kg)  BMI 18.47 kg/m2  SpO2 97%  LMP  (LMP Unknown)  Body mass index is 18.47 kg/(m^2).  GENERAL: vitals reviewed and listed above, alert, somewhat frail but ambulatory walker in nad   oriented, appears well hydrated and in no acute distress daughter give hx alos  HEENT: atraumatic, conjunctiva  clear, no obvious abnormalities on inspection of external nose and ears NECK: no obvious masses on inspection palpation  LUNGS:  Bilateral rhonchi basilar clear w coughing  Rare wheeze ( x ray lat week nad)  CV: HiRRR, no clubbing cyanosis   Left leg no edema right leg 2+ edema  Around trauma  SKIN LEG  Large 6 cm x 3 cm hematoma blister  Dark purple not tense  And no surrounding erythema .   Left medial supra ankle area wioth a divoted  Almost  ulcer minial crusting and some erythema no streaking  Mild discomfort  MS: moves all extremities  djd changes no acute findings  PSYCH: pleasant and cooperative,  Lab Results  Component Value Date   WBC 4.7 10/01/2015   HGB 10.2* 10/01/2015   HCT 30.3* 10/01/2015   PLT 134* 10/01/2015   GLUCOSE 96 10/01/2015   CHOL 154 06/14/2014   TRIG 84.0 06/14/2014   HDL 53.50 06/14/2014   LDLCALC 84 06/14/2014   ALT 21 09/30/2015   AST 60* 09/30/2015   NA 139 10/01/2015   K 3.8 10/01/2015   CL 103 10/01/2015   CREATININE 0.71 10/01/2015   BUN 20 10/01/2015   CO2 27 10/01/2015   TSH 0.62 06/14/2014   INR 2.09* 09/30/2015   BP Readings from Last 3 Encounters:  10/05/15 132/50  10/01/15 163/55  02/24/15 140/70    ASSESSMENT AND PLAN:  Discussed the following assessment and plan:  Traumatic hematoma - appear to not be activly bleeding at this time   but at risk   Hospital discharge follow-up - lack of clear directions and coordination for patient  and family   Permanent atrial fibrillation (HCC)  Cough - sounds bronchial and  pregressing      observe consider add antibiotic if  mor infected appearing over the holiday .   Long term current use of anticoagulant  Wound of skin - traumatic there for a month failure to heal surrounding edema of leg  local care wound =clinic next week.   Essential hypertension - up post hosp  but ok today  last dose yesterday? if cause dizziness etc   want to avoid low bp . get bp monitor and add if elevated and fu with cards for this.   Hx of completed stroke - by hx   has af   Leg edema, right - area of wound s assymmetrical     Age factor Total visit 45 mins > 50% spent counseling and coordinating care as indicated in above note and in instructions to patient .  -Patient advised to return or notify health care team  if symptoms worsen ,persist or new concerns arise.  Patient Instructions  If phlegm getting  Infected  Can add antibiotic but don  take it is getting better .   Can rx the coreg  .   Short term .  But  If causes dizziness  Or low bp readings  .Marland Kitchen      Would not add unless 160 and above or as per cardiology.  Hold it and have   cardiologist  . Opine.   I suspect it is ok to go back on the coumadin.   But not a loading dose .  But can also ask  Wound clinic.  and cardiology .   And fu with coumdan  Clinic also . Local wound care .  Still need to establish with PCP to help coordinate care.       Standley Brooking. Panosh M.D. Discharge Summaries by Florencia Reasons, MD at 10/01/2015 11:08 AM    Author: Florencia Reasons, MD Service: Internal Medicine Author Type: Physician   Filed: 10/01/2015 11:15 AM Note Time: 10/01/2015 11:08 AM Status: Signed   Editor: Florencia Reasons, MD (Physician)     Expand All Collapse All  Discharge Summary  [name] DOB: Dec 15, 1918  PCP: [name], MD  Admit date: 09/30/2015 Discharge date: 10/01/2015  Time spent: <42mins  Recommendations for Outpatient Follow-up:  1. F/u with Dr. [name] to discuss resuming coumadin, and monitor blood pressure 2. F/u with wound care center for right lower extremity wound  Discharge Diagnoses:  Active Hospital Problems   Diagnosis Date Noted  . Traumatic hematoma 09/30/2015  . Permanent atrial fibrillation ([place]) 09/30/2015  . Leg hematoma 09/30/2015  . Cough   . Pacemaker single-chamber-St. Jude's 04/17/2011  . DIASTOLIC HEART FAILURE, CHRONIC 01/08/2011  . AV BLOCK, COMPLETE 11/03/2008    Resolved Hospital Problems   Diagnosis Date Noted Date Resolved  No resolved problems to display.    Discharge Condition: stable  Diet recommendation: heart healthy  Filed Weights   09/30/15 0657 10/01/15 0601  Weight: 89 lb 15.2 oz (40.8 kg) 94 lb 9.2 oz (42.9 kg)    History of present illness:  ANOUK BILBERRY is a 81 y.o. female with history of permanent atrial fibrillation, complete heart block status  post pacemaker placement, chronic diastolic CHF, chronic anemia was brought to the ER after patient had increasing swelling in her right leg. Patient has sustained an injury with a walker last week following which patient has developed increasing swelling in the right leg area. Patient also has developed some blisters which were bleeding. Patient is on Coumadin for her atrial fibrillation. Last evening patient started increasing pressure and an new skin tear with bleed. Patient presented to the ER and on exam patient has right lower extremity around the shin, hematoma with some dark blood-filled blisters. On-call surgeon Dr. [name] was consulted by ER physician and at this time Dr. [name] has requested admission and further observation. Patient's INR is around 2.1. On my exam there is no active bleeding but there is at least 2 blisters with hematoma involving the right leg mostly in the anterior shin and there is also an old ulcer on the right leg medial aspect which patient had sustained a month ago. Patient is presently hemodynamically stable.    Hospital Course:  Principal Problem:  Traumatic hematoma Active Problems:  AV BLOCK, COMPLETE  DIASTOLIC HEART FAILURE, CHRONIC  Pacemaker single-chamber-St. Jude's  Permanent atrial fibrillation (HCC)  Leg hematoma  Cough  Right lower extremity hematoma : after trauma from her walker, with two blood filled blisters on anterior shin and an old ulcer on right medial leg. No fever, no leukocytosis, coumadin held since admission. General surgery and wound care input appreciated, she is cleared to be discharged home with wound care center follow up. Right lower extremity edema has much improved at discharge. Denies pain.  H/o afib /av block s/p pacemaker: coumadin held since admission, paced rhythm. F/u with Dr. [name] to determine when to resume coumadin  HTN: patient reported was blood pressure meds except lasix, but did notice her blood pressure has  been running higher recently, start low dose coreg. Continue follow up with Dr. [name] for blood pressure monitoring.   Chronic diastolic chf, euvolemic, asymmetric right lower extremity edema from hematoma. Close monitor volume status, continue home lasix.  Productive cough/nasal congestion: flonase, mucinex, cxr no pna, no fever, no chest pain, no sob. Cough resolved at discharge   Code Status: DNR  Family Communication: patient and daughter  Disposition Plan: home on 11/19   Consultants:  General surgery  Procedures:  none  Antibiotics:  none  Discharge Exam: BP 163/55 mmHg  Pulse  74  Temp(Src) 98.4 F (36.9 C) (Oral)  Resp 16  Ht 4\' 11"  (1.499 m)  Wt 94 lb 9.2 oz (42.9 kg)  BMI 19.09 kg/m2  SpO2 100%  LMP (LMP Unknown)   General: NAD, frail  Cardiovascular: RRR  Respiratory: CTABL  Abdomen: Soft/ND/NT, positive BS  Musculoskeletal: right lower extremity edema has much improved, tenderness resolved, dressing in place, left lower extremity unremarkable.  Neuro: aaox3, no focal deficit   Discharge Instructions You were cared for by a hospitalist during your hospital stay. If you have any questions about your discharge medications or the care you received while you were in the hospital after you are discharged, you can call the unit and asked to speak with the hospitalist on call if the hospitalist that took care of you is not available. Once you are discharged, your primary care physician will handle any further medical issues. Please note that NO REFILLS for any discharge medications will be authorized once you are discharged, as it is imperative that you return to your primary care physician (or establish a relationship with a primary care physician if you do not have one) for your aftercare needs so that they can reassess your need for medications and monitor your lab values.  Discharge Instructions    Diet - low sodium heart healthy  Complete  by: As directed      Increase activity slowly  Complete by: As directed             Medication List    STOP taking these medications       warfarin 5 MG tablet  Commonly known as: COUMADIN      TAKE these medications       acetaminophen 500 MG tablet  Commonly known as: TYLENOL  Take 650 mg by mouth every 8 (eight) hours as needed. Take with tramadol as needed for pain     Biotin 1000 MCG tablet  Take 1,000 mcg by mouth daily.     CALCIUM 600+D 600-400 MG-UNIT tablet  Generic drug: Calcium Carbonate-Vitamin D  Take 2 tablets by mouth daily.     carvedilol 3.125 MG tablet  Commonly known as: COREG  Take 1 tablet (3.125 mg total) by mouth 2 (two) times daily with a meal.     ferrous sulfate 325 (65 FE) MG tablet  Take 325 mg by mouth daily with breakfast.     fluticasone 50 MCG/ACT nasal spray  Commonly known as: FLONASE  Place 2 sprays into the nose daily as needed for allergies.     furosemide 40 MG tablet  Commonly known as: LASIX  Take 40-80 mg by mouth See admin instructions. Take 1 tab (40mg ) daily alternating with 2 tabs (80mg ) daily     furosemide 40 MG tablet  Commonly known as: LASIX  TAKE 1 TABLET DAILY OR AS DIRECTED     Glucosamine 500 MG Caps  Take 500 mg by mouth daily.     guaiFENesin 600 MG 12 hr tablet  Commonly known as: MUCINEX  Take 1 tablet (600 mg total) by mouth 2 (two) times daily.     KLOR-CON M10 10 MEQ tablet  Generic drug: potassium chloride  TAKE 1 TABLET TWICE A DAY AND AS NEEDED WHEN TAKING FUROSEMIDE     Omega-3 350 MG Caps  Take 1 capsule by mouth every evening.     omeprazole 20 MG capsule  Commonly known as: PRILOSEC  Take 20 mg by mouth daily as needed (acid reflux).  PRESERVISION AREDS Tabs  Take 1 tablet by mouth daily.     tolterodine 2 MG tablet  Commonly known as: DETROL  Take 2 mg by mouth 2  (two) times daily.     traMADol 50 MG tablet  Commonly known as: ULTRAM  Take 1 tablet (50 mg total) by mouth every 6 (six) hours as needed for moderate pain.     vitamin A 8000 UNIT capsule  Take 8,000 Units by mouth every other day.     vitamin C 1000 MG tablet  Take 1,000 mg by mouth daily.     Vitamin D 1000 UNITS capsule  Take 1,000 Units by mouth daily.     vitamin E 400 UNIT capsule  Take 400 Units by mouth daily.       Allergies  Allergen Reactions  . Amiodarone Hcl     REACTION: INTOL to Amiodarone in past  . Codeine Nausea And Vomiting  . Fentanyl     REACTION: causes nausea--dizziness  . Lamisil [Terbinafine Hcl]     Causes nausea       Follow-up Information    Follow up with Virl Axe, MD In 1 week.   Specialty: Cardiology   Why: to discuss when is safe to go back on coumadin   Contact information:   [address] [address] [phone]       Follow up with [facility] In 1 week.   Why: right lower extremity wound   Contact information:   [address] [place] [phone]        The results of significant diagnostics from this hospitalization (including imaging, microbiology, ancillary and laboratory) are listed below for reference.    Significant Diagnostic Studies:  Imaging Results    Dg Chest 2 View  09/30/2015 CLINICAL DATA: Cough for 2 days. Patient is off Coumadin for leg hematoma. EXAM: CHEST 2 VIEW COMPARISON: 12/29/2012 FINDINGS: Postsurgical changes from thoracotomy are stable. Dual lead cardiac pacemaker is in stable position. Cardiomediastinal silhouette is stably enlarged. Mediastinal contours appear intact. Again seen is stable prominence of bilateral hilar regions. There is no evidence of focal airspace consolidation, pleural  effusion or pneumothorax. Osseous structures are without acute abnormality. Osteopenia and osteoarthritic changes of the right shoulder noted. Soft tissues are grossly normal. IMPRESSION: Stable appearance of the chest with enlarged cardiac silhouette, and prominence of the hilar regions, particularly the right hilum. If further evaluation is desired, cross-sectional imaging may be considered. No evidence of focal airspace consolidation or large pleural effusion. Electronically Signed By: Fidela Salisbury M.D. On: 09/30/2015 14:30   Dg Tibia/fibula Right  09/30/2015 CLINICAL DATA: Fall while on Coumadin, hematoma laterally. EXAM: RIGHT TIBIA AND FIBULA - 2 VIEW COMPARISON: None. FINDINGS: The cortical margins of the tibia and fibula are intact. There is no fracture. The bones are under mineralized. Rounded soft tissue prominence in the mid lateral lower leg, likely site of hematoma. No radiopaque foreign body or soft tissue air. Vascular calcifications and scattered phleboliths are seen. IMPRESSION: 1. No fracture or dislocation in the right lower leg. 2. Rounded soft tissue prominence mid lateral leg likely site of hematoma. Electronically Signed By: Jeb Levering M.D. On: 09/30/2015 03:55     Microbiology: Recent Results (from the past 240 hour(s))  Culture, expectorated sputum-assessment Status: None   Collection Time: 09/30/15 2:26 PM  Result Value Ref Range Status   Specimen Description SPU  Final  Special Requests NONE  Final   Sputum evaluation   Final    THIS SPECIMEN IS ACCEPTABLE. RESPIRATORY CULTURE REPORT TO FOLLOW.   Report Status 09/30/2015 FINAL  Final  Culture, respiratory (NON-Expectorated) Status: None (Preliminary result)   Collection Time: 09/30/15 2:26 PM  Result Value Ref Range Status   Specimen Description SPUTUM  Final   Special Requests NONE  Final   Gram Stain   Final    RARE WBC  PRESENT, PREDOMINANTLY MONONUCLEAR RARE SQUAMOUS EPITHELIAL CELLS PRESENT FEW GRAM POSITIVE RODS RARE GRAM NEGATIVE RODS Performed at [facility]    Culture PENDING  Incomplete   Report Status PENDING  Incomplete     Labs: Basic Metabolic Panel:  Last Labs      Recent Labs Lab 09/30/15 0224 09/30/15 0615 10/01/15 0530  NA 137 140 139  K 4.4 4.3 3.8  CL 102 103 103  CO2 28 28 27   GLUCOSE 92 95 96  BUN 26* 23* 20  CREATININE 0.83 0.69 0.71  CALCIUM 9.9 9.9 9.1  MG --  --  1.9     Liver Function Tests:  Last Labs      Recent Labs Lab 09/30/15 0615  AST 60*  ALT 21  ALKPHOS 58  BILITOT 0.6  PROT 7.3  ALBUMIN 4.4      Last Labs     No results for input(s): LIPASE, AMYLASE in the last 168 hours.    Last Labs     No results for input(s): AMMONIA in the last 168 hours.   CBC:  Last Labs      Recent Labs Lab 09/30/15 0224 09/30/15 0615 10/01/15 0530  WBC 5.2 5.1 4.7  NEUTROABS 3.3 2.8 --   HGB 10.5* 10.7* 10.2*  HCT 31.8* 32.5* 30.3*  MCV 108.2* 108.0* 107.1*  PLT 143* 142* 134*     Cardiac Enzymes:  Last Labs     No results for input(s): CKTOTAL, CKMB, CKMBINDEX, TROPONINI in the last 168 hours.   BNP: BNP (last 3 results)  Recent Labs (within last 365 days)    No results for input(s): BNP in the last 8760 hours.    ProBNP (last 3 results)  Recent Labs (within last 365 days)    No results for input(s): PROBNP in the last 8760 hours.    CBG:  Last Labs     No results for input(s): GLUCAP in the last 168 hours.       SignedFlorencia Reasons MD, PhD [facility]

## 2015-10-07 ENCOUNTER — Encounter: Payer: Self-pay | Admitting: Family

## 2015-10-07 ENCOUNTER — Ambulatory Visit (INDEPENDENT_AMBULATORY_CARE_PROVIDER_SITE_OTHER): Payer: Medicare Other | Admitting: Family

## 2015-10-07 VITALS — BP 140/62 | HR 75 | Temp 97.8°F | Resp 16 | Wt 92.0 lb

## 2015-10-07 DIAGNOSIS — T1490 Injury, unspecified: Secondary | ICD-10-CM | POA: Diagnosis not present

## 2015-10-07 DIAGNOSIS — I482 Chronic atrial fibrillation: Secondary | ICD-10-CM | POA: Diagnosis not present

## 2015-10-07 DIAGNOSIS — R238 Other skin changes: Secondary | ICD-10-CM | POA: Diagnosis not present

## 2015-10-07 DIAGNOSIS — T148XXA Other injury of unspecified body region, initial encounter: Secondary | ICD-10-CM | POA: Insufficient documentation

## 2015-10-07 DIAGNOSIS — I4821 Permanent atrial fibrillation: Secondary | ICD-10-CM

## 2015-10-07 NOTE — Assessment & Plan Note (Signed)
Stable with pacemaker and no irregular heart rate noted. Restarted coumadin prior to this visit. Continue previous dose of coumadin and follow up with cardiology and coumadin clinic as scheduled. Anticoagulation does pose risk for bleeding of wounds/hematoma.

## 2015-10-07 NOTE — Progress Notes (Signed)
Pre visit review using our clinic review tool, if applicable. No additional management support is needed unless otherwise documented below in the visit note. 

## 2015-10-07 NOTE — Patient Instructions (Addendum)
Thank you for choosing [facility].  Summary/Instructions:  Your wound appears to be doing okay. Please continue to keep clean with saline. Use a light layer of triple antibiotic. Change dressing as needed.   Please continue to take your coumadin as you were previously.   Follow up with cardiology, wound care and coumadin clinic as scheduled.   If your symptoms worsen or fail to improve, please contact our office for further instruction, or in case of emergency go directly to the emergency room at the closest medical facility.

## 2015-10-07 NOTE — Assessment & Plan Note (Signed)
Delayed wound healing complicated by history of heart failure and venous insufficiency. There is no evidence of infection. Continue basic wound care with saline and triple antibiotic changing dressing as needed. Follow up with wound care as scheduled or in primary care prior to then if symptoms worsen or fail to improve.

## 2015-10-07 NOTE — Progress Notes (Signed)
Subjective:    Patient ID: [name], female    DOB: 12/23/1918, 81 y.o.   MRN: [id number]  No chief complaint on file.   HPI:  [name] is a 81 y.o. female who  has a past medical history of CAD (coronary artery disease); Ostium secundum type atrial septal defect; AV block; Venous insufficiency; Pure hypercholesterolemia; Diverticulosis of colon (without mention of hemorrhage); Overactive bladder; DJD (degenerative joint disease); LBP (low back pain); Chronic pain syndrome; Osteoporosis; and Anemia. and presents today for a follow up.  1.) Lower extremity wounds - She has two lower extremity wounds on her right leg. Her lower anterior shin was injured when a magazine fell off the dashboard and the spine struck the anterior aspect of her right shin. Modifying includes perioxide and triple antibiotic ointment. She was was seen in the hospital secondary to a hematoma that she expeienced from striking her walker. She was on coumadin at the time for atrial fibrillation and has not currently been taking the medication. She was seen by a wound care specialist and instructed to keep clean with saline and triple antibiotic. Denies any fevers, chills or other signs of infection.   Allergies  Allergen Reactions  . Amiodarone Hcl     REACTION: INTOL to Amiodarone in past  . Codeine Nausea And Vomiting  . Fentanyl     REACTION: causes nausea--dizziness  . Lamisil [Terbinafine Hcl]     Causes nausea     Current Outpatient Prescriptions on File Prior to Visit  Medication Sig Dispense Refill  . acetaminophen (TYLENOL) 500 MG tablet Take 650 mg by mouth every 8 (eight) hours as needed. Take with tramadol as needed for pain    . Ascorbic Acid (VITAMIN C) 1000 MG tablet Take 1,000 mg by mouth daily.      . Biotin 1000 MCG tablet Take 1,000 mcg by mouth daily.      . Calcium Carbonate-Vitamin D (CALCIUM 600+D) 600-400 MG-UNIT per tablet Take 2 tablets by mouth daily.      . Cholecalciferol  (VITAMIN D) 1000 UNITS capsule Take 1,000 Units by mouth daily.      . ferrous sulfate 325 (65 FE) MG tablet Take 325 mg by mouth daily with breakfast.     . fluticasone (FLONASE) 50 MCG/ACT nasal spray Place 2 sprays into the nose daily as needed for allergies.     . furosemide (LASIX) 40 MG tablet Take 40-80 mg by mouth See admin instructions. Take 1 tab (40mg ) daily alternating with 2 tabs (80mg ) daily    . Glucosamine 500 MG CAPS Take 500 mg by mouth daily.     Marland Kitchen guaiFENesin (MUCINEX) 600 MG 12 hr tablet Take 1 tablet (600 mg total) by mouth 2 (two) times daily. 30 tablet 0  . KLOR-CON M10 10 MEQ tablet TAKE 1 TABLET TWICE A DAY AND AS NEEDED WHEN TAKING FUROSEMIDE 270 tablet 0  . Multiple Vitamins-Minerals (PRESERVISION AREDS) TABS Take 1 tablet by mouth daily.    . Omega-3 350 MG CAPS Take 1 capsule by mouth every evening.     Marland Kitchen omeprazole (PRILOSEC) 20 MG capsule Take 20 mg by mouth daily as needed (acid reflux).     . tolterodine (DETROL) 2 MG tablet Take 2 mg by mouth 2 (two) times daily.    . traMADol (ULTRAM) 50 MG tablet Take 1 tablet (50 mg total) by mouth every 6 (six) hours as needed for moderate pain. 30 tablet 0  . vitamin  A 8000 UNIT capsule Take 8,000 Units by mouth every other day.      . vitamin E 400 UNIT capsule Take 400 Units by mouth daily.      . carvedilol (COREG) 3.125 MG tablet Take 1 tablet (3.125 mg total) by mouth 2 (two) times daily with a meal. (Patient not taking: Reported on 10/07/2015) 60 tablet 0  . doxycycline (VIBRA-TABS) 100 MG tablet Take 1 tablet (100 mg total) by mouth 2 (two) times daily. (Patient not taking: Reported on 10/07/2015) 14 tablet 0   No current facility-administered medications on file prior to visit.    Review of Systems  Constitutional: Negative for fever and chills.  Skin: Positive for wound.      Objective:    BP 140/62 mmHg  Pulse 75  Temp(Src) 97.8 F (36.6 C) (Oral)  Resp 16  Wt 92 lb (41.731 kg)  SpO2 97%  LMP  (LMP  Unknown) Nursing note and vital signs reviewed.  Physical Exam  Constitutional: She is oriented to person, place, and time. She appears well-developed and well-nourished. No distress.  Cardiovascular: Normal rate, regular rhythm, normal heart sounds and intact distal pulses.   Pulmonary/Chest: Effort normal and breath sounds normal.  Neurological: She is alert and oriented to person, place, and time.  Skin: Skin is warm and dry.     Psychiatric: She has a normal mood and affect. Her behavior is normal. Judgment and thought content normal.       Assessment & Plan:   Problem List Items Addressed This Visit      Cardiovascular and Mediastinum   Permanent atrial fibrillation ([place])    Stable with pacemaker and no irregular heart rate noted. Restarted coumadin prior to this visit. Continue previous dose of coumadin and follow up with cardiology and coumadin clinic as scheduled. Anticoagulation does pose risk for bleeding of wounds/hematoma.        Musculoskeletal and Integument   Wound of skin    Delayed wound healing complicated by history of heart failure and venous insufficiency. There is no evidence of infection. Continue basic wound care with saline and triple antibiotic changing dressing as needed. Follow up with wound care as scheduled or in primary care prior to then if symptoms worsen or fail to improve.         Other   Traumatic hematoma - Primary    Traumatic hematoma with slow healing at risk for additional bleeding secondary to anticoagulation which has been reinitiated. Continue basic wound care. Follow up instructions and bleeding control instructions provided.

## 2015-10-07 NOTE — Assessment & Plan Note (Signed)
Traumatic hematoma with slow healing at risk for additional bleeding secondary to anticoagulation which has been reinitiated. Continue basic wound care. Follow up instructions and bleeding control instructions provided.

## 2015-10-10 ENCOUNTER — Ambulatory Visit (INDEPENDENT_AMBULATORY_CARE_PROVIDER_SITE_OTHER): Payer: Medicare Other | Admitting: Cardiology

## 2015-10-10 ENCOUNTER — Ambulatory Visit (INDEPENDENT_AMBULATORY_CARE_PROVIDER_SITE_OTHER): Payer: Medicare Other

## 2015-10-10 ENCOUNTER — Encounter: Payer: Self-pay | Admitting: Cardiology

## 2015-10-10 VITALS — BP 152/58 | HR 75 | Ht 59.0 in | Wt 90.0 lb

## 2015-10-10 DIAGNOSIS — Z5181 Encounter for therapeutic drug level monitoring: Secondary | ICD-10-CM | POA: Diagnosis not present

## 2015-10-10 DIAGNOSIS — I4891 Unspecified atrial fibrillation: Secondary | ICD-10-CM | POA: Diagnosis not present

## 2015-10-10 LAB — POCT INR: INR: 1.7

## 2015-10-10 MED ORDER — CARVEDILOL 3.125 MG PO TABS: 3.1250 mg | ORAL_TABLET | Freq: Two times a day (BID) | ORAL | Status: DC

## 2015-10-10 NOTE — Progress Notes (Signed)
10/10/2015 [name]   11/20/1918  [id number]  Primary Physician [name], MD Primary Cardiologist/ Electrophysiologist: Dr. [name]  Reason for Visit/CC: [facility] F/u for Right Lower extremity wound/ hematoma in the setting of chronic anticoagulation with Coumadin.   HPI:  80 y.o. female with a hx of chronic AFib, s/p AVN ablation and PPM, diastolic HF, HTN, non-obstructive CAD by LHC in 1997. She is on chronic coumadin therapy for her atrial fibrillation. She is followed by Dr. [name].   She presents to clinic today for post hospital f/u. She was recently admitted by IM 11/18-11/-19 for evaluation/ management of a right LE wound. Per discharge summary, she presented to the [facility] ED with a complaint of increasing RLE edema, following an injury she sustained to the right leg after bumping into her walker. This resulted in a skin tear with a bleed/ skin hematoma. Her INR was stable and therapeutic at 2.1. She was seen by the on-call surgeon, Dr. [name], who recommended overnight observation. The wound/ hematoma remained stable and she was discharged home the next day. She was instructed to f/u in our clinic for f/u and to determine when to restart Coumadin.  She is accompanied to clinic today by her daughter. She reports that she has done well since leaving the hospital. She was seen by a wound MD last week who told her it was ok to restart Coumadin. She has been back on Coumadin for the past 5 days. She continues to have a small hematoma however, it has not worsened. She also notes a non healing ulcer on the anterior lateral aspect of her RLE just proximal to the right lateral malleolus. This has been present for at least 1 month. No signs of infection. She denies fever and chills. No h/o DM. Pulses are audible by doppler in the office today. She is due for an INR check today in the coumdin clinic. She is also scheduled for a f/u appt in the wound clinic on Friday.   Also notable, she  has had elevated BPs in the last 2 weeks. Her BP was in th 160s while in the ED and is in the 150s today. She denies any dietary changes. No significant pain. [facility] ED prescribed low dose Coreg but she has not yet started this.      Current Outpatient Prescriptions  Medication Sig Dispense Refill  . acetaminophen (TYLENOL) 500 MG tablet Take 650 mg by mouth every 8 (eight) hours as needed. Take with tramadol as needed for pain    . Ascorbic Acid (VITAMIN C) 1000 MG tablet Take 1,000 mg by mouth daily.      . Biotin 1000 MCG tablet Take 1,000 mcg by mouth daily.      . Calcium Carbonate-Vitamin D (CALCIUM 600+D) 600-400 MG-UNIT per tablet Take 2 tablets by mouth daily.      . Cholecalciferol (VITAMIN D) 1000 UNITS capsule Take 1,000 Units by mouth daily.      . ferrous sulfate 325 (65 FE) MG tablet Take 325 mg by mouth daily with breakfast.     . fluticasone (FLONASE) 50 MCG/ACT nasal spray Place 2 sprays into the nose daily as needed for allergies.     . furosemide (LASIX) 40 MG tablet Take 40-80 mg by mouth See admin instructions. Take 1 tab (40mg ) daily alternating with 2 tabs (80mg ) daily    . Glucosamine 500 MG CAPS Take 500 mg by mouth daily.     Marland Kitchen guaiFENesin (MUCINEX) 600  MG 12 hr tablet Take 1 tablet (600 mg total) by mouth 2 (two) times daily. 30 tablet 0  . Multiple Vitamins-Minerals (PRESERVISION AREDS) TABS Take 1 tablet by mouth daily.    . Omega-3 350 MG CAPS Take 1 capsule by mouth every evening.     Marland Kitchen omeprazole (PRILOSEC) 20 MG capsule Take 20 mg by mouth daily as needed (acid reflux).     . potassium chloride (K-DUR) 10 MEQ tablet Take 10 mEq by mouth 2 (two) times daily. And as needed when taking furosemide.    . tolterodine (DETROL) 2 MG tablet Take 2 mg by mouth 2 (two) times daily.    . traMADol (ULTRAM) 50 MG tablet Take 1 tablet (50 mg total) by mouth every 6 (six) hours as needed for moderate pain. 30 tablet 0  . vitamin A 8000 UNIT capsule Take 8,000 Units by mouth  every other day.      . vitamin E 400 UNIT capsule Take 400 Units by mouth daily.      Marland Kitchen warfarin (COUMADIN) 5 MG tablet Take by mouth as directed by the coumadin clinic    . carvedilol (COREG) 3.125 MG tablet Take 1 tablet (3.125 mg total) by mouth 2 (two) times daily.     No current facility-administered medications for this visit.    Allergies  Allergen Reactions  . Amiodarone Hcl     REACTION: INTOL to Amiodarone in past  . Codeine Nausea And Vomiting  . Fentanyl     REACTION: causes nausea--dizziness  . Lamisil [Terbinafine Hcl]     Causes nausea    Social History   Social History  . Marital Status: Widowed    Spouse Name: N/A  . Number of Children: 2  . Years of Education: N/A   Occupational History  . Retired   .     Social History Main Topics  . Smoking status: Never Smoker   . Smokeless tobacco: Never Used  . Alcohol Use: No     Comment: social use  . Drug Use: No  . Sexual Activity: No   Other Topics Concern  . Not on file   Social History Narrative   Lives with daughter    Daughter works in day      Review of Systems: General: negative for chills, fever, night sweats or weight changes.  Cardiovascular: negative for chest pain, dyspnea on exertion, edema, orthopnea, palpitations, paroxysmal nocturnal dyspnea or shortness of breath Dermatological: negative for rash Respiratory: negative for cough or wheezing Urologic: negative for hematuria Abdominal: negative for nausea, vomiting, diarrhea, bright red blood per rectum, melena, or hematemesis Neurologic: negative for visual changes, syncope, or dizziness All other systems reviewed and are otherwise negative except as noted above.    Blood pressure 152/58, pulse 75, height 4\' 11"  (1.499 m), weight 90 lb (40.824 kg).  General appearance: alert, cooperative, no distress and frail appearing Neck: no carotid bruit and no JVD Lungs: bilateral rhonci. no wheezing or rales Heart: regular rate and  rhythm, S1, S2 normal, no murmur, click, rub or gallop Extremities: no LEE, small hematoma on the anterior aspect of the distal portion of the right lower extremity. small nonhealing ulcer of the anterior lateral apect of the distal RLE Pulses: 2+ and symmetric, distal pulses audible by doppler Skin: warm and dry Neurologic: Grossly normal  EKG paced rhythm. 75 bpm  ASSESSMENT AND PLAN:   1. Right Lower Extremity Wound/ Hematoma: stable. She restarted Coumadin 5 days ago and no  increase in size. She is scheduled for repeat f/u on Friday at wound clinic. Patient instructed to report any increase in the size of her hematoma. Hopefully this will continue to resolve.   2. Chronic Atrial Fibrillation: rate is controlled in the 70s. It was recommended that she take low dose coreg as prescribed in the ED for better BP control. Her CHA2DS2 VASc score is at lest 4 for age > 25, female sex and HTN. Continue Coumadin.  3. Chronic Coumadin Therapy: her CHA2DS2 VASc score is at lest 4 for age > 43, female sex and HTN. Her recent LE hematoma was the result of a bump into her walker and not a fall. She is stable on her feet and denies any additional bleeding. Check INR today in Couamdin clinic. Pharmacy to dose.   4. PPM: inserted for CHB. Followed by Dr. [name].   5.Chronic Diastolic CHF:  Volume is stable. Coreg added for BP control.   6. HTN: BP elevated in the 150s today and was in the 160s in the ED. 3.125 mg of Coreg was recommenced by ED MD. I advised patient to start taking.   PLAN  Continue routine f/u with Dr. [name] in [place] clinic for afib/ PPM. She is to continue routine INR checks/ coumadin dose adjustments. Wound clinic to continue wound management. She has f/u scheduled 10/14/15.   Lyda Jester PA-C 10/10/2015 4:57 PM

## 2015-10-10 NOTE — Patient Instructions (Signed)
Medication Instructions:  Please start Coreg 3.125mg  Twice daily that was previously prescribed  It ok to take Coricidin over the counter for cold cold symptoms  Labwork: None ordered  Testing/Procedures: None ordered  Follow-Up: Follow up as planned with Dr.Klein March 2017  Any Other Special Instructions Will Be Listed Below (If Applicable).     If you need a refill on your cardiac medications before your next appointment, please call your pharmacy.

## 2015-10-11 ENCOUNTER — Ambulatory Visit: Payer: Medicare Other | Admitting: Family

## 2015-10-13 ENCOUNTER — Telehealth: Payer: Self-pay | Admitting: Family

## 2015-10-13 ENCOUNTER — Ambulatory Visit (INDEPENDENT_AMBULATORY_CARE_PROVIDER_SITE_OTHER): Payer: Medicare Other | Admitting: Family

## 2015-10-13 ENCOUNTER — Encounter: Payer: Self-pay | Admitting: Family

## 2015-10-13 VITALS — BP 140/60 | HR 83 | Temp 97.7°F | Resp 18 | Ht 59.0 in | Wt 90.5 lb

## 2015-10-13 DIAGNOSIS — T148XXA Other injury of unspecified body region, initial encounter: Secondary | ICD-10-CM

## 2015-10-13 DIAGNOSIS — T1490 Injury, unspecified: Secondary | ICD-10-CM

## 2015-10-13 DIAGNOSIS — R238 Other skin changes: Secondary | ICD-10-CM

## 2015-10-13 NOTE — Assessment & Plan Note (Signed)
Hematoma appears slightly smaller than previous note with some mild discharge. Skin cleansed and bandaged. Continue basic wound care at this time. Healing may be challenging given coumadin. Follow up with wound care as scheduled.

## 2015-10-13 NOTE — Progress Notes (Signed)
Pre visit review using our clinic review tool, if applicable. No additional management support is needed unless otherwise documented below in the visit note. 

## 2015-10-13 NOTE — Patient Instructions (Addendum)
Thank you for choosing [facility].  Summary/Instructions:  Follow up with wound care as scheduled.   If your symptoms worsen or fail to improve, please contact our office for further instruction, or in case of emergency go directly to the emergency room at the closest medical facility.   Please continue with wound care as discussed.

## 2015-10-13 NOTE — Telephone Encounter (Signed)
appt already made for today @ 9:45 w/Greg Calone...Johny Chess

## 2015-10-13 NOTE — Telephone Encounter (Signed)
Patient Name: [name] DOB: 12/13/1918 Initial Comment Caller states her mother has a wound on her leg, seen last Friday. She has appointment tomorrow @ 8:00AM. Skin is so thin, top layer of wound is opened. Nurse Assessment Nurse: Marcelline Deist, RN, Lynda Date/Time (Eastern Time): 10/13/2015 8:37:34 AM Confirm and document reason for call. If symptomatic, describe symptoms. ---Caller states her mother has a wound (hematoma) on the calf her right leg, as well as a small wound on shin, seen last Friday. She has appointment tomorrow @ 8:00AM with wound care. Skin is so thin, top layer of wound is opened, started to bleed/ oozing, dressed it today. She is on Coumadin. Was seen in ER the week before Thanksgiving, kept until Sat., seen by wound care specialist. Was seen at [facility] before Thanksgiving. Had an appt. Monday with Coumadin clinic. Is on Coumadin. She also has a bad cough, was ordered Doxycycline which she started last night. Has the patient traveled out of the country within the last 30 days? ---Not Applicable Does the patient have any new or worsening symptoms? ---Yes Will a triage be completed? ---Yes Related visit to physician within the last 2 weeks? ---Yes Does the PT have any chronic conditions? (i.e. diabetes, asthma, etc.) ---Yes List chronic conditions. ---A Fib, on Coumadin Is this a behavioral health call? ---No Guidelines Guideline Title Affirmed Question Affirmed Notes Skin Injury [1] After 14 days AND [2] wound isn't healed Final Disposition User See Physician within [facility], RN, Kermit Balo Comments Caller is unsure what to do about Coumadin. She takes 5 mg every day (at night) except Tuesday which she takes 2.5 mg. Caller indicates that the wound is oozing, but has not come through the dressing. Advised to reinforce it if the bleeding worsens. The caller states the wound does not appear infected & her mother is not having pain. Referrals REFERRED  TO PCP OFFICE Disagree/Comply: Comply

## 2015-10-13 NOTE — Assessment & Plan Note (Signed)
Wound appears improved with no evidence of infection. Cleansed and wrapped without complication. Continue basic wound care and follow up with wound care appointment as scheduled.

## 2015-10-13 NOTE — Progress Notes (Signed)
Subjective:    Patient ID: [name], female    DOB: 1919-03-04, 81 y.o.   MRN: [id number]  Chief Complaint  Patient presents with  . Wound Check    HPI:  [name] is a 81 y.o. female who  has a past medical history of CAD (coronary artery disease); Ostium secundum type atrial septal defect; AV block; Venous insufficiency; Pure hypercholesterolemia; Diverticulosis of colon (without mention of hemorrhage); Overactive bladder; DJD (degenerative joint disease); LBP (low back pain); Chronic pain syndrome; Osteoporosis; and Anemia. and presents today for an acute office visit.  1.) Wound check - recently seen in the office for a delayed healing wound and traumatic hematoma. Patient contact [facility] and noted the wound was open and started to ooze and bleed today. She has restarted her Coumadin. Also started the doxycycline she was prescribed for illness. Has a follow up with the wound care clinic tomorrow.   Allergies  Allergen Reactions  . Amiodarone Hcl     REACTION: INTOL to Amiodarone in past  . Codeine Nausea And Vomiting  . Fentanyl     REACTION: causes nausea--dizziness  . Lamisil [Terbinafine Hcl]     Causes nausea     Current Outpatient Prescriptions on File Prior to Visit  Medication Sig Dispense Refill  . acetaminophen (TYLENOL) 500 MG tablet Take 650 mg by mouth every 8 (eight) hours as needed. Take with tramadol as needed for pain    . Ascorbic Acid (VITAMIN C) 1000 MG tablet Take 1,000 mg by mouth daily.      . Biotin 1000 MCG tablet Take 1,000 mcg by mouth daily.      . Calcium Carbonate-Vitamin D (CALCIUM 600+D) 600-400 MG-UNIT per tablet Take 2 tablets by mouth daily.      . carvedilol (COREG) 3.125 MG tablet Take 1 tablet (3.125 mg total) by mouth 2 (two) times daily.    . Cholecalciferol (VITAMIN D) 1000 UNITS capsule Take 1,000 Units by mouth daily.      . ferrous sulfate 325 (65 FE) MG tablet Take 325 mg by mouth daily with breakfast.     .  fluticasone (FLONASE) 50 MCG/ACT nasal spray Place 2 sprays into the nose daily as needed for allergies.     . furosemide (LASIX) 40 MG tablet Take 40-80 mg by mouth See admin instructions. Take 1 tab (40mg ) daily alternating with 2 tabs (80mg ) daily    . Glucosamine 500 MG CAPS Take 500 mg by mouth daily.     Marland Kitchen guaiFENesin (MUCINEX) 600 MG 12 hr tablet Take 1 tablet (600 mg total) by mouth 2 (two) times daily. 30 tablet 0  . Multiple Vitamins-Minerals (PRESERVISION AREDS) TABS Take 1 tablet by mouth daily.    . Omega-3 350 MG CAPS Take 1 capsule by mouth every evening.     Marland Kitchen omeprazole (PRILOSEC) 20 MG capsule Take 20 mg by mouth daily as needed (acid reflux).     . potassium chloride (K-DUR) 10 MEQ tablet Take 10 mEq by mouth 2 (two) times daily. And as needed when taking furosemide.    . tolterodine (DETROL) 2 MG tablet Take 2 mg by mouth 2 (two) times daily.    . traMADol (ULTRAM) 50 MG tablet Take 1 tablet (50 mg total) by mouth every 6 (six) hours as needed for moderate pain. 30 tablet 0  . vitamin A 8000 UNIT capsule Take 8,000 Units by mouth every other day.      . vitamin E  400 UNIT capsule Take 400 Units by mouth daily.      Marland Kitchen warfarin (COUMADIN) 5 MG tablet Take by mouth as directed by the coumadin clinic     No current facility-administered medications on file prior to visit.    Review of Systems  Constitutional: Negative for fever and chills.  Skin: Positive for wound.      Objective:    BP 140/60 mmHg  Pulse 83  Temp(Src) 97.7 F (36.5 C) (Oral)  Resp 18  Ht 4\' 11"  (1.499 m)  Wt 90 lb 8 oz (41.051 kg)  BMI 18.27 kg/m2  SpO2 97%  LMP  (LMP Unknown) Nursing note and vital signs reviewed.  Physical Exam  Constitutional: She is oriented to person, place, and time. She appears well-developed and well-nourished. No distress.  Cardiovascular: Normal rate, regular rhythm, normal heart sounds and intact distal pulses.   Pulmonary/Chest: Effort normal and breath sounds  normal.  Neurological: She is alert and oriented to person, place, and time.  Skin: Skin is warm and dry.     Psychiatric: She has a normal mood and affect. Her behavior is normal. Judgment and thought content normal.       Assessment & Plan:   Problem List Items Addressed This Visit      Musculoskeletal and Integument   Wound of skin    Wound appears improved with no evidence of infection. Cleansed and wrapped without complication. Continue basic wound care and follow up with wound care appointment as scheduled.         Other   Traumatic hematoma - Primary    Hematoma appears slightly smaller than previous note with some mild discharge. Skin cleansed and bandaged. Continue basic wound care at this time. Healing may be challenging given coumadin. Follow up with wound care as scheduled.

## 2015-10-14 ENCOUNTER — Encounter (HOSPITAL_BASED_OUTPATIENT_CLINIC_OR_DEPARTMENT_OTHER): Payer: Medicare Other | Attending: Internal Medicine

## 2015-10-14 DIAGNOSIS — Q211 Atrial septal defect: Secondary | ICD-10-CM | POA: Diagnosis not present

## 2015-10-14 DIAGNOSIS — I10 Essential (primary) hypertension: Secondary | ICD-10-CM | POA: Insufficient documentation

## 2015-10-14 DIAGNOSIS — Z7901 Long term (current) use of anticoagulants: Secondary | ICD-10-CM | POA: Diagnosis not present

## 2015-10-14 DIAGNOSIS — S8011XA Contusion of right lower leg, initial encounter: Secondary | ICD-10-CM | POA: Diagnosis not present

## 2015-10-14 DIAGNOSIS — I4430 Unspecified atrioventricular block: Secondary | ICD-10-CM | POA: Insufficient documentation

## 2015-10-14 DIAGNOSIS — I872 Venous insufficiency (chronic) (peripheral): Secondary | ICD-10-CM | POA: Insufficient documentation

## 2015-10-14 DIAGNOSIS — I739 Peripheral vascular disease, unspecified: Secondary | ICD-10-CM | POA: Insufficient documentation

## 2015-10-14 DIAGNOSIS — I2510 Atherosclerotic heart disease of native coronary artery without angina pectoris: Secondary | ICD-10-CM | POA: Diagnosis not present

## 2015-10-14 DIAGNOSIS — I87331 Chronic venous hypertension (idiopathic) with ulcer and inflammation of right lower extremity: Secondary | ICD-10-CM | POA: Diagnosis not present

## 2015-10-14 DIAGNOSIS — L97811 Non-pressure chronic ulcer of other part of right lower leg limited to breakdown of skin: Secondary | ICD-10-CM | POA: Diagnosis not present

## 2015-10-14 DIAGNOSIS — W228XXA Striking against or struck by other objects, initial encounter: Secondary | ICD-10-CM | POA: Diagnosis not present

## 2015-10-18 ENCOUNTER — Other Ambulatory Visit: Payer: Self-pay | Admitting: Internal Medicine

## 2015-10-20 DIAGNOSIS — I2510 Atherosclerotic heart disease of native coronary artery without angina pectoris: Secondary | ICD-10-CM | POA: Diagnosis not present

## 2015-10-20 DIAGNOSIS — I87331 Chronic venous hypertension (idiopathic) with ulcer and inflammation of right lower extremity: Secondary | ICD-10-CM | POA: Diagnosis not present

## 2015-10-20 DIAGNOSIS — I10 Essential (primary) hypertension: Secondary | ICD-10-CM | POA: Diagnosis not present

## 2015-10-20 DIAGNOSIS — L97811 Non-pressure chronic ulcer of other part of right lower leg limited to breakdown of skin: Secondary | ICD-10-CM | POA: Diagnosis not present

## 2015-10-20 DIAGNOSIS — S8011XA Contusion of right lower leg, initial encounter: Secondary | ICD-10-CM | POA: Diagnosis not present

## 2015-10-20 DIAGNOSIS — I739 Peripheral vascular disease, unspecified: Secondary | ICD-10-CM | POA: Diagnosis not present

## 2015-10-25 ENCOUNTER — Ambulatory Visit (INDEPENDENT_AMBULATORY_CARE_PROVIDER_SITE_OTHER): Payer: Medicare Other | Admitting: *Deleted

## 2015-10-25 DIAGNOSIS — I4891 Unspecified atrial fibrillation: Secondary | ICD-10-CM | POA: Diagnosis not present

## 2015-10-25 DIAGNOSIS — Z5181 Encounter for therapeutic drug level monitoring: Secondary | ICD-10-CM

## 2015-10-25 LAB — POCT INR: INR: 2.9

## 2015-10-27 DIAGNOSIS — I739 Peripheral vascular disease, unspecified: Secondary | ICD-10-CM | POA: Diagnosis not present

## 2015-10-27 DIAGNOSIS — I2510 Atherosclerotic heart disease of native coronary artery without angina pectoris: Secondary | ICD-10-CM | POA: Diagnosis not present

## 2015-10-27 DIAGNOSIS — I10 Essential (primary) hypertension: Secondary | ICD-10-CM | POA: Diagnosis not present

## 2015-10-27 DIAGNOSIS — S8011XA Contusion of right lower leg, initial encounter: Secondary | ICD-10-CM | POA: Diagnosis not present

## 2015-10-27 DIAGNOSIS — I87331 Chronic venous hypertension (idiopathic) with ulcer and inflammation of right lower extremity: Secondary | ICD-10-CM | POA: Diagnosis not present

## 2015-10-27 DIAGNOSIS — L97811 Non-pressure chronic ulcer of other part of right lower leg limited to breakdown of skin: Secondary | ICD-10-CM | POA: Diagnosis not present

## 2015-11-02 DIAGNOSIS — I495 Sick sinus syndrome: Secondary | ICD-10-CM | POA: Diagnosis not present

## 2015-11-02 DIAGNOSIS — Z950 Presence of cardiac pacemaker: Secondary | ICD-10-CM | POA: Diagnosis not present

## 2015-11-03 ENCOUNTER — Encounter: Payer: Self-pay | Admitting: Family

## 2015-11-03 ENCOUNTER — Inpatient Hospital Stay: Payer: Medicare Other | Admitting: Family

## 2015-11-03 ENCOUNTER — Ambulatory Visit (INDEPENDENT_AMBULATORY_CARE_PROVIDER_SITE_OTHER)
Admission: RE | Admit: 2015-11-03 | Discharge: 2015-11-03 | Disposition: A | Discharge status: Home / Self Care | Payer: Medicare Other | Source: Ambulatory Visit | Attending: Family | Admitting: Family

## 2015-11-03 ENCOUNTER — Ambulatory Visit (INDEPENDENT_AMBULATORY_CARE_PROVIDER_SITE_OTHER): Payer: Medicare Other | Admitting: Family

## 2015-11-03 VITALS — BP 126/80 | HR 77 | Temp 97.8°F | Resp 18 | Ht 59.0 in | Wt 86.1 lb

## 2015-11-03 DIAGNOSIS — I87331 Chronic venous hypertension (idiopathic) with ulcer and inflammation of right lower extremity: Secondary | ICD-10-CM | POA: Diagnosis not present

## 2015-11-03 DIAGNOSIS — L97811 Non-pressure chronic ulcer of other part of right lower leg limited to breakdown of skin: Secondary | ICD-10-CM | POA: Diagnosis not present

## 2015-11-03 DIAGNOSIS — R0781 Pleurodynia: Secondary | ICD-10-CM

## 2015-11-03 DIAGNOSIS — I739 Peripheral vascular disease, unspecified: Secondary | ICD-10-CM | POA: Diagnosis not present

## 2015-11-03 DIAGNOSIS — I2510 Atherosclerotic heart disease of native coronary artery without angina pectoris: Secondary | ICD-10-CM | POA: Diagnosis not present

## 2015-11-03 DIAGNOSIS — S8011XA Contusion of right lower leg, initial encounter: Secondary | ICD-10-CM | POA: Diagnosis not present

## 2015-11-03 DIAGNOSIS — I10 Essential (primary) hypertension: Secondary | ICD-10-CM | POA: Diagnosis not present

## 2015-11-03 NOTE — Patient Instructions (Signed)
Thank you for choosing [facility].  Summary/Instructions:  Please stop by radiology on the basement level of the building for your x-rays. Your results will be released to [place] (or called to you) after review, usually within 72 hours after test completion. If any treatments or changes are necessary, you will be notified at that same time.  If your symptoms worsen or fail to improve, please contact our office for further instruction, or in case of emergency go directly to the emergency room at the closest medical facility.   Moist heat 2-3 times per or as needed. Continue with the Tylenol and Tramdol.   Splinting with deep breathing and coughing.   Mid-Back Strain With Rehab  A strain is an injury in which a tendon or muscle is torn. The muscles and tendons of the mid-back are vulnerable to strains. However, these muscles and tendons are very strong and require a great force to be injured. The muscles of the mid-back are responsible for stabilizing the spinal column, as well as spinal twisting (rotation). Strains are classified into three categories. Grade 1 strains cause pain, but the tendon is not lengthened. Grade 2 strains include a lengthened ligament, due to the ligament being stretched or partially ruptured. With grade 2 strains there is still function, although the function may be decreased. Grade 3 strains involve a complete tear of the tendon or muscle, and function is usually impaired. SYMPTOMS   Pain in the middle of the back.  Pain that may affect only one side, and is worse with movement.  Muscle spasms, and often swelling in the back.  Loss of strength of the back muscles.  Crackling sound (crepitation) when the muscles are touched. CAUSES  Mid-back strains occur when a force is placed on the muscles or tendons that is greater than they can handle. Common causes of injury include:  Ongoing overuse of the muscle-tendon units in the middle back, usually from  incorrect body posture.  A single violent injury or force applied to the back. RISK INCREASES WITH:  Sports that involve twisting forces on the spine or a lot of bending at the waist (football, rugby, weightlifting, bowling, golf, tennis, speed skating, racquetball, swimming, running, gymnastics, diving).  Poor strength and flexibility.  Failure to warm up properly before activity.  Family history of low back pain or disk disorders.  Previous back injury or surgery (especially fusion). PREVENTION  Learn and use proper sports technique.  Warm up and stretch properly before activity.  Allow for adequate recovery between workouts.  Maintain physical fitness:  Strength, flexibility, and endurance.  Cardiovascular fitness. PROGNOSIS  If treated properly, mid-back strains usually heal within 6 weeks. RELATED COMPLICATIONS   Frequently recurring symptoms, resulting in a chronic problem. Properly treating the problem the first time decreases frequency of recurrence.  Chronic inflammation, scarring, and partial muscle-tendon tear.  Delayed healing or resolution of symptoms, especially if activity is resumed too soon.  Prolonged disability. TREATMENT Treatment first involves the use of ice and medicine, to reduce pain and inflammation. As the pain begins to subside, you may begin strengthening and stretching exercises to improve body posture and sport technique. These exercises may be performed at home or with a therapist. Severe injuries may require referral to a therapist for further evaluation and treatment, such as ultrasound. Corticosteroid injections may be given to help reduce inflammation. Biofeedback (watching monitors of your body processes) and psychotherapy may also be prescribed. Prolonged bed rest is felt to do more harm than  good. Massage may help break the muscle spasms. Sometimes, an injection of cortisone, with or without local anesthetics, may be given to help relieve  the pain and spasms. MEDICATION   If pain medicine is needed, nonsteroidal anti-inflammatory medicines (aspirin and ibuprofen), or other minor pain relievers (acetaminophen), are often advised.  Do not take pain medicine for 7 days before surgery.  Prescription pain relievers may be given, if your caregiver thinks they are needed. Use only as directed and only as much as you need.  Ointments applied to the skin may be helpful.  Corticosteroid injections may be given by your caregiver. These injections should be reserved for the most serious cases, because they may only be given a certain number of times. HEAT AND COLD:   Cold treatment (icing) should be applied for 10 to 15 minutes every 2 to 3 hours for inflammation and pain, and immediately after activity that aggravates your symptoms. Use ice packs or an ice massage.  Heat treatment may be used before performing stretching and strengthening activities prescribed by your caregiver, physical therapist, or athletic trainer. Use a heat pack or a warm water soak. SEEK IMMEDIATE MEDICAL CARE IF:  Symptoms get worse or do not improve in 2 to 4 weeks, despite treatment.  You develop numbness, weakness, or loss of bowel or bladder function.  New, unexplained symptoms develop. (Drugs used in treatment may produce side effects.) EXERCISES RANGE OF MOTION (ROM) AND STRETCHING EXERCISES - Mid-Back Strain These exercises may help you when beginning to rehabilitate your injury. In order to successfully resolve your symptoms, you must improve your posture. These exercises are designed to help reduce the forward-head and rounded-shoulder posture which contributes to this condition. Your symptoms may resolve with or without further involvement from your physician, physical therapist or athletic trainer. While completing these exercises, remember:   Restoring tissue flexibility helps normal motion to return to the joints. This allows healthier, less  painful movement and activity.  An effective stretch should be held for at least 30 seconds.  A stretch should never be painful. You should only feel a gentle lengthening or release in the stretched tissue. STRETCH - Axial Extension  Stand or sit on a firm surface. Assume a good posture: chest up, shoulders drawn back, stomach muscles slightly tense, knees unlocked (if standing) and feet hip width apart.  Slowly retract your chin, so your head slides back and your chin slightly lowers. Continue to look straight ahead.  You should feel a gentle stretch in the back of your head. Be certain not to feel an aggressive stretch since this can cause headaches later.  Hold for __________ seconds. Repeat __________ times. Complete this exercise __________ times per day. RANGE OF MOTION- Upper Thoracic Extension  Sit on a firm chair with a high back. Assume a good posture: chest up, shoulders drawn back, abdominal muscles slightly tense, and feet hip width apart. Place a small pillow or folded towel in the curve of your lower back, if you are having difficulty maintaining good posture.  Gently brace your neck with your hands, allowing your arms to rest on your chest.  Continue to support your neck and slowly extend your back over the chair. You will feel a stretch across your upper back.  Hold __________ seconds. Slowly return to the starting position. Repeat __________ times. Complete this exercise __________ times per day. RANGE OF MOTION- Mid-Thoracic Extension  Roll a towel so that it is about 4 inches in diameter.  Position  the towel lengthwise. Lay on the towel so that your spine, but not your shoulder blades, are supported.  You should feel your mid-back arching toward the floor. To increase the stretch, extend your arms away from your body.  Hold for __________ seconds. Repeat exercise __________ times, __________ times per day. STRENGTHENING EXERCISES - Mid-Back Strain These  exercises may help you when beginning to rehabilitate your injury. They may resolve your symptoms with or without further involvement from your physician, physical therapist or athletic trainer. While completing these exercises, remember:   Muscles can gain both the endurance and the strength needed for everyday activities through controlled exercises.  Complete these exercises as instructed by your physician, physical therapist or athletic trainer. Increase the resistance and repetitions only as guided by your caregiver.  You may experience muscle soreness or fatigue, but the pain or discomfort you are trying to eliminate should never worsen during these exercises. If this pain does worsen, stop and make certain you are following the directions exactly. If the pain is still present after adjustments, discontinue the exercise until you can discuss the trouble with your caregiver. STRENGTHENING - Quadruped, Opposite UE/LE Lift  Assume a hands and knees position on a firm surface. Keep your hands under your shoulders and your knees under your hips. You may place padding under your knees for comfort.  Find your neutral spine and gently tense your abdominal muscles so that you can maintain this position. Your shoulders and hips should form a rectangle that is parallel with the floor and is not twisted.  Keeping your trunk steady, lift your right hand no higher than your shoulder and then your left leg no higher than your hip. Make sure you are not holding your breath. Hold this position __________ seconds.  Continuing to keep your abdominal muscles tense and your back steady, slowly return to your starting position. Repeat with the opposite arm and leg. Repeat __________ times. Complete this exercise __________ times per day.  STRENGTH - Shoulder Extensors  Secure a rubber exercise band or tubing to a fixed object (table, pole) so that it is at the height of your shoulders when you are either  standing, or sitting on a firm armless chair.  With a thumbs-up grip, grasp an end of the band in each hand. Straighten your elbows and lift your hands straight in front of you at shoulder height. Step back away from the secured end of band, until it becomes tense.  Squeezing your shoulder blades together, pull your hands down to the sides of your thighs. Do not allow your hands to go behind you.  Hold for __________ seconds. Slowly ease the tension on the band, as you reverse the directions and return to the starting position. Repeat __________ times. Complete this exercise __________ times per day.  STRENGTH - Horizontal Abductors Choose one of the two positions to complete this exercise. Prone: lying on stomach:  Lie on your stomach on a firm surface so that your right / left arm overhangs the edge. Rest your forehead on your opposite forearm. With your palm facing the floor and your elbow straight, hold a __________ weight in your hand.  Squeeze your right / left shoulder blade to your mid-back spine and then slowly raise your arm to the height of the bed.  Hold for __________ seconds. Slowly reverse the directions and return to the starting position, controlling the weight as you lower your arm. Repeat __________ times. Complete this exercise __________ times per  day. Standing:   Secure a rubber exercise band or tubing, so that it is at the height of your shoulders when you are either standing, or sitting on a firm armless chair.  Grasp an end of the band in each hand and have your palms face each other. Straighten your elbows and lift your hands straight in front of you at shoulder height. Step back away from the secured end of band, until it becomes tense.  Squeeze your shoulder blades together. Keeping your elbows locked and your hands at shoulder height, spread your arms apart, forming a "T" shape with your body. Hold __________ seconds. Slowly ease the tension on the band, as you  reverse the directions and return to the starting position. Repeat __________ times. Complete this exercise __________ times per day. STRENGTH - Scapular Retractors and External Rotators, Rowing  Secure a rubber exercise band or tubing, so that it is at the height of your shoulders when you are either standing, or sitting on a firm armless chair.  With a palm-down grip, grasp an end of the band in each hand. Straighten your elbows and lift your hands straight in front of you at shoulder height. Step back away from the secured end of band, until it becomes tense.  Step 1: Squeeze your shoulder blades together. Bending your elbows, draw your hands to your chest as if you are rowing a boat. At the end of this motion, your hands and elbow should be at shoulder height and your elbows should be out to your sides.  Step 2: Rotate your shoulder to raise your hands above your head. Your forearms should be vertical and your upper arms should be horizontal.  Hold for __________ seconds. Slowly ease the tension on the band, as you reverse the directions and return to the starting position. Repeat __________ times. Complete this exercise __________ times per day.  POSTURE AND BODY MECHANICS CONSIDERATIONS - Mid-Back Strain Keeping correct posture when sitting, standing or completing your activities will reduce the stress put on different body tissues, allowing injured tissues a chance to heal and limiting painful experiences. The following are general guidelines for improved posture. Your physician or physical therapist will provide you with any instructions specific to your needs. While reading these guidelines, remember:  The exercises prescribed by your provider will help you have the flexibility and strength to maintain correct postures.  The correct posture provides the best environment for your joints to work. All of your joints have less wear and tear when properly supported by a spine with good  posture. This means you will experience a healthier, less painful body.  Correct posture must be practiced with all of your activities, especially prolonged sitting and standing. Correct posture is as important when doing repetitive low-stress activities (typing) as it is when doing a single heavy-load activity (lifting). PROPER SITTING POSTURE In order to minimize stress and discomfort on your spine, you must sit with correct posture. Sitting with good posture should be effortless for a healthy body. Returning to good posture is a gradual process. Many people can work toward this most comfortably by using various supports until they have the flexibility and strength to maintain this posture on their own. When sitting with proper posture, your ears will fall over your shoulders and your shoulders will fall over your hips. You should use the back of the chair to support your upper back. Your lower back will be in a neutral position, just slightly arched. You may place  a small pillow or folded towel at the base of your low back for  support.  When working at a desk, create an environment that supports good, upright posture. Without extra support, muscles fatigue and lead to excessive strain on joints and other tissues. Keep these recommendations in mind: CHAIR:  A chair should be able to slide under your desk when your back makes contact with the back of the chair. This allows you to work closely.  The chair's height should allow your eyes to be level with the upper part of your monitor and your hands to be slightly lower than your elbows. BODY POSITION  Your feet should make contact with the floor. If this is not possible, use a foot rest.  Keep your ears over your shoulders. This will reduce stress on your neck and lower back. INCORRECT SITTING POSTURES If you are feeling tired and unable to assume a healthy sitting posture, do not slouch or slump. This puts excessive strain on your back tissues,  causing more damage and pain. Healthier options include:  Using more support, like a lumbar pillow.  Switching tasks to something that requires you to be upright or walking.  Talking a brief walk.  Lying down to rest in a neutral-spine position. CORRECT STANDING POSTURES Proper standing posture should be assumed with all daily activities, even if they only take a few moments, like when brushing your teeth. As in sitting, your ears should fall over your shoulders and your shoulders should fall over your hips. You should keep a slight tension in your abdominal muscles to brace your spine. Your tailbone should point down to the ground, not behind your body, resulting in an over-extended swayback posture.  INCORRECT STANDING POSTURES Common incorrect standing postures include a forward head, locked knees, and an excessive swayback. WALKING Walk with an upright posture. Your ears, shoulders and hips should all line-up. CORRECT LIFTING TECHNIQUES DO :   Assume a wide stance. This will provide you more stability and the opportunity to get as close as possible to the object which you are lifting.  Tense your abdominals to brace your spine. Bend at the knees and hips. Keeping your back locked in a neutral-spine position, lift using your leg muscles. Lift with your legs, keeping your back straight.  Test the weight of unknown objects before attempting to lift them.  Try to keep your elbows locked down at your sides in order get the best strength from your shoulders when carrying an object.  Always ask for help when lifting heavy or awkward objects. INCORRECT LIFTING TECHNIQUES DO NOT:   Lock your knees when lifting, even if it is a small object.  Bend and twist. Pivot at your feet or move your feet when needing to change directions.  Assume that you can safely pick up even a paperclip without proper posture.   This information is not intended to replace advice given to you by your health  care provider. Make sure you discuss any questions you have with your health care provider.   Document Released: 10/29/2005 Document Revised: 03/15/2015 Document Reviewed: 02/10/2009 Elsevier Interactive Patient Education [employer].

## 2015-11-03 NOTE — Progress Notes (Signed)
Subjective:    Patient ID: [name], female    DOB: 07-19-1919, 81 y.o.   MRN: [id number]  Chief Complaint  Patient presents with  . Back Pain    x2 weeks has had pain in back and left side that she didn't have until she had a bad cough    HPI:  [name] is a 81 y.o. female who  has a past medical history of CAD (coronary artery disease); Ostium secundum type atrial septal defect; AV block; Venous insufficiency; Pure hypercholesterolemia; Diverticulosis of colon (without mention of hemorrhage); Overactive bladder; DJD (degenerative joint disease); LBP (low back pain); Chronic pain syndrome; Osteoporosis; and Anemia. and presents today for an acute office visit.   This is a new problem. Associated symptom of pain located in her upper back and left side has been going on for about 2 weeks. Describes the pain as uncomfortable. Modifying factors including sitting down and lying relieve the pain. Notes that she has had a significant cough over the past month which may be contributing to it. When she reached over she felt a pull/pop. Does not disturb her sleep. Gets worse with increased activity and with really deep breaths. Modifying factors include the Tramadol which does not help.    Allergies  Allergen Reactions  . Amiodarone Hcl     REACTION: INTOL to Amiodarone in past  . Codeine Nausea And Vomiting  . Fentanyl     REACTION: causes nausea--dizziness  . Lamisil [Terbinafine Hcl]     Causes nausea     Current Outpatient Prescriptions on File Prior to Visit  Medication Sig Dispense Refill  . acetaminophen (TYLENOL) 500 MG tablet Take 650 mg by mouth every 8 (eight) hours as needed. Take with tramadol as needed for pain    . Ascorbic Acid (VITAMIN C) 1000 MG tablet Take 1,000 mg by mouth daily.      . Biotin 1000 MCG tablet Take 1,000 mcg by mouth daily.      . Calcium Carbonate-Vitamin D (CALCIUM 600+D) 600-400 MG-UNIT per tablet Take 2 tablets by mouth daily.      .  carvedilol (COREG) 3.125 MG tablet Take 1 tablet (3.125 mg total) by mouth 2 (two) times daily.    . Cholecalciferol (VITAMIN D) 1000 UNITS capsule Take 1,000 Units by mouth daily.      . ferrous sulfate 325 (65 FE) MG tablet Take 325 mg by mouth daily with breakfast.     . fluticasone (FLONASE) 50 MCG/ACT nasal spray Place 2 sprays into the nose daily as needed for allergies.     . furosemide (LASIX) 40 MG tablet Take 40mg  (one tablet) every other day alternating 80mg  (two tablets) every other day. 135 tablet 0  . Glucosamine 500 MG CAPS Take 500 mg by mouth daily.     Marland Kitchen guaiFENesin (MUCINEX) 600 MG 12 hr tablet Take 1 tablet (600 mg total) by mouth 2 (two) times daily. 30 tablet 0  . Multiple Vitamins-Minerals (PRESERVISION AREDS) TABS Take 1 tablet by mouth daily.    . Omega-3 350 MG CAPS Take 1 capsule by mouth every evening.     Marland Kitchen omeprazole (PRILOSEC) 20 MG capsule Take 20 mg by mouth daily as needed (acid reflux).     . potassium chloride (K-DUR) 10 MEQ tablet Take 10 mEq by mouth 2 (two) times daily. And as needed when taking furosemide.    . tolterodine (DETROL) 2 MG tablet Take 2 mg by mouth 2 (two) times  daily.    . traMADol (ULTRAM) 50 MG tablet Take 1 tablet (50 mg total) by mouth every 6 (six) hours as needed for moderate pain. 30 tablet 0  . vitamin A 8000 UNIT capsule Take 8,000 Units by mouth every other day.      . vitamin E 400 UNIT capsule Take 400 Units by mouth daily.      Marland Kitchen warfarin (COUMADIN) 5 MG tablet Take by mouth as directed by the coumadin clinic     No current facility-administered medications on file prior to visit.     Past Surgical History  Procedure Laterality Date  . Vesicovaginal fistula closure w/ tah  1973  . Cataract extraction    . Asd repair    . Pacemaker placement  [place]  . Inguinal hernia repair  2011    bilaterally  . Shoulder surgery      x 2  . Ganglion cyst excision    . Partial hysterectomy      Past Medical History    Diagnosis Date  . CAD (coronary artery disease)   . Ostium secundum type atrial septal defect   . AV block     s/p PPM  . Venous insufficiency   . Pure hypercholesterolemia   . Diverticulosis of colon (without mention of hemorrhage)   . Overactive bladder   . DJD (degenerative joint disease)   . LBP (low back pain)   . Chronic pain syndrome   . Osteoporosis     pelvic fx 10/2013 s/p fall  . Anemia     Review of Systems  Constitutional: Negative for fever and chills.  Respiratory: Negative for cough.   Musculoskeletal:       Positive for upper back and left side pain.  Neurological: Negative for weakness and numbness.      Objective:    BP 126/80 mmHg  Pulse 77  Temp(Src) 97.8 F (36.6 C) (Oral)  Resp 18  Ht 4\' 11"  (1.499 m)  Wt 86 lb 1.9 oz (39.064 kg)  BMI 17.38 kg/m2  SpO2 97%  LMP  (LMP Unknown) Nursing note and vital signs reviewed.  Physical Exam  Constitutional: She is oriented to person, place, and time. She appears well-developed and well-nourished. No distress.  Cardiovascular: Normal rate, regular rhythm, normal heart sounds and intact distal pulses.   Pulmonary/Chest: Effort normal and breath sounds normal.  Musculoskeletal:  Left ribs - no obvious deformity, discoloration, or edema. Mild generalized tenderness located in her mid left sided ribs. Rib compression test negative.  Neurological: She is alert and oriented to person, place, and time.  Skin: Skin is warm and dry.  Psychiatric: She has a normal mood and affect. Her behavior is normal. Judgment and thought content normal.       Assessment & Plan:   Problem List Items Addressed This Visit      Other   Rib pain on left side - Primary    Symptoms and exam consistent with possible rib strain/sprain, or concern for fracture remains. Obtain x-rays. Treat conservatively with pillow splinting as needed and deep breathing to prevent pneumonia. Continue previously prescribed tramadol as needed for  pain. Concern for additional pain medication as increased risk for sedation may increase risk for pneumonia. Follow-up if symptoms worsen and pending x-ray results.      Relevant Orders   DG Ribs Unilateral Left (Completed)

## 2015-11-03 NOTE — Progress Notes (Signed)
Pre visit review using our clinic review tool, if applicable. No additional management support is needed unless otherwise documented below in the visit note. 

## 2015-11-04 ENCOUNTER — Encounter: Payer: Self-pay | Admitting: Family

## 2015-11-04 NOTE — Assessment & Plan Note (Addendum)
Symptoms and exam consistent with possible rib strain/sprain, or concern for fracture remains. Obtain x-rays. Treat conservatively with pillow splinting as needed and deep breathing to prevent pneumonia. Continue previously prescribed tramadol as needed for pain. Concern for additional pain medication as increased risk for sedation may increase risk for pneumonia. Follow-up if symptoms worsen and pending x-ray results.

## 2015-11-08 ENCOUNTER — Ambulatory Visit (INDEPENDENT_AMBULATORY_CARE_PROVIDER_SITE_OTHER): Payer: Medicare Other | Admitting: Pharmacist

## 2015-11-08 DIAGNOSIS — I4891 Unspecified atrial fibrillation: Secondary | ICD-10-CM | POA: Diagnosis not present

## 2015-11-08 DIAGNOSIS — Z5181 Encounter for therapeutic drug level monitoring: Secondary | ICD-10-CM

## 2015-11-08 LAB — POCT INR: INR: 3.7

## 2015-11-10 DIAGNOSIS — I739 Peripheral vascular disease, unspecified: Secondary | ICD-10-CM | POA: Diagnosis not present

## 2015-11-10 DIAGNOSIS — S8011XA Contusion of right lower leg, initial encounter: Secondary | ICD-10-CM | POA: Diagnosis not present

## 2015-11-10 DIAGNOSIS — I10 Essential (primary) hypertension: Secondary | ICD-10-CM | POA: Diagnosis not present

## 2015-11-10 DIAGNOSIS — I2510 Atherosclerotic heart disease of native coronary artery without angina pectoris: Secondary | ICD-10-CM | POA: Diagnosis not present

## 2015-11-10 DIAGNOSIS — L97811 Non-pressure chronic ulcer of other part of right lower leg limited to breakdown of skin: Secondary | ICD-10-CM | POA: Diagnosis not present

## 2015-11-10 DIAGNOSIS — I87331 Chronic venous hypertension (idiopathic) with ulcer and inflammation of right lower extremity: Secondary | ICD-10-CM | POA: Diagnosis not present

## 2015-11-17 ENCOUNTER — Encounter (HOSPITAL_BASED_OUTPATIENT_CLINIC_OR_DEPARTMENT_OTHER): Payer: Medicare Other | Attending: Internal Medicine

## 2015-11-17 DIAGNOSIS — X58XXXA Exposure to other specified factors, initial encounter: Secondary | ICD-10-CM | POA: Diagnosis not present

## 2015-11-17 DIAGNOSIS — I739 Peripheral vascular disease, unspecified: Secondary | ICD-10-CM | POA: Insufficient documentation

## 2015-11-17 DIAGNOSIS — I2510 Atherosclerotic heart disease of native coronary artery without angina pectoris: Secondary | ICD-10-CM | POA: Insufficient documentation

## 2015-11-17 DIAGNOSIS — L97812 Non-pressure chronic ulcer of other part of right lower leg with fat layer exposed: Secondary | ICD-10-CM | POA: Insufficient documentation

## 2015-11-17 DIAGNOSIS — S8011XA Contusion of right lower leg, initial encounter: Secondary | ICD-10-CM | POA: Insufficient documentation

## 2015-11-17 DIAGNOSIS — A4901 Methicillin susceptible Staphylococcus aureus infection, unspecified site: Secondary | ICD-10-CM | POA: Diagnosis not present

## 2015-11-17 DIAGNOSIS — I10 Essential (primary) hypertension: Secondary | ICD-10-CM | POA: Insufficient documentation

## 2015-11-17 DIAGNOSIS — I87311 Chronic venous hypertension (idiopathic) with ulcer of right lower extremity: Secondary | ICD-10-CM | POA: Diagnosis not present

## 2015-11-17 DIAGNOSIS — I87331 Chronic venous hypertension (idiopathic) with ulcer and inflammation of right lower extremity: Secondary | ICD-10-CM | POA: Insufficient documentation

## 2015-11-17 DIAGNOSIS — L97811 Non-pressure chronic ulcer of other part of right lower leg limited to breakdown of skin: Secondary | ICD-10-CM | POA: Diagnosis not present

## 2015-11-18 ENCOUNTER — Other Ambulatory Visit: Payer: Self-pay | Admitting: Internal Medicine

## 2015-11-23 ENCOUNTER — Ambulatory Visit (INDEPENDENT_AMBULATORY_CARE_PROVIDER_SITE_OTHER): Payer: Medicare Other | Admitting: *Deleted

## 2015-11-23 DIAGNOSIS — Z5181 Encounter for therapeutic drug level monitoring: Secondary | ICD-10-CM

## 2015-11-23 DIAGNOSIS — I4891 Unspecified atrial fibrillation: Secondary | ICD-10-CM

## 2015-11-23 LAB — POCT INR: INR: 3.6

## 2015-11-24 DIAGNOSIS — S8011XA Contusion of right lower leg, initial encounter: Secondary | ICD-10-CM | POA: Diagnosis not present

## 2015-11-24 DIAGNOSIS — L97812 Non-pressure chronic ulcer of other part of right lower leg with fat layer exposed: Secondary | ICD-10-CM | POA: Diagnosis not present

## 2015-11-24 DIAGNOSIS — I2510 Atherosclerotic heart disease of native coronary artery without angina pectoris: Secondary | ICD-10-CM | POA: Diagnosis not present

## 2015-11-24 DIAGNOSIS — I10 Essential (primary) hypertension: Secondary | ICD-10-CM | POA: Diagnosis not present

## 2015-11-24 DIAGNOSIS — I87331 Chronic venous hypertension (idiopathic) with ulcer and inflammation of right lower extremity: Secondary | ICD-10-CM | POA: Diagnosis not present

## 2015-11-24 DIAGNOSIS — L97811 Non-pressure chronic ulcer of other part of right lower leg limited to breakdown of skin: Secondary | ICD-10-CM | POA: Diagnosis not present

## 2015-11-24 DIAGNOSIS — I87321 Chronic venous hypertension (idiopathic) with inflammation of right lower extremity: Secondary | ICD-10-CM | POA: Diagnosis not present

## 2015-11-24 DIAGNOSIS — I739 Peripheral vascular disease, unspecified: Secondary | ICD-10-CM | POA: Diagnosis not present

## 2015-11-28 ENCOUNTER — Ambulatory Visit (INDEPENDENT_AMBULATORY_CARE_PROVIDER_SITE_OTHER): Payer: Medicare Other | Admitting: *Deleted

## 2015-11-28 DIAGNOSIS — I4891 Unspecified atrial fibrillation: Secondary | ICD-10-CM | POA: Diagnosis not present

## 2015-11-28 DIAGNOSIS — Z5181 Encounter for therapeutic drug level monitoring: Secondary | ICD-10-CM

## 2015-11-28 LAB — POCT INR: INR: 1.9

## 2015-12-02 ENCOUNTER — Ambulatory Visit (INDEPENDENT_AMBULATORY_CARE_PROVIDER_SITE_OTHER): Payer: Medicare Other | Admitting: *Deleted

## 2015-12-02 DIAGNOSIS — I10 Essential (primary) hypertension: Secondary | ICD-10-CM | POA: Diagnosis not present

## 2015-12-02 DIAGNOSIS — S8011XA Contusion of right lower leg, initial encounter: Secondary | ICD-10-CM | POA: Diagnosis not present

## 2015-12-02 DIAGNOSIS — I739 Peripheral vascular disease, unspecified: Secondary | ICD-10-CM | POA: Diagnosis not present

## 2015-12-02 DIAGNOSIS — I2510 Atherosclerotic heart disease of native coronary artery without angina pectoris: Secondary | ICD-10-CM | POA: Diagnosis not present

## 2015-12-02 DIAGNOSIS — Z5181 Encounter for therapeutic drug level monitoring: Secondary | ICD-10-CM

## 2015-12-02 DIAGNOSIS — I4891 Unspecified atrial fibrillation: Secondary | ICD-10-CM

## 2015-12-02 DIAGNOSIS — L97812 Non-pressure chronic ulcer of other part of right lower leg with fat layer exposed: Secondary | ICD-10-CM | POA: Diagnosis not present

## 2015-12-02 DIAGNOSIS — S81801A Unspecified open wound, right lower leg, initial encounter: Secondary | ICD-10-CM | POA: Diagnosis not present

## 2015-12-02 DIAGNOSIS — I87331 Chronic venous hypertension (idiopathic) with ulcer and inflammation of right lower extremity: Secondary | ICD-10-CM | POA: Diagnosis not present

## 2015-12-02 LAB — POCT INR: INR: 2

## 2015-12-04 ENCOUNTER — Other Ambulatory Visit: Payer: Self-pay | Admitting: Internal Medicine

## 2015-12-06 DIAGNOSIS — I872 Venous insufficiency (chronic) (peripheral): Secondary | ICD-10-CM | POA: Diagnosis not present

## 2015-12-06 DIAGNOSIS — Z7901 Long term (current) use of anticoagulants: Secondary | ICD-10-CM | POA: Diagnosis not present

## 2015-12-06 DIAGNOSIS — S81811D Laceration without foreign body, right lower leg, subsequent encounter: Secondary | ICD-10-CM | POA: Diagnosis not present

## 2015-12-06 DIAGNOSIS — I4891 Unspecified atrial fibrillation: Secondary | ICD-10-CM | POA: Diagnosis not present

## 2015-12-06 DIAGNOSIS — I2510 Atherosclerotic heart disease of native coronary artery without angina pectoris: Secondary | ICD-10-CM | POA: Diagnosis not present

## 2015-12-09 DIAGNOSIS — I739 Peripheral vascular disease, unspecified: Secondary | ICD-10-CM | POA: Diagnosis not present

## 2015-12-09 DIAGNOSIS — I2510 Atherosclerotic heart disease of native coronary artery without angina pectoris: Secondary | ICD-10-CM | POA: Diagnosis not present

## 2015-12-09 DIAGNOSIS — I87321 Chronic venous hypertension (idiopathic) with inflammation of right lower extremity: Secondary | ICD-10-CM | POA: Diagnosis not present

## 2015-12-09 DIAGNOSIS — S8011XA Contusion of right lower leg, initial encounter: Secondary | ICD-10-CM | POA: Diagnosis not present

## 2015-12-09 DIAGNOSIS — I10 Essential (primary) hypertension: Secondary | ICD-10-CM | POA: Diagnosis not present

## 2015-12-09 DIAGNOSIS — I87331 Chronic venous hypertension (idiopathic) with ulcer and inflammation of right lower extremity: Secondary | ICD-10-CM | POA: Diagnosis not present

## 2015-12-09 DIAGNOSIS — L97812 Non-pressure chronic ulcer of other part of right lower leg with fat layer exposed: Secondary | ICD-10-CM | POA: Diagnosis not present

## 2015-12-12 DIAGNOSIS — Z7901 Long term (current) use of anticoagulants: Secondary | ICD-10-CM | POA: Diagnosis not present

## 2015-12-12 DIAGNOSIS — I872 Venous insufficiency (chronic) (peripheral): Secondary | ICD-10-CM | POA: Diagnosis not present

## 2015-12-12 DIAGNOSIS — G8929 Other chronic pain: Secondary | ICD-10-CM | POA: Diagnosis not present

## 2015-12-12 DIAGNOSIS — I2510 Atherosclerotic heart disease of native coronary artery without angina pectoris: Secondary | ICD-10-CM | POA: Diagnosis not present

## 2015-12-12 DIAGNOSIS — M5442 Lumbago with sciatica, left side: Secondary | ICD-10-CM | POA: Diagnosis not present

## 2015-12-12 DIAGNOSIS — I4891 Unspecified atrial fibrillation: Secondary | ICD-10-CM | POA: Diagnosis not present

## 2015-12-12 DIAGNOSIS — M4807 Spinal stenosis, lumbosacral region: Secondary | ICD-10-CM | POA: Diagnosis not present

## 2015-12-12 DIAGNOSIS — S81811D Laceration without foreign body, right lower leg, subsequent encounter: Secondary | ICD-10-CM | POA: Diagnosis not present

## 2015-12-12 DIAGNOSIS — M5441 Lumbago with sciatica, right side: Secondary | ICD-10-CM | POA: Diagnosis not present

## 2015-12-14 DIAGNOSIS — Z7901 Long term (current) use of anticoagulants: Secondary | ICD-10-CM | POA: Diagnosis not present

## 2015-12-14 DIAGNOSIS — I872 Venous insufficiency (chronic) (peripheral): Secondary | ICD-10-CM | POA: Diagnosis not present

## 2015-12-14 DIAGNOSIS — I2510 Atherosclerotic heart disease of native coronary artery without angina pectoris: Secondary | ICD-10-CM | POA: Diagnosis not present

## 2015-12-14 DIAGNOSIS — S81811D Laceration without foreign body, right lower leg, subsequent encounter: Secondary | ICD-10-CM | POA: Diagnosis not present

## 2015-12-14 DIAGNOSIS — I4891 Unspecified atrial fibrillation: Secondary | ICD-10-CM | POA: Diagnosis not present

## 2015-12-16 ENCOUNTER — Encounter (HOSPITAL_BASED_OUTPATIENT_CLINIC_OR_DEPARTMENT_OTHER): Payer: Medicare Other | Attending: Internal Medicine

## 2015-12-16 ENCOUNTER — Ambulatory Visit (INDEPENDENT_AMBULATORY_CARE_PROVIDER_SITE_OTHER): Payer: Medicare Other | Admitting: *Deleted

## 2015-12-16 DIAGNOSIS — L97812 Non-pressure chronic ulcer of other part of right lower leg with fat layer exposed: Secondary | ICD-10-CM | POA: Insufficient documentation

## 2015-12-16 DIAGNOSIS — S81801A Unspecified open wound, right lower leg, initial encounter: Secondary | ICD-10-CM | POA: Diagnosis not present

## 2015-12-16 DIAGNOSIS — I872 Venous insufficiency (chronic) (peripheral): Secondary | ICD-10-CM | POA: Diagnosis not present

## 2015-12-16 DIAGNOSIS — D649 Anemia, unspecified: Secondary | ICD-10-CM | POA: Diagnosis not present

## 2015-12-16 DIAGNOSIS — Z5181 Encounter for therapeutic drug level monitoring: Secondary | ICD-10-CM

## 2015-12-16 DIAGNOSIS — I87331 Chronic venous hypertension (idiopathic) with ulcer and inflammation of right lower extremity: Secondary | ICD-10-CM | POA: Diagnosis not present

## 2015-12-16 DIAGNOSIS — I739 Peripheral vascular disease, unspecified: Secondary | ICD-10-CM | POA: Insufficient documentation

## 2015-12-16 DIAGNOSIS — I2510 Atherosclerotic heart disease of native coronary artery without angina pectoris: Secondary | ICD-10-CM | POA: Diagnosis not present

## 2015-12-16 DIAGNOSIS — I10 Essential (primary) hypertension: Secondary | ICD-10-CM | POA: Diagnosis not present

## 2015-12-16 DIAGNOSIS — I4891 Unspecified atrial fibrillation: Secondary | ICD-10-CM | POA: Diagnosis not present

## 2015-12-16 LAB — POCT INR: INR: 2.3

## 2015-12-19 DIAGNOSIS — Z7901 Long term (current) use of anticoagulants: Secondary | ICD-10-CM | POA: Diagnosis not present

## 2015-12-19 DIAGNOSIS — I2510 Atherosclerotic heart disease of native coronary artery without angina pectoris: Secondary | ICD-10-CM | POA: Diagnosis not present

## 2015-12-19 DIAGNOSIS — I872 Venous insufficiency (chronic) (peripheral): Secondary | ICD-10-CM | POA: Diagnosis not present

## 2015-12-19 DIAGNOSIS — I4891 Unspecified atrial fibrillation: Secondary | ICD-10-CM | POA: Diagnosis not present

## 2015-12-19 DIAGNOSIS — S81811D Laceration without foreign body, right lower leg, subsequent encounter: Secondary | ICD-10-CM | POA: Diagnosis not present

## 2015-12-21 DIAGNOSIS — S81811D Laceration without foreign body, right lower leg, subsequent encounter: Secondary | ICD-10-CM | POA: Diagnosis not present

## 2015-12-21 DIAGNOSIS — I4891 Unspecified atrial fibrillation: Secondary | ICD-10-CM | POA: Diagnosis not present

## 2015-12-21 DIAGNOSIS — I2510 Atherosclerotic heart disease of native coronary artery without angina pectoris: Secondary | ICD-10-CM | POA: Diagnosis not present

## 2015-12-21 DIAGNOSIS — Z7901 Long term (current) use of anticoagulants: Secondary | ICD-10-CM | POA: Diagnosis not present

## 2015-12-21 DIAGNOSIS — I872 Venous insufficiency (chronic) (peripheral): Secondary | ICD-10-CM | POA: Diagnosis not present

## 2015-12-23 DIAGNOSIS — L97812 Non-pressure chronic ulcer of other part of right lower leg with fat layer exposed: Secondary | ICD-10-CM | POA: Diagnosis not present

## 2015-12-23 DIAGNOSIS — I87331 Chronic venous hypertension (idiopathic) with ulcer and inflammation of right lower extremity: Secondary | ICD-10-CM | POA: Diagnosis not present

## 2015-12-23 DIAGNOSIS — I10 Essential (primary) hypertension: Secondary | ICD-10-CM | POA: Diagnosis not present

## 2015-12-23 DIAGNOSIS — I739 Peripheral vascular disease, unspecified: Secondary | ICD-10-CM | POA: Diagnosis not present

## 2015-12-23 DIAGNOSIS — I87321 Chronic venous hypertension (idiopathic) with inflammation of right lower extremity: Secondary | ICD-10-CM | POA: Diagnosis not present

## 2015-12-23 DIAGNOSIS — D649 Anemia, unspecified: Secondary | ICD-10-CM | POA: Diagnosis not present

## 2015-12-23 DIAGNOSIS — I2510 Atherosclerotic heart disease of native coronary artery without angina pectoris: Secondary | ICD-10-CM | POA: Diagnosis not present

## 2015-12-26 ENCOUNTER — Telehealth: Payer: Self-pay | Admitting: *Deleted

## 2015-12-26 DIAGNOSIS — I4891 Unspecified atrial fibrillation: Secondary | ICD-10-CM | POA: Diagnosis not present

## 2015-12-26 DIAGNOSIS — S81811D Laceration without foreign body, right lower leg, subsequent encounter: Secondary | ICD-10-CM | POA: Diagnosis not present

## 2015-12-26 DIAGNOSIS — I2510 Atherosclerotic heart disease of native coronary artery without angina pectoris: Secondary | ICD-10-CM | POA: Diagnosis not present

## 2015-12-26 DIAGNOSIS — Z7901 Long term (current) use of anticoagulants: Secondary | ICD-10-CM | POA: Diagnosis not present

## 2015-12-26 DIAGNOSIS — I872 Venous insufficiency (chronic) (peripheral): Secondary | ICD-10-CM | POA: Diagnosis not present

## 2015-12-26 NOTE — Telephone Encounter (Signed)
Pt called to report that she is taking cephalexin 250mg  three times a day, she started on 12/23/15 & will complete on 12/29/15. Advised that the medication is safe to take with Coumadin/Warfarin and to call back any other medication changes & she verbalized understanding.

## 2015-12-28 DIAGNOSIS — Z7901 Long term (current) use of anticoagulants: Secondary | ICD-10-CM | POA: Diagnosis not present

## 2015-12-28 DIAGNOSIS — I4891 Unspecified atrial fibrillation: Secondary | ICD-10-CM | POA: Diagnosis not present

## 2015-12-28 DIAGNOSIS — I872 Venous insufficiency (chronic) (peripheral): Secondary | ICD-10-CM | POA: Diagnosis not present

## 2015-12-28 DIAGNOSIS — S81811D Laceration without foreign body, right lower leg, subsequent encounter: Secondary | ICD-10-CM | POA: Diagnosis not present

## 2015-12-28 DIAGNOSIS — I2510 Atherosclerotic heart disease of native coronary artery without angina pectoris: Secondary | ICD-10-CM | POA: Diagnosis not present

## 2015-12-30 ENCOUNTER — Other Ambulatory Visit: Payer: Self-pay | Admitting: Internal Medicine

## 2015-12-30 DIAGNOSIS — I87321 Chronic venous hypertension (idiopathic) with inflammation of right lower extremity: Secondary | ICD-10-CM | POA: Diagnosis not present

## 2015-12-30 DIAGNOSIS — I739 Peripheral vascular disease, unspecified: Secondary | ICD-10-CM | POA: Diagnosis not present

## 2015-12-30 DIAGNOSIS — L97812 Non-pressure chronic ulcer of other part of right lower leg with fat layer exposed: Secondary | ICD-10-CM | POA: Diagnosis not present

## 2015-12-30 DIAGNOSIS — I87331 Chronic venous hypertension (idiopathic) with ulcer and inflammation of right lower extremity: Secondary | ICD-10-CM | POA: Diagnosis not present

## 2015-12-30 DIAGNOSIS — D649 Anemia, unspecified: Secondary | ICD-10-CM | POA: Diagnosis not present

## 2015-12-30 DIAGNOSIS — I10 Essential (primary) hypertension: Secondary | ICD-10-CM | POA: Diagnosis not present

## 2015-12-30 DIAGNOSIS — I2510 Atherosclerotic heart disease of native coronary artery without angina pectoris: Secondary | ICD-10-CM | POA: Diagnosis not present

## 2016-01-02 DIAGNOSIS — I4891 Unspecified atrial fibrillation: Secondary | ICD-10-CM | POA: Diagnosis not present

## 2016-01-02 DIAGNOSIS — Z7901 Long term (current) use of anticoagulants: Secondary | ICD-10-CM | POA: Diagnosis not present

## 2016-01-02 DIAGNOSIS — S81811D Laceration without foreign body, right lower leg, subsequent encounter: Secondary | ICD-10-CM | POA: Diagnosis not present

## 2016-01-02 DIAGNOSIS — I2510 Atherosclerotic heart disease of native coronary artery without angina pectoris: Secondary | ICD-10-CM | POA: Diagnosis not present

## 2016-01-02 DIAGNOSIS — I872 Venous insufficiency (chronic) (peripheral): Secondary | ICD-10-CM | POA: Diagnosis not present

## 2016-01-04 DIAGNOSIS — I2510 Atherosclerotic heart disease of native coronary artery without angina pectoris: Secondary | ICD-10-CM | POA: Diagnosis not present

## 2016-01-04 DIAGNOSIS — I4891 Unspecified atrial fibrillation: Secondary | ICD-10-CM | POA: Diagnosis not present

## 2016-01-04 DIAGNOSIS — Z7901 Long term (current) use of anticoagulants: Secondary | ICD-10-CM | POA: Diagnosis not present

## 2016-01-04 DIAGNOSIS — I872 Venous insufficiency (chronic) (peripheral): Secondary | ICD-10-CM | POA: Diagnosis not present

## 2016-01-04 DIAGNOSIS — S81811D Laceration without foreign body, right lower leg, subsequent encounter: Secondary | ICD-10-CM | POA: Diagnosis not present

## 2016-01-06 DIAGNOSIS — I2510 Atherosclerotic heart disease of native coronary artery without angina pectoris: Secondary | ICD-10-CM | POA: Diagnosis not present

## 2016-01-06 DIAGNOSIS — I87331 Chronic venous hypertension (idiopathic) with ulcer and inflammation of right lower extremity: Secondary | ICD-10-CM | POA: Diagnosis not present

## 2016-01-06 DIAGNOSIS — I10 Essential (primary) hypertension: Secondary | ICD-10-CM | POA: Diagnosis not present

## 2016-01-06 DIAGNOSIS — L97812 Non-pressure chronic ulcer of other part of right lower leg with fat layer exposed: Secondary | ICD-10-CM | POA: Diagnosis not present

## 2016-01-06 DIAGNOSIS — D649 Anemia, unspecified: Secondary | ICD-10-CM | POA: Diagnosis not present

## 2016-01-06 DIAGNOSIS — I739 Peripheral vascular disease, unspecified: Secondary | ICD-10-CM | POA: Diagnosis not present

## 2016-01-06 DIAGNOSIS — L97811 Non-pressure chronic ulcer of other part of right lower leg limited to breakdown of skin: Secondary | ICD-10-CM | POA: Diagnosis not present

## 2016-01-06 DIAGNOSIS — I87311 Chronic venous hypertension (idiopathic) with ulcer of right lower extremity: Secondary | ICD-10-CM | POA: Diagnosis not present

## 2016-01-09 ENCOUNTER — Other Ambulatory Visit: Payer: Self-pay | Admitting: Family

## 2016-01-09 DIAGNOSIS — Z7901 Long term (current) use of anticoagulants: Secondary | ICD-10-CM | POA: Diagnosis not present

## 2016-01-09 DIAGNOSIS — I872 Venous insufficiency (chronic) (peripheral): Secondary | ICD-10-CM | POA: Diagnosis not present

## 2016-01-09 DIAGNOSIS — I4891 Unspecified atrial fibrillation: Secondary | ICD-10-CM | POA: Diagnosis not present

## 2016-01-09 DIAGNOSIS — S81811D Laceration without foreign body, right lower leg, subsequent encounter: Secondary | ICD-10-CM | POA: Diagnosis not present

## 2016-01-09 DIAGNOSIS — I2510 Atherosclerotic heart disease of native coronary artery without angina pectoris: Secondary | ICD-10-CM | POA: Diagnosis not present

## 2016-01-11 DIAGNOSIS — S81811D Laceration without foreign body, right lower leg, subsequent encounter: Secondary | ICD-10-CM | POA: Diagnosis not present

## 2016-01-11 DIAGNOSIS — I2510 Atherosclerotic heart disease of native coronary artery without angina pectoris: Secondary | ICD-10-CM | POA: Diagnosis not present

## 2016-01-11 DIAGNOSIS — I4891 Unspecified atrial fibrillation: Secondary | ICD-10-CM | POA: Diagnosis not present

## 2016-01-11 DIAGNOSIS — I872 Venous insufficiency (chronic) (peripheral): Secondary | ICD-10-CM | POA: Diagnosis not present

## 2016-01-11 DIAGNOSIS — Z7901 Long term (current) use of anticoagulants: Secondary | ICD-10-CM | POA: Diagnosis not present

## 2016-01-13 ENCOUNTER — Ambulatory Visit (INDEPENDENT_AMBULATORY_CARE_PROVIDER_SITE_OTHER): Payer: Medicare Other | Admitting: Pharmacist

## 2016-01-13 ENCOUNTER — Encounter (HOSPITAL_BASED_OUTPATIENT_CLINIC_OR_DEPARTMENT_OTHER): Payer: Medicare Other | Attending: Internal Medicine

## 2016-01-13 DIAGNOSIS — L03115 Cellulitis of right lower limb: Secondary | ICD-10-CM | POA: Insufficient documentation

## 2016-01-13 DIAGNOSIS — S81811A Laceration without foreign body, right lower leg, initial encounter: Secondary | ICD-10-CM | POA: Insufficient documentation

## 2016-01-13 DIAGNOSIS — D649 Anemia, unspecified: Secondary | ICD-10-CM | POA: Insufficient documentation

## 2016-01-13 DIAGNOSIS — Z5181 Encounter for therapeutic drug level monitoring: Secondary | ICD-10-CM | POA: Diagnosis not present

## 2016-01-13 DIAGNOSIS — L97811 Non-pressure chronic ulcer of other part of right lower leg limited to breakdown of skin: Secondary | ICD-10-CM | POA: Insufficient documentation

## 2016-01-13 DIAGNOSIS — I739 Peripheral vascular disease, unspecified: Secondary | ICD-10-CM | POA: Diagnosis not present

## 2016-01-13 DIAGNOSIS — I2510 Atherosclerotic heart disease of native coronary artery without angina pectoris: Secondary | ICD-10-CM | POA: Diagnosis not present

## 2016-01-13 DIAGNOSIS — X58XXXA Exposure to other specified factors, initial encounter: Secondary | ICD-10-CM | POA: Insufficient documentation

## 2016-01-13 DIAGNOSIS — I4891 Unspecified atrial fibrillation: Secondary | ICD-10-CM | POA: Diagnosis not present

## 2016-01-13 DIAGNOSIS — B9561 Methicillin susceptible Staphylococcus aureus infection as the cause of diseases classified elsewhere: Secondary | ICD-10-CM | POA: Insufficient documentation

## 2016-01-13 DIAGNOSIS — I87331 Chronic venous hypertension (idiopathic) with ulcer and inflammation of right lower extremity: Secondary | ICD-10-CM | POA: Insufficient documentation

## 2016-01-13 DIAGNOSIS — I10 Essential (primary) hypertension: Secondary | ICD-10-CM | POA: Insufficient documentation

## 2016-01-13 LAB — POCT INR: INR: 2.6

## 2016-01-16 DIAGNOSIS — I2510 Atherosclerotic heart disease of native coronary artery without angina pectoris: Secondary | ICD-10-CM | POA: Diagnosis not present

## 2016-01-16 DIAGNOSIS — Z7901 Long term (current) use of anticoagulants: Secondary | ICD-10-CM | POA: Diagnosis not present

## 2016-01-16 DIAGNOSIS — I872 Venous insufficiency (chronic) (peripheral): Secondary | ICD-10-CM | POA: Diagnosis not present

## 2016-01-16 DIAGNOSIS — I4891 Unspecified atrial fibrillation: Secondary | ICD-10-CM | POA: Diagnosis not present

## 2016-01-16 DIAGNOSIS — S81811D Laceration without foreign body, right lower leg, subsequent encounter: Secondary | ICD-10-CM | POA: Diagnosis not present

## 2016-01-18 DIAGNOSIS — I872 Venous insufficiency (chronic) (peripheral): Secondary | ICD-10-CM | POA: Diagnosis not present

## 2016-01-18 DIAGNOSIS — Z7901 Long term (current) use of anticoagulants: Secondary | ICD-10-CM | POA: Diagnosis not present

## 2016-01-18 DIAGNOSIS — S81811D Laceration without foreign body, right lower leg, subsequent encounter: Secondary | ICD-10-CM | POA: Diagnosis not present

## 2016-01-18 DIAGNOSIS — I4891 Unspecified atrial fibrillation: Secondary | ICD-10-CM | POA: Diagnosis not present

## 2016-01-18 DIAGNOSIS — I2510 Atherosclerotic heart disease of native coronary artery without angina pectoris: Secondary | ICD-10-CM | POA: Diagnosis not present

## 2016-01-20 DIAGNOSIS — I87331 Chronic venous hypertension (idiopathic) with ulcer and inflammation of right lower extremity: Secondary | ICD-10-CM | POA: Diagnosis not present

## 2016-01-20 DIAGNOSIS — L97811 Non-pressure chronic ulcer of other part of right lower leg limited to breakdown of skin: Secondary | ICD-10-CM | POA: Diagnosis not present

## 2016-01-20 DIAGNOSIS — I87311 Chronic venous hypertension (idiopathic) with ulcer of right lower extremity: Secondary | ICD-10-CM | POA: Diagnosis not present

## 2016-01-23 ENCOUNTER — Telehealth: Payer: Self-pay | Admitting: Internal Medicine

## 2016-01-23 DIAGNOSIS — I87331 Chronic venous hypertension (idiopathic) with ulcer and inflammation of right lower extremity: Secondary | ICD-10-CM | POA: Diagnosis not present

## 2016-01-23 DIAGNOSIS — I87311 Chronic venous hypertension (idiopathic) with ulcer of right lower extremity: Secondary | ICD-10-CM | POA: Diagnosis not present

## 2016-01-23 DIAGNOSIS — L97811 Non-pressure chronic ulcer of other part of right lower leg limited to breakdown of skin: Secondary | ICD-10-CM | POA: Diagnosis not present

## 2016-01-23 NOTE — Telephone Encounter (Signed)
Spoke with pt's daughter.  She is taking cephalexin for staph infection on skin wound.  The patient told her daughter she noticed some blood in her stools on Saturday.  She did not mention the color specifically but did mention she was constipation on Saturday.  No problems with other bowel movements since then.    Given description, blood was likely due to irritation during bowel movement.  Cephalexin should not cause any problems with INR.  Have instructed pt to continue to watch and let us know if there are any more episodes or if she notices a more dark stool.

## 2016-01-23 NOTE — Telephone Encounter (Signed)
New message  Pt daughter called states that the pt is now on an antibiotic. Cephalexine, 1500 mg daily. ( 3 daily ) a wound that will not heal. Blood in stool within last couple of days. Please call back to discuss

## 2016-01-25 DIAGNOSIS — Z7901 Long term (current) use of anticoagulants: Secondary | ICD-10-CM | POA: Diagnosis not present

## 2016-01-25 DIAGNOSIS — I2510 Atherosclerotic heart disease of native coronary artery without angina pectoris: Secondary | ICD-10-CM | POA: Diagnosis not present

## 2016-01-25 DIAGNOSIS — S81811D Laceration without foreign body, right lower leg, subsequent encounter: Secondary | ICD-10-CM | POA: Diagnosis not present

## 2016-01-25 DIAGNOSIS — I4891 Unspecified atrial fibrillation: Secondary | ICD-10-CM | POA: Diagnosis not present

## 2016-01-25 DIAGNOSIS — I872 Venous insufficiency (chronic) (peripheral): Secondary | ICD-10-CM | POA: Diagnosis not present

## 2016-01-27 DIAGNOSIS — I87331 Chronic venous hypertension (idiopathic) with ulcer and inflammation of right lower extremity: Secondary | ICD-10-CM | POA: Diagnosis not present

## 2016-01-27 DIAGNOSIS — I87311 Chronic venous hypertension (idiopathic) with ulcer of right lower extremity: Secondary | ICD-10-CM | POA: Diagnosis not present

## 2016-01-27 DIAGNOSIS — L97811 Non-pressure chronic ulcer of other part of right lower leg limited to breakdown of skin: Secondary | ICD-10-CM | POA: Diagnosis not present

## 2016-01-30 DIAGNOSIS — I4891 Unspecified atrial fibrillation: Secondary | ICD-10-CM | POA: Diagnosis not present

## 2016-01-30 DIAGNOSIS — S81811D Laceration without foreign body, right lower leg, subsequent encounter: Secondary | ICD-10-CM | POA: Diagnosis not present

## 2016-01-30 DIAGNOSIS — Z7901 Long term (current) use of anticoagulants: Secondary | ICD-10-CM | POA: Diagnosis not present

## 2016-01-30 DIAGNOSIS — I2510 Atherosclerotic heart disease of native coronary artery without angina pectoris: Secondary | ICD-10-CM | POA: Diagnosis not present

## 2016-01-30 DIAGNOSIS — I872 Venous insufficiency (chronic) (peripheral): Secondary | ICD-10-CM | POA: Diagnosis not present

## 2016-02-01 ENCOUNTER — Encounter: Payer: Self-pay | Admitting: Internal Medicine

## 2016-02-01 DIAGNOSIS — Z950 Presence of cardiac pacemaker: Secondary | ICD-10-CM | POA: Diagnosis not present

## 2016-02-01 DIAGNOSIS — I495 Sick sinus syndrome: Secondary | ICD-10-CM

## 2016-02-02 DIAGNOSIS — I4891 Unspecified atrial fibrillation: Secondary | ICD-10-CM | POA: Diagnosis not present

## 2016-02-02 DIAGNOSIS — Z7901 Long term (current) use of anticoagulants: Secondary | ICD-10-CM | POA: Diagnosis not present

## 2016-02-02 DIAGNOSIS — I872 Venous insufficiency (chronic) (peripheral): Secondary | ICD-10-CM | POA: Diagnosis not present

## 2016-02-02 DIAGNOSIS — S81811D Laceration without foreign body, right lower leg, subsequent encounter: Secondary | ICD-10-CM | POA: Diagnosis not present

## 2016-02-02 DIAGNOSIS — I2510 Atherosclerotic heart disease of native coronary artery without angina pectoris: Secondary | ICD-10-CM | POA: Diagnosis not present

## 2016-02-03 DIAGNOSIS — I87331 Chronic venous hypertension (idiopathic) with ulcer and inflammation of right lower extremity: Secondary | ICD-10-CM | POA: Diagnosis not present

## 2016-02-04 DIAGNOSIS — S81811D Laceration without foreign body, right lower leg, subsequent encounter: Secondary | ICD-10-CM | POA: Diagnosis not present

## 2016-02-04 DIAGNOSIS — I872 Venous insufficiency (chronic) (peripheral): Secondary | ICD-10-CM | POA: Diagnosis not present

## 2016-02-04 DIAGNOSIS — I2510 Atherosclerotic heart disease of native coronary artery without angina pectoris: Secondary | ICD-10-CM | POA: Diagnosis not present

## 2016-02-04 DIAGNOSIS — Z7901 Long term (current) use of anticoagulants: Secondary | ICD-10-CM | POA: Diagnosis not present

## 2016-02-04 DIAGNOSIS — I4891 Unspecified atrial fibrillation: Secondary | ICD-10-CM | POA: Diagnosis not present

## 2016-02-06 DIAGNOSIS — Z7901 Long term (current) use of anticoagulants: Secondary | ICD-10-CM | POA: Diagnosis not present

## 2016-02-06 DIAGNOSIS — I4891 Unspecified atrial fibrillation: Secondary | ICD-10-CM | POA: Diagnosis not present

## 2016-02-06 DIAGNOSIS — S81811D Laceration without foreign body, right lower leg, subsequent encounter: Secondary | ICD-10-CM | POA: Diagnosis not present

## 2016-02-06 DIAGNOSIS — I872 Venous insufficiency (chronic) (peripheral): Secondary | ICD-10-CM | POA: Diagnosis not present

## 2016-02-06 DIAGNOSIS — I2510 Atherosclerotic heart disease of native coronary artery without angina pectoris: Secondary | ICD-10-CM | POA: Diagnosis not present

## 2016-02-07 ENCOUNTER — Encounter: Payer: Self-pay | Admitting: Internal Medicine

## 2016-02-08 DIAGNOSIS — I4891 Unspecified atrial fibrillation: Secondary | ICD-10-CM | POA: Diagnosis not present

## 2016-02-08 DIAGNOSIS — I2510 Atherosclerotic heart disease of native coronary artery without angina pectoris: Secondary | ICD-10-CM | POA: Diagnosis not present

## 2016-02-08 DIAGNOSIS — S81811D Laceration without foreign body, right lower leg, subsequent encounter: Secondary | ICD-10-CM | POA: Diagnosis not present

## 2016-02-08 DIAGNOSIS — Z7901 Long term (current) use of anticoagulants: Secondary | ICD-10-CM | POA: Diagnosis not present

## 2016-02-08 DIAGNOSIS — I872 Venous insufficiency (chronic) (peripheral): Secondary | ICD-10-CM | POA: Diagnosis not present

## 2016-02-10 ENCOUNTER — Ambulatory Visit (INDEPENDENT_AMBULATORY_CARE_PROVIDER_SITE_OTHER): Payer: Medicare Other

## 2016-02-10 DIAGNOSIS — I4891 Unspecified atrial fibrillation: Secondary | ICD-10-CM | POA: Diagnosis not present

## 2016-02-10 DIAGNOSIS — I87311 Chronic venous hypertension (idiopathic) with ulcer of right lower extremity: Secondary | ICD-10-CM | POA: Diagnosis not present

## 2016-02-10 DIAGNOSIS — Z5181 Encounter for therapeutic drug level monitoring: Secondary | ICD-10-CM | POA: Diagnosis not present

## 2016-02-10 DIAGNOSIS — L97811 Non-pressure chronic ulcer of other part of right lower leg limited to breakdown of skin: Secondary | ICD-10-CM | POA: Diagnosis not present

## 2016-02-10 DIAGNOSIS — I87331 Chronic venous hypertension (idiopathic) with ulcer and inflammation of right lower extremity: Secondary | ICD-10-CM | POA: Diagnosis not present

## 2016-02-10 LAB — POCT INR: INR: 1.9

## 2016-02-13 DIAGNOSIS — S81811D Laceration without foreign body, right lower leg, subsequent encounter: Secondary | ICD-10-CM | POA: Diagnosis not present

## 2016-02-13 DIAGNOSIS — Z7901 Long term (current) use of anticoagulants: Secondary | ICD-10-CM | POA: Diagnosis not present

## 2016-02-13 DIAGNOSIS — I2510 Atherosclerotic heart disease of native coronary artery without angina pectoris: Secondary | ICD-10-CM | POA: Diagnosis not present

## 2016-02-13 DIAGNOSIS — I4891 Unspecified atrial fibrillation: Secondary | ICD-10-CM | POA: Diagnosis not present

## 2016-02-13 DIAGNOSIS — I872 Venous insufficiency (chronic) (peripheral): Secondary | ICD-10-CM | POA: Diagnosis not present

## 2016-02-15 DIAGNOSIS — I872 Venous insufficiency (chronic) (peripheral): Secondary | ICD-10-CM | POA: Diagnosis not present

## 2016-02-15 DIAGNOSIS — I4891 Unspecified atrial fibrillation: Secondary | ICD-10-CM | POA: Diagnosis not present

## 2016-02-15 DIAGNOSIS — S81811D Laceration without foreign body, right lower leg, subsequent encounter: Secondary | ICD-10-CM | POA: Diagnosis not present

## 2016-02-15 DIAGNOSIS — Z7901 Long term (current) use of anticoagulants: Secondary | ICD-10-CM | POA: Diagnosis not present

## 2016-02-15 DIAGNOSIS — I2510 Atherosclerotic heart disease of native coronary artery without angina pectoris: Secondary | ICD-10-CM | POA: Diagnosis not present

## 2016-02-17 ENCOUNTER — Encounter (HOSPITAL_BASED_OUTPATIENT_CLINIC_OR_DEPARTMENT_OTHER): Payer: Medicare Other | Attending: Internal Medicine

## 2016-02-17 DIAGNOSIS — I2510 Atherosclerotic heart disease of native coronary artery without angina pectoris: Secondary | ICD-10-CM | POA: Insufficient documentation

## 2016-02-17 DIAGNOSIS — L97811 Non-pressure chronic ulcer of other part of right lower leg limited to breakdown of skin: Secondary | ICD-10-CM | POA: Diagnosis not present

## 2016-02-17 DIAGNOSIS — I87331 Chronic venous hypertension (idiopathic) with ulcer and inflammation of right lower extremity: Secondary | ICD-10-CM | POA: Insufficient documentation

## 2016-02-17 DIAGNOSIS — L97812 Non-pressure chronic ulcer of other part of right lower leg with fat layer exposed: Secondary | ICD-10-CM | POA: Diagnosis not present

## 2016-02-17 DIAGNOSIS — I10 Essential (primary) hypertension: Secondary | ICD-10-CM | POA: Insufficient documentation

## 2016-02-17 DIAGNOSIS — L03115 Cellulitis of right lower limb: Secondary | ICD-10-CM | POA: Diagnosis not present

## 2016-02-17 DIAGNOSIS — I87311 Chronic venous hypertension (idiopathic) with ulcer of right lower extremity: Secondary | ICD-10-CM | POA: Diagnosis not present

## 2016-02-20 DIAGNOSIS — S81811D Laceration without foreign body, right lower leg, subsequent encounter: Secondary | ICD-10-CM | POA: Diagnosis not present

## 2016-02-20 DIAGNOSIS — I4891 Unspecified atrial fibrillation: Secondary | ICD-10-CM | POA: Diagnosis not present

## 2016-02-20 DIAGNOSIS — I872 Venous insufficiency (chronic) (peripheral): Secondary | ICD-10-CM | POA: Diagnosis not present

## 2016-02-20 DIAGNOSIS — I2510 Atherosclerotic heart disease of native coronary artery without angina pectoris: Secondary | ICD-10-CM | POA: Diagnosis not present

## 2016-02-20 DIAGNOSIS — Z7901 Long term (current) use of anticoagulants: Secondary | ICD-10-CM | POA: Diagnosis not present

## 2016-02-22 DIAGNOSIS — I2510 Atherosclerotic heart disease of native coronary artery without angina pectoris: Secondary | ICD-10-CM | POA: Diagnosis not present

## 2016-02-22 DIAGNOSIS — S81811D Laceration without foreign body, right lower leg, subsequent encounter: Secondary | ICD-10-CM | POA: Diagnosis not present

## 2016-02-22 DIAGNOSIS — I872 Venous insufficiency (chronic) (peripheral): Secondary | ICD-10-CM | POA: Diagnosis not present

## 2016-02-22 DIAGNOSIS — Z7901 Long term (current) use of anticoagulants: Secondary | ICD-10-CM | POA: Diagnosis not present

## 2016-02-22 DIAGNOSIS — I4891 Unspecified atrial fibrillation: Secondary | ICD-10-CM | POA: Diagnosis not present

## 2016-02-23 ENCOUNTER — Encounter: Payer: Self-pay | Admitting: Internal Medicine

## 2016-02-23 ENCOUNTER — Ambulatory Visit (INDEPENDENT_AMBULATORY_CARE_PROVIDER_SITE_OTHER): Payer: Medicare Other | Admitting: Internal Medicine

## 2016-02-23 VITALS — BP 176/90 | HR 78 | Ht 59.0 in | Wt 89.6 lb

## 2016-02-23 DIAGNOSIS — I159 Secondary hypertension, unspecified: Secondary | ICD-10-CM | POA: Diagnosis not present

## 2016-02-23 MED ORDER — AMLODIPINE BESYLATE 5 MG PO TABS: 5.0000 mg | ORAL_TABLET | Freq: Every day | ORAL | Status: DC

## 2016-02-23 MED ORDER — CLONIDINE HCL 0.1 MG PO TABS
0.1000 mg | ORAL_TABLET | ORAL | Status: AC
  Administered 2016-02-23: 0.1 mg via ORAL

## 2016-02-23 NOTE — Progress Notes (Signed)
Patient Care Team: Golden Circle, FNP as PCP - General (Family Medicine) [name], MD (Cardiology) Suella Broad, MD (Physical Medicine and Rehabilitation) Newton Pigg, MD (Obstetrics and Gynecology)   HPI  [name] is a 82 y.o. female seen in followup for permanent atrial fibrillation. She is status post AV junction on 2 separate occasions and has a previously implanted pacemaker.   Major problems at this juncture is significant fatigue with effort. Her back is feeling somewhat better. There is a significant amount of discomfort in her thighs bilaterally. She denies chest pain.  Echo done 12/03/10 demonstrated EF 65-70%; mild LVH; restricted AV motion with mean gradient of 15 mmHg; mod MR; mod-severe LAE and mod RAE; mod TR; mod PR; PASP 60.     last month since been challenging as she has a nonhealing sore on her leg with failed grafts cellulitis etc. Etc. Etc..   She has been significantly fatigued. She thinks this correlates with the initiation of carvedilol undertaken her hospitalization. This was started for  hypertension   Past Medical History  Diagnosis Date  . CAD (coronary artery disease)   . Ostium secundum type atrial septal defect   . AV block     s/p PPM  . Venous insufficiency   . Pure hypercholesterolemia   . Diverticulosis of colon (without mention of hemorrhage)   . Overactive bladder   . DJD (degenerative joint disease)   . LBP (low back pain)   . Chronic pain syndrome   . Osteoporosis     pelvic fx 10/2013 s/p fall  . Anemia     Past Surgical History  Procedure Laterality Date  . Vesicovaginal fistula closure w/ tah  1973  . Cataract extraction    . Asd repair    . Pacemaker placement  [place]  . Inguinal hernia repair  2011    bilaterally  . Shoulder surgery      x 2  . Ganglion cyst excision    . Partial hysterectomy      Current Outpatient Prescriptions  Medication Sig Dispense Refill  . acetaminophen  (TYLENOL) 500 MG tablet Take 650 mg by mouth every 8 (eight) hours as needed. Take with tramadol as needed for pain    . Ascorbic Acid (VITAMIN C) 1000 MG tablet Take 1,000 mg by mouth daily.      . Biotin 1000 MCG tablet Take 1,000 mcg by mouth daily.      . Calcium Carbonate-Vitamin D (CALCIUM 600+D) 600-400 MG-UNIT per tablet Take 2 tablets by mouth daily.      . carvedilol (COREG) 3.125 MG tablet TAKE 1 TABLET BY MOUTH TWICE A DAY WITH MEALS 60 tablet 5  . Cholecalciferol (VITAMIN D) 1000 UNITS capsule Take 1,000 Units by mouth daily.      . ferrous sulfate 325 (65 FE) MG tablet Take 325 mg by mouth daily with breakfast.     . fluticasone (FLONASE) 50 MCG/ACT nasal spray Place 2 sprays into the nose daily as needed for allergies.     . furosemide (LASIX) 40 MG tablet TAKE 1 TABLET EVERY OTHER DAY ALTERNATING WITH 2 TABLETS EVERY OTHER DAY 135 tablet 2  . Glucosamine 500 MG CAPS Take 500 mg by mouth daily.     Marland Kitchen guaiFENesin (MUCINEX) 600 MG 12 hr tablet Take 1 tablet (600 mg total) by mouth 2 (two) times daily. 30 tablet 0  . Multiple Vitamins-Minerals (PRESERVISION AREDS) TABS  Take 1 tablet by mouth daily.    . Omega-3 350 MG CAPS Take 1 capsule by mouth every evening.     Marland Kitchen omeprazole (PRILOSEC) 20 MG capsule Take 20 mg by mouth daily as needed (acid reflux).     . potassium chloride (K-DUR) 10 MEQ tablet Take 10 mEq by mouth 2 (two) times daily. And as needed when taking furosemide.    . tolterodine (DETROL) 2 MG tablet Take 2 mg by mouth 2 (two) times daily.    . traMADol (ULTRAM) 50 MG tablet Take 1 tablet (50 mg total) by mouth every 6 (six) hours as needed for moderate pain. 30 tablet 0  . vitamin A 8000 UNIT capsule Take 8,000 Units by mouth every other day.      . vitamin E 400 UNIT capsule Take 400 Units by mouth daily.      Marland Kitchen warfarin (COUMADIN) 5 MG tablet Take by mouth as directed by the coumadin clinic     No current facility-administered medications for this visit.     Allergies  Allergen Reactions  . Amiodarone Hcl     REACTION: INTOL to Amiodarone in past  . Codeine Nausea And Vomiting  . Fentanyl     REACTION: causes nausea--dizziness  . Lamisil [Terbinafine Hcl]     Causes nausea    Review of Systems negative except from HPI and PMH  Physical Exam BP 160/98 mmHg  Pulse 78  Ht 4\' 11"  (1.499 m)  Wt 89 lb 9.6 oz (40.642 kg)  BMI 18.09 kg/m2  SpO2 97%  LMP  (LMP Unknown) Well developed and nourished in no acute distress HENT normal Neck supple with JVP-8 Clear severe kyphosis Regular rate and rhythm, no murmurs or gallops Abd-soft with active BS No Clubbing cyanosis no edema but wrapped R foot which is also malodorous Skin-warm and dry A & Oriented  Grossly normal sensory and motor function  ECG demonstrates ventricular pacing with atrial fibrillation  Assessment and  Plan  Atrial fibrillation-permanent  stable on warfarin  Complete heart block status post AV junction ablation Stable p ost pacing  Pacemaker-[employer] The patient's device was interrogated.  The information was reviewed. No changes were made in the programming.    HFpEF Euvolemic continue current meds   Hypertension  Poorly controlled   her blood pressure has been poorly controlled. We have given her clonidine in the office 0.1 mg 2. Her blood pressure decreased from the 210- >>-175 range. We will plan to just let her go home and begin her on amlodipine 5 mg. We'll have her follow-up with her PCP in the next few days for repeat blood pressure assessment.

## 2016-02-23 NOTE — Patient Instructions (Signed)
Medication Instructions:  Your physician has recommended you make the following change in your medication:  1) STOP Coreg 2) START Norvasc 5 mg tablet by mouth ONCE daily   Labwork: none  Testing/Procedures: none  Follow-Up: Your physician recommends that you schedule a follow-up appointment with PCP within the week.   Your physician wants you to follow-up in: 12 months with Dr. [name]. You will receive a reminder letter in the mail two months in advance. If you don't receive a letter, please call our office to schedule the follow-up appointment.  Remote monitoring is used to monitor your Pacemaker or ICD from home. This monitoring reduces the number of office visits required to check your device to one time per year. It allows [place] to keep an eye on the functioning of your device to ensure it is working properly. You are scheduled for a device check from home on 05/24/16. You may send your transmission at any time that day. If you have a wireless device, the transmission will be sent automatically. After your physician reviews your transmission, you will receive a postcard with your next transmission date.    Any Other Special Instructions Will Be Listed Below (If Applicable).     If you need a refill on your cardiac medications before your next appointment, please call your pharmacy.

## 2016-02-24 ENCOUNTER — Encounter: Payer: Medicare Other | Admitting: Internal Medicine

## 2016-02-24 DIAGNOSIS — I87331 Chronic venous hypertension (idiopathic) with ulcer and inflammation of right lower extremity: Secondary | ICD-10-CM | POA: Diagnosis not present

## 2016-02-24 DIAGNOSIS — L97811 Non-pressure chronic ulcer of other part of right lower leg limited to breakdown of skin: Secondary | ICD-10-CM | POA: Diagnosis not present

## 2016-02-24 DIAGNOSIS — S8011XA Contusion of right lower leg, initial encounter: Secondary | ICD-10-CM | POA: Diagnosis not present

## 2016-02-27 DIAGNOSIS — L97812 Non-pressure chronic ulcer of other part of right lower leg with fat layer exposed: Secondary | ICD-10-CM | POA: Diagnosis not present

## 2016-02-27 DIAGNOSIS — I87331 Chronic venous hypertension (idiopathic) with ulcer and inflammation of right lower extremity: Secondary | ICD-10-CM | POA: Diagnosis not present

## 2016-02-27 DIAGNOSIS — I87311 Chronic venous hypertension (idiopathic) with ulcer of right lower extremity: Secondary | ICD-10-CM | POA: Diagnosis not present

## 2016-02-29 DIAGNOSIS — I2510 Atherosclerotic heart disease of native coronary artery without angina pectoris: Secondary | ICD-10-CM | POA: Diagnosis not present

## 2016-02-29 DIAGNOSIS — I872 Venous insufficiency (chronic) (peripheral): Secondary | ICD-10-CM | POA: Diagnosis not present

## 2016-02-29 DIAGNOSIS — I4891 Unspecified atrial fibrillation: Secondary | ICD-10-CM | POA: Diagnosis not present

## 2016-02-29 DIAGNOSIS — S81811D Laceration without foreign body, right lower leg, subsequent encounter: Secondary | ICD-10-CM | POA: Diagnosis not present

## 2016-02-29 DIAGNOSIS — Z7901 Long term (current) use of anticoagulants: Secondary | ICD-10-CM | POA: Diagnosis not present

## 2016-03-02 DIAGNOSIS — L97812 Non-pressure chronic ulcer of other part of right lower leg with fat layer exposed: Secondary | ICD-10-CM | POA: Diagnosis not present

## 2016-03-02 DIAGNOSIS — I87331 Chronic venous hypertension (idiopathic) with ulcer and inflammation of right lower extremity: Secondary | ICD-10-CM | POA: Diagnosis not present

## 2016-03-02 DIAGNOSIS — I87311 Chronic venous hypertension (idiopathic) with ulcer of right lower extremity: Secondary | ICD-10-CM | POA: Diagnosis not present

## 2016-03-05 DIAGNOSIS — S81811D Laceration without foreign body, right lower leg, subsequent encounter: Secondary | ICD-10-CM | POA: Diagnosis not present

## 2016-03-05 DIAGNOSIS — I4891 Unspecified atrial fibrillation: Secondary | ICD-10-CM | POA: Diagnosis not present

## 2016-03-05 DIAGNOSIS — Z7901 Long term (current) use of anticoagulants: Secondary | ICD-10-CM | POA: Diagnosis not present

## 2016-03-05 DIAGNOSIS — I872 Venous insufficiency (chronic) (peripheral): Secondary | ICD-10-CM | POA: Diagnosis not present

## 2016-03-05 DIAGNOSIS — I2510 Atherosclerotic heart disease of native coronary artery without angina pectoris: Secondary | ICD-10-CM | POA: Diagnosis not present

## 2016-03-07 DIAGNOSIS — I2510 Atherosclerotic heart disease of native coronary artery without angina pectoris: Secondary | ICD-10-CM | POA: Diagnosis not present

## 2016-03-07 DIAGNOSIS — I872 Venous insufficiency (chronic) (peripheral): Secondary | ICD-10-CM | POA: Diagnosis not present

## 2016-03-07 DIAGNOSIS — S81811D Laceration without foreign body, right lower leg, subsequent encounter: Secondary | ICD-10-CM | POA: Diagnosis not present

## 2016-03-07 DIAGNOSIS — Z7901 Long term (current) use of anticoagulants: Secondary | ICD-10-CM | POA: Diagnosis not present

## 2016-03-07 DIAGNOSIS — I4891 Unspecified atrial fibrillation: Secondary | ICD-10-CM | POA: Diagnosis not present

## 2016-03-08 ENCOUNTER — Ambulatory Visit (INDEPENDENT_AMBULATORY_CARE_PROVIDER_SITE_OTHER): Payer: Medicare Other | Admitting: Pharmacist

## 2016-03-08 DIAGNOSIS — I4891 Unspecified atrial fibrillation: Secondary | ICD-10-CM | POA: Diagnosis not present

## 2016-03-08 DIAGNOSIS — Z5181 Encounter for therapeutic drug level monitoring: Secondary | ICD-10-CM | POA: Diagnosis not present

## 2016-03-08 LAB — POCT INR: INR: 4

## 2016-03-09 DIAGNOSIS — I87311 Chronic venous hypertension (idiopathic) with ulcer of right lower extremity: Secondary | ICD-10-CM | POA: Diagnosis not present

## 2016-03-09 DIAGNOSIS — I87331 Chronic venous hypertension (idiopathic) with ulcer and inflammation of right lower extremity: Secondary | ICD-10-CM | POA: Diagnosis not present

## 2016-03-09 DIAGNOSIS — L97812 Non-pressure chronic ulcer of other part of right lower leg with fat layer exposed: Secondary | ICD-10-CM | POA: Diagnosis not present

## 2016-03-12 DIAGNOSIS — I4891 Unspecified atrial fibrillation: Secondary | ICD-10-CM | POA: Diagnosis not present

## 2016-03-12 DIAGNOSIS — I2510 Atherosclerotic heart disease of native coronary artery without angina pectoris: Secondary | ICD-10-CM | POA: Diagnosis not present

## 2016-03-12 DIAGNOSIS — Z7901 Long term (current) use of anticoagulants: Secondary | ICD-10-CM | POA: Diagnosis not present

## 2016-03-12 DIAGNOSIS — I872 Venous insufficiency (chronic) (peripheral): Secondary | ICD-10-CM | POA: Diagnosis not present

## 2016-03-12 DIAGNOSIS — S81811D Laceration without foreign body, right lower leg, subsequent encounter: Secondary | ICD-10-CM | POA: Diagnosis not present

## 2016-03-14 DIAGNOSIS — Z7901 Long term (current) use of anticoagulants: Secondary | ICD-10-CM | POA: Diagnosis not present

## 2016-03-14 DIAGNOSIS — S81811D Laceration without foreign body, right lower leg, subsequent encounter: Secondary | ICD-10-CM | POA: Diagnosis not present

## 2016-03-14 DIAGNOSIS — I872 Venous insufficiency (chronic) (peripheral): Secondary | ICD-10-CM | POA: Diagnosis not present

## 2016-03-14 DIAGNOSIS — I4891 Unspecified atrial fibrillation: Secondary | ICD-10-CM | POA: Diagnosis not present

## 2016-03-14 DIAGNOSIS — I2510 Atherosclerotic heart disease of native coronary artery without angina pectoris: Secondary | ICD-10-CM | POA: Diagnosis not present

## 2016-03-16 ENCOUNTER — Encounter (HOSPITAL_BASED_OUTPATIENT_CLINIC_OR_DEPARTMENT_OTHER): Payer: Medicare Other | Attending: Internal Medicine

## 2016-03-16 DIAGNOSIS — I739 Peripheral vascular disease, unspecified: Secondary | ICD-10-CM | POA: Insufficient documentation

## 2016-03-16 DIAGNOSIS — I2510 Atherosclerotic heart disease of native coronary artery without angina pectoris: Secondary | ICD-10-CM | POA: Diagnosis not present

## 2016-03-16 DIAGNOSIS — Z7901 Long term (current) use of anticoagulants: Secondary | ICD-10-CM | POA: Diagnosis not present

## 2016-03-16 DIAGNOSIS — L97811 Non-pressure chronic ulcer of other part of right lower leg limited to breakdown of skin: Secondary | ICD-10-CM | POA: Insufficient documentation

## 2016-03-16 DIAGNOSIS — I10 Essential (primary) hypertension: Secondary | ICD-10-CM | POA: Insufficient documentation

## 2016-03-16 DIAGNOSIS — S81811D Laceration without foreign body, right lower leg, subsequent encounter: Secondary | ICD-10-CM | POA: Diagnosis not present

## 2016-03-16 DIAGNOSIS — I4891 Unspecified atrial fibrillation: Secondary | ICD-10-CM | POA: Diagnosis not present

## 2016-03-16 DIAGNOSIS — I872 Venous insufficiency (chronic) (peripheral): Secondary | ICD-10-CM | POA: Diagnosis not present

## 2016-03-16 DIAGNOSIS — L03115 Cellulitis of right lower limb: Secondary | ICD-10-CM | POA: Insufficient documentation

## 2016-03-16 DIAGNOSIS — I87331 Chronic venous hypertension (idiopathic) with ulcer and inflammation of right lower extremity: Secondary | ICD-10-CM | POA: Insufficient documentation

## 2016-03-16 DIAGNOSIS — Z79899 Other long term (current) drug therapy: Secondary | ICD-10-CM | POA: Insufficient documentation

## 2016-03-19 DIAGNOSIS — Z7901 Long term (current) use of anticoagulants: Secondary | ICD-10-CM | POA: Diagnosis not present

## 2016-03-19 DIAGNOSIS — I2510 Atherosclerotic heart disease of native coronary artery without angina pectoris: Secondary | ICD-10-CM | POA: Diagnosis not present

## 2016-03-19 DIAGNOSIS — S81811D Laceration without foreign body, right lower leg, subsequent encounter: Secondary | ICD-10-CM | POA: Diagnosis not present

## 2016-03-19 DIAGNOSIS — I4891 Unspecified atrial fibrillation: Secondary | ICD-10-CM | POA: Diagnosis not present

## 2016-03-19 DIAGNOSIS — I872 Venous insufficiency (chronic) (peripheral): Secondary | ICD-10-CM | POA: Diagnosis not present

## 2016-03-21 DIAGNOSIS — I4891 Unspecified atrial fibrillation: Secondary | ICD-10-CM | POA: Diagnosis not present

## 2016-03-21 DIAGNOSIS — I2510 Atherosclerotic heart disease of native coronary artery without angina pectoris: Secondary | ICD-10-CM | POA: Diagnosis not present

## 2016-03-21 DIAGNOSIS — S81811D Laceration without foreign body, right lower leg, subsequent encounter: Secondary | ICD-10-CM | POA: Diagnosis not present

## 2016-03-21 DIAGNOSIS — I872 Venous insufficiency (chronic) (peripheral): Secondary | ICD-10-CM | POA: Diagnosis not present

## 2016-03-21 DIAGNOSIS — Z7901 Long term (current) use of anticoagulants: Secondary | ICD-10-CM | POA: Diagnosis not present

## 2016-03-22 DIAGNOSIS — I2510 Atherosclerotic heart disease of native coronary artery without angina pectoris: Secondary | ICD-10-CM | POA: Diagnosis not present

## 2016-03-22 DIAGNOSIS — Z79899 Other long term (current) drug therapy: Secondary | ICD-10-CM | POA: Diagnosis not present

## 2016-03-22 DIAGNOSIS — I739 Peripheral vascular disease, unspecified: Secondary | ICD-10-CM | POA: Diagnosis not present

## 2016-03-22 DIAGNOSIS — I87311 Chronic venous hypertension (idiopathic) with ulcer of right lower extremity: Secondary | ICD-10-CM | POA: Diagnosis not present

## 2016-03-22 DIAGNOSIS — L97811 Non-pressure chronic ulcer of other part of right lower leg limited to breakdown of skin: Secondary | ICD-10-CM | POA: Diagnosis not present

## 2016-03-22 DIAGNOSIS — L03115 Cellulitis of right lower limb: Secondary | ICD-10-CM | POA: Diagnosis not present

## 2016-03-22 DIAGNOSIS — I10 Essential (primary) hypertension: Secondary | ICD-10-CM | POA: Diagnosis not present

## 2016-03-22 DIAGNOSIS — I87331 Chronic venous hypertension (idiopathic) with ulcer and inflammation of right lower extremity: Secondary | ICD-10-CM | POA: Diagnosis not present

## 2016-03-23 ENCOUNTER — Ambulatory Visit (INDEPENDENT_AMBULATORY_CARE_PROVIDER_SITE_OTHER): Payer: Medicare Other

## 2016-03-23 DIAGNOSIS — I4891 Unspecified atrial fibrillation: Secondary | ICD-10-CM

## 2016-03-23 DIAGNOSIS — Z5181 Encounter for therapeutic drug level monitoring: Secondary | ICD-10-CM

## 2016-03-23 LAB — POCT INR: INR: 2.4

## 2016-03-26 DIAGNOSIS — Z7901 Long term (current) use of anticoagulants: Secondary | ICD-10-CM | POA: Diagnosis not present

## 2016-03-26 DIAGNOSIS — I4891 Unspecified atrial fibrillation: Secondary | ICD-10-CM | POA: Diagnosis not present

## 2016-03-26 DIAGNOSIS — I872 Venous insufficiency (chronic) (peripheral): Secondary | ICD-10-CM | POA: Diagnosis not present

## 2016-03-26 DIAGNOSIS — S81811D Laceration without foreign body, right lower leg, subsequent encounter: Secondary | ICD-10-CM | POA: Diagnosis not present

## 2016-03-26 DIAGNOSIS — I2510 Atherosclerotic heart disease of native coronary artery without angina pectoris: Secondary | ICD-10-CM | POA: Diagnosis not present

## 2016-03-28 ENCOUNTER — Other Ambulatory Visit: Payer: Self-pay | Admitting: Internal Medicine

## 2016-03-28 DIAGNOSIS — I872 Venous insufficiency (chronic) (peripheral): Secondary | ICD-10-CM | POA: Diagnosis not present

## 2016-03-28 DIAGNOSIS — I2510 Atherosclerotic heart disease of native coronary artery without angina pectoris: Secondary | ICD-10-CM | POA: Diagnosis not present

## 2016-03-28 DIAGNOSIS — I4891 Unspecified atrial fibrillation: Secondary | ICD-10-CM | POA: Diagnosis not present

## 2016-03-28 DIAGNOSIS — Z7901 Long term (current) use of anticoagulants: Secondary | ICD-10-CM | POA: Diagnosis not present

## 2016-03-28 DIAGNOSIS — S81811D Laceration without foreign body, right lower leg, subsequent encounter: Secondary | ICD-10-CM | POA: Diagnosis not present

## 2016-03-30 DIAGNOSIS — S81811D Laceration without foreign body, right lower leg, subsequent encounter: Secondary | ICD-10-CM | POA: Diagnosis not present

## 2016-03-30 DIAGNOSIS — I2510 Atherosclerotic heart disease of native coronary artery without angina pectoris: Secondary | ICD-10-CM | POA: Diagnosis not present

## 2016-03-30 DIAGNOSIS — I872 Venous insufficiency (chronic) (peripheral): Secondary | ICD-10-CM | POA: Diagnosis not present

## 2016-03-30 DIAGNOSIS — I4891 Unspecified atrial fibrillation: Secondary | ICD-10-CM | POA: Diagnosis not present

## 2016-03-30 DIAGNOSIS — Z7901 Long term (current) use of anticoagulants: Secondary | ICD-10-CM | POA: Diagnosis not present

## 2016-04-02 DIAGNOSIS — Z7901 Long term (current) use of anticoagulants: Secondary | ICD-10-CM | POA: Diagnosis not present

## 2016-04-02 DIAGNOSIS — I2510 Atherosclerotic heart disease of native coronary artery without angina pectoris: Secondary | ICD-10-CM | POA: Diagnosis not present

## 2016-04-02 DIAGNOSIS — I4891 Unspecified atrial fibrillation: Secondary | ICD-10-CM | POA: Diagnosis not present

## 2016-04-02 DIAGNOSIS — S81811D Laceration without foreign body, right lower leg, subsequent encounter: Secondary | ICD-10-CM | POA: Diagnosis not present

## 2016-04-02 DIAGNOSIS — I872 Venous insufficiency (chronic) (peripheral): Secondary | ICD-10-CM | POA: Diagnosis not present

## 2016-04-04 DIAGNOSIS — Z7901 Long term (current) use of anticoagulants: Secondary | ICD-10-CM | POA: Diagnosis not present

## 2016-04-04 DIAGNOSIS — I4891 Unspecified atrial fibrillation: Secondary | ICD-10-CM | POA: Diagnosis not present

## 2016-04-04 DIAGNOSIS — I872 Venous insufficiency (chronic) (peripheral): Secondary | ICD-10-CM | POA: Diagnosis not present

## 2016-04-04 DIAGNOSIS — Z48817 Encounter for surgical aftercare following surgery on the skin and subcutaneous tissue: Secondary | ICD-10-CM | POA: Diagnosis not present

## 2016-04-04 DIAGNOSIS — I2510 Atherosclerotic heart disease of native coronary artery without angina pectoris: Secondary | ICD-10-CM | POA: Diagnosis not present

## 2016-04-06 DIAGNOSIS — L97811 Non-pressure chronic ulcer of other part of right lower leg limited to breakdown of skin: Secondary | ICD-10-CM | POA: Diagnosis not present

## 2016-04-06 DIAGNOSIS — I10 Essential (primary) hypertension: Secondary | ICD-10-CM | POA: Diagnosis not present

## 2016-04-06 DIAGNOSIS — L03115 Cellulitis of right lower limb: Secondary | ICD-10-CM | POA: Diagnosis not present

## 2016-04-06 DIAGNOSIS — I2510 Atherosclerotic heart disease of native coronary artery without angina pectoris: Secondary | ICD-10-CM | POA: Diagnosis not present

## 2016-04-06 DIAGNOSIS — I739 Peripheral vascular disease, unspecified: Secondary | ICD-10-CM | POA: Diagnosis not present

## 2016-04-06 DIAGNOSIS — I87331 Chronic venous hypertension (idiopathic) with ulcer and inflammation of right lower extremity: Secondary | ICD-10-CM | POA: Diagnosis not present

## 2016-04-06 DIAGNOSIS — I87311 Chronic venous hypertension (idiopathic) with ulcer of right lower extremity: Secondary | ICD-10-CM | POA: Diagnosis not present

## 2016-04-09 DIAGNOSIS — I2510 Atherosclerotic heart disease of native coronary artery without angina pectoris: Secondary | ICD-10-CM | POA: Diagnosis not present

## 2016-04-09 DIAGNOSIS — I872 Venous insufficiency (chronic) (peripheral): Secondary | ICD-10-CM | POA: Diagnosis not present

## 2016-04-09 DIAGNOSIS — Z7901 Long term (current) use of anticoagulants: Secondary | ICD-10-CM | POA: Diagnosis not present

## 2016-04-09 DIAGNOSIS — I4891 Unspecified atrial fibrillation: Secondary | ICD-10-CM | POA: Diagnosis not present

## 2016-04-09 DIAGNOSIS — Z48817 Encounter for surgical aftercare following surgery on the skin and subcutaneous tissue: Secondary | ICD-10-CM | POA: Diagnosis not present

## 2016-04-11 DIAGNOSIS — I2510 Atherosclerotic heart disease of native coronary artery without angina pectoris: Secondary | ICD-10-CM | POA: Diagnosis not present

## 2016-04-11 DIAGNOSIS — I4891 Unspecified atrial fibrillation: Secondary | ICD-10-CM | POA: Diagnosis not present

## 2016-04-11 DIAGNOSIS — I872 Venous insufficiency (chronic) (peripheral): Secondary | ICD-10-CM | POA: Diagnosis not present

## 2016-04-11 DIAGNOSIS — Z48817 Encounter for surgical aftercare following surgery on the skin and subcutaneous tissue: Secondary | ICD-10-CM | POA: Diagnosis not present

## 2016-04-11 DIAGNOSIS — Z7901 Long term (current) use of anticoagulants: Secondary | ICD-10-CM | POA: Diagnosis not present

## 2016-04-12 ENCOUNTER — Encounter (HOSPITAL_BASED_OUTPATIENT_CLINIC_OR_DEPARTMENT_OTHER): Payer: Medicare Other | Attending: Internal Medicine

## 2016-04-12 DIAGNOSIS — Z79899 Other long term (current) drug therapy: Secondary | ICD-10-CM | POA: Diagnosis not present

## 2016-04-12 DIAGNOSIS — I739 Peripheral vascular disease, unspecified: Secondary | ICD-10-CM | POA: Diagnosis not present

## 2016-04-12 DIAGNOSIS — I110 Hypertensive heart disease with heart failure: Secondary | ICD-10-CM | POA: Insufficient documentation

## 2016-04-12 DIAGNOSIS — L03115 Cellulitis of right lower limb: Secondary | ICD-10-CM | POA: Diagnosis not present

## 2016-04-12 DIAGNOSIS — I87311 Chronic venous hypertension (idiopathic) with ulcer of right lower extremity: Secondary | ICD-10-CM | POA: Diagnosis not present

## 2016-04-12 DIAGNOSIS — I87331 Chronic venous hypertension (idiopathic) with ulcer and inflammation of right lower extremity: Secondary | ICD-10-CM | POA: Insufficient documentation

## 2016-04-12 DIAGNOSIS — L97811 Non-pressure chronic ulcer of other part of right lower leg limited to breakdown of skin: Secondary | ICD-10-CM | POA: Insufficient documentation

## 2016-04-12 DIAGNOSIS — Z7901 Long term (current) use of anticoagulants: Secondary | ICD-10-CM | POA: Insufficient documentation

## 2016-04-12 DIAGNOSIS — I509 Heart failure, unspecified: Secondary | ICD-10-CM | POA: Insufficient documentation

## 2016-04-12 DIAGNOSIS — I2510 Atherosclerotic heart disease of native coronary artery without angina pectoris: Secondary | ICD-10-CM | POA: Diagnosis not present

## 2016-04-13 ENCOUNTER — Ambulatory Visit (INDEPENDENT_AMBULATORY_CARE_PROVIDER_SITE_OTHER): Payer: Medicare Other | Admitting: *Deleted

## 2016-04-13 DIAGNOSIS — I4891 Unspecified atrial fibrillation: Secondary | ICD-10-CM

## 2016-04-13 DIAGNOSIS — Z5181 Encounter for therapeutic drug level monitoring: Secondary | ICD-10-CM

## 2016-04-13 LAB — POCT INR: INR: 2.2

## 2016-04-16 DIAGNOSIS — Z48817 Encounter for surgical aftercare following surgery on the skin and subcutaneous tissue: Secondary | ICD-10-CM | POA: Diagnosis not present

## 2016-04-16 DIAGNOSIS — I872 Venous insufficiency (chronic) (peripheral): Secondary | ICD-10-CM | POA: Diagnosis not present

## 2016-04-16 DIAGNOSIS — Z7901 Long term (current) use of anticoagulants: Secondary | ICD-10-CM | POA: Diagnosis not present

## 2016-04-16 DIAGNOSIS — I4891 Unspecified atrial fibrillation: Secondary | ICD-10-CM | POA: Diagnosis not present

## 2016-04-16 DIAGNOSIS — I2510 Atherosclerotic heart disease of native coronary artery without angina pectoris: Secondary | ICD-10-CM | POA: Diagnosis not present

## 2016-04-18 DIAGNOSIS — Z48817 Encounter for surgical aftercare following surgery on the skin and subcutaneous tissue: Secondary | ICD-10-CM | POA: Diagnosis not present

## 2016-04-18 DIAGNOSIS — I2510 Atherosclerotic heart disease of native coronary artery without angina pectoris: Secondary | ICD-10-CM | POA: Diagnosis not present

## 2016-04-18 DIAGNOSIS — Z7901 Long term (current) use of anticoagulants: Secondary | ICD-10-CM | POA: Diagnosis not present

## 2016-04-18 DIAGNOSIS — I872 Venous insufficiency (chronic) (peripheral): Secondary | ICD-10-CM | POA: Diagnosis not present

## 2016-04-18 DIAGNOSIS — I4891 Unspecified atrial fibrillation: Secondary | ICD-10-CM | POA: Diagnosis not present

## 2016-04-20 DIAGNOSIS — I2510 Atherosclerotic heart disease of native coronary artery without angina pectoris: Secondary | ICD-10-CM | POA: Diagnosis not present

## 2016-04-20 DIAGNOSIS — L97811 Non-pressure chronic ulcer of other part of right lower leg limited to breakdown of skin: Secondary | ICD-10-CM | POA: Diagnosis not present

## 2016-04-20 DIAGNOSIS — I110 Hypertensive heart disease with heart failure: Secondary | ICD-10-CM | POA: Diagnosis not present

## 2016-04-20 DIAGNOSIS — I87331 Chronic venous hypertension (idiopathic) with ulcer and inflammation of right lower extremity: Secondary | ICD-10-CM | POA: Diagnosis not present

## 2016-04-20 DIAGNOSIS — I509 Heart failure, unspecified: Secondary | ICD-10-CM | POA: Diagnosis not present

## 2016-04-20 DIAGNOSIS — L03115 Cellulitis of right lower limb: Secondary | ICD-10-CM | POA: Diagnosis not present

## 2016-04-23 DIAGNOSIS — I872 Venous insufficiency (chronic) (peripheral): Secondary | ICD-10-CM | POA: Diagnosis not present

## 2016-04-23 DIAGNOSIS — I4891 Unspecified atrial fibrillation: Secondary | ICD-10-CM | POA: Diagnosis not present

## 2016-04-23 DIAGNOSIS — Z48817 Encounter for surgical aftercare following surgery on the skin and subcutaneous tissue: Secondary | ICD-10-CM | POA: Diagnosis not present

## 2016-04-23 DIAGNOSIS — I2510 Atherosclerotic heart disease of native coronary artery without angina pectoris: Secondary | ICD-10-CM | POA: Diagnosis not present

## 2016-04-23 DIAGNOSIS — Z7901 Long term (current) use of anticoagulants: Secondary | ICD-10-CM | POA: Diagnosis not present

## 2016-04-25 ENCOUNTER — Telehealth: Payer: Self-pay

## 2016-04-25 DIAGNOSIS — Z7901 Long term (current) use of anticoagulants: Secondary | ICD-10-CM | POA: Diagnosis not present

## 2016-04-25 DIAGNOSIS — Z48817 Encounter for surgical aftercare following surgery on the skin and subcutaneous tissue: Secondary | ICD-10-CM | POA: Diagnosis not present

## 2016-04-25 DIAGNOSIS — I872 Venous insufficiency (chronic) (peripheral): Secondary | ICD-10-CM | POA: Diagnosis not present

## 2016-04-25 DIAGNOSIS — I4891 Unspecified atrial fibrillation: Secondary | ICD-10-CM | POA: Diagnosis not present

## 2016-04-25 DIAGNOSIS — I2510 Atherosclerotic heart disease of native coronary artery without angina pectoris: Secondary | ICD-10-CM | POA: Diagnosis not present

## 2016-04-25 NOTE — Telephone Encounter (Signed)
That would be fine with me.  

## 2016-04-25 NOTE — Telephone Encounter (Signed)
Patient used to get prolia injections with dr leschber--routing to greg, are you ok if i reach out to patient to see if she would like to start prolia injections again---please advise, i will call patient, thanks

## 2016-04-25 NOTE — Telephone Encounter (Signed)
Jonelle Sidle to call patient to ask about prolia

## 2016-04-27 DIAGNOSIS — L03115 Cellulitis of right lower limb: Secondary | ICD-10-CM | POA: Diagnosis not present

## 2016-04-27 DIAGNOSIS — L97811 Non-pressure chronic ulcer of other part of right lower leg limited to breakdown of skin: Secondary | ICD-10-CM | POA: Diagnosis not present

## 2016-04-27 DIAGNOSIS — I509 Heart failure, unspecified: Secondary | ICD-10-CM | POA: Diagnosis not present

## 2016-04-27 DIAGNOSIS — I2510 Atherosclerotic heart disease of native coronary artery without angina pectoris: Secondary | ICD-10-CM | POA: Diagnosis not present

## 2016-04-27 DIAGNOSIS — S8011XA Contusion of right lower leg, initial encounter: Secondary | ICD-10-CM | POA: Diagnosis not present

## 2016-04-27 DIAGNOSIS — I87331 Chronic venous hypertension (idiopathic) with ulcer and inflammation of right lower extremity: Secondary | ICD-10-CM | POA: Diagnosis not present

## 2016-04-27 DIAGNOSIS — I110 Hypertensive heart disease with heart failure: Secondary | ICD-10-CM | POA: Diagnosis not present

## 2016-04-30 DIAGNOSIS — I4891 Unspecified atrial fibrillation: Secondary | ICD-10-CM | POA: Diagnosis not present

## 2016-04-30 DIAGNOSIS — Z48817 Encounter for surgical aftercare following surgery on the skin and subcutaneous tissue: Secondary | ICD-10-CM | POA: Diagnosis not present

## 2016-04-30 DIAGNOSIS — I2510 Atherosclerotic heart disease of native coronary artery without angina pectoris: Secondary | ICD-10-CM | POA: Diagnosis not present

## 2016-04-30 DIAGNOSIS — Z7901 Long term (current) use of anticoagulants: Secondary | ICD-10-CM | POA: Diagnosis not present

## 2016-04-30 DIAGNOSIS — I872 Venous insufficiency (chronic) (peripheral): Secondary | ICD-10-CM | POA: Diagnosis not present

## 2016-05-02 DIAGNOSIS — Z48817 Encounter for surgical aftercare following surgery on the skin and subcutaneous tissue: Secondary | ICD-10-CM | POA: Diagnosis not present

## 2016-05-02 DIAGNOSIS — Z950 Presence of cardiac pacemaker: Secondary | ICD-10-CM | POA: Diagnosis not present

## 2016-05-02 DIAGNOSIS — Z7901 Long term (current) use of anticoagulants: Secondary | ICD-10-CM | POA: Diagnosis not present

## 2016-05-02 DIAGNOSIS — I495 Sick sinus syndrome: Secondary | ICD-10-CM | POA: Diagnosis not present

## 2016-05-02 DIAGNOSIS — I4891 Unspecified atrial fibrillation: Secondary | ICD-10-CM | POA: Diagnosis not present

## 2016-05-02 DIAGNOSIS — I872 Venous insufficiency (chronic) (peripheral): Secondary | ICD-10-CM | POA: Diagnosis not present

## 2016-05-02 DIAGNOSIS — I2510 Atherosclerotic heart disease of native coronary artery without angina pectoris: Secondary | ICD-10-CM | POA: Diagnosis not present

## 2016-05-04 DIAGNOSIS — I110 Hypertensive heart disease with heart failure: Secondary | ICD-10-CM | POA: Diagnosis not present

## 2016-05-04 DIAGNOSIS — L03115 Cellulitis of right lower limb: Secondary | ICD-10-CM | POA: Diagnosis not present

## 2016-05-04 DIAGNOSIS — I509 Heart failure, unspecified: Secondary | ICD-10-CM | POA: Diagnosis not present

## 2016-05-04 DIAGNOSIS — L97811 Non-pressure chronic ulcer of other part of right lower leg limited to breakdown of skin: Secondary | ICD-10-CM | POA: Diagnosis not present

## 2016-05-04 DIAGNOSIS — I2510 Atherosclerotic heart disease of native coronary artery without angina pectoris: Secondary | ICD-10-CM | POA: Diagnosis not present

## 2016-05-04 DIAGNOSIS — I87331 Chronic venous hypertension (idiopathic) with ulcer and inflammation of right lower extremity: Secondary | ICD-10-CM | POA: Diagnosis not present

## 2016-05-04 DIAGNOSIS — S81801A Unspecified open wound, right lower leg, initial encounter: Secondary | ICD-10-CM | POA: Diagnosis not present

## 2016-05-07 ENCOUNTER — Telehealth: Payer: Self-pay | Admitting: Internal Medicine

## 2016-05-07 DIAGNOSIS — Z48817 Encounter for surgical aftercare following surgery on the skin and subcutaneous tissue: Secondary | ICD-10-CM | POA: Diagnosis not present

## 2016-05-07 DIAGNOSIS — I2510 Atherosclerotic heart disease of native coronary artery without angina pectoris: Secondary | ICD-10-CM | POA: Diagnosis not present

## 2016-05-07 DIAGNOSIS — Z7901 Long term (current) use of anticoagulants: Secondary | ICD-10-CM | POA: Diagnosis not present

## 2016-05-07 DIAGNOSIS — I4891 Unspecified atrial fibrillation: Secondary | ICD-10-CM | POA: Diagnosis not present

## 2016-05-07 DIAGNOSIS — I872 Venous insufficiency (chronic) (peripheral): Secondary | ICD-10-CM | POA: Diagnosis not present

## 2016-05-07 NOTE — Telephone Encounter (Signed)
New message   Home health did a home visit today.  Pt c/o swelling: STAT is pt has developed SOB within 24 hours  1. How long have you been experiencing swelling? Notice on yesterday - daughter states nurse to look at nurse.   2. Where is the swelling located? Left  Legs / discolor / + 3 edema  3.  Are you currently taking a "fluid pill"? increase lasix by patient this am    4.  Are you currently SOB? By nurse - patient verbalized no   5.  Have you traveled recently? o

## 2016-05-07 NOTE — Telephone Encounter (Signed)
[place], [facility] nurse, was in today to see pt. Tiffany states pt has 3+ pitting edema to her left leg. States pt's wt is usually 82-83lbs and this morning it was 89 lbs. Tiffany states pt denies any pain, SOB and is totally asymptomatic.   Family member has given pt an extra Lasix today. Home Health nurse states that she did go ahead and wrap pt's leg with compression wrapping and will be back to see pt on Wednesday to reevaluate.   Tiffany states pt does not appear to have cellulitis and no signs of a blood clot in that leg. Advised Tiffany that I will send this message to Dr. [name] and his nurse for review and advisement.

## 2016-05-08 NOTE — Telephone Encounter (Signed)
Our recorded weight in 3/17 was 89 obs, but home scales might be different  We can have her take furosemide 80 daily x 4 days and then return to normal dosing.    Let us know if no improvement

## 2016-05-08 NOTE — Telephone Encounter (Signed)
Spoke with [name], home health nurse, and advised her of new orders provided by Dr. [name]. [name] verbalized understanding and was in agreement with this plan. [name] still plans to call back tomorrow when she sees pt to let us know how her edema is since she has had the compression wraps on.

## 2016-05-08 NOTE — Telephone Encounter (Signed)
Left message for Tiffany at [facility] to call back. Attempted to call pt or daughter to inform them of new orders.  Phone rang several times with no answer and started with busy signal. Will try again later.

## 2016-05-09 DIAGNOSIS — Z7901 Long term (current) use of anticoagulants: Secondary | ICD-10-CM | POA: Diagnosis not present

## 2016-05-09 DIAGNOSIS — Z48817 Encounter for surgical aftercare following surgery on the skin and subcutaneous tissue: Secondary | ICD-10-CM | POA: Diagnosis not present

## 2016-05-09 DIAGNOSIS — I2510 Atherosclerotic heart disease of native coronary artery without angina pectoris: Secondary | ICD-10-CM | POA: Diagnosis not present

## 2016-05-09 DIAGNOSIS — I872 Venous insufficiency (chronic) (peripheral): Secondary | ICD-10-CM | POA: Diagnosis not present

## 2016-05-09 DIAGNOSIS — I4891 Unspecified atrial fibrillation: Secondary | ICD-10-CM | POA: Diagnosis not present

## 2016-05-09 NOTE — Telephone Encounter (Signed)
Follow-up      The home health nurse was calling to give the nurse  A up date on the patient.  No other information provided

## 2016-05-09 NOTE — Telephone Encounter (Addendum)
PER  TIFFANY FROM HOME HEALTH  [facility]  HAS  IMPROVED  COMPRESSION WRAP REDONE WILL INFORM DR [name]  .Adonis Housekeeper

## 2016-05-10 DIAGNOSIS — M25512 Pain in left shoulder: Secondary | ICD-10-CM | POA: Diagnosis not present

## 2016-05-10 DIAGNOSIS — M25511 Pain in right shoulder: Secondary | ICD-10-CM | POA: Diagnosis not present

## 2016-05-11 ENCOUNTER — Ambulatory Visit (INDEPENDENT_AMBULATORY_CARE_PROVIDER_SITE_OTHER): Payer: Medicare Other

## 2016-05-11 DIAGNOSIS — L03115 Cellulitis of right lower limb: Secondary | ICD-10-CM | POA: Diagnosis not present

## 2016-05-11 DIAGNOSIS — Z5181 Encounter for therapeutic drug level monitoring: Secondary | ICD-10-CM

## 2016-05-11 DIAGNOSIS — I4891 Unspecified atrial fibrillation: Secondary | ICD-10-CM | POA: Diagnosis not present

## 2016-05-11 DIAGNOSIS — I2510 Atherosclerotic heart disease of native coronary artery without angina pectoris: Secondary | ICD-10-CM | POA: Diagnosis not present

## 2016-05-11 DIAGNOSIS — I87331 Chronic venous hypertension (idiopathic) with ulcer and inflammation of right lower extremity: Secondary | ICD-10-CM | POA: Diagnosis not present

## 2016-05-11 DIAGNOSIS — L97811 Non-pressure chronic ulcer of other part of right lower leg limited to breakdown of skin: Secondary | ICD-10-CM | POA: Diagnosis not present

## 2016-05-11 DIAGNOSIS — I509 Heart failure, unspecified: Secondary | ICD-10-CM | POA: Diagnosis not present

## 2016-05-11 DIAGNOSIS — I110 Hypertensive heart disease with heart failure: Secondary | ICD-10-CM | POA: Diagnosis not present

## 2016-05-11 LAB — POCT INR: INR: 2.5

## 2016-05-14 DIAGNOSIS — Z48817 Encounter for surgical aftercare following surgery on the skin and subcutaneous tissue: Secondary | ICD-10-CM | POA: Diagnosis not present

## 2016-05-14 DIAGNOSIS — I4891 Unspecified atrial fibrillation: Secondary | ICD-10-CM | POA: Diagnosis not present

## 2016-05-14 DIAGNOSIS — Z7901 Long term (current) use of anticoagulants: Secondary | ICD-10-CM | POA: Diagnosis not present

## 2016-05-14 DIAGNOSIS — I872 Venous insufficiency (chronic) (peripheral): Secondary | ICD-10-CM | POA: Diagnosis not present

## 2016-05-14 DIAGNOSIS — I2510 Atherosclerotic heart disease of native coronary artery without angina pectoris: Secondary | ICD-10-CM | POA: Diagnosis not present

## 2016-05-16 DIAGNOSIS — I872 Venous insufficiency (chronic) (peripheral): Secondary | ICD-10-CM | POA: Diagnosis not present

## 2016-05-16 DIAGNOSIS — Z48817 Encounter for surgical aftercare following surgery on the skin and subcutaneous tissue: Secondary | ICD-10-CM | POA: Diagnosis not present

## 2016-05-16 DIAGNOSIS — I4891 Unspecified atrial fibrillation: Secondary | ICD-10-CM | POA: Diagnosis not present

## 2016-05-16 DIAGNOSIS — Z7901 Long term (current) use of anticoagulants: Secondary | ICD-10-CM | POA: Diagnosis not present

## 2016-05-16 DIAGNOSIS — I2510 Atherosclerotic heart disease of native coronary artery without angina pectoris: Secondary | ICD-10-CM | POA: Diagnosis not present

## 2016-05-18 ENCOUNTER — Encounter (HOSPITAL_BASED_OUTPATIENT_CLINIC_OR_DEPARTMENT_OTHER): Payer: Self-pay

## 2016-05-18 ENCOUNTER — Encounter (HOSPITAL_BASED_OUTPATIENT_CLINIC_OR_DEPARTMENT_OTHER): Payer: Medicare Other | Attending: Internal Medicine

## 2016-05-18 DIAGNOSIS — I10 Essential (primary) hypertension: Secondary | ICD-10-CM | POA: Insufficient documentation

## 2016-05-18 DIAGNOSIS — I2510 Atherosclerotic heart disease of native coronary artery without angina pectoris: Secondary | ICD-10-CM | POA: Insufficient documentation

## 2016-05-18 DIAGNOSIS — I87331 Chronic venous hypertension (idiopathic) with ulcer and inflammation of right lower extremity: Secondary | ICD-10-CM | POA: Diagnosis not present

## 2016-05-18 DIAGNOSIS — I87311 Chronic venous hypertension (idiopathic) with ulcer of right lower extremity: Secondary | ICD-10-CM | POA: Diagnosis not present

## 2016-05-18 DIAGNOSIS — L97811 Non-pressure chronic ulcer of other part of right lower leg limited to breakdown of skin: Secondary | ICD-10-CM | POA: Diagnosis not present

## 2016-05-21 DIAGNOSIS — Z7901 Long term (current) use of anticoagulants: Secondary | ICD-10-CM | POA: Diagnosis not present

## 2016-05-21 DIAGNOSIS — I4891 Unspecified atrial fibrillation: Secondary | ICD-10-CM | POA: Diagnosis not present

## 2016-05-21 DIAGNOSIS — I872 Venous insufficiency (chronic) (peripheral): Secondary | ICD-10-CM | POA: Diagnosis not present

## 2016-05-21 DIAGNOSIS — Z48817 Encounter for surgical aftercare following surgery on the skin and subcutaneous tissue: Secondary | ICD-10-CM | POA: Diagnosis not present

## 2016-05-21 DIAGNOSIS — I2510 Atherosclerotic heart disease of native coronary artery without angina pectoris: Secondary | ICD-10-CM | POA: Diagnosis not present

## 2016-05-23 DIAGNOSIS — I4891 Unspecified atrial fibrillation: Secondary | ICD-10-CM | POA: Diagnosis not present

## 2016-05-23 DIAGNOSIS — I872 Venous insufficiency (chronic) (peripheral): Secondary | ICD-10-CM | POA: Diagnosis not present

## 2016-05-23 DIAGNOSIS — I2510 Atherosclerotic heart disease of native coronary artery without angina pectoris: Secondary | ICD-10-CM | POA: Diagnosis not present

## 2016-05-23 DIAGNOSIS — Z48817 Encounter for surgical aftercare following surgery on the skin and subcutaneous tissue: Secondary | ICD-10-CM | POA: Diagnosis not present

## 2016-05-23 DIAGNOSIS — Z7901 Long term (current) use of anticoagulants: Secondary | ICD-10-CM | POA: Diagnosis not present

## 2016-05-24 ENCOUNTER — Encounter: Payer: Medicare Other | Admitting: *Deleted

## 2016-05-24 ENCOUNTER — Telehealth: Payer: Self-pay | Admitting: Cardiology

## 2016-05-24 NOTE — Telephone Encounter (Signed)
Spoke w/ pt daughter and informed her that the remote transmission is not needed b/c pt does TTM and that is w/ another company.

## 2016-05-25 DIAGNOSIS — I87331 Chronic venous hypertension (idiopathic) with ulcer and inflammation of right lower extremity: Secondary | ICD-10-CM | POA: Diagnosis not present

## 2016-05-25 DIAGNOSIS — I10 Essential (primary) hypertension: Secondary | ICD-10-CM | POA: Diagnosis not present

## 2016-05-25 DIAGNOSIS — I87311 Chronic venous hypertension (idiopathic) with ulcer of right lower extremity: Secondary | ICD-10-CM | POA: Diagnosis not present

## 2016-05-25 DIAGNOSIS — L97811 Non-pressure chronic ulcer of other part of right lower leg limited to breakdown of skin: Secondary | ICD-10-CM | POA: Diagnosis not present

## 2016-05-25 DIAGNOSIS — I2510 Atherosclerotic heart disease of native coronary artery without angina pectoris: Secondary | ICD-10-CM | POA: Diagnosis not present

## 2016-05-28 DIAGNOSIS — Z7901 Long term (current) use of anticoagulants: Secondary | ICD-10-CM | POA: Diagnosis not present

## 2016-05-28 DIAGNOSIS — I4891 Unspecified atrial fibrillation: Secondary | ICD-10-CM | POA: Diagnosis not present

## 2016-05-28 DIAGNOSIS — Z48817 Encounter for surgical aftercare following surgery on the skin and subcutaneous tissue: Secondary | ICD-10-CM | POA: Diagnosis not present

## 2016-05-28 DIAGNOSIS — I2510 Atherosclerotic heart disease of native coronary artery without angina pectoris: Secondary | ICD-10-CM | POA: Diagnosis not present

## 2016-05-28 DIAGNOSIS — I872 Venous insufficiency (chronic) (peripheral): Secondary | ICD-10-CM | POA: Diagnosis not present

## 2016-05-30 ENCOUNTER — Encounter: Payer: Self-pay | Admitting: Family

## 2016-05-30 ENCOUNTER — Other Ambulatory Visit (INDEPENDENT_AMBULATORY_CARE_PROVIDER_SITE_OTHER): Payer: Medicare Other

## 2016-05-30 ENCOUNTER — Ambulatory Visit (INDEPENDENT_AMBULATORY_CARE_PROVIDER_SITE_OTHER): Payer: Medicare Other | Admitting: Family

## 2016-05-30 VITALS — BP 112/50 | HR 75 | Temp 98.5°F | Resp 12 | Ht 59.0 in | Wt 85.0 lb

## 2016-05-30 DIAGNOSIS — R42 Dizziness and giddiness: Secondary | ICD-10-CM | POA: Diagnosis not present

## 2016-05-30 DIAGNOSIS — Z48817 Encounter for surgical aftercare following surgery on the skin and subcutaneous tissue: Secondary | ICD-10-CM | POA: Diagnosis not present

## 2016-05-30 DIAGNOSIS — M25511 Pain in right shoulder: Secondary | ICD-10-CM

## 2016-05-30 DIAGNOSIS — M25512 Pain in left shoulder: Secondary | ICD-10-CM | POA: Diagnosis not present

## 2016-05-30 DIAGNOSIS — I4891 Unspecified atrial fibrillation: Secondary | ICD-10-CM | POA: Diagnosis not present

## 2016-05-30 DIAGNOSIS — R198 Other specified symptoms and signs involving the digestive system and abdomen: Secondary | ICD-10-CM | POA: Diagnosis not present

## 2016-05-30 DIAGNOSIS — R1310 Dysphagia, unspecified: Secondary | ICD-10-CM | POA: Insufficient documentation

## 2016-05-30 DIAGNOSIS — I872 Venous insufficiency (chronic) (peripheral): Secondary | ICD-10-CM | POA: Diagnosis not present

## 2016-05-30 DIAGNOSIS — Z7901 Long term (current) use of anticoagulants: Secondary | ICD-10-CM | POA: Diagnosis not present

## 2016-05-30 DIAGNOSIS — I2510 Atherosclerotic heart disease of native coronary artery without angina pectoris: Secondary | ICD-10-CM | POA: Diagnosis not present

## 2016-05-30 LAB — COMPREHENSIVE METABOLIC PANEL
ALBUMIN: 4.3 g/dL (ref 3.5–5.2)
ALT: 18 U/L (ref 0–35)
AST: 54 U/L — AB (ref 0–37)
Alkaline Phosphatase: 60 U/L (ref 39–117)
BILIRUBIN TOTAL: 0.4 mg/dL (ref 0.2–1.2)
BUN: 28 mg/dL — AB (ref 6–23)
CALCIUM: 9.5 mg/dL (ref 8.4–10.5)
CO2: 29 meq/L (ref 19–32)
CREATININE: 0.69 mg/dL (ref 0.40–1.20)
Chloride: 100 mEq/L (ref 96–112)
GFR: 83.96 mL/min (ref >60.00)
Glucose, Bld: 87 mg/dL (ref 70–99)
Potassium: 4.7 mEq/L (ref 3.5–5.1)
SODIUM: 136 meq/L (ref 135–145)
Total Protein: 7.1 g/dL (ref 6.0–8.3)

## 2016-05-30 LAB — CBC
HEMATOCRIT: 33.2 % — AB (ref 36.0–46.0)
Hemoglobin: 11.1 g/dL — ABNORMAL LOW (ref 12.0–15.0)
MCHC: 33.3 g/dL (ref 30.0–36.0)
MCV: 104.9 fl — AB (ref 78.0–100.0)
Platelets: 156 10*3/uL (ref 150.0–400.0)
RBC: 3.17 Mil/uL — AB (ref 3.87–5.11)
RDW: 15.5 % (ref 11.5–15.5)
WBC: 4.9 10*3/uL (ref 4.0–10.5)

## 2016-05-30 NOTE — Assessment & Plan Note (Signed)
Bilateral shoulder pain with concern for complete rotator cuff tear that has been refractory to cortisone injections by orthopedics. Continue ice and tylenol as needed for discomfort. Tramadol for breakthrough pain. Recommend follow up with orthopedics for further evaluation and treatment. Will consider additional pain medication if necessary.

## 2016-05-30 NOTE — Assessment & Plan Note (Signed)
Dizziness with possible relation to blood pressure medication or dehydration. Encouraged to drink plenty of fluids. Obtain CBC and CMET to rule out metabolic causes. Follow up pending blood work if symptoms worsen or do not improve pending blood work.

## 2016-05-30 NOTE — Progress Notes (Signed)
Pre visit review using our clinic review tool, if applicable. No additional management support is needed unless otherwise documented below in the visit note. 

## 2016-05-30 NOTE — Patient Instructions (Addendum)
Thank you for choosing [facility].  Summary/Instructions:  Please continue to take your medications as prescribed.  Crush the pills that you are able to. If we need to we can change the medications to be easier to take.   Follow up with Dr. Veverly Fells for your shoulders.   If your symptoms worsen or fail to improve, please contact our office for further instruction, or in case of emergency go directly to the emergency room at the closest medical facility.

## 2016-05-30 NOTE — Progress Notes (Signed)
Subjective:    Patient ID: [name], female    DOB: 08/10/1919, 83 y.o.   MRN: [id number]  Chief Complaint  Patient presents with  . Acute Visit    trouble swallowing pills, bilateral shoulder pain - ongoing    HPI:  [name] is a 82 y.o. female who  has a past medical history of CAD (coronary artery disease); Ostium secundum type atrial septal defect; AV block; Venous insufficiency; Pure hypercholesterolemia; Diverticulosis of colon (without mention of hemorrhage); Overactive bladder; DJD (degenerative joint disease); LBP (low back pain); Chronic pain syndrome; Osteoporosis; and Anemia. and presents today for an office visit.   1.) Trouble swallowing - This is a new problem. Associated symptom of difficulty swallowing pills has been going on for about 1 month. Notes the difficulty is only with pills and not liquid. Denies any coughing with meals or food. No fevers. Family notes that she has a lot of drainage on occasion. Endorses that when she coughs up a little blood on occasion but has improved without further incidence.   2.) Bilateral shoulder pain - This is a chronic problem. Associated symptom of pain located in her left shoulder has been going on for about 3 weeks. Describes that she was reaching for something when she felt a pop and immediate pain. She has been seen by [employer]. Modifying factors include cortisone injections in both shoulders which has not helped. Course of the symptoms has stayed about the same with some worsening pain. She is right hand dominant.    Allergies  Allergen Reactions  . Amiodarone Hcl     REACTION: INTOL to Amiodarone in past  . Codeine Nausea And Vomiting  . Fentanyl     REACTION: causes nausea--dizziness  . Lamisil [Terbinafine Hcl]     Causes nausea     Current Outpatient Prescriptions on File Prior to Visit  Medication Sig Dispense Refill  . acetaminophen (TYLENOL) 500 MG tablet Take 650 mg by mouth every 8 (eight)  hours as needed. Take with tramadol as needed for pain    . amLODipine (NORVASC) 5 MG tablet Take 1 tablet (5 mg total) by mouth daily. 30 tablet 11  . Ascorbic Acid (VITAMIN C) 1000 MG tablet Take 1,000 mg by mouth daily.      . Biotin 1000 MCG tablet Take 1,000 mcg by mouth daily.      . Calcium Carbonate-Vitamin D (CALCIUM 600+D) 600-400 MG-UNIT per tablet Take 2 tablets by mouth daily.      . Cholecalciferol (VITAMIN D) 1000 UNITS capsule Take 1,000 Units by mouth daily.      . ferrous sulfate 325 (65 FE) MG tablet Take 325 mg by mouth daily with breakfast.     . fluticasone (FLONASE) 50 MCG/ACT nasal spray Place 2 sprays into the nose daily as needed for allergies.     . furosemide (LASIX) 40 MG tablet TAKE 1 TABLET EVERY OTHER DAY ALTERNATING WITH 2 TABLETS EVERY OTHER DAY 135 tablet 2  . Glucosamine 500 MG CAPS Take 500 mg by mouth daily.     Marland Kitchen guaiFENesin (MUCINEX) 600 MG 12 hr tablet Take 1 tablet (600 mg total) by mouth 2 (two) times daily. 30 tablet 0  . Multiple Vitamins-Minerals (PRESERVISION AREDS) TABS Take 1 tablet by mouth daily.    . Omega-3 350 MG CAPS Take 1 capsule by mouth every evening.     Marland Kitchen omeprazole (PRILOSEC) 20 MG capsule Take 20 mg by mouth daily as needed (  acid reflux).     . potassium chloride (K-DUR) 10 MEQ tablet Take 10 mEq by mouth 2 (two) times daily. And as needed when taking furosemide.    . tolterodine (DETROL) 2 MG tablet Take 2 mg by mouth 2 (two) times daily.    . traMADol (ULTRAM) 50 MG tablet Take 1 tablet (50 mg total) by mouth every 6 (six) hours as needed for moderate pain. 30 tablet 0  . vitamin A 8000 UNIT capsule Take 8,000 Units by mouth every other day.      . vitamin E 400 UNIT capsule Take 400 Units by mouth daily.      Marland Kitchen warfarin (COUMADIN) 5 MG tablet TAKE AS DIRECTED BY COUMADIN CLINIC 90 tablet 1   No current facility-administered medications on file prior to visit.     Past Surgical History  Procedure Laterality Date  .  Vesicovaginal fistula closure w/ tah  1973  . Cataract extraction    . Asd repair    . Pacemaker placement  [place]  . Inguinal hernia repair  2011    bilaterally  . Shoulder surgery      x 2  . Ganglion cyst excision    . Partial hysterectomy       Review of Systems  Constitutional: Negative for fever and chills.  Cardiovascular: Negative for chest pain, palpitations and leg swelling.  Musculoskeletal:       Positive for bilateral shoulder pain.  Hematological: Bruises/bleeds easily.      Objective:    BP 112/50 mmHg  Pulse 75  Temp(Src) 98.5 F (36.9 C) (Oral)  Resp 12  Ht '4\' 11"'  (1.499 m)  Wt 85 lb (38.556 kg)  BMI 17.16 kg/m2  SpO2 97%  LMP  (LMP Unknown) Nursing note and vital signs reviewed.  Physical Exam  Constitutional: She is oriented to person, place, and time. She appears well-developed and well-nourished. No distress.  Cardiovascular: Normal rate, regular rhythm, normal heart sounds and intact distal pulses.   Pulmonary/Chest: Effort normal and breath sounds normal.  Musculoskeletal:  Bilateral shoulders - no obvious discoloration or edema. Left shoulder appears slightly elevated compared to right shoulder. There is generalized tenderness anteriorly and laterally in the subacromial space. Range of motion is severely limited below 75 of flexion and abduction. Muscle strength is 2-3+ in external rotation. Distal pulses and sensation are intact and appropriate.  Neurological: She is alert and oriented to person, place, and time.  Skin: Skin is warm and dry.  Psychiatric: She has a normal mood and affect. Her behavior is normal. Judgment and thought content normal.       Assessment & Plan:   Problem List Items Addressed This Visit      Other   Difficulty swallowing pills - Primary    Difficulty swallowing pills with no difficulties with other foods or liquids. No evidence of aspiration. Does not wish to pursue further swallowing evaluation at  this time. Recommend crushing pills that are causing problems or consuming whole in applesauce. If continues to experience issues or symptoms worsen will pursue further evaluation through speech therapy.      Bilateral shoulder pain    Bilateral shoulder pain with concern for complete rotator cuff tear that has been refractory to cortisone injections by orthopedics. Continue ice and tylenol as needed for discomfort. Tramadol for breakthrough pain. Recommend follow up with orthopedics for further evaluation and treatment. Will consider additional pain medication if necessary.  Dizziness    Dizziness with possible relation to blood pressure medication or dehydration. Encouraged to drink plenty of fluids. Obtain CBC and CMET to rule out metabolic causes. Follow up pending blood work if symptoms worsen or do not improve pending blood work.       Relevant Orders   Comp Met (CMET) (Completed)   CBC (Completed)       I am having Ms. Lovering maintain her Biotin, Calcium Carbonate-Vitamin D, ferrous sulfate, Glucosamine, Omega-3, acetaminophen, vitamin A, vitamin C, Vitamin D, vitamin E, omeprazole, fluticasone, traMADol, PRESERVISION AREDS, tolterodine, guaiFENesin, potassium chloride, furosemide, amLODipine, and warfarin.   Follow-up: Return in about 3 months (around 08/30/2016), or if symptoms worsen or fail to improve.  Mauricio Po, FNP

## 2016-05-30 NOTE — Assessment & Plan Note (Addendum)
Difficulty swallowing pills with no difficulties with other foods or liquids. No evidence of aspiration. Does not wish to pursue further swallowing evaluation at this time. Recommend crushing pills that are causing problems or consuming whole in applesauce. If continues to experience issues or symptoms worsen will pursue further evaluation through speech therapy.

## 2016-06-01 DIAGNOSIS — I87331 Chronic venous hypertension (idiopathic) with ulcer and inflammation of right lower extremity: Secondary | ICD-10-CM | POA: Diagnosis not present

## 2016-06-01 DIAGNOSIS — I2510 Atherosclerotic heart disease of native coronary artery without angina pectoris: Secondary | ICD-10-CM | POA: Diagnosis not present

## 2016-06-01 DIAGNOSIS — L97811 Non-pressure chronic ulcer of other part of right lower leg limited to breakdown of skin: Secondary | ICD-10-CM | POA: Diagnosis not present

## 2016-06-01 DIAGNOSIS — I87311 Chronic venous hypertension (idiopathic) with ulcer of right lower extremity: Secondary | ICD-10-CM | POA: Diagnosis not present

## 2016-06-01 DIAGNOSIS — I10 Essential (primary) hypertension: Secondary | ICD-10-CM | POA: Diagnosis not present

## 2016-06-03 DIAGNOSIS — S81811D Laceration without foreign body, right lower leg, subsequent encounter: Secondary | ICD-10-CM | POA: Diagnosis not present

## 2016-06-03 DIAGNOSIS — I4891 Unspecified atrial fibrillation: Secondary | ICD-10-CM | POA: Diagnosis not present

## 2016-06-03 DIAGNOSIS — I2510 Atherosclerotic heart disease of native coronary artery without angina pectoris: Secondary | ICD-10-CM | POA: Diagnosis not present

## 2016-06-03 DIAGNOSIS — Z7901 Long term (current) use of anticoagulants: Secondary | ICD-10-CM | POA: Diagnosis not present

## 2016-06-03 DIAGNOSIS — I872 Venous insufficiency (chronic) (peripheral): Secondary | ICD-10-CM | POA: Diagnosis not present

## 2016-06-04 DIAGNOSIS — I2510 Atherosclerotic heart disease of native coronary artery without angina pectoris: Secondary | ICD-10-CM | POA: Diagnosis not present

## 2016-06-04 DIAGNOSIS — I4891 Unspecified atrial fibrillation: Secondary | ICD-10-CM | POA: Diagnosis not present

## 2016-06-04 DIAGNOSIS — Z7901 Long term (current) use of anticoagulants: Secondary | ICD-10-CM | POA: Diagnosis not present

## 2016-06-04 DIAGNOSIS — S81811D Laceration without foreign body, right lower leg, subsequent encounter: Secondary | ICD-10-CM | POA: Diagnosis not present

## 2016-06-04 DIAGNOSIS — I872 Venous insufficiency (chronic) (peripheral): Secondary | ICD-10-CM | POA: Diagnosis not present

## 2016-06-06 DIAGNOSIS — I2510 Atherosclerotic heart disease of native coronary artery without angina pectoris: Secondary | ICD-10-CM | POA: Diagnosis not present

## 2016-06-06 DIAGNOSIS — S81811D Laceration without foreign body, right lower leg, subsequent encounter: Secondary | ICD-10-CM | POA: Diagnosis not present

## 2016-06-06 DIAGNOSIS — I4891 Unspecified atrial fibrillation: Secondary | ICD-10-CM | POA: Diagnosis not present

## 2016-06-06 DIAGNOSIS — I872 Venous insufficiency (chronic) (peripheral): Secondary | ICD-10-CM | POA: Diagnosis not present

## 2016-06-06 DIAGNOSIS — Z7901 Long term (current) use of anticoagulants: Secondary | ICD-10-CM | POA: Diagnosis not present

## 2016-06-08 ENCOUNTER — Ambulatory Visit (INDEPENDENT_AMBULATORY_CARE_PROVIDER_SITE_OTHER): Payer: Medicare Other | Admitting: Family

## 2016-06-08 ENCOUNTER — Encounter: Payer: Self-pay | Admitting: Family

## 2016-06-08 ENCOUNTER — Ambulatory Visit (INDEPENDENT_AMBULATORY_CARE_PROVIDER_SITE_OTHER): Payer: Medicare Other | Admitting: *Deleted

## 2016-06-08 VITALS — BP 124/58 | HR 90 | Temp 97.6°F | Resp 16 | Ht 59.0 in | Wt 86.0 lb

## 2016-06-08 DIAGNOSIS — I4891 Unspecified atrial fibrillation: Secondary | ICD-10-CM | POA: Diagnosis not present

## 2016-06-08 DIAGNOSIS — Z5181 Encounter for therapeutic drug level monitoring: Secondary | ICD-10-CM | POA: Diagnosis not present

## 2016-06-08 DIAGNOSIS — R05 Cough: Secondary | ICD-10-CM

## 2016-06-08 DIAGNOSIS — I10 Essential (primary) hypertension: Secondary | ICD-10-CM | POA: Diagnosis not present

## 2016-06-08 DIAGNOSIS — R059 Cough, unspecified: Secondary | ICD-10-CM

## 2016-06-08 DIAGNOSIS — L97811 Non-pressure chronic ulcer of other part of right lower leg limited to breakdown of skin: Secondary | ICD-10-CM | POA: Diagnosis not present

## 2016-06-08 DIAGNOSIS — Z09 Encounter for follow-up examination after completed treatment for conditions other than malignant neoplasm: Secondary | ICD-10-CM | POA: Diagnosis not present

## 2016-06-08 DIAGNOSIS — I87331 Chronic venous hypertension (idiopathic) with ulcer and inflammation of right lower extremity: Secondary | ICD-10-CM | POA: Diagnosis not present

## 2016-06-08 DIAGNOSIS — I2510 Atherosclerotic heart disease of native coronary artery without angina pectoris: Secondary | ICD-10-CM | POA: Diagnosis not present

## 2016-06-08 DIAGNOSIS — Z872 Personal history of diseases of the skin and subcutaneous tissue: Secondary | ICD-10-CM | POA: Diagnosis not present

## 2016-06-08 LAB — POCT INR: INR: 2.4

## 2016-06-08 MED ORDER — CEFUROXIME AXETIL 500 MG PO TABS: 500.0000 mg | ORAL_TABLET | Freq: Two times a day (BID) | ORAL | 0 refills | Status: DC

## 2016-06-08 NOTE — Assessment & Plan Note (Signed)
Symptoms and exam concern for bronchitis with no evidence of pneumonia. Start Ceftin. Continue over the counter medications as needed for symptom relief and supportive care. Follow up if symptoms worsen or do not improve.

## 2016-06-08 NOTE — Progress Notes (Signed)
Subjective:    Patient ID: [name], female    DOB: 09/04/1919, 82 y.o.   MRN: [id number]  Chief Complaint  Patient presents with  . Cough    x2 weeks has been coughing up alot of phlem, some of it is bloody would like a chest xray, wheezing    HPI:  [name] is a 82 y.o. female who  has a past medical history of Anemia; AV block; CAD (coronary artery disease); Chronic pain syndrome; Diverticulosis of colon (without mention of hemorrhage); DJD (degenerative joint disease); LBP (low back pain); Osteoporosis; Ostium secundum type atrial septal defect; Overactive bladder; Pure hypercholesterolemia; and Venous insufficiency. and presents today   This is a a new problem. Associated symptom of productive cough that has been going on for about 2 weeks. Denies fevers. Sputum does have some blood on occasion. Home Health nurse with concern for possible wheezing. Modifying factors include Mucinex. Denies any recent antibiotics. Does have significant history of sinus issues.   Allergies  Allergen Reactions  . Amiodarone Hcl     REACTION: INTOL to Amiodarone in past  . Codeine Nausea And Vomiting  . Fentanyl     REACTION: causes nausea--dizziness  . Lamisil [Terbinafine Hcl]     Causes nausea     Current Outpatient Prescriptions on File Prior to Visit  Medication Sig Dispense Refill  . acetaminophen (TYLENOL) 500 MG tablet Take 650 mg by mouth every 8 (eight) hours as needed. Take with tramadol as needed for pain    . amLODipine (NORVASC) 5 MG tablet Take 1 tablet (5 mg total) by mouth daily. 30 tablet 11  . Ascorbic Acid (VITAMIN C) 1000 MG tablet Take 1,000 mg by mouth daily.      . Biotin 1000 MCG tablet Take 1,000 mcg by mouth daily.      . Calcium Carbonate-Vitamin D (CALCIUM 600+D) 600-400 MG-UNIT per tablet Take 2 tablets by mouth daily.      . Cholecalciferol (VITAMIN D) 1000 UNITS capsule Take 1,000 Units by mouth daily.      . ferrous sulfate 325 (65 FE) MG tablet Take  325 mg by mouth daily with breakfast.     . fluticasone (FLONASE) 50 MCG/ACT nasal spray Place 2 sprays into the nose daily as needed for allergies.     . furosemide (LASIX) 40 MG tablet TAKE 1 TABLET EVERY OTHER DAY ALTERNATING WITH 2 TABLETS EVERY OTHER DAY 135 tablet 2  . Glucosamine 500 MG CAPS Take 500 mg by mouth daily.     Marland Kitchen guaiFENesin (MUCINEX) 600 MG 12 hr tablet Take 1 tablet (600 mg total) by mouth 2 (two) times daily. 30 tablet 0  . Multiple Vitamins-Minerals (PRESERVISION AREDS) TABS Take 1 tablet by mouth daily.    . Omega-3 350 MG CAPS Take 1 capsule by mouth every evening.     Marland Kitchen omeprazole (PRILOSEC) 20 MG capsule Take 20 mg by mouth daily as needed (acid reflux).     . potassium chloride (K-DUR) 10 MEQ tablet Take 10 mEq by mouth 2 (two) times daily. And as needed when taking furosemide.    . tolterodine (DETROL) 2 MG tablet Take 2 mg by mouth 2 (two) times daily.    . traMADol (ULTRAM) 50 MG tablet Take 1 tablet (50 mg total) by mouth every 6 (six) hours as needed for moderate pain. 30 tablet 0  . vitamin A 8000 UNIT capsule Take 8,000 Units by mouth every other day.      Marland Kitchen  vitamin E 400 UNIT capsule Take 400 Units by mouth daily.      Marland Kitchen warfarin (COUMADIN) 5 MG tablet TAKE AS DIRECTED BY COUMADIN CLINIC 90 tablet 1   No current facility-administered medications on file prior to visit.     Review of Systems  Constitutional: Negative for chills and fever.  HENT: Positive for congestion. Negative for sinus pressure and sore throat.   Respiratory: Positive for cough.   Gastrointestinal: Negative for nausea and vomiting.  Neurological: Negative for headaches.      Objective:    BP (!) 124/58 (BP Location: Left Arm, Patient Position: Sitting, Cuff Size: Normal)   Pulse 90   Temp 97.6 F (36.4 C) (Oral)   Resp 16   Ht 4\' 11"  (1.499 m)   Wt 86 lb (39 kg)   LMP  (LMP Unknown)   SpO2 95%   BMI 17.37 kg/m  Nursing note and vital signs reviewed.  Physical Exam    Constitutional: She is oriented to person, place, and time. She appears well-developed and well-nourished. No distress.  HENT:  Right Ear: Hearing, tympanic membrane, external ear and ear canal normal.  Left Ear: Hearing, tympanic membrane, external ear and ear canal normal.  Nose: Nose normal.  Mouth/Throat: Uvula is midline, oropharynx is clear and moist and mucous membranes are normal.  Cardiovascular: Normal rate, regular rhythm, normal heart sounds and intact distal pulses.   Pulmonary/Chest: Effort normal. She has wheezes.  Neurological: She is alert and oriented to person, place, and time.  Skin: Skin is warm and dry.  Psychiatric: She has a normal mood and affect. Her behavior is normal. Judgment and thought content normal.       Assessment & Plan:   Problem List Items Addressed This Visit      Other   Cough - Primary    Symptoms and exam concern for bronchitis with no evidence of pneumonia. Start Ceftin. Continue over the counter medications as needed for symptom relief and supportive care. Follow up if symptoms worsen or do not improve.        Other Visit Diagnoses   None.      I am having Ms. Kautzman start on cefUROXime. I am also having her maintain her Biotin, Calcium Carbonate-Vitamin D, ferrous sulfate, Glucosamine, Omega-3, acetaminophen, vitamin A, vitamin C, Vitamin D, vitamin E, omeprazole, fluticasone, traMADol, PRESERVISION AREDS, tolterodine, guaiFENesin, potassium chloride, furosemide, amLODipine, and warfarin.   Meds ordered this encounter  Medications  . cefUROXime (CEFTIN) 500 MG tablet    Sig: Take 1 tablet (500 mg total) by mouth 2 (two) times daily with a meal.    Dispense:  14 tablet    Refill:  0    Order Specific Question:   Supervising Provider    Answer:   Pricilla Holm A [id number]     Follow-up: Return if symptoms worsen or fail to improve.  Mauricio Po, FNP

## 2016-06-08 NOTE — Patient Instructions (Addendum)
Thank you for choosing [facility].  Summary/Instructions:  Your prescription(s) have been submitted to your pharmacy or been printed and provided for you. Please take as directed and contact our office if you believe you are having problem(s) with the medication(s) or have any questions.  If your symptoms worsen or fail to improve, please contact our office for further instruction, or in case of emergency go directly to the emergency room at the closest medical facility.   General Recommendations:    Please drink plenty of fluids.  Get plenty of rest   Sleep in humidified air  Use saline nasal sprays  Netti pot   OTC Medications:  Decongestants - helps relieve congestion   Flonase (generic fluticasone) or Nasacort (generic triamcinolone) - please make sure to use the "cross-over" technique at a 45 degree angle towards the opposite eye as opposed to straight up the nasal passageway.   Sudafed (generic pseudoephedrine - Note this is the one that is available behind the pharmacy counter); Products with phenylephrine (-PE) may also be used but is often not as effective as pseudoephedrine.   If you have HIGH BLOOD PRESSURE - Coricidin HBP; AVOID any product that is -D as this contains pseudoephedrine which may increase your blood pressure.  Afrin (oxymetazoline) every 6-8 hours for up to 3 days.   Allergies - helps relieve runny nose, itchy eyes and sneezing   Claritin (generic loratidine), Allegra (fexofenidine), or Zyrtec (generic cyrterizine) for runny nose. These medications should not cause drowsiness.  Note - Benadryl (generic diphenhydramine) may be used however may cause drowsiness  Cough -   Delsym or Robitussin (generic dextromethorphan)  Expectorants - helps loosen mucus to ease removal   Mucinex (generic guaifenesin) as directed on the package.  Headaches / General Aches   Tylenol (generic acetaminophen) - DO NOT EXCEED 3 grams (3,000 mg) in a 24  hour time period  Advil/Motrin (generic ibuprofen)   Sore Throat -   Salt water gargle   Chloraseptic (generic benzocaine) spray or lozenges / Sucrets (generic dyclonine)      

## 2016-06-11 DIAGNOSIS — I872 Venous insufficiency (chronic) (peripheral): Secondary | ICD-10-CM | POA: Diagnosis not present

## 2016-06-11 DIAGNOSIS — S81811D Laceration without foreign body, right lower leg, subsequent encounter: Secondary | ICD-10-CM | POA: Diagnosis not present

## 2016-06-11 DIAGNOSIS — I4891 Unspecified atrial fibrillation: Secondary | ICD-10-CM | POA: Diagnosis not present

## 2016-06-11 DIAGNOSIS — I2510 Atherosclerotic heart disease of native coronary artery without angina pectoris: Secondary | ICD-10-CM | POA: Diagnosis not present

## 2016-06-11 DIAGNOSIS — Z7901 Long term (current) use of anticoagulants: Secondary | ICD-10-CM | POA: Diagnosis not present

## 2016-06-13 DIAGNOSIS — I4891 Unspecified atrial fibrillation: Secondary | ICD-10-CM | POA: Diagnosis not present

## 2016-06-13 DIAGNOSIS — I2510 Atherosclerotic heart disease of native coronary artery without angina pectoris: Secondary | ICD-10-CM | POA: Diagnosis not present

## 2016-06-13 DIAGNOSIS — I872 Venous insufficiency (chronic) (peripheral): Secondary | ICD-10-CM | POA: Diagnosis not present

## 2016-06-13 DIAGNOSIS — Z7901 Long term (current) use of anticoagulants: Secondary | ICD-10-CM | POA: Diagnosis not present

## 2016-06-13 DIAGNOSIS — S81811D Laceration without foreign body, right lower leg, subsequent encounter: Secondary | ICD-10-CM | POA: Diagnosis not present

## 2016-06-14 DIAGNOSIS — M5441 Lumbago with sciatica, right side: Secondary | ICD-10-CM | POA: Diagnosis not present

## 2016-06-14 DIAGNOSIS — Z79891 Long term (current) use of opiate analgesic: Secondary | ICD-10-CM | POA: Diagnosis not present

## 2016-06-14 DIAGNOSIS — M5136 Other intervertebral disc degeneration, lumbar region: Secondary | ICD-10-CM | POA: Diagnosis not present

## 2016-06-14 DIAGNOSIS — M4807 Spinal stenosis, lumbosacral region: Secondary | ICD-10-CM | POA: Diagnosis not present

## 2016-06-15 ENCOUNTER — Encounter (HOSPITAL_BASED_OUTPATIENT_CLINIC_OR_DEPARTMENT_OTHER): Payer: Medicare Other | Attending: Internal Medicine

## 2016-06-15 DIAGNOSIS — L03115 Cellulitis of right lower limb: Secondary | ICD-10-CM | POA: Diagnosis not present

## 2016-06-15 DIAGNOSIS — S8011XA Contusion of right lower leg, initial encounter: Secondary | ICD-10-CM | POA: Diagnosis not present

## 2016-06-15 DIAGNOSIS — I10 Essential (primary) hypertension: Secondary | ICD-10-CM | POA: Insufficient documentation

## 2016-06-15 DIAGNOSIS — I739 Peripheral vascular disease, unspecified: Secondary | ICD-10-CM | POA: Insufficient documentation

## 2016-06-15 DIAGNOSIS — W208XXA Other cause of strike by thrown, projected or falling object, initial encounter: Secondary | ICD-10-CM | POA: Diagnosis not present

## 2016-06-15 DIAGNOSIS — S81811A Laceration without foreign body, right lower leg, initial encounter: Secondary | ICD-10-CM | POA: Insufficient documentation

## 2016-06-15 DIAGNOSIS — I2510 Atherosclerotic heart disease of native coronary artery without angina pectoris: Secondary | ICD-10-CM | POA: Insufficient documentation

## 2016-06-15 DIAGNOSIS — D649 Anemia, unspecified: Secondary | ICD-10-CM | POA: Insufficient documentation

## 2016-06-19 DIAGNOSIS — I2510 Atherosclerotic heart disease of native coronary artery without angina pectoris: Secondary | ICD-10-CM | POA: Diagnosis not present

## 2016-06-19 DIAGNOSIS — I872 Venous insufficiency (chronic) (peripheral): Secondary | ICD-10-CM | POA: Diagnosis not present

## 2016-06-19 DIAGNOSIS — I4891 Unspecified atrial fibrillation: Secondary | ICD-10-CM | POA: Diagnosis not present

## 2016-06-19 DIAGNOSIS — Z7901 Long term (current) use of anticoagulants: Secondary | ICD-10-CM | POA: Diagnosis not present

## 2016-06-19 DIAGNOSIS — S81811D Laceration without foreign body, right lower leg, subsequent encounter: Secondary | ICD-10-CM | POA: Diagnosis not present

## 2016-06-22 DIAGNOSIS — I10 Essential (primary) hypertension: Secondary | ICD-10-CM | POA: Diagnosis not present

## 2016-06-22 DIAGNOSIS — D649 Anemia, unspecified: Secondary | ICD-10-CM | POA: Diagnosis not present

## 2016-06-22 DIAGNOSIS — L03115 Cellulitis of right lower limb: Secondary | ICD-10-CM | POA: Diagnosis not present

## 2016-06-22 DIAGNOSIS — S81811A Laceration without foreign body, right lower leg, initial encounter: Secondary | ICD-10-CM | POA: Diagnosis not present

## 2016-06-22 DIAGNOSIS — I2510 Atherosclerotic heart disease of native coronary artery without angina pectoris: Secondary | ICD-10-CM | POA: Diagnosis not present

## 2016-06-22 DIAGNOSIS — S8011XA Contusion of right lower leg, initial encounter: Secondary | ICD-10-CM | POA: Diagnosis not present

## 2016-06-22 DIAGNOSIS — I739 Peripheral vascular disease, unspecified: Secondary | ICD-10-CM | POA: Diagnosis not present

## 2016-06-25 DIAGNOSIS — I4891 Unspecified atrial fibrillation: Secondary | ICD-10-CM | POA: Diagnosis not present

## 2016-06-25 DIAGNOSIS — Z7901 Long term (current) use of anticoagulants: Secondary | ICD-10-CM | POA: Diagnosis not present

## 2016-06-25 DIAGNOSIS — I2510 Atherosclerotic heart disease of native coronary artery without angina pectoris: Secondary | ICD-10-CM | POA: Diagnosis not present

## 2016-06-25 DIAGNOSIS — S81811D Laceration without foreign body, right lower leg, subsequent encounter: Secondary | ICD-10-CM | POA: Diagnosis not present

## 2016-06-25 DIAGNOSIS — I872 Venous insufficiency (chronic) (peripheral): Secondary | ICD-10-CM | POA: Diagnosis not present

## 2016-06-27 DIAGNOSIS — I4891 Unspecified atrial fibrillation: Secondary | ICD-10-CM | POA: Diagnosis not present

## 2016-06-27 DIAGNOSIS — I872 Venous insufficiency (chronic) (peripheral): Secondary | ICD-10-CM | POA: Diagnosis not present

## 2016-06-27 DIAGNOSIS — Z7901 Long term (current) use of anticoagulants: Secondary | ICD-10-CM | POA: Diagnosis not present

## 2016-06-27 DIAGNOSIS — S81811D Laceration without foreign body, right lower leg, subsequent encounter: Secondary | ICD-10-CM | POA: Diagnosis not present

## 2016-06-27 DIAGNOSIS — I2510 Atherosclerotic heart disease of native coronary artery without angina pectoris: Secondary | ICD-10-CM | POA: Diagnosis not present

## 2016-06-28 DIAGNOSIS — I2510 Atherosclerotic heart disease of native coronary artery without angina pectoris: Secondary | ICD-10-CM | POA: Diagnosis not present

## 2016-06-28 DIAGNOSIS — S8011XA Contusion of right lower leg, initial encounter: Secondary | ICD-10-CM | POA: Diagnosis not present

## 2016-06-28 DIAGNOSIS — D649 Anemia, unspecified: Secondary | ICD-10-CM | POA: Diagnosis not present

## 2016-06-28 DIAGNOSIS — M79609 Pain in unspecified limb: Secondary | ICD-10-CM | POA: Diagnosis not present

## 2016-06-28 DIAGNOSIS — L03115 Cellulitis of right lower limb: Secondary | ICD-10-CM | POA: Diagnosis not present

## 2016-06-28 DIAGNOSIS — I739 Peripheral vascular disease, unspecified: Secondary | ICD-10-CM | POA: Diagnosis not present

## 2016-06-28 DIAGNOSIS — I10 Essential (primary) hypertension: Secondary | ICD-10-CM | POA: Diagnosis not present

## 2016-06-28 DIAGNOSIS — S81811A Laceration without foreign body, right lower leg, initial encounter: Secondary | ICD-10-CM | POA: Diagnosis not present

## 2016-07-02 DIAGNOSIS — I872 Venous insufficiency (chronic) (peripheral): Secondary | ICD-10-CM | POA: Diagnosis not present

## 2016-07-02 DIAGNOSIS — I2510 Atherosclerotic heart disease of native coronary artery without angina pectoris: Secondary | ICD-10-CM | POA: Diagnosis not present

## 2016-07-02 DIAGNOSIS — Z7901 Long term (current) use of anticoagulants: Secondary | ICD-10-CM | POA: Diagnosis not present

## 2016-07-02 DIAGNOSIS — S81811D Laceration without foreign body, right lower leg, subsequent encounter: Secondary | ICD-10-CM | POA: Diagnosis not present

## 2016-07-02 DIAGNOSIS — I4891 Unspecified atrial fibrillation: Secondary | ICD-10-CM | POA: Diagnosis not present

## 2016-07-03 ENCOUNTER — Ambulatory Visit (INDEPENDENT_AMBULATORY_CARE_PROVIDER_SITE_OTHER): Payer: Medicare Other | Admitting: *Deleted

## 2016-07-03 DIAGNOSIS — I4891 Unspecified atrial fibrillation: Secondary | ICD-10-CM | POA: Diagnosis not present

## 2016-07-03 DIAGNOSIS — Z5181 Encounter for therapeutic drug level monitoring: Secondary | ICD-10-CM | POA: Diagnosis not present

## 2016-07-03 LAB — POCT INR: INR: 1.8

## 2016-07-04 DIAGNOSIS — I2510 Atherosclerotic heart disease of native coronary artery without angina pectoris: Secondary | ICD-10-CM | POA: Diagnosis not present

## 2016-07-04 DIAGNOSIS — I872 Venous insufficiency (chronic) (peripheral): Secondary | ICD-10-CM | POA: Diagnosis not present

## 2016-07-04 DIAGNOSIS — S81811D Laceration without foreign body, right lower leg, subsequent encounter: Secondary | ICD-10-CM | POA: Diagnosis not present

## 2016-07-04 DIAGNOSIS — I4891 Unspecified atrial fibrillation: Secondary | ICD-10-CM | POA: Diagnosis not present

## 2016-07-04 DIAGNOSIS — Z7901 Long term (current) use of anticoagulants: Secondary | ICD-10-CM | POA: Diagnosis not present

## 2016-07-05 DIAGNOSIS — I739 Peripheral vascular disease, unspecified: Secondary | ICD-10-CM | POA: Diagnosis not present

## 2016-07-05 DIAGNOSIS — I10 Essential (primary) hypertension: Secondary | ICD-10-CM | POA: Diagnosis not present

## 2016-07-05 DIAGNOSIS — S81811A Laceration without foreign body, right lower leg, initial encounter: Secondary | ICD-10-CM | POA: Diagnosis not present

## 2016-07-05 DIAGNOSIS — L03115 Cellulitis of right lower limb: Secondary | ICD-10-CM | POA: Diagnosis not present

## 2016-07-05 DIAGNOSIS — S81801A Unspecified open wound, right lower leg, initial encounter: Secondary | ICD-10-CM | POA: Diagnosis not present

## 2016-07-05 DIAGNOSIS — I2510 Atherosclerotic heart disease of native coronary artery without angina pectoris: Secondary | ICD-10-CM | POA: Diagnosis not present

## 2016-07-05 DIAGNOSIS — D649 Anemia, unspecified: Secondary | ICD-10-CM | POA: Diagnosis not present

## 2016-07-09 DIAGNOSIS — S81811D Laceration without foreign body, right lower leg, subsequent encounter: Secondary | ICD-10-CM | POA: Diagnosis not present

## 2016-07-09 DIAGNOSIS — I4891 Unspecified atrial fibrillation: Secondary | ICD-10-CM | POA: Diagnosis not present

## 2016-07-09 DIAGNOSIS — I2510 Atherosclerotic heart disease of native coronary artery without angina pectoris: Secondary | ICD-10-CM | POA: Diagnosis not present

## 2016-07-09 DIAGNOSIS — I872 Venous insufficiency (chronic) (peripheral): Secondary | ICD-10-CM | POA: Diagnosis not present

## 2016-07-09 DIAGNOSIS — Z7901 Long term (current) use of anticoagulants: Secondary | ICD-10-CM | POA: Diagnosis not present

## 2016-07-11 DIAGNOSIS — S81811D Laceration without foreign body, right lower leg, subsequent encounter: Secondary | ICD-10-CM | POA: Diagnosis not present

## 2016-07-11 DIAGNOSIS — I872 Venous insufficiency (chronic) (peripheral): Secondary | ICD-10-CM | POA: Diagnosis not present

## 2016-07-11 DIAGNOSIS — Z7901 Long term (current) use of anticoagulants: Secondary | ICD-10-CM | POA: Diagnosis not present

## 2016-07-11 DIAGNOSIS — I4891 Unspecified atrial fibrillation: Secondary | ICD-10-CM | POA: Diagnosis not present

## 2016-07-11 DIAGNOSIS — I2510 Atherosclerotic heart disease of native coronary artery without angina pectoris: Secondary | ICD-10-CM | POA: Diagnosis not present

## 2016-07-12 DIAGNOSIS — S81811A Laceration without foreign body, right lower leg, initial encounter: Secondary | ICD-10-CM | POA: Diagnosis not present

## 2016-07-12 DIAGNOSIS — L03115 Cellulitis of right lower limb: Secondary | ICD-10-CM | POA: Diagnosis not present

## 2016-07-12 DIAGNOSIS — I739 Peripheral vascular disease, unspecified: Secondary | ICD-10-CM | POA: Diagnosis not present

## 2016-07-12 DIAGNOSIS — I2510 Atherosclerotic heart disease of native coronary artery without angina pectoris: Secondary | ICD-10-CM | POA: Diagnosis not present

## 2016-07-12 DIAGNOSIS — D649 Anemia, unspecified: Secondary | ICD-10-CM | POA: Diagnosis not present

## 2016-07-12 DIAGNOSIS — I10 Essential (primary) hypertension: Secondary | ICD-10-CM | POA: Diagnosis not present

## 2016-07-16 DIAGNOSIS — I872 Venous insufficiency (chronic) (peripheral): Secondary | ICD-10-CM | POA: Diagnosis not present

## 2016-07-16 DIAGNOSIS — I4891 Unspecified atrial fibrillation: Secondary | ICD-10-CM | POA: Diagnosis not present

## 2016-07-16 DIAGNOSIS — I2510 Atherosclerotic heart disease of native coronary artery without angina pectoris: Secondary | ICD-10-CM | POA: Diagnosis not present

## 2016-07-16 DIAGNOSIS — Z7901 Long term (current) use of anticoagulants: Secondary | ICD-10-CM | POA: Diagnosis not present

## 2016-07-16 DIAGNOSIS — S81811D Laceration without foreign body, right lower leg, subsequent encounter: Secondary | ICD-10-CM | POA: Diagnosis not present

## 2016-07-18 DIAGNOSIS — I872 Venous insufficiency (chronic) (peripheral): Secondary | ICD-10-CM | POA: Diagnosis not present

## 2016-07-18 DIAGNOSIS — I4891 Unspecified atrial fibrillation: Secondary | ICD-10-CM | POA: Diagnosis not present

## 2016-07-18 DIAGNOSIS — Z7901 Long term (current) use of anticoagulants: Secondary | ICD-10-CM | POA: Diagnosis not present

## 2016-07-18 DIAGNOSIS — I2510 Atherosclerotic heart disease of native coronary artery without angina pectoris: Secondary | ICD-10-CM | POA: Diagnosis not present

## 2016-07-18 DIAGNOSIS — S81811D Laceration without foreign body, right lower leg, subsequent encounter: Secondary | ICD-10-CM | POA: Diagnosis not present

## 2016-07-19 ENCOUNTER — Encounter (HOSPITAL_BASED_OUTPATIENT_CLINIC_OR_DEPARTMENT_OTHER): Payer: Self-pay

## 2016-07-19 ENCOUNTER — Encounter (HOSPITAL_BASED_OUTPATIENT_CLINIC_OR_DEPARTMENT_OTHER): Payer: Medicare Other | Attending: Internal Medicine

## 2016-07-19 DIAGNOSIS — L97812 Non-pressure chronic ulcer of other part of right lower leg with fat layer exposed: Secondary | ICD-10-CM | POA: Diagnosis not present

## 2016-07-19 DIAGNOSIS — I87331 Chronic venous hypertension (idiopathic) with ulcer and inflammation of right lower extremity: Secondary | ICD-10-CM | POA: Diagnosis not present

## 2016-07-19 DIAGNOSIS — I739 Peripheral vascular disease, unspecified: Secondary | ICD-10-CM | POA: Insufficient documentation

## 2016-07-19 DIAGNOSIS — S81811A Laceration without foreign body, right lower leg, initial encounter: Secondary | ICD-10-CM | POA: Diagnosis not present

## 2016-07-19 DIAGNOSIS — I2510 Atherosclerotic heart disease of native coronary artery without angina pectoris: Secondary | ICD-10-CM | POA: Diagnosis not present

## 2016-07-19 DIAGNOSIS — I10 Essential (primary) hypertension: Secondary | ICD-10-CM | POA: Insufficient documentation

## 2016-07-19 DIAGNOSIS — L03115 Cellulitis of right lower limb: Secondary | ICD-10-CM | POA: Diagnosis not present

## 2016-07-23 DIAGNOSIS — I4891 Unspecified atrial fibrillation: Secondary | ICD-10-CM | POA: Diagnosis not present

## 2016-07-23 DIAGNOSIS — I2510 Atherosclerotic heart disease of native coronary artery without angina pectoris: Secondary | ICD-10-CM | POA: Diagnosis not present

## 2016-07-23 DIAGNOSIS — I872 Venous insufficiency (chronic) (peripheral): Secondary | ICD-10-CM | POA: Diagnosis not present

## 2016-07-23 DIAGNOSIS — Z7901 Long term (current) use of anticoagulants: Secondary | ICD-10-CM | POA: Diagnosis not present

## 2016-07-23 DIAGNOSIS — S81811D Laceration without foreign body, right lower leg, subsequent encounter: Secondary | ICD-10-CM | POA: Diagnosis not present

## 2016-07-25 DIAGNOSIS — Z7901 Long term (current) use of anticoagulants: Secondary | ICD-10-CM | POA: Diagnosis not present

## 2016-07-25 DIAGNOSIS — I4891 Unspecified atrial fibrillation: Secondary | ICD-10-CM | POA: Diagnosis not present

## 2016-07-25 DIAGNOSIS — S81811D Laceration without foreign body, right lower leg, subsequent encounter: Secondary | ICD-10-CM | POA: Diagnosis not present

## 2016-07-25 DIAGNOSIS — I2510 Atherosclerotic heart disease of native coronary artery without angina pectoris: Secondary | ICD-10-CM | POA: Diagnosis not present

## 2016-07-25 DIAGNOSIS — I872 Venous insufficiency (chronic) (peripheral): Secondary | ICD-10-CM | POA: Diagnosis not present

## 2016-07-27 DIAGNOSIS — I10 Essential (primary) hypertension: Secondary | ICD-10-CM | POA: Diagnosis not present

## 2016-07-27 DIAGNOSIS — S8011XA Contusion of right lower leg, initial encounter: Secondary | ICD-10-CM | POA: Diagnosis not present

## 2016-07-27 DIAGNOSIS — L03115 Cellulitis of right lower limb: Secondary | ICD-10-CM | POA: Diagnosis not present

## 2016-07-27 DIAGNOSIS — I87331 Chronic venous hypertension (idiopathic) with ulcer and inflammation of right lower extremity: Secondary | ICD-10-CM | POA: Diagnosis not present

## 2016-07-27 DIAGNOSIS — I2510 Atherosclerotic heart disease of native coronary artery without angina pectoris: Secondary | ICD-10-CM | POA: Diagnosis not present

## 2016-07-27 DIAGNOSIS — L97812 Non-pressure chronic ulcer of other part of right lower leg with fat layer exposed: Secondary | ICD-10-CM | POA: Diagnosis not present

## 2016-07-27 DIAGNOSIS — I739 Peripheral vascular disease, unspecified: Secondary | ICD-10-CM | POA: Diagnosis not present

## 2016-07-30 DIAGNOSIS — I872 Venous insufficiency (chronic) (peripheral): Secondary | ICD-10-CM | POA: Diagnosis not present

## 2016-07-30 DIAGNOSIS — I4891 Unspecified atrial fibrillation: Secondary | ICD-10-CM | POA: Diagnosis not present

## 2016-07-30 DIAGNOSIS — S81811D Laceration without foreign body, right lower leg, subsequent encounter: Secondary | ICD-10-CM | POA: Diagnosis not present

## 2016-07-30 DIAGNOSIS — Z7901 Long term (current) use of anticoagulants: Secondary | ICD-10-CM | POA: Diagnosis not present

## 2016-07-30 DIAGNOSIS — I2510 Atherosclerotic heart disease of native coronary artery without angina pectoris: Secondary | ICD-10-CM | POA: Diagnosis not present

## 2016-07-31 ENCOUNTER — Ambulatory Visit (INDEPENDENT_AMBULATORY_CARE_PROVIDER_SITE_OTHER): Payer: Medicare Other | Admitting: *Deleted

## 2016-07-31 DIAGNOSIS — I4891 Unspecified atrial fibrillation: Secondary | ICD-10-CM | POA: Diagnosis not present

## 2016-07-31 DIAGNOSIS — Z5181 Encounter for therapeutic drug level monitoring: Secondary | ICD-10-CM

## 2016-07-31 LAB — POCT INR: INR: 2.5

## 2016-08-01 ENCOUNTER — Ambulatory Visit (INDEPENDENT_AMBULATORY_CARE_PROVIDER_SITE_OTHER): Payer: Medicare Other | Admitting: Family

## 2016-08-01 ENCOUNTER — Encounter: Payer: Self-pay | Admitting: Family

## 2016-08-01 DIAGNOSIS — R0989 Other specified symptoms and signs involving the circulatory and respiratory systems: Secondary | ICD-10-CM

## 2016-08-01 DIAGNOSIS — I872 Venous insufficiency (chronic) (peripheral): Secondary | ICD-10-CM | POA: Diagnosis not present

## 2016-08-01 DIAGNOSIS — Z23 Encounter for immunization: Secondary | ICD-10-CM | POA: Diagnosis not present

## 2016-08-01 DIAGNOSIS — I495 Sick sinus syndrome: Secondary | ICD-10-CM | POA: Diagnosis not present

## 2016-08-01 DIAGNOSIS — S81811D Laceration without foreign body, right lower leg, subsequent encounter: Secondary | ICD-10-CM | POA: Diagnosis not present

## 2016-08-01 DIAGNOSIS — Z950 Presence of cardiac pacemaker: Secondary | ICD-10-CM | POA: Diagnosis not present

## 2016-08-01 DIAGNOSIS — Z7901 Long term (current) use of anticoagulants: Secondary | ICD-10-CM | POA: Diagnosis not present

## 2016-08-01 DIAGNOSIS — I2510 Atherosclerotic heart disease of native coronary artery without angina pectoris: Secondary | ICD-10-CM | POA: Diagnosis not present

## 2016-08-01 DIAGNOSIS — F458 Other somatoform disorders: Secondary | ICD-10-CM

## 2016-08-01 DIAGNOSIS — I4891 Unspecified atrial fibrillation: Secondary | ICD-10-CM | POA: Diagnosis not present

## 2016-08-01 NOTE — Assessment & Plan Note (Signed)
Question globus sensation, esophageal stricture, or dysphagia resulting in difficulty swallowing. No evidence of aspiration per patient history although cannot rule out completely. Recommend crushing pills as able and continuing current diet. Referral to gastroenterology placed for further assessment and evaluation. Consider SLP evaluation for aspiration although appears to be esophageal related. Follow-up if symptoms worsen or do not improve.

## 2016-08-01 NOTE — Patient Instructions (Addendum)
Thank you for choosing [facility].  SUMMARY AND INSTRUCTIONS:  Crush your pills as needed.   They will call to schedule an appointment with gastroenterology.   Follow up:  If your symptoms worsen or fail to improve, please contact our office for further instruction, or in case of emergency go directly to the emergency room at the closest medical facility.    Dysphagia Swallowing problems (dysphagia) occur when solids and liquids seem to stick in your throat on the way down to your stomach, or the food takes longer to get to the stomach. Other symptoms include regurgitating food, noises coming from the throat, chest discomfort with swallowing, and a feeling of fullness or the feeling of something being stuck in your throat when swallowing. When blockage in your throat is complete, it may be associated with drooling. CAUSES  Problems with swallowing may occur because of problems with the muscles. The food cannot be propelled in the usual manner into your stomach. You may have ulcers, scar tissue, or inflammation in the tube down which food travels from your mouth to your stomach (esophagus), which blocks food from passing normally into the stomach. Causes of inflammation include:  Acid reflux from your stomach into your esophagus.  Infection.  Radiation treatment for cancer.  Medicines taken without enough fluids to wash them down into your stomach. You may have nerve problems that prevent signals from being sent to the muscles of your esophagus to contract and move your food down to your stomach. Globus pharyngeus is a relatively common problem in which there is a sense of an obstruction or difficulty in swallowing, without any physical abnormalities of the swallowing passages being found. This problem usually improves over time with reassurance and testing to rule out other causes. DIAGNOSIS Dysphagia can be diagnosed and its cause can be determined by tests in which you swallow a  white substance that helps illuminate the inside of your throat (contrast medium) while X-rays are taken. Sometimes a flexible telescope that is inserted down your throat (endoscopy) to look at your esophagus and stomach is used. TREATMENT   If the dysphagia is caused by acid reflux or infection, medicines may be used.  If the dysphagia is caused by problems with your swallowing muscles, swallowing therapy may be used to help you strengthen your swallowing muscles.  If the dysphagia is caused by a blockage or mass, procedures to remove the blockage may be done. HOME CARE INSTRUCTIONS  Try to eat soft food that is easier to swallow and check your weight on a daily basis to be sure that it is not decreasing.  Be sure to drink liquids when sitting upright (not lying down). SEEK MEDICAL CARE IF:  You are losing weight because you are unable to swallow.  You are coughing when you drink liquids (aspiration).  You are coughing up partially digested food. SEEK IMMEDIATE MEDICAL CARE IF:  You are unable to swallow your own saliva .  You are having shortness of breath or a fever, or both.  You have a hoarse voice along with difficulty swallowing. MAKE SURE YOU:  Understand these instructions.  Will watch your condition.  Will get help right away if you are not doing well or get worse.   This information is not intended to replace advice given to you by your health care provider. Make sure you discuss any questions you have with your health care provider.   Document Released: 10/26/2000 Document Revised: 11/19/2014 Document Reviewed: 04/17/2013 Elsevier Interactive  Patient Education 2016 [employer].

## 2016-08-01 NOTE — Progress Notes (Signed)
Subjective:    Patient ID: [name], female    DOB: 1919-04-02, 82 y.o.   MRN: [id number]  Chief Complaint  Patient presents with  . Follow-up    was seen in the past for difficulty swallowing, still having issues, feeling full easily, feels like something is stuck on the right side of throat    HPI:  [name] is a 82 y.o. female who  has a past medical history of Anemia; AV block; CAD (coronary artery disease); Chronic pain syndrome; Diverticulosis of colon (without mention of hemorrhage); DJD (degenerative joint disease); LBP (low back pain); Osteoporosis; Ostium secundum type atrial septal defect; Overactive bladder; Pure hypercholesterolemia; and Venous insufficiency. and presents today for an office visit.   Globulus sensation - Patient with previous difficulty swallowing and continues to have issues with dysphagia and feelings of something stuck in her throat on the right side. Notes that she also has sense of fullness. There is some difficulty swallowing her pills and occasional food. There is some coughing as well. No coughing with food or liquids. Denies any abdominal pain, constipation, diarrhea, or nausea or vomiting. No fevers.    Allergies  Allergen Reactions  . Amiodarone Hcl     REACTION: INTOL to Amiodarone in past  . Codeine Nausea And Vomiting  . Fentanyl     REACTION: causes nausea--dizziness  . Lamisil [Terbinafine Hcl]     Causes nausea      Outpatient Medications Prior to Visit  Medication Sig Dispense Refill  . acetaminophen (TYLENOL) 500 MG tablet Take 650 mg by mouth every 8 (eight) hours as needed. Take with tramadol as needed for pain    . amLODipine (NORVASC) 5 MG tablet Take 1 tablet (5 mg total) by mouth daily. 30 tablet 11  . Ascorbic Acid (VITAMIN C) 1000 MG tablet Take 1,000 mg by mouth daily.      . Biotin 1000 MCG tablet Take 1,000 mcg by mouth daily.      . Calcium Carbonate-Vitamin D (CALCIUM 600+D) 600-400 MG-UNIT per tablet Take  2 tablets by mouth daily.      . Cholecalciferol (VITAMIN D) 1000 UNITS capsule Take 1,000 Units by mouth daily.      . ferrous sulfate 325 (65 FE) MG tablet Take 325 mg by mouth daily with breakfast.     . fluticasone (FLONASE) 50 MCG/ACT nasal spray Place 2 sprays into the nose daily as needed for allergies.     . furosemide (LASIX) 40 MG tablet TAKE 1 TABLET EVERY OTHER DAY ALTERNATING WITH 2 TABLETS EVERY OTHER DAY 135 tablet 2  . Glucosamine 500 MG CAPS Take 500 mg by mouth daily.     Marland Kitchen guaiFENesin (MUCINEX) 600 MG 12 hr tablet Take 1 tablet (600 mg total) by mouth 2 (two) times daily. 30 tablet 0  . Multiple Vitamins-Minerals (PRESERVISION AREDS) TABS Take 1 tablet by mouth daily.    . Omega-3 350 MG CAPS Take 1 capsule by mouth every evening.     Marland Kitchen omeprazole (PRILOSEC) 20 MG capsule Take 20 mg by mouth daily as needed (acid reflux).     . potassium chloride (K-DUR) 10 MEQ tablet Take 10 mEq by mouth 2 (two) times daily. And as needed when taking furosemide.    . tolterodine (DETROL) 2 MG tablet Take 2 mg by mouth 2 (two) times daily.    . traMADol (ULTRAM) 50 MG tablet Take 1 tablet (50 mg total) by mouth every 6 (six) hours as  needed for moderate pain. 30 tablet 0  . vitamin A 8000 UNIT capsule Take 8,000 Units by mouth every other day.      . vitamin E 400 UNIT capsule Take 400 Units by mouth daily.      Marland Kitchen warfarin (COUMADIN) 5 MG tablet TAKE AS DIRECTED BY COUMADIN CLINIC 90 tablet 1  . cefUROXime (CEFTIN) 500 MG tablet Take 1 tablet (500 mg total) by mouth 2 (two) times daily with a meal. 14 tablet 0   No facility-administered medications prior to visit.      Review of Systems  Constitutional: Negative for chills and fever.  HENT: Positive for trouble swallowing. Negative for congestion and voice change.   Respiratory: Negative for cough, choking, chest tightness, shortness of breath and wheezing.   Cardiovascular: Negative for chest pain.  Gastrointestinal: Negative for  abdominal pain, blood in stool, constipation, diarrhea, nausea and vomiting.      Objective:    BP (!) 102/54 (BP Location: Left Arm, Patient Position: Sitting, Cuff Size: Normal)   Pulse 97   Temp 97.9 F (36.6 C) (Oral)   Resp 14   Ht 4\' 11"  (1.499 m)   Wt 89 lb (40.4 kg)   LMP  (LMP Unknown)   SpO2 93%   BMI 17.98 kg/m  Nursing note and vital signs reviewed.  Physical Exam  Constitutional: She is oriented to person, place, and time. She appears well-developed and well-nourished. No distress.  HENT:  Mouth/Throat: Uvula is midline, oropharynx is clear and moist and mucous membranes are normal.  Cardiovascular: Normal rate, regular rhythm, normal heart sounds and intact distal pulses.   Pulmonary/Chest: Effort normal and breath sounds normal. No respiratory distress. She has no wheezes. She has no rales. She exhibits no tenderness.  Neurological: She is alert and oriented to person, place, and time.  Skin: Skin is warm and dry.  Psychiatric: She has a normal mood and affect. Her behavior is normal. Judgment and thought content normal.       Assessment & Plan:   Problem List Items Addressed This Visit      Other   Globus sensation    Question globus sensation, esophageal stricture, or dysphagia resulting in difficulty swallowing. No evidence of aspiration per patient history although cannot rule out completely. Recommend crushing pills as able and continuing current diet. Referral to gastroenterology placed for further assessment and evaluation. Consider SLP evaluation for aspiration although appears to be esophageal related. Follow-up if symptoms worsen or do not improve.      Relevant Orders   Ambulatory referral to Gastroenterology    Other Visit Diagnoses    Encounter for immunization       Relevant Orders   Flu vaccine HIGH DOSE PF (Completed)       I have discontinued Ms. Littles's cefUROXime. I am also having her maintain her Biotin, Calcium Carbonate-Vitamin  D, ferrous sulfate, Glucosamine, Omega-3, acetaminophen, vitamin A, vitamin C, Vitamin D, vitamin E, omeprazole, fluticasone, traMADol, PRESERVISION AREDS, tolterodine, guaiFENesin, potassium chloride, furosemide, amLODipine, and warfarin.   Follow-up: Return if symptoms worsen or fail to improve.  Mauricio Po, FNP

## 2016-08-02 DIAGNOSIS — Z7901 Long term (current) use of anticoagulants: Secondary | ICD-10-CM | POA: Diagnosis not present

## 2016-08-02 DIAGNOSIS — I2510 Atherosclerotic heart disease of native coronary artery without angina pectoris: Secondary | ICD-10-CM | POA: Diagnosis not present

## 2016-08-02 DIAGNOSIS — I4891 Unspecified atrial fibrillation: Secondary | ICD-10-CM | POA: Diagnosis not present

## 2016-08-02 DIAGNOSIS — S81811D Laceration without foreign body, right lower leg, subsequent encounter: Secondary | ICD-10-CM | POA: Diagnosis not present

## 2016-08-02 DIAGNOSIS — I872 Venous insufficiency (chronic) (peripheral): Secondary | ICD-10-CM | POA: Diagnosis not present

## 2016-08-03 DIAGNOSIS — L03115 Cellulitis of right lower limb: Secondary | ICD-10-CM | POA: Diagnosis not present

## 2016-08-03 DIAGNOSIS — I739 Peripheral vascular disease, unspecified: Secondary | ICD-10-CM | POA: Diagnosis not present

## 2016-08-03 DIAGNOSIS — I10 Essential (primary) hypertension: Secondary | ICD-10-CM | POA: Diagnosis not present

## 2016-08-03 DIAGNOSIS — S81811A Laceration without foreign body, right lower leg, initial encounter: Secondary | ICD-10-CM | POA: Diagnosis not present

## 2016-08-03 DIAGNOSIS — L97812 Non-pressure chronic ulcer of other part of right lower leg with fat layer exposed: Secondary | ICD-10-CM | POA: Diagnosis not present

## 2016-08-03 DIAGNOSIS — I87331 Chronic venous hypertension (idiopathic) with ulcer and inflammation of right lower extremity: Secondary | ICD-10-CM | POA: Diagnosis not present

## 2016-08-03 DIAGNOSIS — I2510 Atherosclerotic heart disease of native coronary artery without angina pectoris: Secondary | ICD-10-CM | POA: Diagnosis not present

## 2016-08-07 DIAGNOSIS — I872 Venous insufficiency (chronic) (peripheral): Secondary | ICD-10-CM | POA: Diagnosis not present

## 2016-08-07 DIAGNOSIS — S81811D Laceration without foreign body, right lower leg, subsequent encounter: Secondary | ICD-10-CM | POA: Diagnosis not present

## 2016-08-07 DIAGNOSIS — Z7901 Long term (current) use of anticoagulants: Secondary | ICD-10-CM | POA: Diagnosis not present

## 2016-08-07 DIAGNOSIS — I2510 Atherosclerotic heart disease of native coronary artery without angina pectoris: Secondary | ICD-10-CM | POA: Diagnosis not present

## 2016-08-07 DIAGNOSIS — I4891 Unspecified atrial fibrillation: Secondary | ICD-10-CM | POA: Diagnosis not present

## 2016-08-08 DIAGNOSIS — I872 Venous insufficiency (chronic) (peripheral): Secondary | ICD-10-CM | POA: Diagnosis not present

## 2016-08-08 DIAGNOSIS — S81811D Laceration without foreign body, right lower leg, subsequent encounter: Secondary | ICD-10-CM | POA: Diagnosis not present

## 2016-08-08 DIAGNOSIS — Z7901 Long term (current) use of anticoagulants: Secondary | ICD-10-CM | POA: Diagnosis not present

## 2016-08-08 DIAGNOSIS — I4891 Unspecified atrial fibrillation: Secondary | ICD-10-CM | POA: Diagnosis not present

## 2016-08-08 DIAGNOSIS — I2510 Atherosclerotic heart disease of native coronary artery without angina pectoris: Secondary | ICD-10-CM | POA: Diagnosis not present

## 2016-08-10 ENCOUNTER — Telehealth: Payer: Self-pay | Admitting: Internal Medicine

## 2016-08-10 DIAGNOSIS — L03115 Cellulitis of right lower limb: Secondary | ICD-10-CM | POA: Diagnosis not present

## 2016-08-10 DIAGNOSIS — L97812 Non-pressure chronic ulcer of other part of right lower leg with fat layer exposed: Secondary | ICD-10-CM | POA: Diagnosis not present

## 2016-08-10 DIAGNOSIS — I87331 Chronic venous hypertension (idiopathic) with ulcer and inflammation of right lower extremity: Secondary | ICD-10-CM | POA: Diagnosis not present

## 2016-08-10 DIAGNOSIS — S81811A Laceration without foreign body, right lower leg, initial encounter: Secondary | ICD-10-CM | POA: Diagnosis not present

## 2016-08-10 DIAGNOSIS — I872 Venous insufficiency (chronic) (peripheral): Secondary | ICD-10-CM | POA: Diagnosis not present

## 2016-08-10 DIAGNOSIS — I2510 Atherosclerotic heart disease of native coronary artery without angina pectoris: Secondary | ICD-10-CM | POA: Diagnosis not present

## 2016-08-10 DIAGNOSIS — I10 Essential (primary) hypertension: Secondary | ICD-10-CM | POA: Diagnosis not present

## 2016-08-10 DIAGNOSIS — I739 Peripheral vascular disease, unspecified: Secondary | ICD-10-CM | POA: Diagnosis not present

## 2016-08-10 MED ORDER — POTASSIUM CHLORIDE 20 MEQ/15ML (10%) PO SOLN: 10.0000 meq | Freq: Two times a day (BID) | ORAL | 6 refills | Status: DC

## 2016-08-10 NOTE — Telephone Encounter (Signed)
I called and spoke with the patient's daughter. She is having difficulty swallowing as of late. She is going to have this followed up on next week. I advised we will send in liquid potassium for the patient.

## 2016-08-10 NOTE — Telephone Encounter (Signed)
New Message  Pt c/o medication issue:  1. Name of Medication:  Potassium Chloride(K-Dur)  2. How are you currently taking this medication (dosage and times per day)? 10 mEq by mouth twice daily  3. Are you having a reaction (difficulty breathing--STAT)? No  4. What is your medication issue? Pts daughter voiced pt is having trouble swallowing pill and wanting to know if she can receive it in liquid form.  Pts daughter also voiced if we can get it in liquid form to see if we can send it to [facility] on [address], if not [facility].  Pts daughter voiced they've tried mixing the pill in with food/applesauce and it does not help.  Please f/u

## 2016-08-13 DIAGNOSIS — I4891 Unspecified atrial fibrillation: Secondary | ICD-10-CM | POA: Diagnosis not present

## 2016-08-13 DIAGNOSIS — I2510 Atherosclerotic heart disease of native coronary artery without angina pectoris: Secondary | ICD-10-CM | POA: Diagnosis not present

## 2016-08-13 DIAGNOSIS — Z7901 Long term (current) use of anticoagulants: Secondary | ICD-10-CM | POA: Diagnosis not present

## 2016-08-13 DIAGNOSIS — S81811D Laceration without foreign body, right lower leg, subsequent encounter: Secondary | ICD-10-CM | POA: Diagnosis not present

## 2016-08-13 DIAGNOSIS — I872 Venous insufficiency (chronic) (peripheral): Secondary | ICD-10-CM | POA: Diagnosis not present

## 2016-08-15 ENCOUNTER — Ambulatory Visit (INDEPENDENT_AMBULATORY_CARE_PROVIDER_SITE_OTHER): Payer: Medicare Other | Admitting: Gastroenterology

## 2016-08-15 ENCOUNTER — Encounter: Payer: Self-pay | Admitting: Gastroenterology

## 2016-08-15 VITALS — BP 116/50 | HR 76 | Ht <= 58 in | Wt 87.4 lb

## 2016-08-15 DIAGNOSIS — F458 Other somatoform disorders: Secondary | ICD-10-CM | POA: Diagnosis not present

## 2016-08-15 DIAGNOSIS — Z7901 Long term (current) use of anticoagulants: Secondary | ICD-10-CM | POA: Diagnosis not present

## 2016-08-15 DIAGNOSIS — I872 Venous insufficiency (chronic) (peripheral): Secondary | ICD-10-CM | POA: Diagnosis not present

## 2016-08-15 DIAGNOSIS — S81811D Laceration without foreign body, right lower leg, subsequent encounter: Secondary | ICD-10-CM | POA: Diagnosis not present

## 2016-08-15 DIAGNOSIS — I2510 Atherosclerotic heart disease of native coronary artery without angina pectoris: Secondary | ICD-10-CM | POA: Diagnosis not present

## 2016-08-15 DIAGNOSIS — R0989 Other specified symptoms and signs involving the circulatory and respiratory systems: Secondary | ICD-10-CM

## 2016-08-15 DIAGNOSIS — R1310 Dysphagia, unspecified: Secondary | ICD-10-CM | POA: Diagnosis not present

## 2016-08-15 DIAGNOSIS — I4891 Unspecified atrial fibrillation: Secondary | ICD-10-CM | POA: Diagnosis not present

## 2016-08-15 NOTE — Progress Notes (Signed)
08/15/2016 [name] [id number] 27-Nov-1918   HISTORY OF PRESENT ILLNESS:  This is a pleasant 82 year old female who is previously known to Dr. Deatra Ina.  She presents to our office today at the request of her PCP, Terri Piedra, FNP, for evaluation of difficulty swallowing her pills. She is here today with her daughter. They tell me that recently she has had extreme issues with swallowing her pills; says that it feels like they are getting stuck in her throat. She has no problem eating food or having food get stuck.  Complains of excessive belching.  She says that when she leans her head forward she feels like she is unable to breathe. She also complains of coughing up large amounts of phlegm as well as feeling dizzy as if her head and her ears are all stopped up at times. She has seen an ENT physician in the past, probably 2 or 3 years ago for some other issues with hearing aids, etc. She is on Coumadin and had a "slight stroke" according to her daughter while she was off of it in the past so she must now bridged to Lovenox.  She gets herself worked up and panicked when the issue with swallowing her pills occurs.   Past Medical History:  Diagnosis Date  . Anemia   . AV block    s/p PPM  . CAD (coronary artery disease)   . Chronic pain syndrome   . Diverticulosis of colon (without mention of hemorrhage)   . DJD (degenerative joint disease)   . LBP (low back pain)   . Osteoporosis    pelvic fx 10/2013 s/p fall  . Ostium secundum type atrial septal defect   . Overactive bladder   . Pure hypercholesterolemia   . Venous insufficiency    Past Surgical History:  Procedure Laterality Date  . ASD REPAIR    . CATARACT EXTRACTION    . GANGLION CYST EXCISION    . INGUINAL HERNIA REPAIR  2011   bilaterally  . PACEMAKER PLACEMENT  1996   [employer]  . PARTIAL HYSTERECTOMY    . SHOULDER SURGERY     x 2  . VESICOVAGINAL FISTULA CLOSURE W/ TAH  1973    reports that she has never smoked. She  has never used smokeless tobacco. She reports that she does not drink alcohol or use drugs. family history includes Breast cancer in her sister; Emphysema in her father; Heart disease in her mother; Heart failure in her mother. Allergies  Allergen Reactions  . Amiodarone Hcl     REACTION: INTOL to Amiodarone in past  . Codeine Nausea And Vomiting  . Fentanyl     REACTION: causes nausea--dizziness  . Lamisil [Terbinafine Hcl]     Causes nausea      Outpatient Encounter Prescriptions as of 08/15/2016  Medication Sig  . acetaminophen (TYLENOL) 500 MG tablet Take 650 mg by mouth every 8 (eight) hours as needed. Take with tramadol as needed for pain  . amLODipine (NORVASC) 5 MG tablet Take 1 tablet (5 mg total) by mouth daily.  . Ascorbic Acid (VITAMIN C) 1000 MG tablet Take 1,000 mg by mouth daily.    . Biotin 1000 MCG tablet Take 1,000 mcg by mouth daily.    . Calcium Carbonate-Vitamin D (CALCIUM 600+D) 600-400 MG-UNIT per tablet Take 2 tablets by mouth daily.    . Cholecalciferol (VITAMIN D) 1000 UNITS capsule Take 1,000 Units by mouth daily.    . ferrous sulfate  325 (65 FE) MG tablet Take 325 mg by mouth daily with breakfast.   . furosemide (LASIX) 40 MG tablet TAKE 1 TABLET EVERY OTHER DAY ALTERNATING WITH 2 TABLETS EVERY OTHER DAY  . guaiFENesin (MUCINEX) 600 MG 12 hr tablet Take 1 tablet (600 mg total) by mouth 2 (two) times daily. (Patient taking differently: Take 600 mg by mouth as needed. )  . Omega-3 350 MG CAPS Take 1 capsule by mouth every evening.   Marland Kitchen omeprazole (PRILOSEC) 20 MG capsule Take 20 mg by mouth daily as needed (acid reflux).   . potassium chloride 20 MEQ/15ML (10%) SOLN Take 7.5 mLs (10 mEq total) by mouth 2 (two) times daily.  Marland Kitchen tolterodine (DETROL) 2 MG tablet Take 2 mg by mouth 2 (two) times daily.  . traMADol (ULTRAM) 50 MG tablet Take 1 tablet (50 mg total) by mouth every 6 (six) hours as needed for moderate pain.  . vitamin A 8000 UNIT capsule Take 8,000 Units  by mouth every other day.    . vitamin E 400 UNIT capsule Take 400 Units by mouth daily.    Marland Kitchen warfarin (COUMADIN) 5 MG tablet TAKE AS DIRECTED BY COUMADIN CLINIC  . Glucosamine 500 MG CAPS Take 500 mg by mouth daily.   . Multiple Vitamins-Minerals (PRESERVISION AREDS) TABS Take 1 tablet by mouth daily.  . [DISCONTINUED] fluticasone (FLONASE) 50 MCG/ACT nasal spray Place 2 sprays into the nose daily as needed for allergies.    No facility-administered encounter medications on file as of 08/15/2016.      REVIEW OF SYSTEMS  : All other systems reviewed and negative except where noted in the History of Present Illness.   PHYSICAL EXAM: BP (!) 116/50 (BP Location: Left Arm, Patient Position: Sitting, Cuff Size: Normal)   Pulse 76   Ht 4\' 8"  (1.422 m) Comment: height measured without shoes  Wt 87 lb 6 oz (39.6 kg)   LMP  (LMP Unknown)   BMI 19.59 kg/m  General:  Elderly white female in no acute distress Head: Normocephalic and atraumatic Eyes:  Sclerae anicteric, conjunctiva pink. Ears: Normal auditory acuity Lungs: Clear throughout to auscultation Heart: Regular rate and rhythm Abdomen: Soft, non-distended.  Normal bowels sounds.  Non-tender. Musculoskeletal: Symmetrical with no gross deformities  Skin: No lesions on visible extremities Extremities: No edema  Neurological: Alert oriented x 4, grossly non-focal Psychological:  Alert and cooperative. Normal mood and affect  ASSESSMENT AND PLAN: -82 year old female with complaints of dysphagia to pills only and globus sensation:  Sounds more like oropharyngeal dysphasia rather than esophageal. With her advanced age and her chronic anticoagulation use with need to bridge with Lovenox we would like to avoid all procedures if possible. We'll perform barium esophagram with tablet as well as a modified barium swallow study.  ? If she may need ENT referral.   CC:  Golden Circle, FNP

## 2016-08-15 NOTE — Patient Instructions (Signed)
You have been scheduled for a Barium Esophogram at [facility] (1st floor of the hospital) on 08/23/2016 at 11:30am. Please arrive 15 minutes prior to your appointment for registration. Make certain not to have anything to eat or drink 6 hours prior to your test. If you need to reschedule for any reason, please contact radiology at [phone] to do so. __________________________________________________________________ A barium swallow is an examination that concentrates on views of the esophagus. This tends to be a double contrast exam (barium and two liquids which, when combined, create a gas to distend the wall of the oesophagus) or single contrast (non-ionic iodine based). The study is usually tailored to your symptoms so a good history is essential. Attention is paid during the study to the form, structure and configuration of the esophagus, looking for functional disorders (such as aspiration, dysphagia, achalasia, motility and reflux) EXAMINATION You may be asked to change into a gown, depending on the type of swallow being performed. A radiologist and radiographer will perform the procedure. The radiologist will advise you of the type of contrast selected for your procedure and direct you during the exam. You will be asked to stand, sit or lie in several different positions and to hold a small amount of fluid in your mouth before being asked to swallow while the imaging is performed .In some instances you may be asked to swallow barium coated marshmallows to assess the motility of a solid food bolus. The exam can be recorded as a digital or video fluoroscopy procedure. POST PROCEDURE It will take 1-2 days for the barium to pass through your system. To facilitate this, it is important, unless otherwise directed, to increase your fluids for the next 24-48hrs and to resume your normal diet.  This test typically takes about 30 minutes to  perform. __________________________________________________________________________________   [name] have been scheduled for a modified barium swallow on 08/23/2016 at 11am. Please arrive 15 minutes prior to your test for registration. You will go to [facility] (1st Floor) for your appointment. Please refrain from eating or drinking anything 4 hours prior to your test. Should you need to cancel or reschedule your appointment, please contact [phone] [name]) or [phone] [name]). _____________________________________________________________________ A Modified Barium Swallow Study, or MBS, is a special x-ray that is taken to check swallowing skills. It is carried out by a Stage manager and a Psychologist, clinical (SLP). During this test, yourmouth, throat, and esophagus, a muscular tube which connects your mouth to your stomach, is checked. The test will help you, your doctor, and the SLP plan what types of foods and liquids are easier for you to swallow. The SLP will also identify positions and ways to help you swallow more easily and safely. What will happen during an MBS? You will be taken to an x-ray room and seated comfortably. You will be asked to swallow small amounts of food and liquid mixed with barium. Barium is a liquid or paste that allows images of your mouth, throat and esophagus to be seen on x-ray. The x-ray captures moving images of the food you are swallowing as it travels from your mouth through your throat and into your esophagus. This test helps identify whether food or liquid is entering your lungs (aspiration). The test also shows which part of your mouth or throat lacks strength or coordination to move the food or liquid in the right direction. This test typically takes 30 minutes to 1 hour to complete. _______________________________________________________________________

## 2016-08-16 ENCOUNTER — Other Ambulatory Visit (HOSPITAL_COMMUNITY): Payer: Self-pay | Admitting: Gastroenterology

## 2016-08-16 DIAGNOSIS — R1310 Dysphagia, unspecified: Secondary | ICD-10-CM

## 2016-08-16 NOTE — Progress Notes (Signed)
Thank you for sending this case to me. I have reviewed the entire note, and the outlined plan seems appropriate.  I agree that this sounds like transfer dysphagia, in which case EGD unlikely to be helpful.  MBS is the right test to do next.

## 2016-08-17 ENCOUNTER — Encounter (HOSPITAL_BASED_OUTPATIENT_CLINIC_OR_DEPARTMENT_OTHER): Payer: Medicare Other | Attending: Internal Medicine

## 2016-08-17 DIAGNOSIS — L97812 Non-pressure chronic ulcer of other part of right lower leg with fat layer exposed: Secondary | ICD-10-CM | POA: Diagnosis not present

## 2016-08-17 DIAGNOSIS — S81811A Laceration without foreign body, right lower leg, initial encounter: Secondary | ICD-10-CM | POA: Diagnosis not present

## 2016-08-17 DIAGNOSIS — I87331 Chronic venous hypertension (idiopathic) with ulcer and inflammation of right lower extremity: Secondary | ICD-10-CM | POA: Insufficient documentation

## 2016-08-17 DIAGNOSIS — I10 Essential (primary) hypertension: Secondary | ICD-10-CM | POA: Insufficient documentation

## 2016-08-23 ENCOUNTER — Ambulatory Visit (HOSPITAL_COMMUNITY)
Admission: RE | Admit: 2016-08-23 | Discharge: 2016-08-23 | Disposition: A | Discharge status: Home / Self Care | Payer: Medicare Other | Source: Ambulatory Visit | Attending: Gastroenterology | Admitting: Gastroenterology

## 2016-08-23 DIAGNOSIS — R1310 Dysphagia, unspecified: Secondary | ICD-10-CM | POA: Insufficient documentation

## 2016-08-23 DIAGNOSIS — R05 Cough: Secondary | ICD-10-CM | POA: Diagnosis not present

## 2016-08-24 DIAGNOSIS — L97812 Non-pressure chronic ulcer of other part of right lower leg with fat layer exposed: Secondary | ICD-10-CM | POA: Diagnosis not present

## 2016-08-24 DIAGNOSIS — I87331 Chronic venous hypertension (idiopathic) with ulcer and inflammation of right lower extremity: Secondary | ICD-10-CM | POA: Diagnosis not present

## 2016-08-24 DIAGNOSIS — I10 Essential (primary) hypertension: Secondary | ICD-10-CM | POA: Diagnosis not present

## 2016-08-28 ENCOUNTER — Ambulatory Visit (INDEPENDENT_AMBULATORY_CARE_PROVIDER_SITE_OTHER): Payer: Medicare Other | Admitting: *Deleted

## 2016-08-28 DIAGNOSIS — I4891 Unspecified atrial fibrillation: Secondary | ICD-10-CM

## 2016-08-28 DIAGNOSIS — Z5181 Encounter for therapeutic drug level monitoring: Secondary | ICD-10-CM

## 2016-08-28 LAB — POCT INR: INR: 2.3

## 2016-08-31 DIAGNOSIS — L03115 Cellulitis of right lower limb: Secondary | ICD-10-CM | POA: Diagnosis not present

## 2016-08-31 DIAGNOSIS — L97812 Non-pressure chronic ulcer of other part of right lower leg with fat layer exposed: Secondary | ICD-10-CM | POA: Diagnosis not present

## 2016-08-31 DIAGNOSIS — S8011XA Contusion of right lower leg, initial encounter: Secondary | ICD-10-CM | POA: Diagnosis not present

## 2016-08-31 DIAGNOSIS — I10 Essential (primary) hypertension: Secondary | ICD-10-CM | POA: Diagnosis not present

## 2016-08-31 DIAGNOSIS — I87311 Chronic venous hypertension (idiopathic) with ulcer of right lower extremity: Secondary | ICD-10-CM | POA: Diagnosis not present

## 2016-08-31 DIAGNOSIS — I87331 Chronic venous hypertension (idiopathic) with ulcer and inflammation of right lower extremity: Secondary | ICD-10-CM | POA: Diagnosis not present

## 2016-09-03 NOTE — Progress Notes (Signed)
   08/23/16 1400  SLP G-Codes **NOT FOR INPATIENT CLASS**  Functional Assessment Tool Used clinical judgement  Functional Limitations Swallowing  Swallow Current Status [id number]) CI  Swallow Goal Status [id number]) CI  Swallow Discharge Status [id number]) CI  SLP Evaluations  $ SLP Speech Visit 1 Procedure  SLP Evaluations  $Swallowing Treatment 1 Procedure  $MBS Swallow Outpatient 1 Procedure

## 2016-09-14 ENCOUNTER — Encounter (HOSPITAL_BASED_OUTPATIENT_CLINIC_OR_DEPARTMENT_OTHER): Payer: Medicare Other | Attending: Internal Medicine

## 2016-09-14 DIAGNOSIS — S81811A Laceration without foreign body, right lower leg, initial encounter: Secondary | ICD-10-CM | POA: Diagnosis not present

## 2016-09-14 DIAGNOSIS — W504XXA Accidental scratch by another person, initial encounter: Secondary | ICD-10-CM | POA: Insufficient documentation

## 2016-09-14 DIAGNOSIS — S80811A Abrasion, right lower leg, initial encounter: Secondary | ICD-10-CM | POA: Diagnosis not present

## 2016-09-14 DIAGNOSIS — I87331 Chronic venous hypertension (idiopathic) with ulcer and inflammation of right lower extremity: Secondary | ICD-10-CM | POA: Diagnosis not present

## 2016-09-25 ENCOUNTER — Ambulatory Visit (INDEPENDENT_AMBULATORY_CARE_PROVIDER_SITE_OTHER): Payer: Medicare Other | Admitting: *Deleted

## 2016-09-25 DIAGNOSIS — I4891 Unspecified atrial fibrillation: Secondary | ICD-10-CM

## 2016-09-25 DIAGNOSIS — Z5181 Encounter for therapeutic drug level monitoring: Secondary | ICD-10-CM

## 2016-09-25 LAB — POCT INR: INR: 3

## 2016-09-28 DIAGNOSIS — L97812 Non-pressure chronic ulcer of other part of right lower leg with fat layer exposed: Secondary | ICD-10-CM | POA: Diagnosis not present

## 2016-09-28 DIAGNOSIS — T8131XA Disruption of external operation (surgical) wound, not elsewhere classified, initial encounter: Secondary | ICD-10-CM | POA: Diagnosis not present

## 2016-09-28 DIAGNOSIS — S80811A Abrasion, right lower leg, initial encounter: Secondary | ICD-10-CM | POA: Diagnosis not present

## 2016-09-28 DIAGNOSIS — S81811A Laceration without foreign body, right lower leg, initial encounter: Secondary | ICD-10-CM | POA: Diagnosis not present

## 2016-10-09 ENCOUNTER — Ambulatory Visit (INDEPENDENT_AMBULATORY_CARE_PROVIDER_SITE_OTHER): Payer: Medicare Other

## 2016-10-09 DIAGNOSIS — I4891 Unspecified atrial fibrillation: Secondary | ICD-10-CM | POA: Diagnosis not present

## 2016-10-09 DIAGNOSIS — Z5181 Encounter for therapeutic drug level monitoring: Secondary | ICD-10-CM | POA: Diagnosis not present

## 2016-10-09 LAB — POCT INR: INR: 2.8

## 2016-10-12 ENCOUNTER — Encounter (HOSPITAL_BASED_OUTPATIENT_CLINIC_OR_DEPARTMENT_OTHER): Payer: Medicare Other | Attending: Internal Medicine

## 2016-10-12 DIAGNOSIS — I87331 Chronic venous hypertension (idiopathic) with ulcer and inflammation of right lower extremity: Secondary | ICD-10-CM | POA: Insufficient documentation

## 2016-10-12 DIAGNOSIS — L97211 Non-pressure chronic ulcer of right calf limited to breakdown of skin: Secondary | ICD-10-CM | POA: Insufficient documentation

## 2016-10-12 DIAGNOSIS — I2510 Atherosclerotic heart disease of native coronary artery without angina pectoris: Secondary | ICD-10-CM | POA: Insufficient documentation

## 2016-10-12 DIAGNOSIS — S8011XA Contusion of right lower leg, initial encounter: Secondary | ICD-10-CM | POA: Diagnosis not present

## 2016-10-12 DIAGNOSIS — I10 Essential (primary) hypertension: Secondary | ICD-10-CM | POA: Diagnosis not present

## 2016-10-12 DIAGNOSIS — S81801A Unspecified open wound, right lower leg, initial encounter: Secondary | ICD-10-CM | POA: Diagnosis not present

## 2016-10-19 DIAGNOSIS — S81801A Unspecified open wound, right lower leg, initial encounter: Secondary | ICD-10-CM | POA: Diagnosis not present

## 2016-10-19 DIAGNOSIS — R600 Localized edema: Secondary | ICD-10-CM | POA: Diagnosis not present

## 2016-10-19 DIAGNOSIS — L97211 Non-pressure chronic ulcer of right calf limited to breakdown of skin: Secondary | ICD-10-CM | POA: Diagnosis not present

## 2016-10-19 DIAGNOSIS — I2510 Atherosclerotic heart disease of native coronary artery without angina pectoris: Secondary | ICD-10-CM | POA: Diagnosis not present

## 2016-10-19 DIAGNOSIS — I10 Essential (primary) hypertension: Secondary | ICD-10-CM | POA: Diagnosis not present

## 2016-10-19 DIAGNOSIS — I87331 Chronic venous hypertension (idiopathic) with ulcer and inflammation of right lower extremity: Secondary | ICD-10-CM | POA: Diagnosis not present

## 2016-10-23 ENCOUNTER — Ambulatory Visit (INDEPENDENT_AMBULATORY_CARE_PROVIDER_SITE_OTHER): Payer: Medicare Other | Admitting: *Deleted

## 2016-10-23 DIAGNOSIS — Z5181 Encounter for therapeutic drug level monitoring: Secondary | ICD-10-CM

## 2016-10-23 DIAGNOSIS — I4891 Unspecified atrial fibrillation: Secondary | ICD-10-CM | POA: Diagnosis not present

## 2016-10-23 LAB — POCT INR: INR: 2.7

## 2016-10-31 ENCOUNTER — Encounter: Payer: Self-pay | Admitting: Internal Medicine

## 2016-10-31 DIAGNOSIS — I495 Sick sinus syndrome: Secondary | ICD-10-CM | POA: Diagnosis not present

## 2016-10-31 DIAGNOSIS — Z950 Presence of cardiac pacemaker: Secondary | ICD-10-CM | POA: Diagnosis not present

## 2016-11-08 ENCOUNTER — Ambulatory Visit (INDEPENDENT_AMBULATORY_CARE_PROVIDER_SITE_OTHER): Payer: Medicare Other | Admitting: *Deleted

## 2016-11-08 DIAGNOSIS — I4891 Unspecified atrial fibrillation: Secondary | ICD-10-CM | POA: Diagnosis not present

## 2016-11-08 DIAGNOSIS — Z5181 Encounter for therapeutic drug level monitoring: Secondary | ICD-10-CM | POA: Diagnosis not present

## 2016-11-08 LAB — POCT INR: INR: 1.7

## 2016-11-22 ENCOUNTER — Ambulatory Visit (INDEPENDENT_AMBULATORY_CARE_PROVIDER_SITE_OTHER): Payer: Medicare Other | Admitting: *Deleted

## 2016-11-22 DIAGNOSIS — I4891 Unspecified atrial fibrillation: Secondary | ICD-10-CM

## 2016-11-22 DIAGNOSIS — Z5181 Encounter for therapeutic drug level monitoring: Secondary | ICD-10-CM

## 2016-11-22 LAB — POCT INR: INR: 1.8

## 2016-11-23 ENCOUNTER — Encounter (HOSPITAL_BASED_OUTPATIENT_CLINIC_OR_DEPARTMENT_OTHER): Payer: Medicare Other | Attending: Internal Medicine

## 2016-11-23 DIAGNOSIS — I110 Hypertensive heart disease with heart failure: Secondary | ICD-10-CM | POA: Diagnosis not present

## 2016-11-23 DIAGNOSIS — I2510 Atherosclerotic heart disease of native coronary artery without angina pectoris: Secondary | ICD-10-CM | POA: Insufficient documentation

## 2016-11-23 DIAGNOSIS — X58XXXD Exposure to other specified factors, subsequent encounter: Secondary | ICD-10-CM | POA: Diagnosis not present

## 2016-11-23 DIAGNOSIS — S81801A Unspecified open wound, right lower leg, initial encounter: Secondary | ICD-10-CM | POA: Diagnosis not present

## 2016-11-23 DIAGNOSIS — D649 Anemia, unspecified: Secondary | ICD-10-CM | POA: Diagnosis not present

## 2016-11-23 DIAGNOSIS — I872 Venous insufficiency (chronic) (peripheral): Secondary | ICD-10-CM | POA: Diagnosis not present

## 2016-11-23 DIAGNOSIS — I87321 Chronic venous hypertension (idiopathic) with inflammation of right lower extremity: Secondary | ICD-10-CM | POA: Diagnosis not present

## 2016-11-23 DIAGNOSIS — S81811D Laceration without foreign body, right lower leg, subsequent encounter: Secondary | ICD-10-CM | POA: Diagnosis not present

## 2016-11-23 DIAGNOSIS — I509 Heart failure, unspecified: Secondary | ICD-10-CM | POA: Diagnosis not present

## 2016-11-23 DIAGNOSIS — R600 Localized edema: Secondary | ICD-10-CM | POA: Diagnosis not present

## 2016-11-29 ENCOUNTER — Other Ambulatory Visit: Payer: Self-pay | Admitting: Internal Medicine

## 2016-11-30 DIAGNOSIS — I509 Heart failure, unspecified: Secondary | ICD-10-CM | POA: Diagnosis not present

## 2016-11-30 DIAGNOSIS — D649 Anemia, unspecified: Secondary | ICD-10-CM | POA: Diagnosis not present

## 2016-11-30 DIAGNOSIS — I87321 Chronic venous hypertension (idiopathic) with inflammation of right lower extremity: Secondary | ICD-10-CM | POA: Diagnosis not present

## 2016-11-30 DIAGNOSIS — I110 Hypertensive heart disease with heart failure: Secondary | ICD-10-CM | POA: Diagnosis not present

## 2016-11-30 DIAGNOSIS — S81811D Laceration without foreign body, right lower leg, subsequent encounter: Secondary | ICD-10-CM | POA: Diagnosis not present

## 2016-11-30 DIAGNOSIS — S81801A Unspecified open wound, right lower leg, initial encounter: Secondary | ICD-10-CM | POA: Diagnosis not present

## 2016-11-30 DIAGNOSIS — I2510 Atherosclerotic heart disease of native coronary artery without angina pectoris: Secondary | ICD-10-CM | POA: Diagnosis not present

## 2016-12-03 ENCOUNTER — Other Ambulatory Visit: Payer: Self-pay | Admitting: Internal Medicine

## 2016-12-07 ENCOUNTER — Other Ambulatory Visit: Payer: Self-pay | Admitting: Internal Medicine

## 2016-12-07 DIAGNOSIS — S81811D Laceration without foreign body, right lower leg, subsequent encounter: Secondary | ICD-10-CM | POA: Diagnosis not present

## 2016-12-07 DIAGNOSIS — I87321 Chronic venous hypertension (idiopathic) with inflammation of right lower extremity: Secondary | ICD-10-CM | POA: Diagnosis not present

## 2016-12-07 DIAGNOSIS — I110 Hypertensive heart disease with heart failure: Secondary | ICD-10-CM | POA: Diagnosis not present

## 2016-12-07 DIAGNOSIS — S81801D Unspecified open wound, right lower leg, subsequent encounter: Secondary | ICD-10-CM | POA: Diagnosis not present

## 2016-12-07 DIAGNOSIS — R600 Localized edema: Secondary | ICD-10-CM | POA: Diagnosis not present

## 2016-12-07 DIAGNOSIS — I872 Venous insufficiency (chronic) (peripheral): Secondary | ICD-10-CM | POA: Diagnosis not present

## 2016-12-07 DIAGNOSIS — I509 Heart failure, unspecified: Secondary | ICD-10-CM | POA: Diagnosis not present

## 2016-12-07 DIAGNOSIS — I2510 Atherosclerotic heart disease of native coronary artery without angina pectoris: Secondary | ICD-10-CM | POA: Diagnosis not present

## 2016-12-07 DIAGNOSIS — D649 Anemia, unspecified: Secondary | ICD-10-CM | POA: Diagnosis not present

## 2016-12-14 ENCOUNTER — Ambulatory Visit (INDEPENDENT_AMBULATORY_CARE_PROVIDER_SITE_OTHER): Payer: Medicare Other | Admitting: *Deleted

## 2016-12-14 DIAGNOSIS — I4891 Unspecified atrial fibrillation: Secondary | ICD-10-CM | POA: Diagnosis not present

## 2016-12-14 DIAGNOSIS — Z5181 Encounter for therapeutic drug level monitoring: Secondary | ICD-10-CM

## 2016-12-14 LAB — POCT INR: INR: 2.7

## 2016-12-28 ENCOUNTER — Ambulatory Visit (INDEPENDENT_AMBULATORY_CARE_PROVIDER_SITE_OTHER): Payer: Medicare Other | Admitting: Pharmacist

## 2016-12-28 DIAGNOSIS — I4891 Unspecified atrial fibrillation: Secondary | ICD-10-CM | POA: Diagnosis not present

## 2016-12-28 DIAGNOSIS — Z5181 Encounter for therapeutic drug level monitoring: Secondary | ICD-10-CM

## 2016-12-28 LAB — POCT INR: INR: 1.9

## 2017-01-18 ENCOUNTER — Ambulatory Visit (INDEPENDENT_AMBULATORY_CARE_PROVIDER_SITE_OTHER): Payer: Medicare Other | Admitting: *Deleted

## 2017-01-18 DIAGNOSIS — I4891 Unspecified atrial fibrillation: Secondary | ICD-10-CM

## 2017-01-18 DIAGNOSIS — Z5181 Encounter for therapeutic drug level monitoring: Secondary | ICD-10-CM | POA: Diagnosis not present

## 2017-01-18 LAB — POCT INR: INR: 2

## 2017-01-30 ENCOUNTER — Telehealth: Payer: Self-pay | Admitting: Internal Medicine

## 2017-01-30 DIAGNOSIS — I482 Chronic atrial fibrillation, unspecified: Secondary | ICD-10-CM

## 2017-01-30 NOTE — Telephone Encounter (Signed)
Pt's daughter called regarding pt. She states that pt has been gaining wt for the last 17 days. Dr. [name] has recommended to keep  pt's dry  wt at 83 lbs and if she gained 3 lbs to increased the lasix. For the last 17 days pt's wt has steadily gone up. Pt's daughter states that yesterday pt took 2 /40 mg (80 mg) lasix tablets in the AM and one 40 mg table in the evening. Daughter also double the Potasium to 20 mEq when double the lasix.dose. The increased lasix dose. didn't help's wt has gone up from 17 days ago 86.5 lbs to 91.5 lbs last 3 days. Pt's LE edematous. Pt keeps LE up most of the time. Pt's BP this morning is 143/72 she takes Amlodipine 5 my daily. Pt  denies SOB.   Dr. Acie Fredrickson DOD aware of pt's symptoms and recommends for pt to double medication dose like she has done. 80 mg lasix in the AM and 40 mg in the PM. Also tot double the potassium 10 MEq dose the next 3 days. Pt to have an Echo, Cmet and TSH, pt to see Dr. [name] soon. pt's daughter aware. Pt has already  an appointment with Dr [name] on the [facility] clinic on April 16 th Labs and echo were placed  in [facility].

## 2017-01-30 NOTE — Telephone Encounter (Signed)
New message   Pt daughter is calling. She is asking if there is anything else they can do because swelling won't go down.  Pt c/o swelling: STAT is pt has developed SOB within 24 hours  1. How long have you been experiencing swelling? A couple weeks  2. Where is the swelling located? legs  3.  Are you currently taking a "fluid pill"? Pt daughter says she does take fluid pill-but it's not helping right now.  4.  Are you currently SOB? No per daughter  5.  Have you traveled recently? No

## 2017-02-05 DIAGNOSIS — M5136 Other intervertebral disc degeneration, lumbar region: Secondary | ICD-10-CM | POA: Diagnosis not present

## 2017-02-05 DIAGNOSIS — G8929 Other chronic pain: Secondary | ICD-10-CM | POA: Diagnosis not present

## 2017-02-05 DIAGNOSIS — M5441 Lumbago with sciatica, right side: Secondary | ICD-10-CM | POA: Diagnosis not present

## 2017-02-05 DIAGNOSIS — M4807 Spinal stenosis, lumbosacral region: Secondary | ICD-10-CM | POA: Diagnosis not present

## 2017-02-11 ENCOUNTER — Encounter: Payer: Self-pay | Admitting: Internal Medicine

## 2017-02-11 ENCOUNTER — Encounter (HOSPITAL_BASED_OUTPATIENT_CLINIC_OR_DEPARTMENT_OTHER): Payer: Medicare Other | Attending: Internal Medicine

## 2017-02-11 ENCOUNTER — Other Ambulatory Visit: Payer: Self-pay | Admitting: Internal Medicine

## 2017-02-11 DIAGNOSIS — Z950 Presence of cardiac pacemaker: Secondary | ICD-10-CM | POA: Insufficient documentation

## 2017-02-11 DIAGNOSIS — I110 Hypertensive heart disease with heart failure: Secondary | ICD-10-CM | POA: Insufficient documentation

## 2017-02-11 DIAGNOSIS — D649 Anemia, unspecified: Secondary | ICD-10-CM | POA: Diagnosis not present

## 2017-02-11 DIAGNOSIS — I509 Heart failure, unspecified: Secondary | ICD-10-CM | POA: Insufficient documentation

## 2017-02-11 DIAGNOSIS — J449 Chronic obstructive pulmonary disease, unspecified: Secondary | ICD-10-CM | POA: Diagnosis not present

## 2017-02-11 DIAGNOSIS — Z8673 Personal history of transient ischemic attack (TIA), and cerebral infarction without residual deficits: Secondary | ICD-10-CM | POA: Diagnosis not present

## 2017-02-11 DIAGNOSIS — I2510 Atherosclerotic heart disease of native coronary artery without angina pectoris: Secondary | ICD-10-CM | POA: Insufficient documentation

## 2017-02-11 DIAGNOSIS — R609 Edema, unspecified: Secondary | ICD-10-CM | POA: Diagnosis not present

## 2017-02-11 DIAGNOSIS — H269 Unspecified cataract: Secondary | ICD-10-CM | POA: Diagnosis not present

## 2017-02-11 DIAGNOSIS — I739 Peripheral vascular disease, unspecified: Secondary | ICD-10-CM | POA: Insufficient documentation

## 2017-02-11 DIAGNOSIS — L97822 Non-pressure chronic ulcer of other part of left lower leg with fat layer exposed: Secondary | ICD-10-CM | POA: Diagnosis present

## 2017-02-11 DIAGNOSIS — M1990 Unspecified osteoarthritis, unspecified site: Secondary | ICD-10-CM | POA: Insufficient documentation

## 2017-02-12 DIAGNOSIS — S81812D Laceration without foreign body, left lower leg, subsequent encounter: Secondary | ICD-10-CM | POA: Diagnosis not present

## 2017-02-12 DIAGNOSIS — I2510 Atherosclerotic heart disease of native coronary artery without angina pectoris: Secondary | ICD-10-CM | POA: Diagnosis not present

## 2017-02-12 DIAGNOSIS — M1990 Unspecified osteoarthritis, unspecified site: Secondary | ICD-10-CM | POA: Diagnosis not present

## 2017-02-12 DIAGNOSIS — I4891 Unspecified atrial fibrillation: Secondary | ICD-10-CM | POA: Diagnosis not present

## 2017-02-12 DIAGNOSIS — I872 Venous insufficiency (chronic) (peripheral): Secondary | ICD-10-CM | POA: Diagnosis not present

## 2017-02-12 DIAGNOSIS — Z7901 Long term (current) use of anticoagulants: Secondary | ICD-10-CM | POA: Diagnosis not present

## 2017-02-13 ENCOUNTER — Ambulatory Visit (HOSPITAL_COMMUNITY): Payer: Medicare Other | Attending: Internal Medicine

## 2017-02-13 ENCOUNTER — Other Ambulatory Visit: Payer: Self-pay

## 2017-02-13 ENCOUNTER — Ambulatory Visit (INDEPENDENT_AMBULATORY_CARE_PROVIDER_SITE_OTHER): Payer: Medicare Other | Admitting: *Deleted

## 2017-02-13 ENCOUNTER — Other Ambulatory Visit: Payer: Medicare Other | Admitting: *Deleted

## 2017-02-13 DIAGNOSIS — I482 Chronic atrial fibrillation, unspecified: Secondary | ICD-10-CM

## 2017-02-13 DIAGNOSIS — I2510 Atherosclerotic heart disease of native coronary artery without angina pectoris: Secondary | ICD-10-CM | POA: Insufficient documentation

## 2017-02-13 DIAGNOSIS — I10 Essential (primary) hypertension: Secondary | ICD-10-CM | POA: Insufficient documentation

## 2017-02-13 DIAGNOSIS — Z5181 Encounter for therapeutic drug level monitoring: Secondary | ICD-10-CM

## 2017-02-13 DIAGNOSIS — I4891 Unspecified atrial fibrillation: Secondary | ICD-10-CM | POA: Diagnosis not present

## 2017-02-13 DIAGNOSIS — I083 Combined rheumatic disorders of mitral, aortic and tricuspid valves: Secondary | ICD-10-CM | POA: Insufficient documentation

## 2017-02-13 DIAGNOSIS — Q211 Atrial septal defect: Secondary | ICD-10-CM | POA: Diagnosis not present

## 2017-02-13 LAB — COMPREHENSIVE METABOLIC PANEL
A/G RATIO: 1.7 (ref 1.2–2.2)
ALK PHOS: 67 IU/L (ref 39–117)
ALT: 16 IU/L (ref 0–32)
AST: 61 IU/L — AB (ref 0–40)
Albumin: 4.2 g/dL (ref 3.2–4.6)
BILIRUBIN TOTAL: 0.7 mg/dL (ref 0.0–1.2)
BUN/Creatinine Ratio: 32 — ABNORMAL HIGH (ref 12–28)
BUN: 22 mg/dL (ref 10–36)
CHLORIDE: 104 mmol/L (ref 96–106)
CO2: 21 mmol/L (ref 18–29)
Calcium: 9.3 mg/dL (ref 8.7–10.3)
Creatinine, Ser: 0.69 mg/dL (ref 0.57–1.00)
GFR calc non Af Amer: 74 mL/min/{1.73_m2} (ref >59)
GFR, EST AFRICAN AMERICAN: 85 mL/min/{1.73_m2} (ref >59)
GLUCOSE: 96 mg/dL (ref 65–99)
Globulin, Total: 2.5 g/dL (ref 1.5–4.5)
POTASSIUM: 5.1 mmol/L (ref 3.5–5.2)
Sodium: 136 mmol/L (ref 134–144)
TOTAL PROTEIN: 6.7 g/dL (ref 6.0–8.5)

## 2017-02-13 LAB — POCT INR: INR: 2.3

## 2017-02-14 LAB — TSH: TSH: 0.097 u[IU]/mL — ABNORMAL LOW (ref 0.450–4.500)

## 2017-02-15 DIAGNOSIS — M1990 Unspecified osteoarthritis, unspecified site: Secondary | ICD-10-CM | POA: Diagnosis not present

## 2017-02-15 DIAGNOSIS — I872 Venous insufficiency (chronic) (peripheral): Secondary | ICD-10-CM | POA: Diagnosis not present

## 2017-02-15 DIAGNOSIS — S81812D Laceration without foreign body, left lower leg, subsequent encounter: Secondary | ICD-10-CM | POA: Diagnosis not present

## 2017-02-15 DIAGNOSIS — I2510 Atherosclerotic heart disease of native coronary artery without angina pectoris: Secondary | ICD-10-CM | POA: Diagnosis not present

## 2017-02-15 DIAGNOSIS — Z7901 Long term (current) use of anticoagulants: Secondary | ICD-10-CM | POA: Diagnosis not present

## 2017-02-15 DIAGNOSIS — I4891 Unspecified atrial fibrillation: Secondary | ICD-10-CM | POA: Diagnosis not present

## 2017-02-18 DIAGNOSIS — H269 Unspecified cataract: Secondary | ICD-10-CM | POA: Diagnosis not present

## 2017-02-18 DIAGNOSIS — L97822 Non-pressure chronic ulcer of other part of left lower leg with fat layer exposed: Secondary | ICD-10-CM | POA: Diagnosis not present

## 2017-02-18 DIAGNOSIS — I509 Heart failure, unspecified: Secondary | ICD-10-CM | POA: Diagnosis not present

## 2017-02-18 DIAGNOSIS — I110 Hypertensive heart disease with heart failure: Secondary | ICD-10-CM | POA: Diagnosis not present

## 2017-02-18 DIAGNOSIS — I872 Venous insufficiency (chronic) (peripheral): Secondary | ICD-10-CM | POA: Diagnosis not present

## 2017-02-18 DIAGNOSIS — R609 Edema, unspecified: Secondary | ICD-10-CM | POA: Diagnosis not present

## 2017-02-18 DIAGNOSIS — D649 Anemia, unspecified: Secondary | ICD-10-CM | POA: Diagnosis not present

## 2017-02-19 DIAGNOSIS — M25511 Pain in right shoulder: Secondary | ICD-10-CM | POA: Diagnosis not present

## 2017-02-19 DIAGNOSIS — M25512 Pain in left shoulder: Secondary | ICD-10-CM | POA: Diagnosis not present

## 2017-02-19 DIAGNOSIS — G8929 Other chronic pain: Secondary | ICD-10-CM | POA: Diagnosis not present

## 2017-02-20 DIAGNOSIS — Z7901 Long term (current) use of anticoagulants: Secondary | ICD-10-CM | POA: Diagnosis not present

## 2017-02-20 DIAGNOSIS — I872 Venous insufficiency (chronic) (peripheral): Secondary | ICD-10-CM | POA: Diagnosis not present

## 2017-02-20 DIAGNOSIS — I4891 Unspecified atrial fibrillation: Secondary | ICD-10-CM | POA: Diagnosis not present

## 2017-02-20 DIAGNOSIS — M1990 Unspecified osteoarthritis, unspecified site: Secondary | ICD-10-CM | POA: Diagnosis not present

## 2017-02-20 DIAGNOSIS — S81812D Laceration without foreign body, left lower leg, subsequent encounter: Secondary | ICD-10-CM | POA: Diagnosis not present

## 2017-02-20 DIAGNOSIS — I2510 Atherosclerotic heart disease of native coronary artery without angina pectoris: Secondary | ICD-10-CM | POA: Diagnosis not present

## 2017-02-22 DIAGNOSIS — I872 Venous insufficiency (chronic) (peripheral): Secondary | ICD-10-CM | POA: Diagnosis not present

## 2017-02-22 DIAGNOSIS — I4891 Unspecified atrial fibrillation: Secondary | ICD-10-CM | POA: Diagnosis not present

## 2017-02-22 DIAGNOSIS — S81812D Laceration without foreign body, left lower leg, subsequent encounter: Secondary | ICD-10-CM | POA: Diagnosis not present

## 2017-02-22 DIAGNOSIS — I2510 Atherosclerotic heart disease of native coronary artery without angina pectoris: Secondary | ICD-10-CM | POA: Diagnosis not present

## 2017-02-22 DIAGNOSIS — M1990 Unspecified osteoarthritis, unspecified site: Secondary | ICD-10-CM | POA: Diagnosis not present

## 2017-02-22 DIAGNOSIS — Z7901 Long term (current) use of anticoagulants: Secondary | ICD-10-CM | POA: Diagnosis not present

## 2017-02-25 ENCOUNTER — Ambulatory Visit (INDEPENDENT_AMBULATORY_CARE_PROVIDER_SITE_OTHER): Payer: Medicare Other | Admitting: Internal Medicine

## 2017-02-25 ENCOUNTER — Encounter: Payer: Self-pay | Admitting: Internal Medicine

## 2017-02-25 VITALS — BP 126/60 | HR 70 | Ht <= 58 in | Wt 86.0 lb

## 2017-02-25 DIAGNOSIS — I442 Atrioventricular block, complete: Secondary | ICD-10-CM

## 2017-02-25 DIAGNOSIS — H269 Unspecified cataract: Secondary | ICD-10-CM | POA: Diagnosis not present

## 2017-02-25 DIAGNOSIS — I482 Chronic atrial fibrillation, unspecified: Secondary | ICD-10-CM

## 2017-02-25 DIAGNOSIS — I10 Essential (primary) hypertension: Secondary | ICD-10-CM

## 2017-02-25 DIAGNOSIS — Z950 Presence of cardiac pacemaker: Secondary | ICD-10-CM | POA: Diagnosis not present

## 2017-02-25 DIAGNOSIS — L97822 Non-pressure chronic ulcer of other part of left lower leg with fat layer exposed: Secondary | ICD-10-CM | POA: Diagnosis not present

## 2017-02-25 DIAGNOSIS — R609 Edema, unspecified: Secondary | ICD-10-CM | POA: Diagnosis not present

## 2017-02-25 DIAGNOSIS — I509 Heart failure, unspecified: Secondary | ICD-10-CM | POA: Diagnosis not present

## 2017-02-25 DIAGNOSIS — I110 Hypertensive heart disease with heart failure: Secondary | ICD-10-CM | POA: Diagnosis not present

## 2017-02-25 DIAGNOSIS — D649 Anemia, unspecified: Secondary | ICD-10-CM | POA: Diagnosis not present

## 2017-02-25 NOTE — Patient Instructions (Signed)
Medication Instructions: - Your physician has recommended you make the following change in your medication:  1) Stop norvasc (amlodipine)  2) Increase lasix (furosemide) 40 mg- take 2 tablets (80 mg) by mouth once daily x 5 days, then resume your normal dosing  Labwork: - none ordered  Procedures/Testing: - none ordered  Follow-Up: - Your physician recommends that you schedule a follow-up appointment in: 4 weeks with Tommye Standard, PA for Dr [name].   Any Additional Special Instructions Will Be Listed Below (If Applicable).     If you need a refill on your cardiac medications before your next appointment, please call your pharmacy.

## 2017-02-25 NOTE — Progress Notes (Signed)
Patient Care Team: Golden Circle, FNP as PCP - General (Family Medicine) [name], MD (Cardiology) Suella Broad, MD (Physical Medicine and Rehabilitation) Newton Pigg, MD (Obstetrics and Gynecology)   HPI  [name] is a 83 y.o. female seen in followup for permanent atrial fibrillation. She is status post AV junction on 2 separate occasions and has a previously implanted pacemaker.   Major problems at this juncture is significant fatigue with effort. Her back is feeling somewhat better. There is a significant amount of discomfort in her thighs bilaterally. She denies chest pain.  Echo done 12/03/10 demonstrated EF 65-70%; mild LVH; restricted AV motion with mean gradient of 15 mmHg; mod MR; mod-severe LAE and mod RAE; mod TR; mod PR; PASP 60.     last month since been challenging as she has a nonhealing sore on her leg with failed grafts cellulitis etc. Etc. Etc..   She has been significantly fatigued. She thinks this correlates with the initiation of carvedilol undertaken her hospitalization. This was started for  hypertension   Past Medical History:  Diagnosis Date  . Anemia   . AV block    s/p PPM  . CAD (coronary artery disease)   . Chronic pain syndrome   . Diverticulosis of colon (without mention of hemorrhage)   . DJD (degenerative joint disease)   . LBP (low back pain)   . Osteoporosis    pelvic fx 10/2013 s/p fall  . Ostium secundum type atrial septal defect   . Overactive bladder   . Pure hypercholesterolemia   . Venous insufficiency     Past Surgical History:  Procedure Laterality Date  . ASD REPAIR    . CATARACT EXTRACTION    . GANGLION CYST EXCISION    . INGUINAL HERNIA REPAIR  2011   bilaterally  . PACEMAKER PLACEMENT  1996   [employer]  . PARTIAL HYSTERECTOMY    . SHOULDER SURGERY     x 2  . VESICOVAGINAL FISTULA CLOSURE W/ TAH  1973    Current Outpatient Prescriptions  Medication Sig Dispense Refill  . acetaminophen  (TYLENOL) 500 MG tablet Take 650 mg by mouth every 8 (eight) hours as needed. Take with tramadol as needed for pain    . amLODipine (NORVASC) 5 MG tablet TAKE 1 TABLET (5 MG TOTAL) BY MOUTH DAILY. 30 tablet 11  . Ascorbic Acid (VITAMIN C) 1000 MG tablet Take 1,000 mg by mouth daily.      . Biotin 1000 MCG tablet Take 1,000 mcg by mouth daily.      . Calcium Carbonate-Vitamin D (CALCIUM 600+D) 600-400 MG-UNIT per tablet Take 2 tablets by mouth daily.      . Cholecalciferol (VITAMIN D) 1000 UNITS capsule Take 1,000 Units by mouth daily.      . ferrous sulfate 325 (65 FE) MG tablet Take 325 mg by mouth daily with breakfast.     . furosemide (LASIX) 40 MG tablet TAKE 1 TABLET EVERY OTHER DAY ALTERNATING WITH 2 TABLETS EVERY OTHER DAY 135 tablet 1  . Omega-3 350 MG CAPS Take 1 capsule by mouth every evening.     Marland Kitchen omeprazole (PRILOSEC) 20 MG capsule Take 20 mg by mouth daily as needed (acid reflux).     . potassium chloride 20 MEQ/15ML (10%) SOLN Take 7.5 mLs (10 mEq total) by mouth 2 (two) times daily. 450 mL 6  . tolterodine (DETROL) 2 MG tablet Take 2 mg by mouth 2 (two) times  daily.    . traMADol (ULTRAM) 50 MG tablet Take 1 tablet (50 mg total) by mouth every 6 (six) hours as needed for moderate pain. 30 tablet 0  . vitamin A 8000 UNIT capsule Take 8,000 Units by mouth every other day.      . vitamin E 400 UNIT capsule Take 400 Units by mouth daily.      Marland Kitchen warfarin (COUMADIN) 5 MG tablet TAKE AS DIRECTED BY COUMADIN CLINIC 90 tablet 1   No current facility-administered medications for this visit.     Allergies  Allergen Reactions  . Amiodarone Hcl     REACTION: INTOL to Amiodarone in past  . Codeine Nausea And Vomiting  . Fentanyl     REACTION: causes nausea--dizziness  . Lamisil [Terbinafine Hcl]     Causes nausea    Review of Systems negative except from HPI and PMH  Physical Exam BP 126/60   Pulse 70   Ht 4\' 9"  (1.448 m)   Wt 86 lb (39 kg)   LMP  (LMP Unknown)   SpO2 97%    BMI 18.61 kg/m  Well developed and nourished in no acute distress HENT normal Neck supple with JVP-8 Clear severe kyphosis Regular rate and rhythm, no murmurs or gallops Abd-soft with active BS No Clubbing cyanosis 2+ edema but wrapped R foot which is also malodorous Skin-warm and dry A & Oriented  Grossly normal sensory and motor function  ECG demonstrates ventricular pacing with atrial fibrillation  Assessment and  Plan  Atrial fibrillation-permanent  stable on warfarin  Complete heart block status post AV junction ablation Stable post pacing  Pacemaker-[employer] Jude The patient's device was interrogated.  The information was reviewed. No changes were made in the programming.    HFpEF Euvolemic continue current meds   Hypertension  Low  Dizziness   Her dizziness has a component of orthostasis to it. Her blood pressure is relatively low and has been low over months now at home. We will have her decrease her amlodipine to 2.5 and then stop it  She is also significantly volume overloaded.  I will have her increase her diuretic to 80 mg a day in a single dose for 5 out of 7 days. We hope to be able to decrease her weight by about 5-6 pounds. We'll have her come back to see RU-AP  in 3-4 weeks to assess response to stopping the amlodipine and increase in her diuretics. If her blood pressure goes up, I told him to let us know if he goes over 160, we will need to try an alternative antihypertensive regime.  We spent more than 50% of our >25 min visit in face to face counseling regarding the above

## 2017-02-27 DIAGNOSIS — M1990 Unspecified osteoarthritis, unspecified site: Secondary | ICD-10-CM | POA: Diagnosis not present

## 2017-02-27 DIAGNOSIS — I4891 Unspecified atrial fibrillation: Secondary | ICD-10-CM | POA: Diagnosis not present

## 2017-02-27 DIAGNOSIS — I872 Venous insufficiency (chronic) (peripheral): Secondary | ICD-10-CM | POA: Diagnosis not present

## 2017-02-27 DIAGNOSIS — S81812D Laceration without foreign body, left lower leg, subsequent encounter: Secondary | ICD-10-CM | POA: Diagnosis not present

## 2017-02-27 DIAGNOSIS — I2510 Atherosclerotic heart disease of native coronary artery without angina pectoris: Secondary | ICD-10-CM | POA: Diagnosis not present

## 2017-02-27 DIAGNOSIS — Z7901 Long term (current) use of anticoagulants: Secondary | ICD-10-CM | POA: Diagnosis not present

## 2017-03-01 ENCOUNTER — Encounter: Payer: Medicare Other | Admitting: Internal Medicine

## 2017-03-01 DIAGNOSIS — M1990 Unspecified osteoarthritis, unspecified site: Secondary | ICD-10-CM | POA: Diagnosis not present

## 2017-03-01 DIAGNOSIS — I4891 Unspecified atrial fibrillation: Secondary | ICD-10-CM | POA: Diagnosis not present

## 2017-03-01 DIAGNOSIS — S81812D Laceration without foreign body, left lower leg, subsequent encounter: Secondary | ICD-10-CM | POA: Diagnosis not present

## 2017-03-01 DIAGNOSIS — I872 Venous insufficiency (chronic) (peripheral): Secondary | ICD-10-CM | POA: Diagnosis not present

## 2017-03-01 DIAGNOSIS — I2510 Atherosclerotic heart disease of native coronary artery without angina pectoris: Secondary | ICD-10-CM | POA: Diagnosis not present

## 2017-03-01 DIAGNOSIS — Z7901 Long term (current) use of anticoagulants: Secondary | ICD-10-CM | POA: Diagnosis not present

## 2017-03-04 DIAGNOSIS — R609 Edema, unspecified: Secondary | ICD-10-CM | POA: Diagnosis not present

## 2017-03-04 DIAGNOSIS — D649 Anemia, unspecified: Secondary | ICD-10-CM | POA: Diagnosis not present

## 2017-03-04 DIAGNOSIS — I509 Heart failure, unspecified: Secondary | ICD-10-CM | POA: Diagnosis not present

## 2017-03-04 DIAGNOSIS — H269 Unspecified cataract: Secondary | ICD-10-CM | POA: Diagnosis not present

## 2017-03-04 DIAGNOSIS — I110 Hypertensive heart disease with heart failure: Secondary | ICD-10-CM | POA: Diagnosis not present

## 2017-03-04 DIAGNOSIS — L97822 Non-pressure chronic ulcer of other part of left lower leg with fat layer exposed: Secondary | ICD-10-CM | POA: Diagnosis not present

## 2017-03-06 DIAGNOSIS — I4891 Unspecified atrial fibrillation: Secondary | ICD-10-CM | POA: Diagnosis not present

## 2017-03-06 DIAGNOSIS — Z7901 Long term (current) use of anticoagulants: Secondary | ICD-10-CM | POA: Diagnosis not present

## 2017-03-06 DIAGNOSIS — I2510 Atherosclerotic heart disease of native coronary artery without angina pectoris: Secondary | ICD-10-CM | POA: Diagnosis not present

## 2017-03-06 DIAGNOSIS — S81812D Laceration without foreign body, left lower leg, subsequent encounter: Secondary | ICD-10-CM | POA: Diagnosis not present

## 2017-03-06 DIAGNOSIS — M1990 Unspecified osteoarthritis, unspecified site: Secondary | ICD-10-CM | POA: Diagnosis not present

## 2017-03-06 DIAGNOSIS — I872 Venous insufficiency (chronic) (peripheral): Secondary | ICD-10-CM | POA: Diagnosis not present

## 2017-03-08 DIAGNOSIS — Z7901 Long term (current) use of anticoagulants: Secondary | ICD-10-CM | POA: Diagnosis not present

## 2017-03-08 DIAGNOSIS — I4891 Unspecified atrial fibrillation: Secondary | ICD-10-CM | POA: Diagnosis not present

## 2017-03-08 DIAGNOSIS — I2510 Atherosclerotic heart disease of native coronary artery without angina pectoris: Secondary | ICD-10-CM | POA: Diagnosis not present

## 2017-03-08 DIAGNOSIS — M1990 Unspecified osteoarthritis, unspecified site: Secondary | ICD-10-CM | POA: Diagnosis not present

## 2017-03-08 DIAGNOSIS — S81812D Laceration without foreign body, left lower leg, subsequent encounter: Secondary | ICD-10-CM | POA: Diagnosis not present

## 2017-03-08 DIAGNOSIS — I872 Venous insufficiency (chronic) (peripheral): Secondary | ICD-10-CM | POA: Diagnosis not present

## 2017-03-11 DIAGNOSIS — R609 Edema, unspecified: Secondary | ICD-10-CM | POA: Diagnosis not present

## 2017-03-11 DIAGNOSIS — L97822 Non-pressure chronic ulcer of other part of left lower leg with fat layer exposed: Secondary | ICD-10-CM | POA: Diagnosis not present

## 2017-03-11 DIAGNOSIS — D649 Anemia, unspecified: Secondary | ICD-10-CM | POA: Diagnosis not present

## 2017-03-11 DIAGNOSIS — I509 Heart failure, unspecified: Secondary | ICD-10-CM | POA: Diagnosis not present

## 2017-03-11 DIAGNOSIS — I110 Hypertensive heart disease with heart failure: Secondary | ICD-10-CM | POA: Diagnosis not present

## 2017-03-11 DIAGNOSIS — H269 Unspecified cataract: Secondary | ICD-10-CM | POA: Diagnosis not present

## 2017-03-12 ENCOUNTER — Encounter (HOSPITAL_BASED_OUTPATIENT_CLINIC_OR_DEPARTMENT_OTHER): Payer: Medicare Other | Attending: Internal Medicine

## 2017-03-12 DIAGNOSIS — H269 Unspecified cataract: Secondary | ICD-10-CM | POA: Insufficient documentation

## 2017-03-12 DIAGNOSIS — D649 Anemia, unspecified: Secondary | ICD-10-CM | POA: Insufficient documentation

## 2017-03-12 DIAGNOSIS — I509 Heart failure, unspecified: Secondary | ICD-10-CM | POA: Insufficient documentation

## 2017-03-12 DIAGNOSIS — R609 Edema, unspecified: Secondary | ICD-10-CM | POA: Insufficient documentation

## 2017-03-12 DIAGNOSIS — I739 Peripheral vascular disease, unspecified: Secondary | ICD-10-CM | POA: Diagnosis not present

## 2017-03-12 DIAGNOSIS — I110 Hypertensive heart disease with heart failure: Secondary | ICD-10-CM | POA: Insufficient documentation

## 2017-03-12 DIAGNOSIS — L928 Other granulomatous disorders of the skin and subcutaneous tissue: Secondary | ICD-10-CM | POA: Diagnosis not present

## 2017-03-12 DIAGNOSIS — M1990 Unspecified osteoarthritis, unspecified site: Secondary | ICD-10-CM | POA: Diagnosis not present

## 2017-03-12 DIAGNOSIS — I2510 Atherosclerotic heart disease of native coronary artery without angina pectoris: Secondary | ICD-10-CM | POA: Diagnosis not present

## 2017-03-12 DIAGNOSIS — L97822 Non-pressure chronic ulcer of other part of left lower leg with fat layer exposed: Secondary | ICD-10-CM | POA: Insufficient documentation

## 2017-03-13 ENCOUNTER — Ambulatory Visit (INDEPENDENT_AMBULATORY_CARE_PROVIDER_SITE_OTHER): Payer: Medicare Other | Admitting: *Deleted

## 2017-03-13 DIAGNOSIS — Z5181 Encounter for therapeutic drug level monitoring: Secondary | ICD-10-CM | POA: Diagnosis not present

## 2017-03-13 DIAGNOSIS — I4891 Unspecified atrial fibrillation: Secondary | ICD-10-CM

## 2017-03-13 LAB — POCT INR: INR: 2.7

## 2017-03-15 DIAGNOSIS — I4891 Unspecified atrial fibrillation: Secondary | ICD-10-CM | POA: Diagnosis not present

## 2017-03-15 DIAGNOSIS — Z7901 Long term (current) use of anticoagulants: Secondary | ICD-10-CM | POA: Diagnosis not present

## 2017-03-15 DIAGNOSIS — S81812D Laceration without foreign body, left lower leg, subsequent encounter: Secondary | ICD-10-CM | POA: Diagnosis not present

## 2017-03-15 DIAGNOSIS — I872 Venous insufficiency (chronic) (peripheral): Secondary | ICD-10-CM | POA: Diagnosis not present

## 2017-03-15 DIAGNOSIS — I2510 Atherosclerotic heart disease of native coronary artery without angina pectoris: Secondary | ICD-10-CM | POA: Diagnosis not present

## 2017-03-15 DIAGNOSIS — M1990 Unspecified osteoarthritis, unspecified site: Secondary | ICD-10-CM | POA: Diagnosis not present

## 2017-03-18 DIAGNOSIS — D649 Anemia, unspecified: Secondary | ICD-10-CM | POA: Diagnosis not present

## 2017-03-18 DIAGNOSIS — I2510 Atherosclerotic heart disease of native coronary artery without angina pectoris: Secondary | ICD-10-CM | POA: Diagnosis not present

## 2017-03-18 DIAGNOSIS — I509 Heart failure, unspecified: Secondary | ICD-10-CM | POA: Diagnosis not present

## 2017-03-18 DIAGNOSIS — I110 Hypertensive heart disease with heart failure: Secondary | ICD-10-CM | POA: Diagnosis not present

## 2017-03-18 DIAGNOSIS — R609 Edema, unspecified: Secondary | ICD-10-CM | POA: Diagnosis not present

## 2017-03-18 DIAGNOSIS — L97822 Non-pressure chronic ulcer of other part of left lower leg with fat layer exposed: Secondary | ICD-10-CM | POA: Diagnosis not present

## 2017-03-20 DIAGNOSIS — S81812D Laceration without foreign body, left lower leg, subsequent encounter: Secondary | ICD-10-CM | POA: Diagnosis not present

## 2017-03-20 DIAGNOSIS — I2510 Atherosclerotic heart disease of native coronary artery without angina pectoris: Secondary | ICD-10-CM | POA: Diagnosis not present

## 2017-03-20 DIAGNOSIS — I4891 Unspecified atrial fibrillation: Secondary | ICD-10-CM | POA: Diagnosis not present

## 2017-03-20 DIAGNOSIS — M1990 Unspecified osteoarthritis, unspecified site: Secondary | ICD-10-CM | POA: Diagnosis not present

## 2017-03-20 DIAGNOSIS — I872 Venous insufficiency (chronic) (peripheral): Secondary | ICD-10-CM | POA: Diagnosis not present

## 2017-03-20 DIAGNOSIS — Z7901 Long term (current) use of anticoagulants: Secondary | ICD-10-CM | POA: Diagnosis not present

## 2017-03-22 DIAGNOSIS — I4891 Unspecified atrial fibrillation: Secondary | ICD-10-CM | POA: Diagnosis not present

## 2017-03-22 DIAGNOSIS — I2510 Atherosclerotic heart disease of native coronary artery without angina pectoris: Secondary | ICD-10-CM | POA: Diagnosis not present

## 2017-03-22 DIAGNOSIS — S81812D Laceration without foreign body, left lower leg, subsequent encounter: Secondary | ICD-10-CM | POA: Diagnosis not present

## 2017-03-22 DIAGNOSIS — Z7901 Long term (current) use of anticoagulants: Secondary | ICD-10-CM | POA: Diagnosis not present

## 2017-03-22 DIAGNOSIS — I872 Venous insufficiency (chronic) (peripheral): Secondary | ICD-10-CM | POA: Diagnosis not present

## 2017-03-22 DIAGNOSIS — M1990 Unspecified osteoarthritis, unspecified site: Secondary | ICD-10-CM | POA: Diagnosis not present

## 2017-03-25 DIAGNOSIS — D649 Anemia, unspecified: Secondary | ICD-10-CM | POA: Diagnosis not present

## 2017-03-25 DIAGNOSIS — R600 Localized edema: Secondary | ICD-10-CM | POA: Diagnosis not present

## 2017-03-25 DIAGNOSIS — I2510 Atherosclerotic heart disease of native coronary artery without angina pectoris: Secondary | ICD-10-CM | POA: Diagnosis not present

## 2017-03-25 DIAGNOSIS — I509 Heart failure, unspecified: Secondary | ICD-10-CM | POA: Diagnosis not present

## 2017-03-25 DIAGNOSIS — R609 Edema, unspecified: Secondary | ICD-10-CM | POA: Diagnosis not present

## 2017-03-25 DIAGNOSIS — L97822 Non-pressure chronic ulcer of other part of left lower leg with fat layer exposed: Secondary | ICD-10-CM | POA: Diagnosis not present

## 2017-03-25 DIAGNOSIS — I110 Hypertensive heart disease with heart failure: Secondary | ICD-10-CM | POA: Diagnosis not present

## 2017-04-01 DIAGNOSIS — M19011 Primary osteoarthritis, right shoulder: Secondary | ICD-10-CM | POA: Diagnosis not present

## 2017-04-01 DIAGNOSIS — M19012 Primary osteoarthritis, left shoulder: Secondary | ICD-10-CM | POA: Diagnosis not present

## 2017-04-01 DIAGNOSIS — M25011 Hemarthrosis, right shoulder: Secondary | ICD-10-CM | POA: Diagnosis not present

## 2017-04-02 ENCOUNTER — Ambulatory Visit (INDEPENDENT_AMBULATORY_CARE_PROVIDER_SITE_OTHER): Payer: Medicare Other | Admitting: Physician Assistant

## 2017-04-02 ENCOUNTER — Ambulatory Visit (INDEPENDENT_AMBULATORY_CARE_PROVIDER_SITE_OTHER): Payer: Medicare Other | Admitting: *Deleted

## 2017-04-02 VITALS — BP 132/58 | HR 76 | Ht <= 58 in | Wt 89.0 lb

## 2017-04-02 DIAGNOSIS — I4891 Unspecified atrial fibrillation: Secondary | ICD-10-CM

## 2017-04-02 DIAGNOSIS — Z5181 Encounter for therapeutic drug level monitoring: Secondary | ICD-10-CM

## 2017-04-02 DIAGNOSIS — I442 Atrioventricular block, complete: Secondary | ICD-10-CM

## 2017-04-02 DIAGNOSIS — Z950 Presence of cardiac pacemaker: Secondary | ICD-10-CM

## 2017-04-02 DIAGNOSIS — Z79899 Other long term (current) drug therapy: Secondary | ICD-10-CM | POA: Diagnosis not present

## 2017-04-02 DIAGNOSIS — I5032 Chronic diastolic (congestive) heart failure: Secondary | ICD-10-CM | POA: Diagnosis not present

## 2017-04-02 DIAGNOSIS — I110 Hypertensive heart disease with heart failure: Secondary | ICD-10-CM

## 2017-04-02 DIAGNOSIS — I4821 Permanent atrial fibrillation: Secondary | ICD-10-CM

## 2017-04-02 DIAGNOSIS — I482 Chronic atrial fibrillation: Secondary | ICD-10-CM

## 2017-04-02 LAB — POCT INR: INR: 2.9

## 2017-04-02 NOTE — Patient Instructions (Addendum)
Medication Instructions:   START TAKING LASIX  2 TABLETS  OF 40 MG  ( 80 MG)  EVERY TWO DAYS   THEN TAKE ONE TABLET  40 MG ONCE A DAY   ROTATING   REPEAT EVERY WEEK   If you need a refill on your cardiac medications before your next appointment, please call your pharmacy.  Labwork:  BMET  AND CBC TODAY    Testing/Procedures: NONE ORDERED  TODAY    Follow-Up: IN 4 WEEKS WITH RENEE URSUY    Any Other Special Instructions Will Be Listed Below (If Applicable).

## 2017-04-02 NOTE — Progress Notes (Signed)
Cardiology Office Note Date:  04/02/2017  Patient ID:  [name], [name] 1919/08/19, MRN [id number] PCP:  [place], FNP  Cardiologist:  Dr. [name]   Chief Complaint: planned f/u to assess response to medication changes  History of Present Illness: [name] is a 83 y.o. female with history of permanent AF s/p AVN ablation (x2) w/PPM, CAD, CBA, ostium secundum ASD repaired, HLD, venous insufficiency, HFpEF, HTN.  She comes in today to be seen for Dr. [name], last seen by him last month, she was noted to have fluid OL, as well as a component of orthostatic dizziness.  Her diuretic was increased temp with a goal of 5-6lbs redcution in weight, her amlodipine weaned off  as well.  She is accompanied by her daughter, the patient quite hard of hearing, and helps provide history and information. They report that after the 5 days of 2 lasix pills each day she did get to 83lbs and her swelling much better, she has creeped back up to 86-87lbs by her home scal, and swelling has returned though not as much and her breathing remains much improved.  She developed last weekend an acute pain to her R shoulder and R arm swelling, she saw orthopedics Yesterday who did an ultrasound and found fluid, he aspirated the shoulder and said it appeared to be old blood and injected cortisone, the patient's daughter said he knew she was on the coumadin but did not suggest stopooing it.  She has f/u in 3 weeks.  Her pain and ROM has improved and the swelling is improving with elevation.  (her INR today is 2.9).  She did not have any trauma, falls or bumps.  She apparently has "terrible shoulders" with no cartilage and torn rotator cuffs b/l.  She has not had any bleeding or signs of bleeding since.  The patient denies any kind of CP, no dizziness, near syncope or syncope.  She c/w the wound care clinic, for LLE wound, has unaboot currently LLE  Device information: SJM dual chamber PPM programmed VVIR, implanted  1996, gen change 11/11/08, Dr. Roselie Skinner, s/p AVNode ablation  Past Medical History:  Diagnosis Date  . Anemia   . AV block    s/p PPM  . CAD (coronary artery disease)   . Chronic pain syndrome   . Diverticulosis of colon (without mention of hemorrhage)   . DJD (degenerative joint disease)   . LBP (low back pain)   . Osteoporosis    pelvic fx 10/2013 s/p fall  . Ostium secundum type atrial septal defect   . Overactive bladder   . Pure hypercholesterolemia   . Venous insufficiency     Past Surgical History:  Procedure Laterality Date  . ASD REPAIR    . CATARACT EXTRACTION    . GANGLION CYST EXCISION    . INGUINAL HERNIA REPAIR  2011   bilaterally  . PACEMAKER PLACEMENT  1996   [employer]  . PARTIAL HYSTERECTOMY    . SHOULDER SURGERY     x 2  . VESICOVAGINAL FISTULA CLOSURE W/ TAH  1973    Current Outpatient Prescriptions  Medication Sig Dispense Refill  . acetaminophen (TYLENOL) 500 MG tablet Take 650 mg by mouth every 8 (eight) hours as needed. Take with tramadol as needed for pain    . Ascorbic Acid (VITAMIN C) 1000 MG tablet Take 1,000 mg by mouth daily.      . Biotin 1000 MCG tablet Take 1,000 mcg by  mouth daily.      . Calcium Carbonate-Vitamin D (CALCIUM 600+D) 600-400 MG-UNIT per tablet Take 2 tablets by mouth daily.      . Cholecalciferol (VITAMIN D) 1000 UNITS capsule Take 1,000 Units by mouth daily.      . ferrous sulfate 325 (65 FE) MG tablet Take 325 mg by mouth daily with breakfast.     . furosemide (LASIX) 40 MG tablet TAKE 1 TABLET EVERY OTHER DAY ALTERNATING WITH 2 TABLETS EVERY OTHER DAY 135 tablet 1  . Omega-3 350 MG CAPS Take 1 capsule by mouth every evening.     Marland Kitchen omeprazole (PRILOSEC) 20 MG capsule Take 20 mg by mouth daily as needed (acid reflux).     . potassium chloride 20 MEQ/15ML (10%) SOLN Take 7.5 mLs (10 mEq total) by mouth 2 (two) times daily. 450 mL 6  . tolterodine (DETROL) 2 MG tablet Take 2 mg by mouth 2 (two) times daily.    .  traMADol (ULTRAM) 50 MG tablet Take 1 tablet (50 mg total) by mouth every 6 (six) hours as needed for moderate pain. 30 tablet 0  . vitamin A 8000 UNIT capsule Take 8,000 Units by mouth every other day.      . vitamin E 400 UNIT capsule Take 400 Units by mouth daily.      Marland Kitchen warfarin (COUMADIN) 5 MG tablet TAKE AS DIRECTED BY COUMADIN CLINIC 90 tablet 1   No current facility-administered medications for this visit.     Allergies:   Amiodarone hcl; Codeine; Fentanyl; and Lamisil [terbinafine hcl]   Social History:  The patient  reports that she has never smoked. She has never used smokeless tobacco. She reports that she does not drink alcohol or use drugs.   Family History:  The patient's family history includes Breast cancer in her sister; Emphysema in her father; Heart disease in her mother; Heart failure in her mother.  ROS:  Please see the history of present illness.   All other systems are reviewed and otherwise negative.   PHYSICAL EXAM:  VS:  BP (!) 132/58   Pulse 76   Ht _0  (1.448 m)   Wt 89 lb (40.4 kg)   LMP  (LMP Unknown)   BMI 19.26 kg/m  BMI: Body mass index is 19.26 kg/m. Well nourished, though thin, somewhat frail aooearing, in no acute distress  HEENT: normocephalic, atraumatic  Neck: no JVD, carotid bruits or masses Cardiac:  RRR; no significant murmurs, no rubs, or gallops Lungs:  CTA b/l, no wheezing, rhonchi or rales  Abd: soft, nontender MS: no deformity or atrophy Ext: RLE 1-2+  edema to above knee, LLE with unaboot Skin: warm and dry, no rash Neuro:  No gross deficits appreciated Psych: euthymic mood, full affect  ICD site is stable, no tethering or discomfort, very thin patient, no skin erosion is appreciated   EKG:  Done 02/26/17 was SR, V paced PPM interrogation done today by industry and reviewed by myself: battery is stable, RV lead had transient increased thresholds for 4 weeks feb/March time, though have returned to baseline and lead mesurements  today are good.  02/13/17: TTE Study Conclusions - Left ventricle: The cavity size was normal. Wall thickness was   normal. Systolic function was normal. The estimated ejection   fraction was in the range of 60% to 65%. Wall motion was normal;   there were no regional wall motion abnormalities. The study is   not technically sufficient to allow evaluation of  LV diastolic   function. - Aortic valve: Mildly calcified leaflets. Mild stenosis. There was   mild regurgitation. Mean gradient (S): 9 mm Hg. Peak gradient   (S): 20 mm Hg. - Mitral valve: Calcified annulus. Mildly thickened leaflets .   There was moderate regurgitation. - Left atrium: Severely dilated. - Right ventricle: The cavity size was mildly dilated. Systolic   function is mildly reduced. - Right atrium: Severely dilated. The atrium was normal in size.   Pacer wire or catheter noted in right atrium. - Atrial septum: A septal defect cannot be excluded. - Tricuspid valve: There was moderate regurgitation. - Pulmonic valve: There was mild regurgitation. - Pulmonary arteries: PA peak pressure: 72 mm Hg (S). - Inferior vena cava: The vessel was dilated. The respirophasic   diameter changes were blunted (< 50%), consistent with elevated   central venous pressure. Impressions: - LVEF 60-65%, mild AS, mild AI, severe biatrial enlargment, MAC   with moderate MR, moderate TR, pacer leads noted, mild PI, RVSP   72 mmHg, dilated IVC.   Recent Labs: 05/30/2016: Hemoglobin 11.1; Platelets 156.0 02/13/2017: ALT 16; BUN 22; Creatinine, Ser 0.69; Potassium 5.1; Sodium 136; TSH 0.097  No results found for requested labs within last 8760 hours.   CrCl cannot be calculated (Patient's most recent lab result is older than the maximum 21 days allowed.).   Wt Readings from Last 3 Encounters:  04/02/17 89 lb (40.4 kg)  02/25/17 86 lb (39 kg)  08/15/16 87 lb 6 oz (39.6 kg)     Other studies reviewed: Additional studies/records reviewed  today include: summarized above  ASSESSMENT AND PLAN:  1. Fluid OL, likely diastolic CHF, hypertensive heart disease     Remains overloaded, will have her increase her lasix to 27m 2 pills for 2 days then one in a 3 day cycle/regime   2. HTN     looks OK  3. PPM     As above  4. Permanent AFib     CHA2DS2Vasc is 5 on warfarin, monitored and managed with the coumadin clinic      Disposition: BMET and CBC today, she will c/w orthopedics, and we will see her back in 1 month sooner if needed.  They will continue home weights daily, goal dry weight is about 83lbs, should she dip below that they are asked to call.  Current medicines are reviewed at length with the patient today.  The patient did not have any concerns regarding medicines.  SHaywood Lasso PA-C 04/02/2017 2:52 PM     [place] [phone](office)  [phone](fax)

## 2017-04-03 DIAGNOSIS — M1990 Unspecified osteoarthritis, unspecified site: Secondary | ICD-10-CM | POA: Diagnosis not present

## 2017-04-03 DIAGNOSIS — I4891 Unspecified atrial fibrillation: Secondary | ICD-10-CM | POA: Diagnosis not present

## 2017-04-03 DIAGNOSIS — I2510 Atherosclerotic heart disease of native coronary artery without angina pectoris: Secondary | ICD-10-CM | POA: Diagnosis not present

## 2017-04-03 DIAGNOSIS — S81812D Laceration without foreign body, left lower leg, subsequent encounter: Secondary | ICD-10-CM | POA: Diagnosis not present

## 2017-04-03 DIAGNOSIS — I872 Venous insufficiency (chronic) (peripheral): Secondary | ICD-10-CM | POA: Diagnosis not present

## 2017-04-03 DIAGNOSIS — Z7901 Long term (current) use of anticoagulants: Secondary | ICD-10-CM | POA: Diagnosis not present

## 2017-04-03 LAB — CBC
HEMOGLOBIN: 11.7 g/dL (ref 11.1–15.9)
Hematocrit: 33.5 % — ABNORMAL LOW (ref 34.0–46.6)
MCH: 36 pg — AB (ref 26.6–33.0)
MCHC: 34.9 g/dL (ref 31.5–35.7)
MCV: 103 fL — ABNORMAL HIGH (ref 79–97)
Platelets: 105 10*3/uL — ABNORMAL LOW (ref 150–379)
RBC: 3.25 x10E6/uL — AB (ref 3.77–5.28)
RDW: 15.9 % — ABNORMAL HIGH (ref 12.3–15.4)
WBC: 6.3 10*3/uL (ref 3.4–10.8)

## 2017-04-03 LAB — BASIC METABOLIC PANEL
BUN/Creatinine Ratio: 27 (ref 12–28)
BUN: 21 mg/dL (ref 10–36)
CO2: 21 mmol/L (ref 18–29)
CREATININE: 0.77 mg/dL (ref 0.57–1.00)
Calcium: 9.1 mg/dL (ref 8.7–10.3)
Chloride: 100 mmol/L (ref 96–106)
GFR calc Af Amer: 75 mL/min/{1.73_m2} (ref >59)
GFR, EST NON AFRICAN AMERICAN: 65 mL/min/{1.73_m2} (ref >59)
Glucose: 102 mg/dL — ABNORMAL HIGH (ref 65–99)
Potassium: 4.6 mmol/L (ref 3.5–5.2)
Sodium: 138 mmol/L (ref 134–144)

## 2017-04-05 ENCOUNTER — Other Ambulatory Visit: Payer: Self-pay | Admitting: *Deleted

## 2017-04-05 DIAGNOSIS — D649 Anemia, unspecified: Secondary | ICD-10-CM | POA: Diagnosis not present

## 2017-04-05 DIAGNOSIS — R609 Edema, unspecified: Secondary | ICD-10-CM | POA: Diagnosis not present

## 2017-04-05 DIAGNOSIS — L97822 Non-pressure chronic ulcer of other part of left lower leg with fat layer exposed: Secondary | ICD-10-CM | POA: Diagnosis not present

## 2017-04-05 DIAGNOSIS — I110 Hypertensive heart disease with heart failure: Secondary | ICD-10-CM | POA: Diagnosis not present

## 2017-04-05 DIAGNOSIS — I2510 Atherosclerotic heart disease of native coronary artery without angina pectoris: Secondary | ICD-10-CM | POA: Diagnosis not present

## 2017-04-05 DIAGNOSIS — I509 Heart failure, unspecified: Secondary | ICD-10-CM | POA: Diagnosis not present

## 2017-04-05 MED ORDER — FUROSEMIDE 40 MG PO TABS: ORAL_TABLET | ORAL | 1 refills | Status: DC

## 2017-04-09 ENCOUNTER — Telehealth: Payer: Self-pay | Admitting: Physician Assistant

## 2017-04-09 ENCOUNTER — Telehealth: Payer: Self-pay | Admitting: *Deleted

## 2017-04-09 NOTE — Telephone Encounter (Signed)
-----   Message from [place], [place] sent at 04/04/2017  5:52 PM EDT ----- Labs look stable, please have her repeat BMET in 10-14 days  Thanks Joseph Art

## 2017-04-09 NOTE — Telephone Encounter (Signed)
DAUGHTER AWARE OF RESULTS AND LAB APPT MADE ON 04-17-17

## 2017-04-09 NOTE — Telephone Encounter (Signed)
New message    Pt daughter is calling back about lab results.

## 2017-04-09 NOTE — Telephone Encounter (Signed)
LMOVM TO CALL BACK  

## 2017-04-10 DIAGNOSIS — M1990 Unspecified osteoarthritis, unspecified site: Secondary | ICD-10-CM | POA: Diagnosis not present

## 2017-04-10 DIAGNOSIS — I872 Venous insufficiency (chronic) (peripheral): Secondary | ICD-10-CM | POA: Diagnosis not present

## 2017-04-10 DIAGNOSIS — Z7901 Long term (current) use of anticoagulants: Secondary | ICD-10-CM | POA: Diagnosis not present

## 2017-04-10 DIAGNOSIS — S81812D Laceration without foreign body, left lower leg, subsequent encounter: Secondary | ICD-10-CM | POA: Diagnosis not present

## 2017-04-10 DIAGNOSIS — I4891 Unspecified atrial fibrillation: Secondary | ICD-10-CM | POA: Diagnosis not present

## 2017-04-10 DIAGNOSIS — I2510 Atherosclerotic heart disease of native coronary artery without angina pectoris: Secondary | ICD-10-CM | POA: Diagnosis not present

## 2017-04-15 ENCOUNTER — Telehealth: Payer: Self-pay | Admitting: Physician Assistant

## 2017-04-15 ENCOUNTER — Encounter (HOSPITAL_BASED_OUTPATIENT_CLINIC_OR_DEPARTMENT_OTHER): Payer: Medicare Other | Attending: Internal Medicine

## 2017-04-15 ENCOUNTER — Telehealth: Payer: Self-pay | Admitting: Gastroenterology

## 2017-04-15 DIAGNOSIS — I739 Peripheral vascular disease, unspecified: Secondary | ICD-10-CM | POA: Diagnosis not present

## 2017-04-15 DIAGNOSIS — I509 Heart failure, unspecified: Secondary | ICD-10-CM | POA: Insufficient documentation

## 2017-04-15 DIAGNOSIS — S21211A Laceration without foreign body of right back wall of thorax without penetration into thoracic cavity, initial encounter: Secondary | ICD-10-CM | POA: Diagnosis not present

## 2017-04-15 DIAGNOSIS — I2510 Atherosclerotic heart disease of native coronary artery without angina pectoris: Secondary | ICD-10-CM | POA: Insufficient documentation

## 2017-04-15 DIAGNOSIS — S51011A Laceration without foreign body of right elbow, initial encounter: Secondary | ICD-10-CM | POA: Insufficient documentation

## 2017-04-15 DIAGNOSIS — W19XXXA Unspecified fall, initial encounter: Secondary | ICD-10-CM | POA: Diagnosis not present

## 2017-04-15 DIAGNOSIS — R609 Edema, unspecified: Secondary | ICD-10-CM | POA: Diagnosis not present

## 2017-04-15 DIAGNOSIS — S81011A Laceration without foreign body, right knee, initial encounter: Secondary | ICD-10-CM | POA: Diagnosis not present

## 2017-04-15 DIAGNOSIS — I110 Hypertensive heart disease with heart failure: Secondary | ICD-10-CM | POA: Insufficient documentation

## 2017-04-15 DIAGNOSIS — L97822 Non-pressure chronic ulcer of other part of left lower leg with fat layer exposed: Secondary | ICD-10-CM | POA: Insufficient documentation

## 2017-04-15 DIAGNOSIS — S81811A Laceration without foreign body, right lower leg, initial encounter: Secondary | ICD-10-CM | POA: Diagnosis not present

## 2017-04-15 NOTE — Telephone Encounter (Signed)
The pt's daughter was advised that we can not give any advice for the-pt she has not been seen since 10/17 and that visit was for dysphagia.  She was advised if the patient is in pain she should take her to the ED or call 911.  She agreed

## 2017-04-15 NOTE — Telephone Encounter (Signed)
SPOKE TO DAUGHTER ABOUT MOTHER SYMPTOMS OF SEVERE STOMACH PAINS AND IN BATHROOM SINCE 1030 AM UNABLE TO MAKE AN BOWEL MOVEMENT.   DAUGHTER CONTACTED MOTHER  PCP WHICH REFERRED HER TO GI PROVIDER  WHO RECOMMENDED THE ER FOR FURTHER ANALYSIS DUE TO NO SPACE ON THE SCHEDULE AT THAT FACILITY.  DAUGHTER WAS WONDERING IF THE LASIX DOSES CHANGES MAY HAVE A REASON TO CAUSE CRAMPING POSSIBLY POTASSIUM LOW.  PT DAUGHTER WAS TOLD WILL TALK TO PROVIDER URSUY TO SEE IF SHE MAY HAVE SOME OTHER TYPE OF RECOMMENDATIONS.

## 2017-04-15 NOTE — Telephone Encounter (Signed)
New message    Pt daughter is calling about pt. She said she isn't sure if it's connected but recently pt lasix was changed. Pt daughter states that pt has been having awful stomach cramps this morning. She said pt has been having them every 5 minutes.

## 2017-04-15 NOTE — Telephone Encounter (Signed)
CALLED DAUGHTER BACK WITH RECOMMENDATIONS  PER URSUYTO TAKE TO THE ER . IF HER SYMPTOMS CONTINUE TO WORSEN.  DAUGHTER MENTIONED THAT SYMPTOMS HAD SUBSIDED SINCE LAST CALL. AND IF THE PAINS HAPPEN AGAIN SHE WILL  CONTACT EMERGENCY SERVICES.

## 2017-04-17 ENCOUNTER — Ambulatory Visit (INDEPENDENT_AMBULATORY_CARE_PROVIDER_SITE_OTHER): Payer: Medicare Other | Admitting: *Deleted

## 2017-04-17 ENCOUNTER — Other Ambulatory Visit: Payer: Medicare Other | Admitting: *Deleted

## 2017-04-17 DIAGNOSIS — I4891 Unspecified atrial fibrillation: Secondary | ICD-10-CM | POA: Diagnosis not present

## 2017-04-17 DIAGNOSIS — Z5181 Encounter for therapeutic drug level monitoring: Secondary | ICD-10-CM | POA: Diagnosis not present

## 2017-04-17 DIAGNOSIS — I5032 Chronic diastolic (congestive) heart failure: Secondary | ICD-10-CM

## 2017-04-17 DIAGNOSIS — I482 Chronic atrial fibrillation: Secondary | ICD-10-CM | POA: Diagnosis not present

## 2017-04-17 DIAGNOSIS — E7800 Pure hypercholesterolemia, unspecified: Secondary | ICD-10-CM | POA: Diagnosis not present

## 2017-04-17 DIAGNOSIS — I4821 Permanent atrial fibrillation: Secondary | ICD-10-CM

## 2017-04-17 LAB — POCT INR: INR: 2.6

## 2017-04-18 ENCOUNTER — Emergency Department (HOSPITAL_COMMUNITY)
Admission: EM | Admit: 2017-04-18 | Discharge: 2017-04-18 | Disposition: A | Discharge status: Home / Self Care | Payer: Medicare Other | Attending: Emergency Medicine | Admitting: Emergency Medicine

## 2017-04-18 ENCOUNTER — Emergency Department (HOSPITAL_COMMUNITY): Payer: Medicare Other

## 2017-04-18 ENCOUNTER — Ambulatory Visit: Payer: Medicare Other | Admitting: Family

## 2017-04-18 ENCOUNTER — Encounter (HOSPITAL_COMMUNITY): Payer: Self-pay | Admitting: Emergency Medicine

## 2017-04-18 DIAGNOSIS — Y9389 Activity, other specified: Secondary | ICD-10-CM | POA: Diagnosis not present

## 2017-04-18 DIAGNOSIS — N3000 Acute cystitis without hematuria: Secondary | ICD-10-CM | POA: Diagnosis not present

## 2017-04-18 DIAGNOSIS — Z79899 Other long term (current) drug therapy: Secondary | ICD-10-CM | POA: Insufficient documentation

## 2017-04-18 DIAGNOSIS — Z23 Encounter for immunization: Secondary | ICD-10-CM | POA: Diagnosis not present

## 2017-04-18 DIAGNOSIS — S0001XA Abrasion of scalp, initial encounter: Secondary | ICD-10-CM | POA: Diagnosis not present

## 2017-04-18 DIAGNOSIS — S40812A Abrasion of left upper arm, initial encounter: Secondary | ICD-10-CM | POA: Insufficient documentation

## 2017-04-18 DIAGNOSIS — S069X9A Unspecified intracranial injury with loss of consciousness of unspecified duration, initial encounter: Secondary | ICD-10-CM | POA: Diagnosis not present

## 2017-04-18 DIAGNOSIS — Y92009 Unspecified place in unspecified non-institutional (private) residence as the place of occurrence of the external cause: Secondary | ICD-10-CM | POA: Diagnosis not present

## 2017-04-18 DIAGNOSIS — Z950 Presence of cardiac pacemaker: Secondary | ICD-10-CM | POA: Insufficient documentation

## 2017-04-18 DIAGNOSIS — I2510 Atherosclerotic heart disease of native coronary artery without angina pectoris: Secondary | ICD-10-CM | POA: Insufficient documentation

## 2017-04-18 DIAGNOSIS — W19XXXA Unspecified fall, initial encounter: Secondary | ICD-10-CM

## 2017-04-18 DIAGNOSIS — Z8673 Personal history of transient ischemic attack (TIA), and cerebral infarction without residual deficits: Secondary | ICD-10-CM | POA: Diagnosis not present

## 2017-04-18 DIAGNOSIS — S0101XA Laceration without foreign body of scalp, initial encounter: Secondary | ICD-10-CM | POA: Diagnosis not present

## 2017-04-18 DIAGNOSIS — I5032 Chronic diastolic (congestive) heart failure: Secondary | ICD-10-CM | POA: Diagnosis not present

## 2017-04-18 DIAGNOSIS — S40811A Abrasion of right upper arm, initial encounter: Secondary | ICD-10-CM | POA: Insufficient documentation

## 2017-04-18 DIAGNOSIS — W1839XA Other fall on same level, initial encounter: Secondary | ICD-10-CM | POA: Diagnosis not present

## 2017-04-18 DIAGNOSIS — S0990XA Unspecified injury of head, initial encounter: Secondary | ICD-10-CM | POA: Diagnosis not present

## 2017-04-18 DIAGNOSIS — T148XXA Other injury of unspecified body region, initial encounter: Secondary | ICD-10-CM

## 2017-04-18 DIAGNOSIS — Z7901 Long term (current) use of anticoagulants: Secondary | ICD-10-CM | POA: Diagnosis not present

## 2017-04-18 DIAGNOSIS — S199XXA Unspecified injury of neck, initial encounter: Secondary | ICD-10-CM | POA: Diagnosis not present

## 2017-04-18 DIAGNOSIS — S0093XA Contusion of unspecified part of head, initial encounter: Secondary | ICD-10-CM | POA: Diagnosis not present

## 2017-04-18 DIAGNOSIS — S80812A Abrasion, left lower leg, initial encounter: Secondary | ICD-10-CM | POA: Insufficient documentation

## 2017-04-18 DIAGNOSIS — J189 Pneumonia, unspecified organism: Secondary | ICD-10-CM | POA: Insufficient documentation

## 2017-04-18 DIAGNOSIS — S80811A Abrasion, right lower leg, initial encounter: Secondary | ICD-10-CM | POA: Insufficient documentation

## 2017-04-18 DIAGNOSIS — R079 Chest pain, unspecified: Secondary | ICD-10-CM | POA: Diagnosis not present

## 2017-04-18 DIAGNOSIS — Y999 Unspecified external cause status: Secondary | ICD-10-CM | POA: Insufficient documentation

## 2017-04-18 LAB — PROTIME-INR
INR: 2.36
Prothrombin Time: 26.2 seconds — ABNORMAL HIGH (ref 11.4–15.2)

## 2017-04-18 LAB — BASIC METABOLIC PANEL
ANION GAP: 12 (ref 5–15)
BUN/Creatinine Ratio: 32 — ABNORMAL HIGH (ref 12–28)
BUN: 39 mg/dL — AB (ref 10–36)
BUN: 41 mg/dL — ABNORMAL HIGH (ref 6–20)
CALCIUM: 8.7 mg/dL (ref 8.7–10.3)
CALCIUM: 8.8 mg/dL — AB (ref 8.9–10.3)
CHLORIDE: 103 mmol/L (ref 101–111)
CO2: 18 mmol/L (ref 18–29)
CO2: 22 mmol/L (ref 22–32)
CREATININE: 1.22 mg/dL — AB (ref 0.57–1.00)
Chloride: 101 mmol/L (ref 96–106)
Creatinine, Ser: 1.16 mg/dL — ABNORMAL HIGH (ref 0.44–1.00)
GFR calc Af Amer: 43 mL/min/{1.73_m2} — ABNORMAL LOW (ref >59)
GFR calc Af Amer: 45 mL/min — ABNORMAL LOW (ref >60)
GFR calc non Af Amer: 38 mL/min — ABNORMAL LOW (ref >60)
GFR, EST NON AFRICAN AMERICAN: 37 mL/min/{1.73_m2} — AB (ref >59)
GLUCOSE: 115 mg/dL — AB (ref 65–99)
GLUCOSE: 93 mg/dL (ref 65–99)
POTASSIUM: 4.7 mmol/L (ref 3.5–5.1)
Potassium: 4.8 mmol/L (ref 3.5–5.2)
Sodium: 139 mmol/L (ref 134–144)
Sodium: 137 mmol/L (ref 135–145)

## 2017-04-18 LAB — URINALYSIS, ROUTINE W REFLEX MICROSCOPIC
Bilirubin Urine: NEGATIVE
Glucose, UA: NEGATIVE mg/dL
Hgb urine dipstick: NEGATIVE
KETONES UR: NEGATIVE mg/dL
NITRITE: POSITIVE — AB
PROTEIN: NEGATIVE mg/dL
Specific Gravity, Urine: 1.021 (ref 1.005–1.030)
pH: 5 (ref 5.0–8.0)

## 2017-04-18 LAB — CBC
HEMATOCRIT: 37.9 % (ref 36.0–46.0)
HEMOGLOBIN: 12.6 g/dL (ref 12.0–15.0)
MCH: 36.3 pg — AB (ref 26.0–34.0)
MCHC: 33.2 g/dL (ref 30.0–36.0)
MCV: 109.2 fL — AB (ref 78.0–100.0)
Platelets: 85 10*3/uL — ABNORMAL LOW (ref 150–400)
RBC: 3.47 MIL/uL — ABNORMAL LOW (ref 3.87–5.11)
RDW: 17.3 % — ABNORMAL HIGH (ref 11.5–15.5)
WBC: 5.8 10*3/uL (ref 4.0–10.5)

## 2017-04-18 MED ORDER — TETANUS-DIPHTH-ACELL PERTUSSIS 5-2.5-18.5 LF-MCG/0.5 IM SUSP
0.5000 mL | Freq: Once | INTRAMUSCULAR | Status: AC
  Administered 2017-04-18: 0.5 mL via INTRAMUSCULAR
  Filled 2017-04-18: qty 0.5

## 2017-04-18 MED ORDER — LEVOFLOXACIN 750 MG PO TABS: 750.0000 mg | ORAL_TABLET | Freq: Every day | ORAL | 0 refills | Status: AC

## 2017-04-18 MED ORDER — LEVOFLOXACIN 750 MG PO TABS
750.0000 mg | ORAL_TABLET | Freq: Once | ORAL | Status: AC
  Administered 2017-04-18: 750 mg via ORAL
  Filled 2017-04-18: qty 1

## 2017-04-18 MED ORDER — HYDROCORTISONE 2.5 % RE CREA: TOPICAL_CREAM | RECTAL | 0 refills | Status: DC

## 2017-04-18 NOTE — ED Triage Notes (Signed)
Per ems, pt from home, stood up to go to the bathroom and lost her balance, fell backwards and hit her head on the foot board of bed, pt has hematoma to head, wrapped with gauze bleeding controlled, with skin tear to R elbow and R knee, pt has old healing skin tear to L leg seen by wound care. Pt also c/o diarrhea x4 days. Denies LOC, ambulatory, No neck or back pain. Pt is AAOX4, on coumadin for pacemaker.

## 2017-04-18 NOTE — Discharge Instructions (Signed)
Please take your antibiotics to treat your pneumonia and urinary tract infection. Based on your dosing, you received your dose today and will take 1 more on Saturday, 2 days from now. Please continue to care for your skin tears as previously directed. Please follow-up with your primary care physician in the next several days. If any symptoms change or worsen, please return to the nearest emergency department.

## 2017-04-18 NOTE — ED Notes (Signed)
Pt skin tears wrapped with non stick gauze and bacitracin.

## 2017-04-18 NOTE — ED Notes (Signed)
PT states understanding of care given, follow up care, and medication prescribed. PT wheelchaired from ED to car with a steady gait. 

## 2017-04-18 NOTE — ED Notes (Signed)
ED physician at bedside.

## 2017-04-18 NOTE — ED Notes (Signed)
Pt to xray

## 2017-04-18 NOTE — ED Notes (Signed)
Pt placed on bedpan

## 2017-04-18 NOTE — ED Provider Notes (Signed)
[facility] DEPT Provider Note   CSN: [id number] Arrival date & time: 04/18/17  1134     History   Chief Complaint Chief Complaint  Patient presents with  . Fall    HPI [name] is a 83 y.o. female.  The history is provided by the patient, a relative and medical records.  Fall  This is a new problem. The current episode started less than 1 hour ago. The problem occurs rarely. The problem has been resolved. Associated symptoms include headaches. Pertinent negatives include no chest pain, no abdominal pain and no shortness of breath. Nothing aggravates the symptoms. Nothing relieves the symptoms. She has tried nothing for the symptoms. The treatment provided no relief.    Past Medical History:  Diagnosis Date  . Anemia   . AV block    s/p PPM  . CAD (coronary artery disease)   . Chronic pain syndrome   . Diverticulosis of colon (without mention of hemorrhage)   . DJD (degenerative joint disease)   . LBP (low back pain)   . Osteoporosis    pelvic fx 10/2013 s/p fall  . Ostium secundum type atrial septal defect   . Overactive bladder   . Pure hypercholesterolemia   . Venous insufficiency     Patient Active Problem List   Diagnosis Date Noted  . Pill dysphagia 08/15/2016  . Globus sensation 08/01/2016  . Difficulty swallowing pills 05/30/2016  . Bilateral shoulder pain 05/30/2016  . Dizziness 05/30/2016  . Rib pain on left side 11/03/2015  . Wound of skin 10/07/2015  . Hx of completed stroke 10/05/2015  . Traumatic hematoma 09/30/2015  . Permanent atrial fibrillation ([place]) 09/30/2015  . Leg hematoma 09/30/2015  . Cough   . Extrarenal azotemia 12/28/2014  . Hyponatremia 10/25/2013  . Fracture of multiple pubic rami ([place]) 10/23/2013  . GERD (gastroesophageal reflux disease) 12/29/2012  . Long term current use of anticoagulant 10/29/2011  . Stroke ([place]) 10/23/2011  . Pacemaker  single-chamber-[employer]'s 04/17/2011  . DIASTOLIC HEART FAILURE, CHRONIC  01/08/2011  . Atrial fibrillation ([place]) 12/26/2010  . ANEMIA 12/14/2010  . CAROTID ARTERY DISEASE 07/27/2010  . Chronic pain syndrome 01/23/2010  . CORONARY ARTERY DISEASE 01/15/2009  . ATRIAL SEPTAL DEFECT 01/15/2009  . AV BLOCK, COMPLETE 11/03/2008  . DIVERTICULOSIS OF COLON 07/17/2008  . OVERACTIVE BLADDER 07/17/2008  . DEGENERATIVE JOINT DISEASE 07/15/2008  . Osteoporosis 07/15/2008  . HYPERCHOLESTEROLEMIA 09/04/2007  . VENOUS INSUFFICIENCY 09/04/2007  . RESPIRATORY DISORDER, CHRONIC 09/04/2007  . BACK PAIN, LUMBAR 09/04/2007    Past Surgical History:  Procedure Laterality Date  . ASD REPAIR    . CATARACT EXTRACTION    . GANGLION CYST EXCISION    . INGUINAL HERNIA REPAIR  2011   bilaterally  . PACEMAKER PLACEMENT  1996   [employer] Jude  . PARTIAL HYSTERECTOMY    . SHOULDER SURGERY     x 2  . VESICOVAGINAL FISTULA CLOSURE W/ TAH  1973    OB History    No data available       Home Medications    Prior to Admission medications   Medication Sig Start Date End Date Taking? Authorizing Provider  acetaminophen (TYLENOL) 500 MG tablet Take 650 mg by mouth every 8 (eight) hours as needed. Take with tramadol as needed for pain    [provider]  Ascorbic Acid (VITAMIN C) 1000 MG tablet Take 1,000 mg by mouth daily.      [provider]  Biotin 1000 MCG tablet  Take 1,000 mcg by mouth daily.      [provider]  Calcium Carbonate-Vitamin D (CALCIUM 600+D) 600-400 MG-UNIT per tablet Take 2 tablets by mouth daily.      [provider]  Cholecalciferol (VITAMIN D) 1000 UNITS capsule Take 1,000 Units by mouth daily.      [provider]  ferrous sulfate 325 (65 FE) MG tablet Take 325 mg by mouth daily with breakfast.     [provider]  furosemide (LASIX) 40 MG tablet TAKE   2 TABLETS  OF 40 MG  ( 80 MG)  EVERY TWO DAYS THEN TAKE ONE TABLET 40 MG ONCE A DAY ROTATING. 04/05/17   Baldwin Jamaica, PA-C  Omega-3 350 MG CAPS Take 1  capsule by mouth every evening.     [provider]  omeprazole (PRILOSEC) 20 MG capsule Take 20 mg by mouth daily as needed (acid reflux).     [provider]  potassium chloride 20 MEQ/15ML (10%) SOLN Take 7.5 mLs (10 mEq total) by mouth 2 (two) times daily. 08/10/16   [name], MD  tolterodine (DETROL) 2 MG tablet Take 2 mg by mouth 2 (two) times daily. 08/25/15   [provider]  traMADol (ULTRAM) 50 MG tablet Take 1 tablet (50 mg total) by mouth every 6 (six) hours as needed for moderate pain. 10/25/13   [name], MD  vitamin A 8000 UNIT capsule Take 8,000 Units by mouth every other day.      [provider]  vitamin E 400 UNIT capsule Take 400 Units by mouth daily.      [provider]  warfarin (COUMADIN) 5 MG tablet TAKE AS DIRECTED BY COUMADIN CLINIC 03/29/16   [name], MD    Family History Family History  Problem Relation Age of Onset  . Heart disease Mother   . Heart failure Mother   . Emphysema Father   . Breast cancer Sister   . Heart attack Neg Hx   . Stroke Neg Hx     Social History Social History  Substance Use Topics  . Smoking status: Never Smoker  . Smokeless tobacco: Never Used  . Alcohol use No     Comment: social use     Allergies   Amiodarone hcl; Codeine; Fentanyl; and Lamisil [terbinafine hcl]   Review of Systems Review of Systems  Constitutional: Negative for chills, diaphoresis, fatigue and fever.  HENT: Negative for congestion and rhinorrhea.   Eyes: Negative for visual disturbance.  Respiratory: Positive for cough (recent cough). Negative for chest tightness, shortness of breath, wheezing and stridor.   Cardiovascular: Negative for chest pain, palpitations and leg swelling.  Gastrointestinal: Negative for abdominal pain, constipation, diarrhea, nausea and vomiting.  Genitourinary: Negative for difficulty urinating and dysuria.  Musculoskeletal: Negative for back pain, neck pain and  neck stiffness.  Skin: Negative for rash and wound.  Neurological: Positive for headaches. Negative for light-headedness and numbness.  Psychiatric/Behavioral: Negative for agitation and confusion.  All other systems reviewed and are negative.    Physical Exam Updated Vital Signs BP (!) 141/56   Pulse 70   Temp 97.9 F (36.6 C) (Oral)   Resp 16   LMP  (LMP Unknown)   SpO2 100%   Physical Exam  Constitutional: She is oriented to person, place, and time. She appears well-developed and well-nourished. No distress.  HENT:  Head: Normocephalic. Head is with abrasion and with contusion.    Right Ear: External ear  normal.  Left Ear: External ear normal.  Nose: Nose normal.  Mouth/Throat: Oropharynx is clear and moist. No oropharyngeal exudate.  Eyes: Conjunctivae and EOM are normal. Pupils are equal, round, and reactive to light.  Neck: Normal range of motion. Neck supple.  Cardiovascular: Normal rate and regular rhythm.   Murmur heard. Pulmonary/Chest: No stridor. No respiratory distress. She has no wheezes. She has no rales. She exhibits no tenderness.  Abdominal: She exhibits no distension. There is no tenderness. There is no rebound.  Musculoskeletal: She exhibits no tenderness.       Right knee: No tenderness found.       Arms:      Legs: Abrasions and skin tears on arms and legs. Small abrasion on forehead.   Neurological: She is alert and oriented to person, place, and time. She has normal reflexes. She is not disoriented. She displays no tremor. No cranial nerve deficit or sensory deficit. She exhibits normal muscle tone. Coordination normal. GCS eye subscore is 4. GCS verbal subscore is 5. GCS motor subscore is 6.  Skin: Skin is warm. Capillary refill takes less than 2 seconds. No rash noted. She is not diaphoretic. No erythema.  Psychiatric: She has a normal mood and affect.  Nursing note and vitals reviewed.    ED Treatments / Results  Labs (all labs ordered are  listed, but only abnormal results are displayed) Labs Reviewed  BASIC METABOLIC PANEL - Abnormal; Notable for the following:       Result Value   BUN 41 (*)    Creatinine, Ser 1.16 (*)    Calcium 8.8 (*)    GFR calc non Af Amer 38 (*)    GFR calc Af Amer 45 (*)    All other components within normal limits  CBC - Abnormal; Notable for the following:    RBC 3.47 (*)    MCV 109.2 (*)    MCH 36.3 (*)    RDW 17.3 (*)    Platelets 85 (*)    All other components within normal limits  PROTIME-INR - Abnormal; Notable for the following:    Prothrombin Time 26.2 (*)    All other components within normal limits  URINALYSIS, ROUTINE W REFLEX MICROSCOPIC - Abnormal; Notable for the following:    APPearance HAZY (*)    Nitrite POSITIVE (*)    Leukocytes, UA TRACE (*)    Bacteria, UA RARE (*)    Squamous Epithelial / LPF 0-5 (*)    All other components within normal limits    EKG  EKG Interpretation  Date/Time:  Thursday April 18 2017 11:41:51 EDT Ventricular Rate:  70 PR Interval:    QRS Duration: 134 QT Interval:  456 QTC Calculation: 493 R Axis:   -96 Text Interpretation:  Ventricular-paced rhythm No further analysis attempted due to paced rhythm Paced. Unchanged frmo prior.  No STEMI Confirmed by Antony Blackbird [phone]) on 04/18/2017 5:02:02 PM       Radiology Dg Chest 2 View  Result Date: 04/18/2017 CLINICAL DATA:  Left chest pain EXAM: CHEST  2 VIEW COMPARISON:  09/30/2015 FINDINGS: AP and lateral views of the chest show hyperexpansion. The cardio pericardial silhouette is enlarged. Interstitial markings are diffusely coarsened with chronic features. Right infrahilar atelectasis or pneumonia. Small bilateral pleural effusions. Bones are diffusely demineralized. Posttraumatic deformity right humeral head. Patient is status post median sternotomy. Right permanent pacemaker remains in place. Telemetry leads overlie the chest. IMPRESSION: Cardiomegaly with underlying chronic interstitial  lung disease and  right infrahilar atelectasis or pneumonia. Tiny bilateral pleural effusions. Emphysema. Electronically Signed   By: Misty Stanley M.D.   On: 04/18/2017 12:34   Ct Head Wo Contrast  Result Date: 04/18/2017 CLINICAL DATA:  Pt lost her balance and fell backwards. Struck her head on the foot board of her bed. Hematoma to back of her head. Denies LOC EXAM: CT HEAD WITHOUT CONTRAST CT CERVICAL SPINE WITHOUT CONTRAST TECHNIQUE: Multidetector CT imaging of the head and cervical spine was performed following the standard protocol without intravenous contrast. Multiplanar CT image reconstructions of the cervical spine were also generated. COMPARISON:  None. FINDINGS: CT HEAD FINDINGS Brain: There is significant central and cortical atrophy. Periventricular white matter changes are consistent with small vessel disease. There is no evidence for hemorrhage, mass lesion, or acute infarction. Vascular: There is atherosclerotic calcification of the carotid siphons. Skull: No calvarial fracture. Left frontal scalp edema without underlying calvarial abnormality. Sinuses/Orbits: Orbits intact. Paranasal sinuses are clear. Significant right mastoid effusion. Other: None CT CERVICAL SPINE FINDINGS Alignment: There is convex left scoliosis of the cervical spine, associated significant degenerative changes. There is degenerative 3 mm anterolisthesis of C3 on C4, associated with large calcification of the posterior spinal ligament, creating spinal stenosis over approximately 7 mm at this level . There is anterolisthesis of C7 on T1, also degenerative. Skull base and vertebrae: No acute fracture. Soft tissues and spinal canal: Spinal stenosis at C3-4. Disc levels: Significant disc height loss at C3-4, C4-5, C5-6, and C6-7. Upper chest: A 6 mm nodule is identified at the posterior right lung apex. Right-sided pleural effusion is also noted. Other: Significant atherosclerotic calcification of the carotid arteries.  IMPRESSION: 1. Atrophy and small vessel disease. 2. Left frontal scalp edema without underlying calvarial fracture. 3. Significant right mastoid effusion. 4. Chronic changes in the cervical spine including scoliosis, spondylosis, and stenosis. Changes are particularly prominent at C3-4 through C6-7. 5. 6 mm nodule at the posterior right lung apex possibly related to right pleural effusion. As appropriate, Non-contrast chest CT at 6-12 months is recommended. If the nodule is stable at time of repeat CT, then future CT at 18-24 months (from today's scan) is considered optional for low-risk patients, but is recommended for high-risk patients. This recommendation follows the consensus statement: Guidelines for Management of Incidental Pulmonary Nodules Detected on CT Images: From the Fleischner Society 2017; Radiology 2017; 284:228-243. Electronically Signed   By: Nolon Nations M.D.   On: 04/18/2017 12:56   Ct Cervical Spine Wo Contrast  Result Date: 04/18/2017 CLINICAL DATA:  Pt lost her balance and fell backwards. Struck her head on the foot board of her bed. Hematoma to back of her head. Denies LOC EXAM: CT HEAD WITHOUT CONTRAST CT CERVICAL SPINE WITHOUT CONTRAST TECHNIQUE: Multidetector CT imaging of the head and cervical spine was performed following the standard protocol without intravenous contrast. Multiplanar CT image reconstructions of the cervical spine were also generated. COMPARISON:  None. FINDINGS: CT HEAD FINDINGS Brain: There is significant central and cortical atrophy. Periventricular white matter changes are consistent with small vessel disease. There is no evidence for hemorrhage, mass lesion, or acute infarction. Vascular: There is atherosclerotic calcification of the carotid siphons. Skull: No calvarial fracture. Left frontal scalp edema without underlying calvarial abnormality. Sinuses/Orbits: Orbits intact. Paranasal sinuses are clear. Significant right mastoid effusion. Other: None CT  CERVICAL SPINE FINDINGS Alignment: There is convex left scoliosis of the cervical spine, associated significant degenerative changes. There is degenerative 3 mm anterolisthesis of C3 on  C4, associated with large calcification of the posterior spinal ligament, creating spinal stenosis over approximately 7 mm at this level . There is anterolisthesis of C7 on T1, also degenerative. Skull base and vertebrae: No acute fracture. Soft tissues and spinal canal: Spinal stenosis at C3-4. Disc levels: Significant disc height loss at C3-4, C4-5, C5-6, and C6-7. Upper chest: A 6 mm nodule is identified at the posterior right lung apex. Right-sided pleural effusion is also noted. Other: Significant atherosclerotic calcification of the carotid arteries. IMPRESSION: 1. Atrophy and small vessel disease. 2. Left frontal scalp edema without underlying calvarial fracture. 3. Significant right mastoid effusion. 4. Chronic changes in the cervical spine including scoliosis, spondylosis, and stenosis. Changes are particularly prominent at C3-4 through C6-7. 5. 6 mm nodule at the posterior right lung apex possibly related to right pleural effusion. As appropriate, Non-contrast chest CT at 6-12 months is recommended. If the nodule is stable at time of repeat CT, then future CT at 18-24 months (from today's scan) is considered optional for low-risk patients, but is recommended for high-risk patients. This recommendation follows the consensus statement: Guidelines for Management of Incidental Pulmonary Nodules Detected on CT Images: From the Fleischner Society 2017; Radiology 2017; 284:228-243. Electronically Signed   By: Nolon Nations M.D.   On: 04/18/2017 12:56    Procedures Procedures (including critical care time)  Medications Ordered in ED Medications  Tdap (BOOSTRIX) injection 0.5 mL (0.5 mLs Intramuscular Given 04/18/17 1649)  levofloxacin (LEVAQUIN) tablet 750 mg (750 mg Oral Given 04/18/17 1649)     Initial Impression /  Assessment and Plan / ED Course  I have reviewed the triage vital signs and the nursing notes.  Pertinent labs & imaging results that were available during my care of the patient were reviewed by me and considered in my medical decision making (see chart for details).     [name] is a 83 y.o. female With a past medical history significant for CAD, CHF with pacemaker, atrial fibrillation on anticoagulation therapy, stroke, GERD, and hypercholesterolemia who presents with a fall and head injury. Patient reports that she has had a dry cough recently. She says that she was using her walker to get to the bathroom when she had to leave her walker to go through doorway. Patient says she lost her balance and fell to the ground. She reports hitting her head on her bedpost. She denies loss of consciousness. She denies vision changes, nausea, or vomiting. She reports having a moderate amount of bleeding from her for had as well as multiple skin tears on her extremities. Patient denied preceding chest pain or palpitations. She denies recent fevers or chills.  History and exam are seen above. On exam, patient found to have abrasion to the for had that was hemostatic after pressure. No evidence of deep laceration requiring stitches. Patient also had skin tears on her upper extremities and legs. These were cleaned and dressed with Steri-Strips and bandaging. Patient had unremarkable neurologic exam was normal strength, sensation, and coordination. Normal pulses in [place] these. Patient had a systolic murmur unchanged from prior per family. Lungs were clear otherwise. Abdomen nontender.  Patient had CT imaging given her fall and head injury with anticoagulation. Patient found to have no evidence of intracranial injury. Cervical spine showed no acute injuries. Nodules found in the lungs which the patient was informed of. Patient instructed to follow up for further imaging in the future.  X-ray shows concern for  pneumonia. Given patient's cough, patient  will be treated with antibiotics. Urinalysis also shows concern for UTI. Next paragraph patient given antibiotics to treat both UTI and pneumonia.  Pharmacy was called and after discussion, decision made to treat with Levaquin with one dose orally today and one dose in 48 hours to complete the course. Due to the patient's creatinine clearance and weight, pharmacy fell this was an adequate treatment.  Patient also reports hemorrhoids worsening and was given prescription for Anusol.   Given wound treatment and lack of apparent serious injury, patient felt stable for discharge. Patient's vitals were reassuring. Patient will follow up with PCP and observe strict return precautions. Patient and family voiced understanding of the plan of care for outpatient treatment of community associated pneumonia, UTI, and injuries. Patient will wash for infections. Patient discharged in good condition with understanding plan of care.   Final Clinical Impressions(s) / ED Diagnoses   Final diagnoses:  Fall, initial encounter  Laceration of scalp, initial encounter  Abrasion  Acute cystitis without hematuria  Community acquired pneumonia, unspecified laterality    New Prescriptions Discharge Medication List as of 04/18/2017  4:32 PM    START taking these medications   Details  levofloxacin (LEVAQUIN) 750 MG tablet Take 1 tablet (750 mg total) by mouth daily., Starting Sat 04/20/2017, Until Sun 04/21/2017, Print       Clinical Impression: 1. Fall, initial encounter   2. Laceration of scalp, initial encounter   3. Abrasion   4. Acute cystitis without hematuria   5. Community acquired pneumonia, unspecified laterality     Disposition: Discharge  Condition: Good  I have discussed the results, Dx and Tx plan with the pt(& family if present). He/she/they expressed understanding and agree(s) with the plan. Discharge instructions discussed at great length. Strict  return precautions discussed and pt &/or family have verbalized understanding of the instructions. No further questions at time of discharge.    Discharge Medication List as of 04/18/2017  4:32 PM    START taking these medications   Details  levofloxacin (LEVAQUIN) 750 MG tablet Take 1 tablet (750 mg total) by mouth daily., Starting Sat 04/20/2017, Until Sun 04/21/2017, Print        Follow Up: [facility], [place] [address] [phone]  Schedule an appointment as soon as possible for a visit    [facility] [address] [id number] [place] [phone]  If symptoms worsen     Tegeler, Gwenyth Allegra, MD 04/18/17 2231

## 2017-04-18 NOTE — ED Notes (Signed)
Pt given sprite in order to encourage pt to urinate. Pt aware she needs sample

## 2017-04-19 DIAGNOSIS — S81011A Laceration without foreign body, right knee, initial encounter: Secondary | ICD-10-CM | POA: Diagnosis not present

## 2017-04-19 DIAGNOSIS — S81811A Laceration without foreign body, right lower leg, initial encounter: Secondary | ICD-10-CM | POA: Diagnosis not present

## 2017-04-19 DIAGNOSIS — I2510 Atherosclerotic heart disease of native coronary artery without angina pectoris: Secondary | ICD-10-CM | POA: Diagnosis not present

## 2017-04-19 DIAGNOSIS — L97822 Non-pressure chronic ulcer of other part of left lower leg with fat layer exposed: Secondary | ICD-10-CM | POA: Diagnosis not present

## 2017-04-19 DIAGNOSIS — S81001A Unspecified open wound, right knee, initial encounter: Secondary | ICD-10-CM | POA: Diagnosis not present

## 2017-04-19 DIAGNOSIS — I110 Hypertensive heart disease with heart failure: Secondary | ICD-10-CM | POA: Diagnosis not present

## 2017-04-19 DIAGNOSIS — L97812 Non-pressure chronic ulcer of other part of right lower leg with fat layer exposed: Secondary | ICD-10-CM | POA: Diagnosis not present

## 2017-04-19 DIAGNOSIS — S21211A Laceration without foreign body of right back wall of thorax without penetration into thoracic cavity, initial encounter: Secondary | ICD-10-CM | POA: Diagnosis not present

## 2017-04-19 DIAGNOSIS — I739 Peripheral vascular disease, unspecified: Secondary | ICD-10-CM | POA: Diagnosis not present

## 2017-04-19 DIAGNOSIS — W19XXXA Unspecified fall, initial encounter: Secondary | ICD-10-CM | POA: Diagnosis not present

## 2017-04-19 DIAGNOSIS — R609 Edema, unspecified: Secondary | ICD-10-CM | POA: Diagnosis not present

## 2017-04-19 DIAGNOSIS — I509 Heart failure, unspecified: Secondary | ICD-10-CM | POA: Diagnosis not present

## 2017-04-19 DIAGNOSIS — L97829 Non-pressure chronic ulcer of other part of left lower leg with unspecified severity: Secondary | ICD-10-CM | POA: Diagnosis not present

## 2017-04-19 DIAGNOSIS — S51011A Laceration without foreign body of right elbow, initial encounter: Secondary | ICD-10-CM | POA: Diagnosis not present

## 2017-04-23 DIAGNOSIS — I872 Venous insufficiency (chronic) (peripheral): Secondary | ICD-10-CM | POA: Diagnosis not present

## 2017-04-23 DIAGNOSIS — Z9181 History of falling: Secondary | ICD-10-CM | POA: Diagnosis not present

## 2017-04-23 DIAGNOSIS — I4891 Unspecified atrial fibrillation: Secondary | ICD-10-CM | POA: Diagnosis not present

## 2017-04-23 DIAGNOSIS — S81812D Laceration without foreign body, left lower leg, subsequent encounter: Secondary | ICD-10-CM | POA: Diagnosis not present

## 2017-04-23 DIAGNOSIS — M4800 Spinal stenosis, site unspecified: Secondary | ICD-10-CM | POA: Diagnosis not present

## 2017-04-23 DIAGNOSIS — S81811D Laceration without foreign body, right lower leg, subsequent encounter: Secondary | ICD-10-CM | POA: Diagnosis not present

## 2017-04-23 DIAGNOSIS — Z7901 Long term (current) use of anticoagulants: Secondary | ICD-10-CM | POA: Diagnosis not present

## 2017-04-23 DIAGNOSIS — L97822 Non-pressure chronic ulcer of other part of left lower leg with fat layer exposed: Secondary | ICD-10-CM | POA: Diagnosis not present

## 2017-04-23 DIAGNOSIS — M1991 Primary osteoarthritis, unspecified site: Secondary | ICD-10-CM | POA: Diagnosis not present

## 2017-04-23 DIAGNOSIS — I2510 Atherosclerotic heart disease of native coronary artery without angina pectoris: Secondary | ICD-10-CM | POA: Diagnosis not present

## 2017-04-23 DIAGNOSIS — D649 Anemia, unspecified: Secondary | ICD-10-CM | POA: Diagnosis not present

## 2017-04-23 DIAGNOSIS — Z8673 Personal history of transient ischemic attack (TIA), and cerebral infarction without residual deficits: Secondary | ICD-10-CM | POA: Diagnosis not present

## 2017-04-23 DIAGNOSIS — S51011D Laceration without foreign body of right elbow, subsequent encounter: Secondary | ICD-10-CM | POA: Diagnosis not present

## 2017-04-24 ENCOUNTER — Encounter: Payer: Self-pay | Admitting: Family

## 2017-04-24 ENCOUNTER — Ambulatory Visit (INDEPENDENT_AMBULATORY_CARE_PROVIDER_SITE_OTHER): Payer: Medicare Other | Admitting: Family

## 2017-04-24 VITALS — BP 108/52 | HR 72 | Temp 98.2°F | Resp 16 | Ht <= 58 in | Wt 85.0 lb

## 2017-04-24 DIAGNOSIS — W19XXXA Unspecified fall, initial encounter: Secondary | ICD-10-CM | POA: Insufficient documentation

## 2017-04-24 DIAGNOSIS — H9191 Unspecified hearing loss, right ear: Secondary | ICD-10-CM

## 2017-04-24 DIAGNOSIS — J189 Pneumonia, unspecified organism: Secondary | ICD-10-CM | POA: Diagnosis not present

## 2017-04-24 DIAGNOSIS — R8271 Bacteriuria: Secondary | ICD-10-CM | POA: Insufficient documentation

## 2017-04-24 DIAGNOSIS — R8281 Pyuria: Secondary | ICD-10-CM

## 2017-04-24 DIAGNOSIS — W19XXXD Unspecified fall, subsequent encounter: Secondary | ICD-10-CM

## 2017-04-24 DIAGNOSIS — H9190 Unspecified hearing loss, unspecified ear: Secondary | ICD-10-CM | POA: Insufficient documentation

## 2017-04-24 DIAGNOSIS — N3000 Acute cystitis without hematuria: Secondary | ICD-10-CM

## 2017-04-24 LAB — POCT URINALYSIS DIPSTICK
BILIRUBIN UA: NEGATIVE
GLUCOSE UA: NEGATIVE
Ketones, UA: NEGATIVE
LEUKOCYTES UA: NEGATIVE
NITRITE UA: NEGATIVE
RBC UA: NEGATIVE
Spec Grav, UA: 1.025 (ref 1.010–1.025)
Urobilinogen, UA: NEGATIVE E.U./dL — AB
pH, UA: 6 (ref 5.0–8.0)

## 2017-04-24 MED ORDER — METHYLPREDNISOLONE 4 MG PO TBPK: ORAL_TABLET | ORAL | 0 refills | Status: DC

## 2017-04-24 NOTE — Assessment & Plan Note (Signed)
Multiple contusions and abrasions appear to be healing adequately with no evidence of infection. Continue basic wound care with changes per Wound Center.

## 2017-04-24 NOTE — Progress Notes (Signed)
Subjective:    Patient ID: [name], female    DOB: 1919-03-05, 83 y.o.   MRN: [id number]  Chief Complaint  Patient presents with  . Hospitalization Follow-up    has issues with diarrhea and incontinence of BM, some dizziness    HPI:  [name] is a 83 y.o. female who  has a past medical history of Anemia; AV block; CAD (coronary artery disease); Chronic pain syndrome; Diverticulosis of colon (without mention of hemorrhage); DJD (degenerative joint disease); LBP (low back pain); Osteoporosis; Ostium secundum type atrial septal defect; Overactive bladder; Pure hypercholesterolemia; and Venous insufficiency. and presents today For a hospitalization follow-up.   Recently evaluated in the hospital following a fall following using her walker to get to the bathroom when she had to leave her walker to go through the doorway.. Also expressed headache following the fall. Physical exam with an abrasion and contusion noted on the for head. She was also noted to have abrasion and skin tears on the right arm and right knee. Lab work positive for nitrites and leukocytes. X-rays showed concern for pneumonia. She was started on Levaquin to cover for urinary tract infection and pneumonia. She was also given a prescription for Anusol for the hemorrhoids. All hospital labs, records, and imaging reviewed in detail.  Since leaving the hospital she reports completing levofloxacin as prescribed and denies adverse side effects. No current fevers, chills, urinary symptoms, with improved cough. Endorses having issues with diarrhea and incontinence of bowel movement. No abdominal pain. Continues to experience the associated symptom of feeling like her head is in a barrel. Notes that she has had a significant amount of hearing loss in the last couple of months. No pain or discharge. Hearing loss is primarily on the right side. Modifying factors include Flonase which has not helped very much.    Allergies    Allergen Reactions  . Amiodarone Hcl     REACTION: INTOL to Amiodarone in past  . Codeine Nausea And Vomiting  . Fentanyl     REACTION: causes nausea--dizziness  . Lamisil [Terbinafine Hcl]     Causes nausea      Outpatient Medications Prior to Visit  Medication Sig Dispense Refill  . acetaminophen (TYLENOL) 500 MG tablet Take 650 mg by mouth every 8 (eight) hours as needed. Take with tramadol as needed for pain    . Ascorbic Acid (VITAMIN C) 1000 MG tablet Take 1,000 mg by mouth daily.      . Biotin 1000 MCG tablet Take 1,000 mcg by mouth daily.      . Calcium Carbonate-Vitamin D (CALCIUM 600+D) 600-400 MG-UNIT per tablet Take 2 tablets by mouth daily.      . Cholecalciferol (VITAMIN D) 1000 UNITS capsule Take 1,000 Units by mouth daily.      . ferrous sulfate 325 (65 FE) MG tablet Take 325 mg by mouth daily with breakfast.     . furosemide (LASIX) 40 MG tablet TAKE   2 TABLETS  OF 40 MG  ( 80 MG)  EVERY TWO DAYS THEN TAKE ONE TABLET 40 MG ONCE A DAY ROTATING. (Patient taking differently: 40 mg daily. TAKE   2 TABLETS  OF 40 MG  ( 80 MG)  EVERY TWO DAYS THEN TAKE ONE TABLET 40 MG ONCE A DAY ROTATING.) 180 tablet 1  . hydrocortisone (ANUSOL-HC) 2.5 % rectal cream Apply rectally 2 times daily 28.35 g 0  . Omega-3 350 MG CAPS Take 1 capsule by mouth every  evening.     Marland Kitchen omeprazole (PRILOSEC) 20 MG capsule Take 20 mg by mouth daily as needed (acid reflux).     . potassium chloride 20 MEQ/15ML (10%) SOLN Take 7.5 mLs (10 mEq total) by mouth 2 (two) times daily. 450 mL 6  . tolterodine (DETROL) 2 MG tablet Take 2 mg by mouth 2 (two) times daily.    . traMADol (ULTRAM) 50 MG tablet Take 1 tablet (50 mg total) by mouth every 6 (six) hours as needed for moderate pain. 30 tablet 0  . vitamin A 8000 UNIT capsule Take 8,000 Units by mouth every other day.      . vitamin E 400 UNIT capsule Take 400 Units by mouth daily.      Marland Kitchen warfarin (COUMADIN) 5 MG tablet TAKE AS DIRECTED BY COUMADIN CLINIC 90  tablet 1   No facility-administered medications prior to visit.       Past Surgical History:  Procedure Laterality Date  . ASD REPAIR    . CATARACT EXTRACTION    . GANGLION CYST EXCISION    . INGUINAL HERNIA REPAIR  2011   bilaterally  . PACEMAKER PLACEMENT  1996   [facility]  . PARTIAL HYSTERECTOMY    . SHOULDER SURGERY     x 2  . VESICOVAGINAL FISTULA CLOSURE W/ TAH  1973      Past Medical History:  Diagnosis Date  . Anemia   . AV block    s/p PPM  . CAD (coronary artery disease)   . Chronic pain syndrome   . Diverticulosis of colon (without mention of hemorrhage)   . DJD (degenerative joint disease)   . LBP (low back pain)   . Osteoporosis    pelvic fx 10/2013 s/p fall  . Ostium secundum type atrial septal defect   . Overactive bladder   . Pure hypercholesterolemia   . Venous insufficiency       Review of Systems  Constitutional: Negative for chills and fever.  HENT: Positive for congestion and hearing loss. Negative for sinus pain, sinus pressure and sore throat.   Respiratory: Negative for chest tightness and shortness of breath.   Cardiovascular: Negative for chest pain, palpitations and leg swelling.  Genitourinary: Negative for dysuria, frequency, hematuria and urgency.  Skin: Positive for wound.  Neurological: Positive for headaches.      Objective:    BP (!) 108/52 (BP Location: Left Arm, Patient Position: Sitting, Cuff Size: Small)   Pulse 72   Temp 98.2 F (36.8 C) (Oral)   Resp 16   Ht 4\' 9"  (1.448 m)   Wt 85 lb (38.6 kg)   LMP  (LMP Unknown)   SpO2 97%   BMI 18.39 kg/m  Nursing note and vital signs reviewed.  Physical Exam  Constitutional: She is oriented to person, place, and time. She appears well-developed and well-nourished. No distress.  HENT:  Head:    Right Ear: Tympanic membrane, external ear and ear canal normal. Decreased hearing is noted.  Nose: Nose normal. Right sinus exhibits no maxillary sinus tenderness and no  frontal sinus tenderness. Left sinus exhibits no maxillary sinus tenderness and no frontal sinus tenderness.  Mouth/Throat: Uvula is midline, oropharynx is clear and moist and mucous membranes are normal.  Hearing aids in place  Cardiovascular: Normal rate, regular rhythm, normal heart sounds and intact distal pulses.   Pulmonary/Chest: Effort normal and breath sounds normal.  Neurological: She is alert and oriented to person, place, and time.  Skin: Skin  is warm and dry.  Psychiatric: She has a normal mood and affect. Her behavior is normal. Judgment and thought content normal.       Assessment & Plan:   Problem List Items Addressed This Visit      Respiratory   Community acquired pneumonia    Pneumonia appears resolved with previously prescribed levofloxacin and no current symptoms. No further treatment necessary at this time. Continue to monitor and follow-up if symptoms return.        Genitourinary   UTI (urinary tract infection) - Primary    In office urinalysis negative for leukocytes, nitrites, or hematuria. Urinary tract infection appears to be resolved with previous levofloxacin. No further treatment necessary at this time. Continue to monitor and follow-up if symptoms return..      Relevant Orders   POCT urinalysis dipstick (Completed)     Other   Decreased hearing    Decreased hearing of right ear likely associated with eustachian tube dysfunction and congestion although cannot rule out progressive hearing loss. Start Medrol Dosepak. Recommend follow-up with ENT for hearing test if symptoms worsen or do not improve.       Fall    Multiple contusions and abrasions appear to be healing adequately with no evidence of infection. Continue basic wound care with changes per [facility].           I am having Ms. Cocking start on methylPREDNISolone. I am also having her maintain her Biotin, Calcium Carbonate-Vitamin D, ferrous sulfate, Omega-3, acetaminophen, vitamin A,  vitamin C, Vitamin D, vitamin E, omeprazole, traMADol, tolterodine, warfarin, potassium chloride, furosemide, and hydrocortisone.   Meds ordered this encounter  Medications  . methylPREDNISolone (MEDROL DOSEPAK) 4 MG TBPK tablet    Sig: Take as prescribed.    Dispense:  21 tablet    Refill:  0    Order Specific Question:   Supervising Provider    Answer:   Pricilla Holm A [[id number]]     Follow-up: Return in about 3 months (around 07/25/2017), or if symptoms worsen or fail to improve.  Mauricio Po, FNP

## 2017-04-24 NOTE — Assessment & Plan Note (Signed)
In office urinalysis negative for leukocytes, nitrites, or hematuria. Urinary tract infection appears to be resolved with previous levofloxacin. No further treatment necessary at this time. Continue to monitor and follow-up if symptoms return.Marland Kitchen

## 2017-04-24 NOTE — Assessment & Plan Note (Signed)
Decreased hearing of right ear likely associated with eustachian tube dysfunction and congestion although cannot rule out progressive hearing loss. Start Medrol Dosepak. Recommend follow-up with ENT for hearing test if symptoms worsen or do not improve.

## 2017-04-24 NOTE — Assessment & Plan Note (Signed)
Pneumonia appears resolved with previously prescribed levofloxacin and no current symptoms. No further treatment necessary at this time. Continue to monitor and follow-up if symptoms return.

## 2017-04-24 NOTE — Patient Instructions (Addendum)
Thank you for choosing [facility].  SUMMARY AND INSTRUCTIONS:  Please start the Medrol Dosepak.   Continue Immodium as needed.   Recommend follow up with ENT/ Hearing.  Please drink plenty of fluids over the course of the next week and we will check your kidney function.  Follow up with wound care as needed.   Labs:  Please stop by the lab on the lower level of the building for your blood work. Your results will be released to [place] (or called to you) after review, usually within 72 hours after test completion. If any changes need to be made, you will be notified at that same time.  1.) The lab is open from 7:30am to 5:30 pm Monday-Friday 2.) No appointment is necessary 3.) Fasting (if needed) is 6-8 hours after food and drink; black coffee and water are okay   Follow up:  If your symptoms worsen or fail to improve, please contact our office for further instruction, or in case of emergency go directly to the emergency room at the closest medical facility.

## 2017-04-25 DIAGNOSIS — R609 Edema, unspecified: Secondary | ICD-10-CM | POA: Diagnosis not present

## 2017-04-25 DIAGNOSIS — S81011A Laceration without foreign body, right knee, initial encounter: Secondary | ICD-10-CM | POA: Diagnosis not present

## 2017-04-25 DIAGNOSIS — L97822 Non-pressure chronic ulcer of other part of left lower leg with fat layer exposed: Secondary | ICD-10-CM | POA: Diagnosis not present

## 2017-04-25 DIAGNOSIS — S81811A Laceration without foreign body, right lower leg, initial encounter: Secondary | ICD-10-CM | POA: Diagnosis not present

## 2017-04-25 DIAGNOSIS — L97229 Non-pressure chronic ulcer of left calf with unspecified severity: Secondary | ICD-10-CM | POA: Diagnosis not present

## 2017-04-25 DIAGNOSIS — S0101XA Laceration without foreign body of scalp, initial encounter: Secondary | ICD-10-CM | POA: Diagnosis not present

## 2017-04-25 DIAGNOSIS — S21211A Laceration without foreign body of right back wall of thorax without penetration into thoracic cavity, initial encounter: Secondary | ICD-10-CM | POA: Diagnosis not present

## 2017-04-25 DIAGNOSIS — S21201A Unspecified open wound of right back wall of thorax without penetration into thoracic cavity, initial encounter: Secondary | ICD-10-CM | POA: Diagnosis not present

## 2017-04-25 DIAGNOSIS — S51011A Laceration without foreign body of right elbow, initial encounter: Secondary | ICD-10-CM | POA: Diagnosis not present

## 2017-04-30 ENCOUNTER — Ambulatory Visit (INDEPENDENT_AMBULATORY_CARE_PROVIDER_SITE_OTHER): Payer: Medicare Other | Admitting: *Deleted

## 2017-04-30 ENCOUNTER — Encounter: Payer: Medicare Other | Admitting: Physician Assistant

## 2017-04-30 DIAGNOSIS — L97822 Non-pressure chronic ulcer of other part of left lower leg with fat layer exposed: Secondary | ICD-10-CM | POA: Diagnosis not present

## 2017-04-30 DIAGNOSIS — Z5181 Encounter for therapeutic drug level monitoring: Secondary | ICD-10-CM | POA: Diagnosis not present

## 2017-04-30 DIAGNOSIS — S51011D Laceration without foreign body of right elbow, subsequent encounter: Secondary | ICD-10-CM | POA: Diagnosis not present

## 2017-04-30 DIAGNOSIS — S81811D Laceration without foreign body, right lower leg, subsequent encounter: Secondary | ICD-10-CM | POA: Diagnosis not present

## 2017-04-30 DIAGNOSIS — S81812D Laceration without foreign body, left lower leg, subsequent encounter: Secondary | ICD-10-CM | POA: Diagnosis not present

## 2017-04-30 DIAGNOSIS — I4891 Unspecified atrial fibrillation: Secondary | ICD-10-CM | POA: Diagnosis not present

## 2017-04-30 DIAGNOSIS — I872 Venous insufficiency (chronic) (peripheral): Secondary | ICD-10-CM | POA: Diagnosis not present

## 2017-04-30 LAB — POCT INR: INR: 2.1

## 2017-05-02 DIAGNOSIS — S81011A Laceration without foreign body, right knee, initial encounter: Secondary | ICD-10-CM | POA: Diagnosis not present

## 2017-05-02 DIAGNOSIS — S81811A Laceration without foreign body, right lower leg, initial encounter: Secondary | ICD-10-CM | POA: Diagnosis not present

## 2017-05-02 DIAGNOSIS — S51001A Unspecified open wound of right elbow, initial encounter: Secondary | ICD-10-CM | POA: Diagnosis not present

## 2017-05-02 DIAGNOSIS — L97822 Non-pressure chronic ulcer of other part of left lower leg with fat layer exposed: Secondary | ICD-10-CM | POA: Diagnosis not present

## 2017-05-02 DIAGNOSIS — S21211A Laceration without foreign body of right back wall of thorax without penetration into thoracic cavity, initial encounter: Secondary | ICD-10-CM | POA: Diagnosis not present

## 2017-05-02 DIAGNOSIS — R609 Edema, unspecified: Secondary | ICD-10-CM | POA: Diagnosis not present

## 2017-05-02 DIAGNOSIS — S51011A Laceration without foreign body of right elbow, initial encounter: Secondary | ICD-10-CM | POA: Diagnosis not present

## 2017-05-06 ENCOUNTER — Telehealth: Payer: Self-pay | Admitting: Internal Medicine

## 2017-05-06 DIAGNOSIS — S81812D Laceration without foreign body, left lower leg, subsequent encounter: Secondary | ICD-10-CM | POA: Diagnosis not present

## 2017-05-06 DIAGNOSIS — S81811D Laceration without foreign body, right lower leg, subsequent encounter: Secondary | ICD-10-CM | POA: Diagnosis not present

## 2017-05-06 DIAGNOSIS — I872 Venous insufficiency (chronic) (peripheral): Secondary | ICD-10-CM | POA: Diagnosis not present

## 2017-05-06 DIAGNOSIS — I4891 Unspecified atrial fibrillation: Secondary | ICD-10-CM | POA: Diagnosis not present

## 2017-05-06 DIAGNOSIS — L97822 Non-pressure chronic ulcer of other part of left lower leg with fat layer exposed: Secondary | ICD-10-CM | POA: Diagnosis not present

## 2017-05-06 DIAGNOSIS — S51011D Laceration without foreign body of right elbow, subsequent encounter: Secondary | ICD-10-CM | POA: Diagnosis not present

## 2017-05-06 NOTE — Telephone Encounter (Signed)
It may be worth backing off on the extra fluids  She should bollwoup with her PCP Thanks

## 2017-05-06 NOTE — Telephone Encounter (Signed)
Informed daughter of recommendations per Dr. [name].  Daughter verbalized understanding and was in agreement with this plan.

## 2017-05-06 NOTE — Telephone Encounter (Signed)
Spoke with daughter, DPR on file.  She states that pt's wt yesterday was 88lbs and today she is back up to 90lbs.  Daughter states pt has not been 90lbs in awhile.  Was seen by PCP recently and told to increase her fluids because her crea was up slightly.  Daughter states pt increased her fluids significantly.  Currently taking Lasix 40mg  QD as instructed by our office on 6/12.  Pt has swelling in lower legs and right hand.  Currently legs are wrapped due to some wounds pt has from a fall.  Pt will have a BMET drawn at her PCP tomorrow.  Daughter concerned about wt gain.  Advised I would send message to Dr. [name] for review and advisement.

## 2017-05-06 NOTE — Telephone Encounter (Signed)
°  New Prob   Has some questions regarding Lasix medication. Please call.

## 2017-05-07 ENCOUNTER — Other Ambulatory Visit: Payer: Self-pay

## 2017-05-07 ENCOUNTER — Other Ambulatory Visit (INDEPENDENT_AMBULATORY_CARE_PROVIDER_SITE_OTHER): Payer: Medicare Other

## 2017-05-07 DIAGNOSIS — N289 Disorder of kidney and ureter, unspecified: Secondary | ICD-10-CM

## 2017-05-07 LAB — BASIC METABOLIC PANEL
BUN: 19 mg/dL (ref 6–23)
CALCIUM: 9.3 mg/dL (ref 8.4–10.5)
CHLORIDE: 102 meq/L (ref 96–112)
CO2: 25 meq/L (ref 19–32)
CREATININE: 0.77 mg/dL (ref 0.40–1.20)
GFR: 73.83 mL/min (ref >60.00)
Glucose, Bld: 107 mg/dL — ABNORMAL HIGH (ref 70–99)
Potassium: 4.4 mEq/L (ref 3.5–5.1)
Sodium: 136 mEq/L (ref 135–145)

## 2017-05-08 DIAGNOSIS — S81812D Laceration without foreign body, left lower leg, subsequent encounter: Secondary | ICD-10-CM | POA: Diagnosis not present

## 2017-05-08 DIAGNOSIS — I4891 Unspecified atrial fibrillation: Secondary | ICD-10-CM | POA: Diagnosis not present

## 2017-05-08 DIAGNOSIS — I872 Venous insufficiency (chronic) (peripheral): Secondary | ICD-10-CM | POA: Diagnosis not present

## 2017-05-08 DIAGNOSIS — S51011D Laceration without foreign body of right elbow, subsequent encounter: Secondary | ICD-10-CM | POA: Diagnosis not present

## 2017-05-08 DIAGNOSIS — S81811D Laceration without foreign body, right lower leg, subsequent encounter: Secondary | ICD-10-CM | POA: Diagnosis not present

## 2017-05-08 DIAGNOSIS — L97822 Non-pressure chronic ulcer of other part of left lower leg with fat layer exposed: Secondary | ICD-10-CM | POA: Diagnosis not present

## 2017-05-09 DIAGNOSIS — S21211A Laceration without foreign body of right back wall of thorax without penetration into thoracic cavity, initial encounter: Secondary | ICD-10-CM | POA: Diagnosis not present

## 2017-05-09 DIAGNOSIS — S51011A Laceration without foreign body of right elbow, initial encounter: Secondary | ICD-10-CM | POA: Diagnosis not present

## 2017-05-09 DIAGNOSIS — S81011A Laceration without foreign body, right knee, initial encounter: Secondary | ICD-10-CM | POA: Diagnosis not present

## 2017-05-09 DIAGNOSIS — R609 Edema, unspecified: Secondary | ICD-10-CM | POA: Diagnosis not present

## 2017-05-09 DIAGNOSIS — S81802A Unspecified open wound, left lower leg, initial encounter: Secondary | ICD-10-CM | POA: Diagnosis not present

## 2017-05-09 DIAGNOSIS — S81811A Laceration without foreign body, right lower leg, initial encounter: Secondary | ICD-10-CM | POA: Diagnosis not present

## 2017-05-09 DIAGNOSIS — L97822 Non-pressure chronic ulcer of other part of left lower leg with fat layer exposed: Secondary | ICD-10-CM | POA: Diagnosis not present

## 2017-05-13 DIAGNOSIS — I872 Venous insufficiency (chronic) (peripheral): Secondary | ICD-10-CM | POA: Diagnosis not present

## 2017-05-13 DIAGNOSIS — L97822 Non-pressure chronic ulcer of other part of left lower leg with fat layer exposed: Secondary | ICD-10-CM | POA: Diagnosis not present

## 2017-05-13 DIAGNOSIS — I4891 Unspecified atrial fibrillation: Secondary | ICD-10-CM | POA: Diagnosis not present

## 2017-05-13 DIAGNOSIS — S51011D Laceration without foreign body of right elbow, subsequent encounter: Secondary | ICD-10-CM | POA: Diagnosis not present

## 2017-05-13 DIAGNOSIS — S81812D Laceration without foreign body, left lower leg, subsequent encounter: Secondary | ICD-10-CM | POA: Diagnosis not present

## 2017-05-13 DIAGNOSIS — S81811D Laceration without foreign body, right lower leg, subsequent encounter: Secondary | ICD-10-CM | POA: Diagnosis not present

## 2017-05-13 NOTE — Progress Notes (Signed)
Cardiology Office Note Date:  05/13/2017  Patient ID:  Stephanie, Curry 1919/09/08, MRN [id number] PCP:  [place], FNP  Cardiologist:  Dr. [name]   Chief Complaint: planned f/u to assess fluid status, response to medication changes  History of Present Illness: [name] is a 83 y.o. female with history of permanent AF s/p AVN ablation (x2) w/PPM, CAD, CBA, ostium secundum ASD repaired, HLD, venous insufficiency, HFpEF, HTN.  She comes in today to be seen for Dr. [name], last seen by him last month, she was noted to have fluid OL, as well as a component of orthostatic dizziness.  Her diuretic was increased temp with a goal of 5-6lbs redcution in weight, her amlodipine weaned off  as well.  She was seen by myself May 22, she was accompanied by her daughter, the patient quite hard of hearing, and helps provide history and information. They report that after the 5 days of 2 lasix pills each day she did get to 83lbs and her swelling much better, she has creeped back up to 86-87lbs by her home scale, and swelling has returned though not as much and her breathing remains much improved.  She developed last weekend an acute pain to her R shoulder and R arm swelling, she saw orthopedics Yesterday who did an ultrasound and found fluid, he aspirated the shoulder and said it appeared to be old blood and injected cortisone, the patient's daughter said he knew she was on the coumadin but did not suggest stopping it.  She has f/u in 3 weeks.  Her pain and ROM has improved and the swelling is improving with elevation.  (her INR today is 2.9).  She did not have any trauma, falls or bumps.  She apparently has "terrible shoulders" with no cartilage and torn rotator cuffs b/l.  She has not had any bleeding or signs of bleeding since.  The patient denies any kind of CP, no dizziness, near syncope or syncope.  She c/w the wound care clinic, for LLE wound, has unaboot currently LLE  She is accompanied by her  daughter again today.  She denies SOB, never any CP or palpitations.  She unfortunately has put the weight/water back on.  She is up 7 pounds, he goal or felt to be a dry weight for her is about 83, is 92 today.  She has not had any syncope, no dizziness.  She had an ER vsiit after a fall, says her legs gave out, definitely not a syncopal event.  She was also noted to have incidentally been found with a pneumonia and UTI, given these findings her PMD suspected she may have been dehydrated as well and encouraged PO intake/fluids, she had increased fluid accumulation and was instructed to reduce her PO fluids and monitor.  They have reduced her fluid intake but she remains quite swollen.She denies SOB though.  No CP, palpitations, no dizziness, near syncope or syncope.  Her daughter states she asks at home if her breathing is OK and the patient always tells her yes, and never appears to be SOB or look winded.  Device information: SJM dual chamber PPM programmed VVIR, implanted 1996, gen change 11/11/08, Dr. Roselie Skinner, s/p AVNode ablation  Past Medical History:  Diagnosis Date  . Anemia   . AV block    s/p PPM  . CAD (coronary artery disease)   . Chronic pain syndrome   . Diverticulosis of colon (without mention of hemorrhage)   .  DJD (degenerative joint disease)   . LBP (low back pain)   . Osteoporosis    pelvic fx 10/2013 s/p fall  . Ostium secundum type atrial septal defect   . Overactive bladder   . Pure hypercholesterolemia   . Venous insufficiency     Past Surgical History:  Procedure Laterality Date  . ASD REPAIR    . CATARACT EXTRACTION    . GANGLION CYST EXCISION    . INGUINAL HERNIA REPAIR  2011   bilaterally  . PACEMAKER PLACEMENT  1996   [facility]  . PARTIAL HYSTERECTOMY    . SHOULDER SURGERY     x 2  . VESICOVAGINAL FISTULA CLOSURE W/ TAH  1973    Current Outpatient Prescriptions  Medication Sig Dispense Refill  . acetaminophen (TYLENOL) 500 MG tablet Take 650  mg by mouth every 8 (eight) hours as needed. Take with tramadol as needed for pain    . Ascorbic Acid (VITAMIN C) 1000 MG tablet Take 1,000 mg by mouth daily.      . Biotin 1000 MCG tablet Take 1,000 mcg by mouth daily.      . Calcium Carbonate-Vitamin D (CALCIUM 600+D) 600-400 MG-UNIT per tablet Take 2 tablets by mouth daily.      . Cholecalciferol (VITAMIN D) 1000 UNITS capsule Take 1,000 Units by mouth daily.      . ferrous sulfate 325 (65 FE) MG tablet Take 325 mg by mouth daily with breakfast.     . furosemide (LASIX) 40 MG tablet TAKE   2 TABLETS  OF 40 MG  ( 80 MG)  EVERY TWO DAYS THEN TAKE ONE TABLET 40 MG ONCE A DAY ROTATING. (Patient taking differently: 40 mg daily. TAKE   2 TABLETS  OF 40 MG  ( 80 MG)  EVERY TWO DAYS THEN TAKE ONE TABLET 40 MG ONCE A DAY ROTATING.) 180 tablet 1  . hydrocortisone (ANUSOL-HC) 2.5 % rectal cream Apply rectally 2 times daily 28.35 g 0  . methylPREDNISolone (MEDROL DOSEPAK) 4 MG TBPK tablet Take as prescribed. 21 tablet 0  . Omega-3 350 MG CAPS Take 1 capsule by mouth every evening.     Marland Kitchen omeprazole (PRILOSEC) 20 MG capsule Take 20 mg by mouth daily as needed (acid reflux).     . potassium chloride 20 MEQ/15ML (10%) SOLN Take 7.5 mLs (10 mEq total) by mouth 2 (two) times daily. 450 mL 6  . tolterodine (DETROL) 2 MG tablet Take 2 mg by mouth 2 (two) times daily.    . traMADol (ULTRAM) 50 MG tablet Take 1 tablet (50 mg total) by mouth every 6 (six) hours as needed for moderate pain. 30 tablet 0  . vitamin A 8000 UNIT capsule Take 8,000 Units by mouth every other day.      . vitamin E 400 UNIT capsule Take 400 Units by mouth daily.      Marland Kitchen warfarin (COUMADIN) 5 MG tablet TAKE AS DIRECTED BY COUMADIN CLINIC 90 tablet 1   No current facility-administered medications for this visit.     Allergies:   Amiodarone hcl; Codeine; Fentanyl; and Lamisil [terbinafine hcl]   Social History:  The patient  reports that she has never smoked. She has never used smokeless  tobacco. She reports that she does not drink alcohol or use drugs.   Family History:  The patient's family history includes Breast cancer in her sister; Emphysema in her father; Heart disease in her mother; Heart failure in her mother.  ROS:  Please  see the history of present illness.   All other systems are reviewed and otherwise negative.   PHYSICAL EXAM:  VS:  LMP  (LMP Unknown)  BMI: There is no height or weight on file to calculate BMI. Well nourished, though thin, somewhat frail apearing, in no acute distress  HEENT: normocephalic, atraumatic  Neck: no JVD, carotid bruits or masses Cardiac:  RRR; (paced) no significant murmurs, no rubs, or gallops Lungs: soft crackles at the bases b/l, no wheezing, rhonchi or rales  Abd: soft, nontender MS: no deformity or atrophy Ext: B/l unaboots.  She is markedly edematous to above the waist, unilaterally to RUE, dressing R forearm 2/2 skin tear Skin: warm and dry, no rash Neuro:  No gross deficits appreciated Psych: euthymic mood, full affect  PPM site is stable (R side), no tethering or discomfort, very thin patient, no skin erosion is appreciated   EKG:  Done 02/26/17 was SR, V paced PPM interrogation done today by industry and reviewed by myself: battery and lead measurements are stable, she is pacer dependent, sensitivity decreased  (changed to 4.0)  At last check : battery is stable, RV lead had transient increased thresholds for 4 weeks feb/March time, though have returned to baseline and lead mesurements today are good.  02/13/17: TTE Study Conclusions - Left ventricle: The cavity size was normal. Wall thickness was   normal. Systolic function was normal. The estimated ejection   fraction was in the range of 60% to 65%. Wall motion was normal;   there were no regional wall motion abnormalities. The study is   not technically sufficient to allow evaluation of LV diastolic   function. - Aortic valve: Mildly calcified leaflets. Mild  stenosis. There was   mild regurgitation. Mean gradient (S): 9 mm Hg. Peak gradient   (S): 20 mm Hg. - Mitral valve: Calcified annulus. Mildly thickened leaflets .   There was moderate regurgitation. - Left atrium: Severely dilated. - Right ventricle: The cavity size was mildly dilated. Systolic   function is mildly reduced. - Right atrium: Severely dilated. The atrium was normal in size.   Pacer wire or catheter noted in right atrium. - Atrial septum: A septal defect cannot be excluded. - Tricuspid valve: There was moderate regurgitation. - Pulmonic valve: There was mild regurgitation. - Pulmonary arteries: PA peak pressure: 72 mm Hg (S). - Inferior vena cava: The vessel was dilated. The respirophasic   diameter changes were blunted (< 50%), consistent with elevated   central venous pressure. Impressions: - LVEF 60-65%, mild AS, mild AI, severe biatrial enlargment, MAC   with moderate MR, moderate TR, pacer leads noted, mild PI, RVSP   72 mmHg, dilated IVC.   Recent Labs: 02/13/2017: ALT 16; TSH 0.097 04/18/2017: Hemoglobin 12.6; Platelets 85 05/07/2017: BUN 19; Creatinine, Ser 0.77; Potassium 4.4; Sodium 136  No results found for requested labs within last 8760 hours.   CrCl cannot be calculated (Unknown ideal weight.).   Wt Readings from Last 3 Encounters:  04/24/17 85 lb (38.6 kg)  04/02/17 89 lb (40.4 kg)  02/25/17 86 lb (39 kg)     Other studies reviewed: Additional studies/records reviewed today include: summarized above  ASSESSMENT AND PLAN:  1. HFpEF    Each time she has been given temporary increase in diuresis she does well and edema weight go down but always returns     No SOB     She is markedly edematous  The patient is seen with dr. [name].  She  will take 56m lasix for 5 days, then resume 835m80mg-40mg regime Discussed with the patient and daughter that we will need to accepet a bump in her creat in order to keep her swelling/water retention controlled We  will keep close eye on her, plan BMET in 2 weeks, and visit in 3 weeks.  The patient's daughter will call if no improvement is noted.   2. HTN     looks OK  3. PPM     Stable findings, no changes  4. Permanent AFib     CHA2DS2Vasc is 5 on warfarin, monitored and managed with the coumadin clinic      Disposition: as above  Current medicines are reviewed at length with the patient today.  The patient did not have any concerns regarding medicines.  SiHaywood LassoPA-C 05/13/2017 6:36 AM     [place] [phone])  [phone])

## 2017-05-14 ENCOUNTER — Ambulatory Visit (INDEPENDENT_AMBULATORY_CARE_PROVIDER_SITE_OTHER): Payer: Medicare Other | Admitting: *Deleted

## 2017-05-14 ENCOUNTER — Ambulatory Visit (INDEPENDENT_AMBULATORY_CARE_PROVIDER_SITE_OTHER): Payer: Medicare Other | Admitting: Physician Assistant

## 2017-05-14 VITALS — BP 110/60 | HR 70 | Ht <= 58 in | Wt 92.0 lb

## 2017-05-14 DIAGNOSIS — I442 Atrioventricular block, complete: Secondary | ICD-10-CM | POA: Diagnosis not present

## 2017-05-14 DIAGNOSIS — I5032 Chronic diastolic (congestive) heart failure: Secondary | ICD-10-CM | POA: Diagnosis not present

## 2017-05-14 DIAGNOSIS — I4821 Permanent atrial fibrillation: Secondary | ICD-10-CM

## 2017-05-14 DIAGNOSIS — I5030 Unspecified diastolic (congestive) heart failure: Secondary | ICD-10-CM | POA: Diagnosis not present

## 2017-05-14 DIAGNOSIS — Z79899 Other long term (current) drug therapy: Secondary | ICD-10-CM | POA: Diagnosis not present

## 2017-05-14 DIAGNOSIS — Z950 Presence of cardiac pacemaker: Secondary | ICD-10-CM | POA: Diagnosis not present

## 2017-05-14 DIAGNOSIS — Z5181 Encounter for therapeutic drug level monitoring: Secondary | ICD-10-CM

## 2017-05-14 DIAGNOSIS — I10 Essential (primary) hypertension: Secondary | ICD-10-CM | POA: Diagnosis not present

## 2017-05-14 DIAGNOSIS — I482 Chronic atrial fibrillation: Secondary | ICD-10-CM | POA: Diagnosis not present

## 2017-05-14 DIAGNOSIS — I4891 Unspecified atrial fibrillation: Secondary | ICD-10-CM

## 2017-05-14 LAB — POCT INR: INR: 4

## 2017-05-14 MED ORDER — FUROSEMIDE 40 MG PO TABS: 40.0000 mg | ORAL_TABLET | Freq: Every day | ORAL | 3 refills | Status: DC

## 2017-05-14 MED ORDER — FUROSEMIDE 40 MG PO TABS: ORAL_TABLET | ORAL | 3 refills | Status: DC

## 2017-05-14 NOTE — Patient Instructions (Addendum)
Medication Instructions:   TAKE 80 MG LASIX DAILY FOR 5 DAYS  ONLY   THEN TAKE 40 MG... 80 MG.... 80 MG .Marland Kitchen..40 MG LASIX     1. ON THE DAYS YOU TAKE 80 MG  OF LASIX TAKE 20 MG POTASSIUM  THREE TIMES  A DAY .   2. ON DAYS YOU TAKE 40 MG OF  LASIX TAKE 20 MG POTASSSIUM TWICE A DAY    If you need a refill on your cardiac medications before your next appointment, please call your pharmacy.  Labwork:  RETURN FOR BMET IN 2 WEEKS ( SCHEDULER  LINDA AWARE OF CHANGE )   Testing/Procedures: NONE ORDERED  TODAY    Follow-Up:  IN 3 WEEKS WITH RENEE URSUY  ( SCHEDULER LINDA  AWARE OF CHANGE)  Any Other Special Instructions Will Be Listed Below (If Applicable).

## 2017-05-15 DIAGNOSIS — I872 Venous insufficiency (chronic) (peripheral): Secondary | ICD-10-CM | POA: Diagnosis not present

## 2017-05-15 DIAGNOSIS — S51011D Laceration without foreign body of right elbow, subsequent encounter: Secondary | ICD-10-CM | POA: Diagnosis not present

## 2017-05-15 DIAGNOSIS — I4891 Unspecified atrial fibrillation: Secondary | ICD-10-CM | POA: Diagnosis not present

## 2017-05-15 DIAGNOSIS — L97822 Non-pressure chronic ulcer of other part of left lower leg with fat layer exposed: Secondary | ICD-10-CM | POA: Diagnosis not present

## 2017-05-15 DIAGNOSIS — S81811D Laceration without foreign body, right lower leg, subsequent encounter: Secondary | ICD-10-CM | POA: Diagnosis not present

## 2017-05-15 DIAGNOSIS — S81812D Laceration without foreign body, left lower leg, subsequent encounter: Secondary | ICD-10-CM | POA: Diagnosis not present

## 2017-05-16 ENCOUNTER — Encounter (HOSPITAL_BASED_OUTPATIENT_CLINIC_OR_DEPARTMENT_OTHER): Payer: Medicare Other | Attending: Internal Medicine

## 2017-05-16 DIAGNOSIS — Z872 Personal history of diseases of the skin and subcutaneous tissue: Secondary | ICD-10-CM | POA: Insufficient documentation

## 2017-05-16 DIAGNOSIS — I5022 Chronic systolic (congestive) heart failure: Secondary | ICD-10-CM | POA: Diagnosis not present

## 2017-05-16 DIAGNOSIS — L97829 Non-pressure chronic ulcer of other part of left lower leg with unspecified severity: Secondary | ICD-10-CM | POA: Diagnosis present

## 2017-05-16 DIAGNOSIS — I110 Hypertensive heart disease with heart failure: Secondary | ICD-10-CM | POA: Insufficient documentation

## 2017-05-16 DIAGNOSIS — L97229 Non-pressure chronic ulcer of left calf with unspecified severity: Secondary | ICD-10-CM | POA: Diagnosis not present

## 2017-05-16 DIAGNOSIS — I2510 Atherosclerotic heart disease of native coronary artery without angina pectoris: Secondary | ICD-10-CM | POA: Insufficient documentation

## 2017-05-16 DIAGNOSIS — I872 Venous insufficiency (chronic) (peripheral): Secondary | ICD-10-CM | POA: Diagnosis not present

## 2017-05-16 DIAGNOSIS — R609 Edema, unspecified: Secondary | ICD-10-CM | POA: Insufficient documentation

## 2017-05-16 DIAGNOSIS — S81811A Laceration without foreign body, right lower leg, initial encounter: Secondary | ICD-10-CM | POA: Diagnosis not present

## 2017-05-16 DIAGNOSIS — W19XXXA Unspecified fall, initial encounter: Secondary | ICD-10-CM | POA: Diagnosis not present

## 2017-05-16 DIAGNOSIS — S81812A Laceration without foreign body, left lower leg, initial encounter: Secondary | ICD-10-CM | POA: Diagnosis not present

## 2017-05-16 DIAGNOSIS — S51011A Laceration without foreign body of right elbow, initial encounter: Secondary | ICD-10-CM | POA: Diagnosis not present

## 2017-05-16 DIAGNOSIS — S0101XD Laceration without foreign body of scalp, subsequent encounter: Secondary | ICD-10-CM | POA: Diagnosis not present

## 2017-05-16 DIAGNOSIS — S81812D Laceration without foreign body, left lower leg, subsequent encounter: Secondary | ICD-10-CM | POA: Diagnosis not present

## 2017-05-16 DIAGNOSIS — S81011A Laceration without foreign body, right knee, initial encounter: Secondary | ICD-10-CM | POA: Diagnosis not present

## 2017-05-20 DIAGNOSIS — S81811D Laceration without foreign body, right lower leg, subsequent encounter: Secondary | ICD-10-CM | POA: Diagnosis not present

## 2017-05-20 DIAGNOSIS — L97822 Non-pressure chronic ulcer of other part of left lower leg with fat layer exposed: Secondary | ICD-10-CM | POA: Diagnosis not present

## 2017-05-20 DIAGNOSIS — S51011D Laceration without foreign body of right elbow, subsequent encounter: Secondary | ICD-10-CM | POA: Diagnosis not present

## 2017-05-20 DIAGNOSIS — I4891 Unspecified atrial fibrillation: Secondary | ICD-10-CM | POA: Diagnosis not present

## 2017-05-20 DIAGNOSIS — I872 Venous insufficiency (chronic) (peripheral): Secondary | ICD-10-CM | POA: Diagnosis not present

## 2017-05-20 DIAGNOSIS — S81812D Laceration without foreign body, left lower leg, subsequent encounter: Secondary | ICD-10-CM | POA: Diagnosis not present

## 2017-05-22 DIAGNOSIS — I4891 Unspecified atrial fibrillation: Secondary | ICD-10-CM | POA: Diagnosis not present

## 2017-05-22 DIAGNOSIS — S81811D Laceration without foreign body, right lower leg, subsequent encounter: Secondary | ICD-10-CM | POA: Diagnosis not present

## 2017-05-22 DIAGNOSIS — S51011D Laceration without foreign body of right elbow, subsequent encounter: Secondary | ICD-10-CM | POA: Diagnosis not present

## 2017-05-22 DIAGNOSIS — L97822 Non-pressure chronic ulcer of other part of left lower leg with fat layer exposed: Secondary | ICD-10-CM | POA: Diagnosis not present

## 2017-05-22 DIAGNOSIS — I872 Venous insufficiency (chronic) (peripheral): Secondary | ICD-10-CM | POA: Diagnosis not present

## 2017-05-22 DIAGNOSIS — S81812D Laceration without foreign body, left lower leg, subsequent encounter: Secondary | ICD-10-CM | POA: Diagnosis not present

## 2017-05-23 ENCOUNTER — Telehealth: Payer: Self-pay | Admitting: Physician Assistant

## 2017-05-23 ENCOUNTER — Telehealth: Payer: Self-pay | Admitting: Internal Medicine

## 2017-05-23 DIAGNOSIS — S81811A Laceration without foreign body, right lower leg, initial encounter: Secondary | ICD-10-CM | POA: Diagnosis not present

## 2017-05-23 DIAGNOSIS — S81812A Laceration without foreign body, left lower leg, initial encounter: Secondary | ICD-10-CM | POA: Diagnosis not present

## 2017-05-23 DIAGNOSIS — L97822 Non-pressure chronic ulcer of other part of left lower leg with fat layer exposed: Secondary | ICD-10-CM | POA: Diagnosis not present

## 2017-05-23 DIAGNOSIS — R609 Edema, unspecified: Secondary | ICD-10-CM | POA: Diagnosis not present

## 2017-05-23 DIAGNOSIS — S51011A Laceration without foreign body of right elbow, initial encounter: Secondary | ICD-10-CM | POA: Diagnosis not present

## 2017-05-23 DIAGNOSIS — I2510 Atherosclerotic heart disease of native coronary artery without angina pectoris: Secondary | ICD-10-CM | POA: Diagnosis not present

## 2017-05-23 DIAGNOSIS — Z872 Personal history of diseases of the skin and subcutaneous tissue: Secondary | ICD-10-CM | POA: Diagnosis not present

## 2017-05-23 DIAGNOSIS — S51001A Unspecified open wound of right elbow, initial encounter: Secondary | ICD-10-CM | POA: Diagnosis not present

## 2017-05-23 NOTE — Telephone Encounter (Signed)
[name]( Daughter) is calling to let [name]  how her mother is doing on the change on the Lasix and the Potassium dosage . Its not doing great , She has drop from 90lbs to 88lbs .  Held at 88.5 for two days and now she is at 89lbs . Still retaining a lot of fluid in her legs . Please call

## 2017-05-23 NOTE — Telephone Encounter (Signed)
Spoke with the patient's daughter [name], UE swelling has improved significantly, she has only lost 2 pounds, LE swelling is significant and continues fairly unchanged back on 80-80-40mg  regime, they are instructed to continue 80mg  daily and keep lab appointment on the 17th. She is taking K+ 47meq TID.  She reports her mom as without SOB, is able to lay flat/supine with only one pillow.   Discussed that if she does not have continued reduction in the swelling or if she developed any SOB she needs to come to the hospital.  Her daughter states understanding.  Tommye Standard, PA-C

## 2017-05-28 ENCOUNTER — Ambulatory Visit (INDEPENDENT_AMBULATORY_CARE_PROVIDER_SITE_OTHER): Payer: Medicare Other | Admitting: *Deleted

## 2017-05-28 ENCOUNTER — Other Ambulatory Visit: Payer: Medicare Other

## 2017-05-28 DIAGNOSIS — I5030 Unspecified diastolic (congestive) heart failure: Secondary | ICD-10-CM

## 2017-05-28 DIAGNOSIS — I4891 Unspecified atrial fibrillation: Secondary | ICD-10-CM

## 2017-05-28 DIAGNOSIS — Z5181 Encounter for therapeutic drug level monitoring: Secondary | ICD-10-CM | POA: Diagnosis not present

## 2017-05-28 DIAGNOSIS — Z79899 Other long term (current) drug therapy: Secondary | ICD-10-CM

## 2017-05-28 LAB — POCT INR: INR: 4.5

## 2017-05-29 DIAGNOSIS — S81812D Laceration without foreign body, left lower leg, subsequent encounter: Secondary | ICD-10-CM | POA: Diagnosis not present

## 2017-05-29 DIAGNOSIS — I4891 Unspecified atrial fibrillation: Secondary | ICD-10-CM | POA: Diagnosis not present

## 2017-05-29 DIAGNOSIS — S81811D Laceration without foreign body, right lower leg, subsequent encounter: Secondary | ICD-10-CM | POA: Diagnosis not present

## 2017-05-29 DIAGNOSIS — S51011D Laceration without foreign body of right elbow, subsequent encounter: Secondary | ICD-10-CM | POA: Diagnosis not present

## 2017-05-29 DIAGNOSIS — L97822 Non-pressure chronic ulcer of other part of left lower leg with fat layer exposed: Secondary | ICD-10-CM | POA: Diagnosis not present

## 2017-05-29 DIAGNOSIS — I872 Venous insufficiency (chronic) (peripheral): Secondary | ICD-10-CM | POA: Diagnosis not present

## 2017-05-29 LAB — BASIC METABOLIC PANEL
BUN/Creatinine Ratio: 23 (ref 12–28)
BUN: 20 mg/dL (ref 10–36)
CALCIUM: 8.8 mg/dL (ref 8.7–10.3)
CO2: 23 mmol/L (ref 20–29)
CREATININE: 0.88 mg/dL (ref 0.57–1.00)
Chloride: 101 mmol/L (ref 96–106)
GFR calc Af Amer: 64 mL/min/{1.73_m2} (ref >59)
GFR, EST NON AFRICAN AMERICAN: 56 mL/min/{1.73_m2} — AB (ref >59)
Glucose: 138 mg/dL — ABNORMAL HIGH (ref 65–99)
Potassium: 5.1 mmol/L (ref 3.5–5.2)
Sodium: 139 mmol/L (ref 134–144)

## 2017-05-30 DIAGNOSIS — Z872 Personal history of diseases of the skin and subcutaneous tissue: Secondary | ICD-10-CM | POA: Diagnosis not present

## 2017-05-30 DIAGNOSIS — S51011A Laceration without foreign body of right elbow, initial encounter: Secondary | ICD-10-CM | POA: Diagnosis not present

## 2017-05-30 DIAGNOSIS — S81811A Laceration without foreign body, right lower leg, initial encounter: Secondary | ICD-10-CM | POA: Diagnosis not present

## 2017-05-30 DIAGNOSIS — I2510 Atherosclerotic heart disease of native coronary artery without angina pectoris: Secondary | ICD-10-CM | POA: Diagnosis not present

## 2017-05-30 DIAGNOSIS — S81812A Laceration without foreign body, left lower leg, initial encounter: Secondary | ICD-10-CM | POA: Diagnosis not present

## 2017-05-30 DIAGNOSIS — L97829 Non-pressure chronic ulcer of other part of left lower leg with unspecified severity: Secondary | ICD-10-CM | POA: Diagnosis not present

## 2017-05-30 DIAGNOSIS — R609 Edema, unspecified: Secondary | ICD-10-CM | POA: Diagnosis not present

## 2017-05-31 ENCOUNTER — Telehealth: Payer: Self-pay | Admitting: Physician Assistant

## 2017-05-31 NOTE — Telephone Encounter (Signed)
New Message    Pt daughter is returning [place] call for test results

## 2017-06-04 ENCOUNTER — Ambulatory Visit (INDEPENDENT_AMBULATORY_CARE_PROVIDER_SITE_OTHER): Payer: Medicare Other | Admitting: *Deleted

## 2017-06-04 ENCOUNTER — Ambulatory Visit (INDEPENDENT_AMBULATORY_CARE_PROVIDER_SITE_OTHER): Payer: Medicare Other | Admitting: Physician Assistant

## 2017-06-04 VITALS — BP 121/65 | HR 70 | Ht <= 58 in | Wt 89.0 lb

## 2017-06-04 DIAGNOSIS — Z5181 Encounter for therapeutic drug level monitoring: Secondary | ICD-10-CM

## 2017-06-04 DIAGNOSIS — I10 Essential (primary) hypertension: Secondary | ICD-10-CM | POA: Diagnosis not present

## 2017-06-04 DIAGNOSIS — I4821 Permanent atrial fibrillation: Secondary | ICD-10-CM

## 2017-06-04 DIAGNOSIS — I4891 Unspecified atrial fibrillation: Secondary | ICD-10-CM

## 2017-06-04 DIAGNOSIS — Z950 Presence of cardiac pacemaker: Secondary | ICD-10-CM

## 2017-06-04 DIAGNOSIS — I5030 Unspecified diastolic (congestive) heart failure: Secondary | ICD-10-CM | POA: Diagnosis not present

## 2017-06-04 DIAGNOSIS — I482 Chronic atrial fibrillation: Secondary | ICD-10-CM

## 2017-06-04 LAB — POCT INR: INR: 1.9

## 2017-06-04 NOTE — Patient Instructions (Addendum)
Medication Instructions:   Your physician recommends that you continue on your current medications as directed. Please refer to the Current Medication list given to you today.  If you need a refill on your cardiac medications before your next appointment, please call your pharmacy.  Labwork: NONE ORDERED  TODAY    Testing/Procedures: NONE ORDERED  TODAY    Follow-Up: IN 2 WEEKS WITH RENEE ON SAME DAY AS COUMADIN APPT.   Any Other Special Instructions Will Be Listed Below (If Applicable).

## 2017-06-04 NOTE — Progress Notes (Signed)
Cardiology Office Note Date:  06/04/2017  Patient ID:  [name], [name] [birth year], MRN [id number] PCP:  Golden Circle, FNP  Cardiologist:  Dr. [name]   Chief Complaint: planned f/u to assess fluid status, response to medication changes  History of Present Illness: [name] is a 83 y.o. female with history of permanent AF s/p AVN ablation (x2) w/PPM, CAD, CBP, ostium secundum ASD repaired, HLD, venous insufficiency, HFpEF, HTN.  She comes in today to be seen for Dr. [name], last seen by him last month, she was noted to have fluid OL, as well as a component of orthostatic dizziness.  Her diuretic was increased temp with a goal of 5-6lbs redcution in weight, her amlodipine weaned off  as well.  She was seen by myself May 22, she was accompanied by her daughter, the patient quite hard of hearing, and helps provide history and information. They report that after the 5 days of 2 lasix pills each day she did get to 83lbs and her swelling much better, she has creeped back up to 86-87lbs by her home scale, and swelling has returned though not as much and her breathing remains much improved.  She developed last weekend an acute pain to her R shoulder and R arm swelling, she saw orthopedics Yesterday who did an ultrasound and found fluid, he aspirated the shoulder and said it appeared to be old blood and injected cortisone, the patient's daughter said he knew she was on the coumadin but did not suggest stopping it.  She has f/u in 3 weeks.  Her pain and ROM has improved and the swelling is improving with elevation.  (her INR today is 2.9).  She did not have any trauma, falls or bumps.  She apparently has "terrible shoulders" with no cartilage and torn rotator cuffs b/l.  She has not had any bleeding or signs of bleeding since.  The patient denies any kind of CP, no dizziness, near syncope or syncope.  She c/w the wound care clinic, for LLE wound, has unaboot currently LLE  She was seen in  conjunction with Dr. [name] 05/14/17.  She unfortunately had put the weight/water back on.  She is up 7 pounds, he goal or felt to be a dry weight for her is about 83, is 92 today.  She has not had any syncope, no dizziness.  She had an ER vsiit after a fall, says her legs gave out, definitely not a syncopal event.  She was also noted to have incidentally been found with a pneumonia and UTI, given these findings her PMD suspected she may have been dehydrated as well and encouraged PO intake/fluids, she had increased fluid accumulation and was instructed to reduce her PO fluids and monitor.  They haad reduced her fluid intake but she remains quite swollen.She denies SOB though.  No CP, palpitations, no dizziness, near syncope or syncope.  Her daughter states she asks at home if her breathing is OK and the patient always tells her yes, and never appears to be SOB or look winded.  She comes in again today with her daughter, they both have appreciated clear reduction in her edema, UE edema is resolved, abdominal bloating/edema is as well all but resolved and her appetite has picked up significantly and eating much better, she continues with significant LE edema to her thighs though not quite as tense.  Her weight initially came dow by a few pounds but went back up to a max of 89  when we resumed 23m daily of her lasix,  She had the again an initial response of a few pounds down but this has slowed, though not increasing, and is eating much better as well.  They are cautioned about salty foods though and report she avoids it.  No bleeding or signs of bleeding.   Device information: SJM dual chamber PPM programmed VVIR, implanted 1996, gen change 11/11/08, Dr. KRoselie Skinner s/p AVNode ablation  Past Medical History:  Diagnosis Date  . Anemia   . AV block    s/p PPM  . CAD (coronary artery disease)   . Chronic pain syndrome   . Diverticulosis of colon (without mention of hemorrhage)   . DJD (degenerative  joint disease)   . LBP (low back pain)   . Osteoporosis    pelvic fx 10/2013 s/p fall  . Ostium secundum type atrial septal defect   . Overactive bladder   . Pure hypercholesterolemia   . Venous insufficiency     Past Surgical History:  Procedure Laterality Date  . ASD REPAIR    . CATARACT EXTRACTION    . GANGLION CYST EXCISION    . INGUINAL HERNIA REPAIR  2011   bilaterally  . PACEMAKER PLACEMENT  1996   [employer]  . PARTIAL HYSTERECTOMY    . SHOULDER SURGERY     x 2  . VESICOVAGINAL FISTULA CLOSURE W/ TAH  1973    Current Outpatient Prescriptions  Medication Sig Dispense Refill  . acetaminophen (TYLENOL) 500 MG tablet Take 650 mg by mouth every 8 (eight) hours as needed. Take with tramadol as needed for pain    . Ascorbic Acid (VITAMIN C) 1000 MG tablet Take 1,000 mg by mouth daily.      . Biotin 1000 MCG tablet Take 1,000 mcg by mouth daily.      . Calcium Carbonate-Vitamin D (CALCIUM 600+D) 600-400 MG-UNIT per tablet Take 2 tablets by mouth daily.      . Cholecalciferol (VITAMIN D) 1000 UNITS capsule Take 1,000 Units by mouth daily.      . ferrous sulfate 325 (65 FE) MG tablet Take 325 mg by mouth daily with breakfast.     . furosemide (LASIX) 40 MG tablet Take 1 tablet (40 mg total) by mouth daily. TAKE   2 TABLETS  OF 40 MG  ( 80 MG)  EVERY TWO DAYS THEN TAKE ONE TABLET 40 MG ONCE A DAY ROTATING. 180 tablet 3  . furosemide (LASIX) 40 MG tablet TAKE   2 TABLETS  OF 40 MG  ( 80 MG)  EVERY TWO DAYS THEN TAKE ONE TABLET 40 MG ONCE A DAY ROTATING. 180 tablet 3  . hydrocortisone (ANUSOL-HC) 2.5 % rectal cream Apply rectally 2 times daily 28.35 g 0  . methylPREDNISolone (MEDROL DOSEPAK) 4 MG TBPK tablet Take as prescribed. 21 tablet 0  . Omega-3 350 MG CAPS Take 1 capsule by mouth every evening.     .Marland Kitchenomeprazole (PRILOSEC) 20 MG capsule Take 20 mg by mouth daily as needed (acid reflux).     . potassium chloride 20 MEQ/15ML (10%) SOLN Take 7.5 mLs (10 mEq total) by mouth 2 (two)  times daily. 450 mL 6  . tolterodine (DETROL) 2 MG tablet Take 2 mg by mouth 2 (two) times daily.    . traMADol (ULTRAM) 50 MG tablet Take 1 tablet (50 mg total) by mouth every 6 (six) hours as needed for moderate pain. 30 tablet 0  . vitamin A 8000  UNIT capsule Take 8,000 Units by mouth every other day.      . vitamin E 400 UNIT capsule Take 400 Units by mouth daily.      Marland Kitchen warfarin (COUMADIN) 5 MG tablet TAKE AS DIRECTED BY COUMADIN CLINIC 90 tablet 1   No current facility-administered medications for this visit.     Allergies:   Amiodarone hcl; Codeine; Fentanyl; Lamisil [terbinafine hcl]; and Other   Social History:  The patient  reports that she has never smoked. She has never used smokeless tobacco. She reports that she does not drink alcohol or use drugs.   Family History:  The patient's family history includes Breast cancer in her sister; Emphysema in her father; Heart disease in her mother; Heart failure in her mother.  ROS:  Please see the history of present illness.   All other systems are reviewed and otherwise negative.   PHYSICAL EXAM:  VS:  LMP  (LMP Unknown)  BMI: There is no height or weight on file to calculate BMI. Well nourished, though thin, very petite, somewhat frail apearing, in no acute distress  HEENT: normocephalic, atraumatic  Neck: no JVD, carotid bruits or masses Cardiac:  RRR; (paced) no significant murmurs, no rubs, or gallops Lungs: soft crackles at the  Right base resolved on left, no wheezing, rhonchi or rales  Abd: soft, nontender MS: no deformity , age appropriate atrophy Ext: B/l unaboots.  She is markedly edematous to upper thighs Skin: warm and dry, no rash Neuro:  No gross deficits appreciated Psych: euthymic mood, full affect  PPM site is stable (R side), no tethering or discomfort, very thin patient, no skin erosion is appreciated   EKG:  Done 02/26/17 was SR, V paced PPM interrogation done today by industry and reviewed by myself:  battery and lead measurements are stable, she is pacer dependent  RV lead had transient increased thresholds for 4 weeks feb/March time, though have returned to and maintained at baseline.  02/13/17: TTE Study Conclusions - Left ventricle: The cavity size was normal. Wall thickness was   normal. Systolic function was normal. The estimated ejection   fraction was in the range of 60% to 65%. Wall motion was normal;   there were no regional wall motion abnormalities. The study is   not technically sufficient to allow evaluation of LV diastolic   function. - Aortic valve: Mildly calcified leaflets. Mild stenosis. There was   mild regurgitation. Mean gradient (S): 9 mm Hg. Peak gradient   (S): 20 mm Hg. - Mitral valve: Calcified annulus. Mildly thickened leaflets .   There was moderate regurgitation. - Left atrium: Severely dilated. - Right ventricle: The cavity size was mildly dilated. Systolic   function is mildly reduced. - Right atrium: Severely dilated. The atrium was normal in size.   Pacer wire or catheter noted in right atrium. - Atrial septum: A septal defect cannot be excluded. - Tricuspid valve: There was moderate regurgitation. - Pulmonic valve: There was mild regurgitation. - Pulmonary arteries: PA peak pressure: 72 mm Hg (S). - Inferior vena cava: The vessel was dilated. The respirophasic   diameter changes were blunted (< 50%), consistent with elevated   central venous pressure. Impressions: - LVEF 60-65%, mild AS, mild AI, severe biatrial enlargment, MAC   with moderate MR, moderate TR, pacer leads noted, mild PI, RVSP   72 mmHg, dilated IVC.   Recent Labs: 02/13/2017: ALT 16; TSH 0.097 04/18/2017: Hemoglobin 12.6; Platelets 85 05/28/2017: BUN 20; Creatinine, Ser 0.88; Potassium  5.1; Sodium 139  No results found for requested labs within last 8760 hours.   CrCl cannot be calculated (Unknown ideal weight.).   Wt Readings from Last 3 Encounters:  05/14/17 92 lb (41.7 kg)   04/24/17 85 lb (38.6 kg)  04/02/17 89 lb (40.4 kg)     Other studies reviewed: Additional studies/records reviewed today include: summarized above  ASSESSMENT AND PLAN:  1. HFpEF   Slow improvement in her edema    She continues to delny SOB, mentioned 4 weeks ago started using 2 pillows though, feeling more comfortable, discussed at length if she is uncomfortable laying down with her usual 1 pillow, of of this continues to go on requiring further elevation of her head she needs to go to the hospital, or if she develops overt SOB, worsening swelling again, or does not continue to see improvement     renal function/BMET looked OK on 80m doseing, will have her back in 2 weeks for a visit and labs       2. HTN     looks OK, no changes  3. PPM     Stable findings, no changes  4. Permanent AFib     CHA2DS2Vasc is 5 on warfarin, monitored and managed with the coumadin clinic      Disposition: as above   Current medicines are reviewed at length with the patient today.  The patient did not have any concerns regarding medicines.  SHaywood Lasso PA-C 06/04/2017 5:13 AM     [facility] [address]SRand[place] [phone](office)  [phone](fax)

## 2017-06-05 DIAGNOSIS — S81812D Laceration without foreign body, left lower leg, subsequent encounter: Secondary | ICD-10-CM | POA: Diagnosis not present

## 2017-06-05 DIAGNOSIS — S81811D Laceration without foreign body, right lower leg, subsequent encounter: Secondary | ICD-10-CM | POA: Diagnosis not present

## 2017-06-05 DIAGNOSIS — I4891 Unspecified atrial fibrillation: Secondary | ICD-10-CM | POA: Diagnosis not present

## 2017-06-05 DIAGNOSIS — L97822 Non-pressure chronic ulcer of other part of left lower leg with fat layer exposed: Secondary | ICD-10-CM | POA: Diagnosis not present

## 2017-06-05 DIAGNOSIS — S51011D Laceration without foreign body of right elbow, subsequent encounter: Secondary | ICD-10-CM | POA: Diagnosis not present

## 2017-06-05 DIAGNOSIS — I872 Venous insufficiency (chronic) (peripheral): Secondary | ICD-10-CM | POA: Diagnosis not present

## 2017-06-06 DIAGNOSIS — I2510 Atherosclerotic heart disease of native coronary artery without angina pectoris: Secondary | ICD-10-CM | POA: Diagnosis not present

## 2017-06-06 DIAGNOSIS — R609 Edema, unspecified: Secondary | ICD-10-CM | POA: Diagnosis not present

## 2017-06-06 DIAGNOSIS — S81812A Laceration without foreign body, left lower leg, initial encounter: Secondary | ICD-10-CM | POA: Diagnosis not present

## 2017-06-06 DIAGNOSIS — S81811A Laceration without foreign body, right lower leg, initial encounter: Secondary | ICD-10-CM | POA: Diagnosis not present

## 2017-06-06 DIAGNOSIS — Z872 Personal history of diseases of the skin and subcutaneous tissue: Secondary | ICD-10-CM | POA: Diagnosis not present

## 2017-06-06 DIAGNOSIS — S51011A Laceration without foreign body of right elbow, initial encounter: Secondary | ICD-10-CM | POA: Diagnosis not present

## 2017-06-10 DIAGNOSIS — L97822 Non-pressure chronic ulcer of other part of left lower leg with fat layer exposed: Secondary | ICD-10-CM | POA: Diagnosis not present

## 2017-06-10 DIAGNOSIS — S81811D Laceration without foreign body, right lower leg, subsequent encounter: Secondary | ICD-10-CM | POA: Diagnosis not present

## 2017-06-10 DIAGNOSIS — S51011D Laceration without foreign body of right elbow, subsequent encounter: Secondary | ICD-10-CM | POA: Diagnosis not present

## 2017-06-10 DIAGNOSIS — I4891 Unspecified atrial fibrillation: Secondary | ICD-10-CM | POA: Diagnosis not present

## 2017-06-10 DIAGNOSIS — I872 Venous insufficiency (chronic) (peripheral): Secondary | ICD-10-CM | POA: Diagnosis not present

## 2017-06-10 DIAGNOSIS — S81812D Laceration without foreign body, left lower leg, subsequent encounter: Secondary | ICD-10-CM | POA: Diagnosis not present

## 2017-06-11 ENCOUNTER — Encounter (HOSPITAL_COMMUNITY): Payer: Self-pay | Admitting: Emergency Medicine

## 2017-06-11 ENCOUNTER — Inpatient Hospital Stay (HOSPITAL_COMMUNITY)
Admission: EM | Admit: 2017-06-11 | Discharge: 2017-07-13 | DRG: 314 | Disposition: E | Payer: Medicare Other | Attending: Internal Medicine | Admitting: Internal Medicine

## 2017-06-11 ENCOUNTER — Emergency Department (HOSPITAL_COMMUNITY): Payer: Medicare Other

## 2017-06-11 ENCOUNTER — Telehealth: Payer: Self-pay | Admitting: *Deleted

## 2017-06-11 DIAGNOSIS — I248 Other forms of acute ischemic heart disease: Secondary | ICD-10-CM | POA: Diagnosis present

## 2017-06-11 DIAGNOSIS — Z7189 Other specified counseling: Secondary | ICD-10-CM | POA: Diagnosis not present

## 2017-06-11 DIAGNOSIS — I492 Junctional premature depolarization: Secondary | ICD-10-CM | POA: Diagnosis not present

## 2017-06-11 DIAGNOSIS — I5033 Acute on chronic diastolic (congestive) heart failure: Secondary | ICD-10-CM | POA: Diagnosis not present

## 2017-06-11 DIAGNOSIS — R6520 Severe sepsis without septic shock: Secondary | ICD-10-CM | POA: Diagnosis present

## 2017-06-11 DIAGNOSIS — Z5181 Encounter for therapeutic drug level monitoring: Secondary | ICD-10-CM | POA: Diagnosis not present

## 2017-06-11 DIAGNOSIS — J189 Pneumonia, unspecified organism: Secondary | ICD-10-CM | POA: Diagnosis present

## 2017-06-11 DIAGNOSIS — M1990 Unspecified osteoarthritis, unspecified site: Secondary | ICD-10-CM | POA: Diagnosis present

## 2017-06-11 DIAGNOSIS — Z9849 Cataract extraction status, unspecified eye: Secondary | ICD-10-CM | POA: Diagnosis not present

## 2017-06-11 DIAGNOSIS — Y831 Surgical operation with implant of artificial internal device as the cause of abnormal reaction of the patient, or of later complication, without mention of misadventure at the time of the procedure: Secondary | ICD-10-CM | POA: Diagnosis present

## 2017-06-11 DIAGNOSIS — I482 Chronic atrial fibrillation: Secondary | ICD-10-CM | POA: Diagnosis not present

## 2017-06-11 DIAGNOSIS — I330 Acute and subacute infective endocarditis: Secondary | ICD-10-CM | POA: Diagnosis present

## 2017-06-11 DIAGNOSIS — M6281 Muscle weakness (generalized): Secondary | ICD-10-CM | POA: Diagnosis not present

## 2017-06-11 DIAGNOSIS — I38 Endocarditis, valve unspecified: Secondary | ICD-10-CM | POA: Diagnosis not present

## 2017-06-11 DIAGNOSIS — Z8774 Personal history of (corrected) congenital malformations of heart and circulatory system: Secondary | ICD-10-CM

## 2017-06-11 DIAGNOSIS — H9190 Unspecified hearing loss, unspecified ear: Secondary | ICD-10-CM | POA: Diagnosis present

## 2017-06-11 DIAGNOSIS — K5730 Diverticulosis of large intestine without perforation or abscess without bleeding: Secondary | ICD-10-CM | POA: Diagnosis present

## 2017-06-11 DIAGNOSIS — K219 Gastro-esophageal reflux disease without esophagitis: Secondary | ICD-10-CM | POA: Diagnosis present

## 2017-06-11 DIAGNOSIS — M810 Age-related osteoporosis without current pathological fracture: Secondary | ICD-10-CM | POA: Diagnosis present

## 2017-06-11 DIAGNOSIS — Z950 Presence of cardiac pacemaker: Secondary | ICD-10-CM | POA: Diagnosis not present

## 2017-06-11 DIAGNOSIS — N3001 Acute cystitis with hematuria: Secondary | ICD-10-CM

## 2017-06-11 DIAGNOSIS — M545 Low back pain, unspecified: Secondary | ICD-10-CM | POA: Diagnosis present

## 2017-06-11 DIAGNOSIS — I130 Hypertensive heart and chronic kidney disease with heart failure and stage 1 through stage 4 chronic kidney disease, or unspecified chronic kidney disease: Secondary | ICD-10-CM | POA: Diagnosis present

## 2017-06-11 DIAGNOSIS — Z66 Do not resuscitate: Secondary | ICD-10-CM | POA: Diagnosis not present

## 2017-06-11 DIAGNOSIS — T814XXD Infection following a procedure, subsequent encounter: Secondary | ICD-10-CM | POA: Diagnosis not present

## 2017-06-11 DIAGNOSIS — I469 Cardiac arrest, cause unspecified: Secondary | ICD-10-CM | POA: Diagnosis not present

## 2017-06-11 DIAGNOSIS — Z515 Encounter for palliative care: Secondary | ICD-10-CM

## 2017-06-11 DIAGNOSIS — R0902 Hypoxemia: Secondary | ICD-10-CM | POA: Diagnosis present

## 2017-06-11 DIAGNOSIS — G894 Chronic pain syndrome: Secondary | ICD-10-CM | POA: Diagnosis not present

## 2017-06-11 DIAGNOSIS — T827XXD Infection and inflammatory reaction due to other cardiac and vascular devices, implants and grafts, subsequent encounter: Secondary | ICD-10-CM | POA: Diagnosis not present

## 2017-06-11 DIAGNOSIS — I481 Persistent atrial fibrillation: Secondary | ICD-10-CM | POA: Diagnosis not present

## 2017-06-11 DIAGNOSIS — E785 Hyperlipidemia, unspecified: Secondary | ICD-10-CM | POA: Diagnosis present

## 2017-06-11 DIAGNOSIS — R4182 Altered mental status, unspecified: Secondary | ICD-10-CM

## 2017-06-11 DIAGNOSIS — Z885 Allergy status to narcotic agent status: Secondary | ICD-10-CM

## 2017-06-11 DIAGNOSIS — D696 Thrombocytopenia, unspecified: Secondary | ICD-10-CM

## 2017-06-11 DIAGNOSIS — R7881 Bacteremia: Secondary | ICD-10-CM

## 2017-06-11 DIAGNOSIS — Z8249 Family history of ischemic heart disease and other diseases of the circulatory system: Secondary | ICD-10-CM

## 2017-06-11 DIAGNOSIS — I2510 Atherosclerotic heart disease of native coronary artery without angina pectoris: Secondary | ICD-10-CM | POA: Diagnosis not present

## 2017-06-11 DIAGNOSIS — T814XXA Infection following a procedure, initial encounter: Secondary | ICD-10-CM | POA: Diagnosis not present

## 2017-06-11 DIAGNOSIS — T827XXA Infection and inflammatory reaction due to other cardiac and vascular devices, implants and grafts, initial encounter: Secondary | ICD-10-CM | POA: Diagnosis not present

## 2017-06-11 DIAGNOSIS — N179 Acute kidney failure, unspecified: Secondary | ICD-10-CM

## 2017-06-11 DIAGNOSIS — Z681 Body mass index (BMI) 19 or less, adult: Secondary | ICD-10-CM | POA: Diagnosis not present

## 2017-06-11 DIAGNOSIS — Z7901 Long term (current) use of anticoagulants: Secondary | ICD-10-CM | POA: Diagnosis not present

## 2017-06-11 DIAGNOSIS — I4821 Permanent atrial fibrillation: Secondary | ICD-10-CM | POA: Diagnosis present

## 2017-06-11 DIAGNOSIS — A4102 Sepsis due to Methicillin resistant Staphylococcus aureus: Secondary | ICD-10-CM | POA: Diagnosis present

## 2017-06-11 DIAGNOSIS — R531 Weakness: Secondary | ICD-10-CM | POA: Diagnosis not present

## 2017-06-11 DIAGNOSIS — R404 Transient alteration of awareness: Secondary | ICD-10-CM | POA: Diagnosis not present

## 2017-06-11 DIAGNOSIS — I083 Combined rheumatic disorders of mitral, aortic and tricuspid valves: Secondary | ICD-10-CM | POA: Diagnosis present

## 2017-06-11 DIAGNOSIS — I4891 Unspecified atrial fibrillation: Secondary | ICD-10-CM | POA: Diagnosis present

## 2017-06-11 DIAGNOSIS — I340 Nonrheumatic mitral (valve) insufficiency: Secondary | ICD-10-CM | POA: Diagnosis not present

## 2017-06-11 DIAGNOSIS — IMO0002 Reserved for concepts with insufficient information to code with codable children: Secondary | ICD-10-CM

## 2017-06-11 DIAGNOSIS — R8271 Bacteriuria: Secondary | ICD-10-CM | POA: Diagnosis present

## 2017-06-11 DIAGNOSIS — E7800 Pure hypercholesterolemia, unspecified: Secondary | ICD-10-CM | POA: Diagnosis present

## 2017-06-11 DIAGNOSIS — I272 Pulmonary hypertension, unspecified: Secondary | ICD-10-CM | POA: Diagnosis present

## 2017-06-11 DIAGNOSIS — R54 Age-related physical debility: Secondary | ICD-10-CM | POA: Diagnosis present

## 2017-06-11 DIAGNOSIS — E875 Hyperkalemia: Secondary | ICD-10-CM | POA: Diagnosis present

## 2017-06-11 DIAGNOSIS — R64 Cachexia: Secondary | ICD-10-CM | POA: Diagnosis present

## 2017-06-11 DIAGNOSIS — R8281 Pyuria: Secondary | ICD-10-CM

## 2017-06-11 DIAGNOSIS — R627 Adult failure to thrive: Secondary | ICD-10-CM | POA: Diagnosis present

## 2017-06-11 DIAGNOSIS — J9601 Acute respiratory failure with hypoxia: Secondary | ICD-10-CM | POA: Diagnosis present

## 2017-06-11 DIAGNOSIS — R0602 Shortness of breath: Secondary | ICD-10-CM | POA: Diagnosis not present

## 2017-06-11 DIAGNOSIS — B9562 Methicillin resistant Staphylococcus aureus infection as the cause of diseases classified elsewhere: Secondary | ICD-10-CM

## 2017-06-11 DIAGNOSIS — Z803 Family history of malignant neoplasm of breast: Secondary | ICD-10-CM

## 2017-06-11 DIAGNOSIS — Z825 Family history of asthma and other chronic lower respiratory diseases: Secondary | ICD-10-CM

## 2017-06-11 DIAGNOSIS — N183 Chronic kidney disease, stage 3 (moderate): Secondary | ICD-10-CM | POA: Diagnosis present

## 2017-06-11 DIAGNOSIS — N390 Urinary tract infection, site not specified: Secondary | ICD-10-CM | POA: Diagnosis not present

## 2017-06-11 DIAGNOSIS — G9341 Metabolic encephalopathy: Secondary | ICD-10-CM | POA: Diagnosis present

## 2017-06-11 DIAGNOSIS — I5031 Acute diastolic (congestive) heart failure: Secondary | ICD-10-CM

## 2017-06-11 DIAGNOSIS — Z888 Allergy status to other drugs, medicaments and biological substances status: Secondary | ICD-10-CM

## 2017-06-11 LAB — COMPREHENSIVE METABOLIC PANEL
ALT: 17 U/L (ref 14–54)
ANION GAP: 12 (ref 5–15)
AST: 82 U/L — ABNORMAL HIGH (ref 15–41)
Albumin: 3.3 g/dL — ABNORMAL LOW (ref 3.5–5.0)
Alkaline Phosphatase: 75 U/L (ref 38–126)
BUN: 24 mg/dL — ABNORMAL HIGH (ref 6–20)
CO2: 23 mmol/L (ref 22–32)
Calcium: 9.1 mg/dL (ref 8.9–10.3)
Chloride: 104 mmol/L (ref 101–111)
Creatinine, Ser: 1.32 mg/dL — ABNORMAL HIGH (ref 0.44–1.00)
GFR, EST AFRICAN AMERICAN: 38 mL/min — AB (ref >60)
GFR, EST NON AFRICAN AMERICAN: 33 mL/min — AB (ref >60)
Glucose, Bld: 89 mg/dL (ref 65–99)
Potassium: 5.2 mmol/L — ABNORMAL HIGH (ref 3.5–5.1)
Sodium: 139 mmol/L (ref 135–145)
Total Bilirubin: 1.6 mg/dL — ABNORMAL HIGH (ref 0.3–1.2)
Total Protein: 6.2 g/dL — ABNORMAL LOW (ref 6.5–8.1)

## 2017-06-11 LAB — CBC WITH DIFFERENTIAL/PLATELET
BASOS ABS: 0 10*3/uL (ref 0.0–0.1)
BASOS PCT: 0 %
EOS ABS: 0 10*3/uL (ref 0.0–0.7)
Eosinophils Relative: 0 %
HCT: 40.2 % (ref 36.0–46.0)
Hemoglobin: 13.2 g/dL (ref 12.0–15.0)
LYMPHS ABS: 0.2 10*3/uL — AB (ref 0.7–4.0)
Lymphocytes Relative: 2 %
MCH: 36 pg — ABNORMAL HIGH (ref 26.0–34.0)
MCHC: 32.8 g/dL (ref 30.0–36.0)
MCV: 109.5 fL — AB (ref 78.0–100.0)
Monocytes Absolute: 0.2 10*3/uL (ref 0.1–1.0)
Monocytes Relative: 2 %
NEUTROS ABS: 9.7 10*3/uL — AB (ref 1.7–7.7)
NEUTROS PCT: 96 %
PLATELETS: 59 10*3/uL — AB (ref 150–400)
RBC: 3.67 MIL/uL — ABNORMAL LOW (ref 3.87–5.11)
RDW: 18.1 % — AB (ref 11.5–15.5)
WBC: 10.1 10*3/uL (ref 4.0–10.5)

## 2017-06-11 LAB — URINALYSIS, ROUTINE W REFLEX MICROSCOPIC
BILIRUBIN URINE: NEGATIVE
GLUCOSE, UA: NEGATIVE mg/dL
KETONES UR: NEGATIVE mg/dL
Nitrite: NEGATIVE
PH: 5 (ref 5.0–8.0)
PROTEIN: 100 mg/dL — AB
Specific Gravity, Urine: 1.026 (ref 1.005–1.030)

## 2017-06-11 LAB — I-STAT TROPONIN, ED: Troponin i, poc: 0.21 ng/mL (ref 0.00–0.08)

## 2017-06-11 LAB — I-STAT CG4 LACTIC ACID, ED: Lactic Acid, Venous: 4.02 mmol/L (ref 0.5–1.9)

## 2017-06-11 MED ORDER — VITAMIN A 8000 UNITS PO CAPS: 8000.0000 [IU] | ORAL_CAPSULE | ORAL | Status: DC

## 2017-06-11 MED ORDER — VITAMIN E 180 MG (400 UNIT) PO CAPS
400.0000 [IU] | ORAL_CAPSULE | Freq: Every day | ORAL | Status: DC
  Administered 2017-06-12 – 2017-06-14 (×3): 400 [IU] via ORAL
  Filled 2017-06-11 (×3): qty 1

## 2017-06-11 MED ORDER — SODIUM CHLORIDE 0.9 % IV BOLUS (SEPSIS)
1000.0000 mL | Freq: Once | INTRAVENOUS | Status: AC
  Administered 2017-06-11: 1000 mL via INTRAVENOUS

## 2017-06-11 MED ORDER — FERROUS SULFATE 325 (65 FE) MG PO TABS
325.0000 mg | ORAL_TABLET | Freq: Every day | ORAL | Status: DC
  Administered 2017-06-12 – 2017-06-14 (×3): 325 mg via ORAL
  Filled 2017-06-11 (×3): qty 1

## 2017-06-11 MED ORDER — SODIUM CHLORIDE 0.9 % IV BOLUS (SEPSIS): 500.0000 mL | Freq: Once | INTRAVENOUS | Status: DC

## 2017-06-11 MED ORDER — VITAMIN D 1000 UNITS PO TABS
1000.0000 [IU] | ORAL_TABLET | Freq: Every day | ORAL | Status: DC
  Administered 2017-06-12 – 2017-06-14 (×3): 1000 [IU] via ORAL
  Filled 2017-06-11 (×3): qty 1

## 2017-06-11 MED ORDER — FUROSEMIDE 10 MG/ML IJ SOLN
80.0000 mg | Freq: Every day | INTRAMUSCULAR | Status: DC
Start: 2017-06-12 — End: 2017-06-12
  Administered 2017-06-12: 80 mg via INTRAVENOUS
  Filled 2017-06-11: qty 8

## 2017-06-11 MED ORDER — FUROSEMIDE 10 MG/ML IJ SOLN
INTRAMUSCULAR | Status: AC
  Filled 2017-06-11: qty 8

## 2017-06-11 MED ORDER — HYDROCORTISONE 2.5 % RE CREA
TOPICAL_CREAM | Freq: Two times a day (BID) | RECTAL | Status: DC
  Administered 2017-06-12: 22:00:00 via RECTAL
  Administered 2017-06-12: 1 via RECTAL
  Administered 2017-06-13 – 2017-06-14 (×3): via RECTAL
  Administered 2017-06-14: 1 via RECTAL
  Filled 2017-06-11 (×2): qty 28.35

## 2017-06-11 MED ORDER — IPRATROPIUM-ALBUTEROL 0.5-2.5 (3) MG/3ML IN SOLN
3.0000 mL | Freq: Once | RESPIRATORY_TRACT | Status: AC
  Administered 2017-06-11: 3 mL via RESPIRATORY_TRACT
  Filled 2017-06-11: qty 3

## 2017-06-11 MED ORDER — DEXTROSE 5 % IV SOLN
1.0000 g | Freq: Once | INTRAVENOUS | Status: AC
  Administered 2017-06-11: 1 g via INTRAVENOUS
  Filled 2017-06-11: qty 10

## 2017-06-11 MED ORDER — OMEGA-3-ACID ETHYL ESTERS 1 G PO CAPS
1.0000 g | ORAL_CAPSULE | Freq: Every evening | ORAL | Status: DC
  Administered 2017-06-12 – 2017-06-13 (×2): 1 g via ORAL
  Filled 2017-06-11 (×2): qty 1

## 2017-06-11 MED ORDER — TRAMADOL HCL 50 MG PO TABS
50.0000 mg | ORAL_TABLET | Freq: Four times a day (QID) | ORAL | Status: DC | PRN
  Administered 2017-06-12 – 2017-06-14 (×2): 50 mg via ORAL
  Filled 2017-06-11 (×2): qty 1

## 2017-06-11 MED ORDER — BIOTIN 1000 MCG PO TABS: 1000.0000 ug | ORAL_TABLET | Freq: Every day | ORAL | Status: DC

## 2017-06-11 MED ORDER — VITAMIN C 500 MG PO TABS
1000.0000 mg | ORAL_TABLET | Freq: Every day | ORAL | Status: DC
  Administered 2017-06-13 – 2017-06-14 (×2): 1000 mg via ORAL
  Filled 2017-06-11 (×2): qty 2

## 2017-06-11 MED ORDER — ACETAMINOPHEN 325 MG PO TABS
650.0000 mg | ORAL_TABLET | Freq: Three times a day (TID) | ORAL | Status: DC | PRN
  Administered 2017-06-12 – 2017-06-15 (×2): 650 mg via ORAL
  Filled 2017-06-11 (×3): qty 2

## 2017-06-11 MED ORDER — FUROSEMIDE 10 MG/ML IJ SOLN
80.0000 mg | Freq: Once | INTRAMUSCULAR | Status: AC
  Administered 2017-06-11: 80 mg via INTRAVENOUS

## 2017-06-11 MED ORDER — CEFTRIAXONE SODIUM 1 G IJ SOLR
1.0000 g | INTRAMUSCULAR | Status: DC
  Filled 2017-06-11: qty 10

## 2017-06-11 MED ORDER — CALCIUM CARBONATE-VITAMIN D 500-200 MG-UNIT PO TABS
2.0000 | ORAL_TABLET | Freq: Every day | ORAL | Status: DC
  Administered 2017-06-12 – 2017-06-14 (×2): 2 via ORAL
  Filled 2017-06-11 (×3): qty 2

## 2017-06-11 NOTE — H&P (Addendum)
History and Physical  [name] GUR:[id number] DOB: [birth year] DOA: 06/03/2017  Referring physician: EDP PCP: Golden Circle, FNP   Chief Complaint: feel bad, shaking all over, more confused  HPI: [name] is a 83 y.o. female   with history of HTN, CAD,  h/o ASD repaired, permanent AF s/p AVN ablation (x2) w/PPM, on chronic coumadin, h/o diastolic chf, h/o venous insufficiency , chronic leg wound presented to the ED due to above complaints. Daughter reports patient was recently treated for community acquired pneumonia, then got fluids overload. her cardiologist recently increased her lasix to 80mg  daily a month ago. Per chart review, she was just seen by cardiology on 7/24 with "OK"volume status and renal function. Daughter report patient weight still up about 1.5lbs (84.5lbs) from target weight (83lbs)   She is brought to the ED due to sob, weakness and confusion. Patient is not able to provide any history, daughter report she just state " she is feeling bad, she feels like she is going to die", daughter report no fever, no cough, no dysuria, no diarrhea, no chest pain, she has chronic legs wounds with dressings on both leg.   ED course: Patient on 2L of oxygen with EMS oxygen sat 94% on arrival, bp stable no fever. cxr with interim clearing right base infiltrate. Cardiomegaly with bibasilar atelectasis and small pleural effusions" . Wbc 10, platelet 59, k5.2, bun 24, cr 1.32 above baseline (0.88), total bilirubin elevated at 1.6, elevated ast, troponin 0.2, ekg with ventricular paced rhythm, not acute interval changes, ua concerns for UTI. Blood culture obtained, she is started on rocephin, she was given ns 1000cc, hospitalist called to admit the patient to med tele.  Shortly after I was paged for admission, patient became hypoxia, report feeling sob, she was put on NRB,  I have requested iv lasix to be given. Admission status changed to stepdown, ( daughter agreed to cpap /intubation  if needed)   Review of Systems:  Detail per HPI, Review of systems are otherwise negative  Past Medical History:  Diagnosis Date  . Anemia   . AV block    s/p PPM  . CAD (coronary artery disease)   . Chronic pain syndrome   . Diverticulosis of colon (without mention of hemorrhage)   . DJD (degenerative joint disease)   . LBP (low back pain)   . Osteoporosis    pelvic fx 10/2013 s/p fall  . Ostium secundum type atrial septal defect   . Overactive bladder   . Pure hypercholesterolemia   . Venous insufficiency    Past Surgical History:  Procedure Laterality Date  . ASD REPAIR    . CATARACT EXTRACTION    . GANGLION CYST EXCISION    . INGUINAL HERNIA REPAIR  2011   bilaterally  . PACEMAKER PLACEMENT  1996   [employer]  . PARTIAL HYSTERECTOMY    . SHOULDER SURGERY     x 2  . VESICOVAGINAL FISTULA CLOSURE W/ TAH  1973   Social History:  reports that she has never smoked. She has never used smokeless tobacco. She reports that she does not drink alcohol or use drugs. Patient lives at home with 24/7 care & is able to walk with a walker at baseline per family  Allergies  Allergen Reactions  . Amiodarone Hcl     REACTION: INTOL to Amiodarone in past  . Codeine Nausea And Vomiting  . Fentanyl     REACTION: causes nausea--dizziness  . Lamisil [Terbinafine  Hcl]     Causes nausea  . Other     Family History  Problem Relation Age of Onset  . Heart disease Mother   . Heart failure Mother   . Emphysema Father   . Breast cancer Sister   . Heart attack Neg Hx   . Stroke Neg Hx       Prior to Admission medications   Medication Sig Start Date End Date Taking? Authorizing Provider  acetaminophen (TYLENOL) 500 MG tablet Take 650 mg by mouth every 8 (eight) hours as needed. Take with tramadol as needed for pain   Yes [provider]  Ascorbic Acid (VITAMIN C) 1000 MG tablet Take 1,000 mg by mouth daily.     Yes [provider]  Biotin 1000 MCG tablet Take 1,000  mcg by mouth daily.     Yes [provider]  Calcium Carbonate-Vitamin D (CALCIUM 600+D) 600-400 MG-UNIT per tablet Take 2 tablets by mouth daily.     Yes [provider]  Cholecalciferol (VITAMIN D) 1000 UNITS capsule Take 1,000 Units by mouth daily.     Yes [provider]  ferrous sulfate 325 (65 FE) MG tablet Take 325 mg by mouth daily with breakfast.    Yes [provider]  furosemide (LASIX) 40 MG tablet Take 80 mg by mouth.   Yes [provider]  hydrocortisone (ANUSOL-HC) 2.5 % rectal cream Apply rectally 2 times daily 04/18/17  Yes Tegeler, Gwenyth Allegra, MD  Omega-3 350 MG CAPS Take 1 capsule by mouth every evening.    Yes [provider]  omeprazole (PRILOSEC) 20 MG capsule Take 20 mg by mouth daily as needed (acid reflux).    Yes [provider]  potassium chloride (K-DUR) 10 MEQ tablet Take 30 mEq by mouth daily.   Yes [provider]  tolterodine (DETROL) 2 MG tablet Take 2 mg by mouth 2 (two) times daily. 08/25/15  Yes [provider]  traMADol (ULTRAM) 50 MG tablet Take 1 tablet (50 mg total) by mouth every 6 (six) hours as needed for moderate pain. 10/25/13  Yes [name], MD  vitamin A 8000 UNIT capsule Take 8,000 Units by mouth every other day.     Yes [provider]  vitamin E 400 UNIT capsule Take 400 Units by mouth daily.     Yes [provider]  warfarin (COUMADIN) 5 MG tablet TAKE AS DIRECTED BY COUMADIN CLINIC 03/29/16  Yes [name], MD  methylPREDNISolone (MEDROL DOSEPAK) 4 MG TBPK tablet Take as prescribed. 04/24/17   Golden Circle, FNP    Physical Exam: BP (!) 104/56   Pulse 74   Temp 98.2 F (36.8 C) (Oral)   Resp (!) 25   Wt 40.8 kg (90 lb)   LMP  (LMP Unknown)   SpO2 97%   BMI 19.48 kg/m   General:  Very frail, cachectic ,very hard of hearing, dose not look comfortable Eyes: PERRL ENT: unremarkable Neck: supple, no JVD Cardiovascular: paced  rhythm Respiratory: bibasilar crackles, no wheezing, no rhonchi Abdomen: soft/ND/ND, positive bowel sounds Skin: no rash Musculoskeletal:  Bilateral leg wrapped  Psychiatric: calm/cooperative Neurologic: very hard of hearing, moving all extremities spontaneously           Labs on Admission:  Basic Metabolic Panel:  Recent Labs Lab 05/22/2017 1612  NA 139  K 5.2*  CL 104  CO2 23  GLUCOSE 89  BUN 24*  CREATININE 1.32*  CALCIUM 9.1   Liver  Function Tests:  Recent Labs Lab 06/10/2017 1612  AST 82*  ALT 17  ALKPHOS 75  BILITOT 1.6*  PROT 6.2*  ALBUMIN 3.3*   No results for input(s): LIPASE, AMYLASE in the last 168 hours. No results for input(s): AMMONIA in the last 168 hours. CBC:  Recent Labs Lab 05/24/2017 1612  WBC 10.1  NEUTROABS 9.7*  HGB 13.2  HCT 40.2  MCV 109.5*  PLT 59*   Cardiac Enzymes: No results for input(s): CKTOTAL, CKMB, CKMBINDEX, TROPONINI in the last 168 hours.  BNP (last 3 results) No results for input(s): BNP in the last 8760 hours.  ProBNP (last 3 results) No results for input(s): PROBNP in the last 8760 hours.  CBG: No results for input(s): GLUCAP in the last 168 hours.  Radiological Exams on Admission: Dg Chest 2 View  Result Date: 05/31/2017 CLINICAL DATA:  Shortness of breath . EXAM: CHEST  2 VIEW COMPARISON:  04/18/2017. FINDINGS: Cardiac pacer with lead tips in right atrium right ventricle. Stable cardiomegaly. Interval clearing of right base infiltrate. Persistent low lung volumes and bilateral pleural effusions. No pneumothorax . IMPRESSION: 1. Interim clearing of right base infiltrate. 2. Persistent bibasilar atelectasis and small pleural effusions. 3. Cardiac pacer stable position.  Stable cardiomegaly. Electronically Signed   By: Marcello Moores  Register   On: 05/29/2017 14:58      Assessment/Plan Present on Admission: . UTI (urinary tract infection) . Hypoxia . Acute hypoxemic respiratory failure (HCC)  Confusion, likely  Metabolic encephalopathy:  Family report patient is more confused than her baseline, hard to get any history from patient Expect improvement  Acute hypoxic respiratory failure:  Suspect from acute on chronic diastolic chf exacerbation, she received iv lasix 80mg  in the ED x1, admit to stepdown, may need cpap at night, continue iv lasix 80mg  daily, strict intake/output, daily weight Cardiology notified from epic in basket massaging.  UTI:  ua with many bacteria, wbc TNTC, RBC TNTC, denies back pain, denies dysuria ( per daughter) Urine culture and blood culture pending Continue rocephin which is started from the ED, f./u on culture result  AKI:  from uti vs heart failure Renal dosing meds, repeat bmp in am  Thrombocytopenia:  Acute on chronic, noted platelet started to trend down since 03/2017 From liver congestion?  From acute infection? Less likely MAHA, no mention of schistocytes, no recent heparin exposure ( has been on coumadin), will check fractionated bili Monitor, consider stop coumadin if plt less than 50. No acute bleeding   Mild elevation of lft, possibly from liver congestion Repeat in am  Mild hyperkalemia: k 5.2, she receive albuterol in the ED, given lasix, repeat in am  Chronic afib, s/p pacemaker, currently paced rhythm, not on rate control agent at home, on coumadin , check INR, ( INR was not checked in the ED) Pharmacy to dose coumadin  Chronic leg wounds, wound care consulted  FTT; has 24/7 care at home, likely will need home health  DVT prophylaxis: on coumadin  Consultants: none  Code Status: full , confirmed with daughter at bedside (  noticed DNR from previous hospitalization, daughter states she is not sure, for now patient is full code. daughter states will bring in patient's wills tomorrow)  Family Communication:  Patient and daughter at bedisde  Disposition Plan: admit to stepdown  Time spent: 71mins  Lavita Pontius MD, PhD [facility] Pager  [phone] If 7PM-7AM, please contact night-coverage at [email or web address], password [facility]

## 2017-06-11 NOTE — ED Notes (Signed)
Admitting ED  aware of respiratory distress

## 2017-06-11 NOTE — ED Provider Notes (Signed)
Medical screening examination/treatment/procedure(s) were conducted as a shared visit with non-physician practitioner(s) and myself.  I personally evaluated the patient during the encounter.   EKG Interpretation  Date/Time:  Tuesday June 11 2017 15:16:55 EDT Ventricular Rate:  70 PR Interval:    QRS Duration: 136 QT Interval:  445 QTC Calculation: 481 R Axis:   -96 Text Interpretation:  Accelerated junctional rhythm IVCD, consider atypical RBBB Anterolateral infarct, age indeterminate ventricularly paced rhythm similar to previous EKG  Confirmed by Brantley Stage [id number]) on 06/06/2017 3:31:36 PM      62 old female who presents with generalized weakness. History is provided by patient's daughter. She reports 3 days of generalized weakness, fatigue, confusion. Has been complaining of urinary frequency and urgency and mild shortness of breath. No chest pains.  In ED she is afebrile. She does have 2 L oxygen requirement. Evidence of UTI on urinalysis and we'll treat with ceftriaxone. Chest x-ray shows result infiltrate with some pleural effusions noted other acute cardiopulmonary processes. Given severity of symptoms plan to admit for ongoing treatment.   Forde Dandy, MD 05/24/2017 [phone]

## 2017-06-11 NOTE — ED Notes (Signed)
Admitting MD aware of respiratory  distress and oxygen sat

## 2017-06-11 NOTE — ED Provider Notes (Signed)
[facility] DEPT Provider Note   CSN: [id number] Arrival date & time: 06/02/2017  1312     History   Chief Complaint Chief Complaint  Patient presents with  . Weakness  . Shortness of Breath    HPI [name] is a 83 y.o. female.  HPI A 83 year old Caucasian female past medical history significant for dementia, CAD, presents to the ED today with family at bedside who reports generalized weakness over the past 3 days. Daughter states that over the past 3 days she has had generalized weakness, fatigue and confusion. Chest reports some urinary frequency and urgency with loose stools. Patient's daughter also reports some mild shortness of breath. Denies any chest pain. Patient has a history of chronic sores on her lower extremities that is currently being treated by wound management. The patient uses a walker at baseline is able to ambulate with assistance. Daughter states that she has not observed any fevers and patient.  Level 5 caveat due to dementia and altered mental status.    Past Medical History:  Diagnosis Date  . Anemia   . AV block    s/p PPM  . CAD (coronary artery disease)   . Chronic pain syndrome   . Diverticulosis of colon (without mention of hemorrhage)   . DJD (degenerative joint disease)   . LBP (low back pain)   . Osteoporosis    pelvic fx 10/2013 s/p fall  . Ostium secundum type atrial septal defect   . Overactive bladder   . Pure hypercholesterolemia   . Venous insufficiency     Patient Active Problem List   Diagnosis Date Noted  . Decreased hearing 04/24/2017  . UTI (urinary tract infection) 04/24/2017  . Community acquired pneumonia 04/24/2017  . Fall 04/24/2017  . Pill dysphagia 08/15/2016  . Globus sensation 08/01/2016  . Difficulty swallowing pills 05/30/2016  . Bilateral shoulder pain 05/30/2016  . Dizziness 05/30/2016  . Rib pain on left side 11/03/2015  . Wound of skin 10/07/2015  . Hx of completed stroke 10/05/2015  . Traumatic  hematoma 09/30/2015  . Permanent atrial fibrillation ([place]) 09/30/2015  . Leg hematoma 09/30/2015  . Cough   . Extrarenal azotemia 12/28/2014  . Hyponatremia 10/25/2013  . Fracture of multiple pubic rami ([place]) 10/23/2013  . GERD (gastroesophageal reflux disease) 12/29/2012  . Long term current use of anticoagulant 10/29/2011  . Stroke ([place]) 10/23/2011  . Pacemaker  single-chamber-St. Jude's 04/17/2011  . DIASTOLIC HEART FAILURE, CHRONIC 01/08/2011  . Atrial fibrillation ([place]) 12/26/2010  . ANEMIA 12/14/2010  . CAROTID ARTERY DISEASE 07/27/2010  . Chronic pain syndrome 01/23/2010  . CORONARY ARTERY DISEASE 01/15/2009  . ATRIAL SEPTAL DEFECT 01/15/2009  . AV BLOCK, COMPLETE 11/03/2008  . DIVERTICULOSIS OF COLON 07/17/2008  . OVERACTIVE BLADDER 07/17/2008  . DEGENERATIVE JOINT DISEASE 07/15/2008  . Osteoporosis 07/15/2008  . HYPERCHOLESTEROLEMIA 09/04/2007  . VENOUS INSUFFICIENCY 09/04/2007  . RESPIRATORY DISORDER, CHRONIC 09/04/2007  . BACK PAIN, LUMBAR 09/04/2007    Past Surgical History:  Procedure Laterality Date  . ASD REPAIR    . CATARACT EXTRACTION    . GANGLION CYST EXCISION    . INGUINAL HERNIA REPAIR  2011   bilaterally  . PACEMAKER PLACEMENT  1996   St Jude  . PARTIAL HYSTERECTOMY    . SHOULDER SURGERY     x 2  . VESICOVAGINAL FISTULA CLOSURE W/ TAH  1973    OB History    No data available       Home Medications  Prior to Admission medications   Medication Sig Start Date End Date Taking? Authorizing Provider  acetaminophen (TYLENOL) 500 MG tablet Take 650 mg by mouth every 8 (eight) hours as needed. Take with tramadol as needed for pain    [provider]  Ascorbic Acid (VITAMIN C) 1000 MG tablet Take 1,000 mg by mouth daily.      [provider]  Biotin 1000 MCG tablet Take 1,000 mcg by mouth daily.      [provider]  Calcium Carbonate-Vitamin D (CALCIUM 600+D) 600-400 MG-UNIT per tablet Take 2 tablets by mouth daily.       [provider]  Cholecalciferol (VITAMIN D) 1000 UNITS capsule Take 1,000 Units by mouth daily.      [provider]  ferrous sulfate 325 (65 FE) MG tablet Take 325 mg by mouth daily with breakfast.     [provider]  furosemide (LASIX) 40 MG tablet Take 80 mg by mouth.    [provider]  hydrocortisone (ANUSOL-HC) 2.5 % rectal cream Apply rectally 2 times daily 04/18/17   Tegeler, Gwenyth Allegra, MD  methylPREDNISolone (MEDROL DOSEPAK) 4 MG TBPK tablet Take as prescribed. 04/24/17   Golden Circle, FNP  Omega-3 350 MG CAPS Take 1 capsule by mouth every evening.     [provider]  omeprazole (PRILOSEC) 20 MG capsule Take 20 mg by mouth daily as needed (acid reflux).     [provider]  potassium chloride (K-DUR) 10 MEQ tablet Take 30 mEq by mouth daily.    [provider]  tolterodine (DETROL) 2 MG tablet Take 2 mg by mouth 2 (two) times daily. 08/25/15   [provider]  traMADol (ULTRAM) 50 MG tablet Take 1 tablet (50 mg total) by mouth every 6 (six) hours as needed for moderate pain. 10/25/13   [name], MD  vitamin A 8000 UNIT capsule Take 8,000 Units by mouth every other day.      [provider]  vitamin E 400 UNIT capsule Take 400 Units by mouth daily.      [provider]  warfarin (COUMADIN) 5 MG tablet TAKE AS DIRECTED BY COUMADIN CLINIC 03/29/16   [name], MD    Family History Family History  Problem Relation Age of Onset  . Heart disease Mother   . Heart failure Mother   . Emphysema Father   . Breast cancer Sister   . Heart attack Neg Hx   . Stroke Neg Hx     Social History Social History  Substance Use Topics  . Smoking status: Never Smoker  . Smokeless tobacco: Never Used  . Alcohol use No     Comment: social use     Allergies   Amiodarone hcl; Codeine; Fentanyl; Lamisil [terbinafine hcl]; and Other   Review of Systems Review of Systems  Unable to  perform ROS: Mental status change     Physical Exam Updated Vital Signs BP 108/87   Pulse 71   Temp 98.2 F (36.8 C) (Oral)   Resp (!) 22   Wt 40.8 kg (90 lb)   LMP  (LMP Unknown)   SpO2 98%   BMI 19.48 kg/m   Physical Exam  Constitutional: She appears well-developed and well-nourished.  Non-toxic appearance. No distress.  Patient is chronically ill-appearing. Very frail appearing.  HENT:  Head: Normocephalic and atraumatic.  Nose: Nose normal.  Mouth/Throat: Oropharynx is clear and moist.  Eyes: Pupils are equal, round, and reactive to light.  Conjunctivae are normal. Right eye exhibits no discharge. Left eye exhibits no discharge.  Neck: Normal range of motion. Neck supple.  Cardiovascular: Normal rate, regular rhythm, normal heart sounds and intact distal pulses.  Exam reveals no gallop and no friction rub.   No murmur heard. Pulmonary/Chest: Effort normal and breath sounds normal. No respiratory distress. She exhibits no tenderness.  Abdominal: Soft. Bowel sounds are normal. She exhibits no distension. There is no tenderness. There is no rebound and no guarding.  Musculoskeletal: Normal range of motion. She exhibits no tenderness.  Lymphadenopathy:    She has no cervical adenopathy.  Neurological: She is alert.  Oriented to person.  Skin: Skin is warm and dry. Capillary refill takes less than 2 seconds.  Bandages applied to the lower extremities bilaterally. No tenderness to palpation.  Psychiatric: Her behavior is normal. Judgment and thought content normal.  Nursing note and vitals reviewed.    ED Treatments / Results  Labs (all labs ordered are listed, but only abnormal results are displayed) Labs Reviewed  URINALYSIS, ROUTINE W REFLEX MICROSCOPIC - Abnormal; Notable for the following:       Result Value   Color, Urine AMBER (*)    APPearance CLOUDY (*)    Hgb urine dipstick LARGE (*)    Protein, ur 100 (*)    Leukocytes, UA LARGE (*)    Bacteria, UA MANY  (*)    Squamous Epithelial / LPF 0-5 (*)    Non Squamous Epithelial 0-5 (*)    All other components within normal limits  COMPREHENSIVE METABOLIC PANEL  CBC WITH DIFFERENTIAL/PLATELET  I-STAT TROPONIN, ED  I-STAT CG4 LACTIC ACID, ED    EKG  EKG Interpretation  Date/Time:  Tuesday June 11 2017 15:16:55 EDT Ventricular Rate:  70 PR Interval:    QRS Duration: 136 QT Interval:  445 QTC Calculation: 481 R Axis:   -96 Text Interpretation:  Accelerated junctional rhythm IVCD, consider atypical RBBB Anterolateral infarct, age indeterminate ventricularly paced rhythm similar to previous EKG  Confirmed by Brantley Stage [phone]) on 06/10/2017 3:31:36 PM       Radiology Dg Chest 2 View  Result Date: 05/29/2017 CLINICAL DATA:  Shortness of breath . EXAM: CHEST  2 VIEW COMPARISON:  04/18/2017. FINDINGS: Cardiac pacer with lead tips in right atrium right ventricle. Stable cardiomegaly. Interval clearing of right base infiltrate. Persistent low lung volumes and bilateral pleural effusions. No pneumothorax . IMPRESSION: 1. Interim clearing of right base infiltrate. 2. Persistent bibasilar atelectasis and small pleural effusions. 3. Cardiac pacer stable position.  Stable cardiomegaly. Electronically Signed   By: Marcello Moores  Register   On: 06/06/2017 14:58    Procedures Procedures (including critical care time)  Medications Ordered in ED Medications  cefTRIAXone (ROCEPHIN) 1 g in dextrose 5 % 50 mL IVPB (not administered)     Initial Impression / Assessment and Plan / ED Course  I have reviewed the triage vital signs and the nursing notes.  Pertinent labs & imaging results that were available during my care of the patient were reviewed by me and considered in my medical decision making (see chart for details).     Patient presents to the ED for complaints of increased fatigue, generalized weakness, urinary complaints reported by daughter for the past 3 days. Daughter also reports some altered  mental status. Patient was initially hypoxic at 88% on room air was placed on 2 L which improved to 97%. No history of oxygen at home. Vital signs are reassuring. She is  afebrile. No tachycardia or hypotension noted. Patient does not meet SIRS or SEPSIS  criteria. EKG was obtained that is similar to prior tracings.   Patient's lactic acid returned at 4.02. She does not have any other sepsis criteria however antibiotics were initiated with cultures along with IV fluids. Last EF 4 months ago with 65%.  UA with evidence of UTI and was started on ceftriaxone. Blood cultures obtained prior to antibiotic initiation. Chest x-ray does show a resolving infiltrate with some pleural effusion noted. Likely causing patient's hypoxia. Patient did have a troponin of 1.2. EKG shows no ischemic changes and unchanged from baseline. Patient denies any chest pain or shortness of breath. Doubt patient is having an acute MI. Likely did trend troponins. Likely elevated due to demand ischemia.   Spoke with hospital medicine who agrees to admit patient. He'll come to the ED to evaluate patient in place admission orders. Currently patient is hemodynamically stable in no acute distress. She had around 2 L of oxygen.  Patient seen by my attending Dr. Oleta Mouse who is agreeable to the above plan.   Final Clinical Impressions(s) / ED Diagnoses   Final diagnoses:  Generalized weakness  Altered mental status, unspecified altered mental status type  Acute cystitis with hematuria    New Prescriptions New Prescriptions   No medications on file     Aaron Edelman 06/06/2017 1736    Forde Dandy, MD 05/24/2017 [phone]

## 2017-06-11 NOTE — ED Triage Notes (Addendum)
Patient presents from home via [facility] complaining of SOB and weakness for a couple weeks and confusion per family. EMS reports chest congestion upon auscultation. Patient reports loss of appetite. Patient alert and oriented x 3. Not oriented to time. Patient on 2L of oxygen with EMS oxygen sat 94%. Patient also has a pacemaker.

## 2017-06-11 NOTE — ED Notes (Addendum)
Non re breather applied.

## 2017-06-11 NOTE — Telephone Encounter (Signed)
Dtr-Diane called to inform Anticoagulation Clinic that the pt in the Hospital being treated for a UTI & receiving Rocephin antibiotics at this time.  Advised that once she is discharged to call back & update with new meds & she verbalized understanding.

## 2017-06-11 NOTE — ED Notes (Signed)
EDP advised to only give 1 liter of fluid

## 2017-06-12 ENCOUNTER — Inpatient Hospital Stay (HOSPITAL_COMMUNITY): Payer: Medicare Other

## 2017-06-12 DIAGNOSIS — B9562 Methicillin resistant Staphylococcus aureus infection as the cause of diseases classified elsewhere: Secondary | ICD-10-CM

## 2017-06-12 DIAGNOSIS — T827XXA Infection and inflammatory reaction due to other cardiac and vascular devices, implants and grafts, initial encounter: Secondary | ICD-10-CM

## 2017-06-12 DIAGNOSIS — E7800 Pure hypercholesterolemia, unspecified: Secondary | ICD-10-CM

## 2017-06-12 DIAGNOSIS — R7881 Bacteremia: Secondary | ICD-10-CM

## 2017-06-12 DIAGNOSIS — D696 Thrombocytopenia, unspecified: Secondary | ICD-10-CM

## 2017-06-12 DIAGNOSIS — T814XXA Infection following a procedure, initial encounter: Secondary | ICD-10-CM

## 2017-06-12 DIAGNOSIS — I4891 Unspecified atrial fibrillation: Secondary | ICD-10-CM

## 2017-06-12 DIAGNOSIS — R531 Weakness: Secondary | ICD-10-CM

## 2017-06-12 DIAGNOSIS — I340 Nonrheumatic mitral (valve) insufficiency: Secondary | ICD-10-CM

## 2017-06-12 DIAGNOSIS — N179 Acute kidney failure, unspecified: Secondary | ICD-10-CM

## 2017-06-12 DIAGNOSIS — I5031 Acute diastolic (congestive) heart failure: Secondary | ICD-10-CM

## 2017-06-12 DIAGNOSIS — N390 Urinary tract infection, site not specified: Secondary | ICD-10-CM

## 2017-06-12 DIAGNOSIS — R0902 Hypoxemia: Secondary | ICD-10-CM

## 2017-06-12 DIAGNOSIS — I2510 Atherosclerotic heart disease of native coronary artery without angina pectoris: Secondary | ICD-10-CM

## 2017-06-12 DIAGNOSIS — R8281 Pyuria: Secondary | ICD-10-CM

## 2017-06-12 DIAGNOSIS — I482 Chronic atrial fibrillation: Secondary | ICD-10-CM

## 2017-06-12 DIAGNOSIS — R4182 Altered mental status, unspecified: Secondary | ICD-10-CM

## 2017-06-12 DIAGNOSIS — G894 Chronic pain syndrome: Secondary | ICD-10-CM

## 2017-06-12 DIAGNOSIS — J9601 Acute respiratory failure with hypoxia: Secondary | ICD-10-CM

## 2017-06-12 LAB — COMPREHENSIVE METABOLIC PANEL
ALBUMIN: 2.9 g/dL — AB (ref 3.5–5.0)
ALT: 19 U/L (ref 14–54)
AST: 83 U/L — ABNORMAL HIGH (ref 15–41)
Alkaline Phosphatase: 66 U/L (ref 38–126)
Anion gap: 11 (ref 5–15)
BUN: 28 mg/dL — ABNORMAL HIGH (ref 6–20)
CHLORIDE: 105 mmol/L (ref 101–111)
CO2: 21 mmol/L — AB (ref 22–32)
CREATININE: 1.36 mg/dL — AB (ref 0.44–1.00)
Calcium: 8.4 mg/dL — ABNORMAL LOW (ref 8.9–10.3)
GFR calc non Af Amer: 32 mL/min — ABNORMAL LOW (ref >60)
GFR, EST AFRICAN AMERICAN: 37 mL/min — AB (ref >60)
Glucose, Bld: 68 mg/dL (ref 65–99)
Potassium: 5 mmol/L (ref 3.5–5.1)
SODIUM: 137 mmol/L (ref 135–145)
Total Bilirubin: 1.5 mg/dL — ABNORMAL HIGH (ref 0.3–1.2)
Total Protein: 5.9 g/dL — ABNORMAL LOW (ref 6.5–8.1)

## 2017-06-12 LAB — PROTIME-INR
INR: 3.44
INR: 3.32
PROTHROMBIN TIME: 35.5 s — AB (ref 11.4–15.2)
Prothrombin Time: 34.5 seconds — ABNORMAL HIGH (ref 11.4–15.2)

## 2017-06-12 LAB — BLOOD CULTURE ID PANEL (REFLEXED)
ACINETOBACTER BAUMANNII: NOT DETECTED
Candida albicans: NOT DETECTED
Candida glabrata: NOT DETECTED
Candida krusei: NOT DETECTED
Candida parapsilosis: NOT DETECTED
Candida tropicalis: NOT DETECTED
ENTEROCOCCUS SPECIES: NOT DETECTED
Enterobacter cloacae complex: NOT DETECTED
Enterobacteriaceae species: NOT DETECTED
Escherichia coli: NOT DETECTED
HAEMOPHILUS INFLUENZAE: NOT DETECTED
Klebsiella oxytoca: NOT DETECTED
Klebsiella pneumoniae: NOT DETECTED
LISTERIA MONOCYTOGENES: NOT DETECTED
METHICILLIN RESISTANCE: DETECTED — AB
NEISSERIA MENINGITIDIS: NOT DETECTED
PSEUDOMONAS AERUGINOSA: NOT DETECTED
Proteus species: NOT DETECTED
SERRATIA MARCESCENS: NOT DETECTED
STAPHYLOCOCCUS AUREUS BCID: DETECTED — AB
Staphylococcus species: DETECTED — AB
Streptococcus agalactiae: NOT DETECTED
Streptococcus pneumoniae: NOT DETECTED
Streptococcus pyogenes: NOT DETECTED
Streptococcus species: NOT DETECTED

## 2017-06-12 LAB — MRSA PCR SCREENING: MRSA by PCR: NEGATIVE

## 2017-06-12 LAB — CBC
HCT: 40.4 % (ref 36.0–46.0)
HEMOGLOBIN: 13.4 g/dL (ref 12.0–15.0)
MCH: 36.1 pg — AB (ref 26.0–34.0)
MCHC: 33.2 g/dL (ref 30.0–36.0)
MCV: 108.9 fL — AB (ref 78.0–100.0)
PLATELETS: 50 10*3/uL — AB (ref 150–400)
RBC: 3.71 MIL/uL — AB (ref 3.87–5.11)
RDW: 18.3 % — ABNORMAL HIGH (ref 11.5–15.5)
WBC: 11.7 10*3/uL — ABNORMAL HIGH (ref 4.0–10.5)

## 2017-06-12 LAB — ECHOCARDIOGRAM COMPLETE: Weight: 1440 oz

## 2017-06-12 LAB — BILIRUBIN, DIRECT: Bilirubin, Direct: 0.7 mg/dL — ABNORMAL HIGH (ref 0.1–0.5)

## 2017-06-12 LAB — LACTIC ACID, PLASMA
LACTIC ACID, VENOUS: 1.8 mmol/L (ref 0.5–1.9)
Lactic Acid, Venous: 1.8 mmol/L (ref 0.5–1.9)

## 2017-06-12 MED ORDER — LORAZEPAM 0.5 MG PO TABS
0.2500 mg | ORAL_TABLET | Freq: Once | ORAL | Status: AC | PRN
  Administered 2017-06-12: 0.25 mg via ORAL
  Filled 2017-06-12: qty 1

## 2017-06-12 MED ORDER — ORAL CARE MOUTH RINSE
15.0000 mL | Freq: Two times a day (BID) | OROMUCOSAL | Status: DC
  Administered 2017-06-12 – 2017-06-16 (×5): 15 mL via OROMUCOSAL

## 2017-06-12 MED ORDER — SODIUM CHLORIDE 0.9 % IV BOLUS (SEPSIS): 500.0000 mL | Freq: Once | INTRAVENOUS | Status: DC

## 2017-06-12 MED ORDER — ACETAMINOPHEN 650 MG RE SUPP
650.0000 mg | RECTAL | Status: DC | PRN
  Administered 2017-06-12: 650 mg via RECTAL
  Filled 2017-06-12: qty 1

## 2017-06-12 MED ORDER — MORPHINE SULFATE (PF) 2 MG/ML IV SOLN
1.0000 mg | INTRAVENOUS | Status: DC | PRN
  Administered 2017-06-12: 1 mg via INTRAVENOUS
  Filled 2017-06-12: qty 1

## 2017-06-12 MED ORDER — LIDOCAINE 5 % EX PTCH
1.0000 | MEDICATED_PATCH | CUTANEOUS | Status: DC
  Administered 2017-06-12 – 2017-06-15 (×4): 1 via TRANSDERMAL
  Filled 2017-06-12 (×4): qty 1

## 2017-06-12 MED ORDER — VANCOMYCIN HCL 500 MG IV SOLR
500.0000 mg | INTRAVENOUS | Status: DC
  Filled 2017-06-12: qty 500

## 2017-06-12 MED ORDER — MORPHINE SULFATE (PF) 2 MG/ML IV SOLN
1.0000 mg | Freq: Once | INTRAVENOUS | Status: AC
  Administered 2017-06-12: 1 mg via INTRAVENOUS
  Filled 2017-06-12: qty 1

## 2017-06-12 MED ORDER — VANCOMYCIN HCL IN DEXTROSE 750-5 MG/150ML-% IV SOLN
750.0000 mg | Freq: Once | INTRAVENOUS | Status: AC
  Administered 2017-06-12: 750 mg via INTRAVENOUS
  Filled 2017-06-12 (×3): qty 150

## 2017-06-12 MED ORDER — WARFARIN - PHARMACIST DOSING INPATIENT: Freq: Every day | Status: DC

## 2017-06-12 NOTE — Consult Note (Addendum)
[facility] Nurse wound consult note Reason for Consult: Consult requested for bilat legs.  Daughter at the bedside is well informed regarding topical treatment; she states patient is followed by the outpatient wound care center and has periodic debridements; they have ordered a nonadherent dressing to left leg and collagen to the right leg, then 4 layer Profore compression wraps; these are changed 3 times a week.  Informed her that collagen dressings are not available in the [facility]; we can substitute Silver alginate and collagen can be resumed after discharge.  Daughter is in agreement with this plan of care.  Ordered Profore and will change dressings to BLE when supplies arrive.  Later note at 1100:  Wound type: Left outer calf with healing full thickness wound; dry red wound bed, no odor or drainage, 2X.2X.1cm.   Left posterior achillis area with linear dark purple red deep tissue injuries from previous leg wraps when they were removed; approx .2X2cm in 2 areas.   Left upper calf/lower knee with dark red, swollen area, approx 7X5cm with intact skin, no open wound or drainage, tender to touch. Right posterior knee area with healing full thickness wound; 3X3X.1cm, red dry wound bed, no odor, small amt tan drainage.  Right upper calf/lower knee with dark red, swollen area, approx 5X5cm with intact skin, no open wound or drainage, tender to touch. Right middle posterior calf with  dark purple red deep tissue injury from previous leg wraps when they were removed; approx .5X.5cm  Pressure injuries present on admission: Daughter at bedside to assess locations, Yes they were present on admission when the wraps were removed. Dressing procedure/placement/frequency: Float heels to reduce pressure. Continue present plan of care with nonadherent dressing to left leg and applied 4 layer Profore, then Aquacel and 4 later Profore wrap to right leg.  Will plan to change on Mon if still in the hospital at that  time.  She will paln to resume follow-up with the outpatient wound center after discharge.  Julien Girt MSN, RN, [place], [place], [place]

## 2017-06-12 NOTE — Evaluation (Signed)
Clinical/Bedside Swallow Evaluation Patient Details  Name: [name] MRN: [id number] Date of Birth: Dec 10, 1918  Today's Date: 06/12/2017 Time: SLP Start Time (ACUTE ONLY): 1287 SLP Stop Time (ACUTE ONLY): 1501 SLP Time Calculation (min) (ACUTE ONLY): 16 min  Past Medical History:  Past Medical History:  Diagnosis Date  . Anemia   . AV block    s/p PPM  . CAD (coronary artery disease)   . Chronic pain syndrome   . Diverticulosis of colon (without mention of hemorrhage)   . DJD (degenerative joint disease)   . LBP (low back pain)   . Osteoporosis    pelvic fx 10/2013 s/p fall  . Ostium secundum type atrial septal defect   . Overactive bladder   . Pure hypercholesterolemia   . Venous insufficiency    Past Surgical History:  Past Surgical History:  Procedure Laterality Date  . ASD REPAIR    . CATARACT EXTRACTION    . GANGLION CYST EXCISION    . INGUINAL HERNIA REPAIR  2011   bilaterally  . PACEMAKER PLACEMENT  1996   St Jude  . PARTIAL HYSTERECTOMY    . SHOULDER SURGERY     x 2  . VESICOVAGINAL FISTULA CLOSURE W/ TAH  1973   HPI:  83 y.o.femalewith history of HTN, CAD, ASD repaired, permanent AF s/p AVN ablation (x2) w/PPM, on chronic coumadin, h/o diastolic chf, h/ovenous insufficiency , chronic leg wound presented to the ED with confusion, weakness.  Dx includes UTI, acute hypoxic respiratory failure, AKI, FTT., acute on chronic diastolic heart failure    Assessment / Plan / Recommendation Clinical Impression  Pt presents with functional oropharyngeal swallow.  She continues to have general complaints of globus, and belches frequently with PO intake (consistent with MBS and barium swallow findings 10/2016).  However, mastication is effective; swallow does not elicit s/s of aspiration.  Pt is eager to eat and able to feed herself independently.  Recommend resuming a regular consistency diet, thin liquids; continue crushing meds with puree per recs from last year's  MBS.  No SLP f/u is warranted. Our services will sign off.  SLP Visit Diagnosis: Dysphagia, unspecified (R13.10)    Aspiration Risk  No limitations    Diet Recommendation   regular solids, thin liquids  Medication Administration: Crushed with puree    Other  Recommendations Oral Care Recommendations: Oral care BID   Follow up Recommendations None      Frequency and Duration            Prognosis        Swallow Study   General Date of Onset: 05/17/2017 HPI: 83 y.o.femalewith history of HTN, CAD, ASD repaired, permanent AF s/p AVN ablation (x2) w/PPM, on chronic coumadin, h/o diastolic chf, h/ovenous insufficiency , chronic leg wound presented to the ED with confusion, weakness.  Dx includes UTI, acute hypoxic respiratory failure, AKI, FTT., acute on chronic diastolic heart failure  Type of Study: Bedside Swallow Evaluation Previous Swallow Assessment: MBS 08/23/16: mild cervical esophageal dysphagia.  Esophagram 08/23/16: mild smooth narrowing of distal esophagus.  Diet Prior to this Study: NPO Temperature Spikes Noted: No Respiratory Status: Room air History of Recent Intubation: No Behavior/Cognition: Alert;Cooperative;Pleasant mood Oral Cavity Assessment: Within Functional Limits Oral Care Completed by SLP: No Oral Cavity - Dentition: Adequate natural dentition;Missing dentition Vision: Functional for self-feeding Self-Feeding Abilities: Able to feed self Patient Positioning: Upright in bed Baseline Vocal Quality: Normal Volitional Cough: Strong Volitional Swallow: Able to elicit    Oral/Motor/Sensory  Function Overall Oral Motor/Sensory Function: Within functional limits   Ice Chips Ice chips: Within functional limits   Thin Liquid Thin Liquid: Within functional limits    Nectar Thick Nectar Thick Liquid: Not tested   Honey Thick Honey Thick Liquid: Not tested   Puree Puree: Within functional limits   Solid   GO   Solid: Within functional limits         Juan Quam Laurice 06/12/2017,3:16 PM

## 2017-06-12 NOTE — Progress Notes (Signed)
Per family Patient does not wear CPAP at home.

## 2017-06-12 NOTE — Progress Notes (Signed)
ANTICOAGULATION CONSULT NOTE - Initial Consult  Pharmacy Consult for Warfarin  Indication: atrial fibrillation  Allergies  Allergen Reactions  . Amiodarone Hcl     REACTION: INTOL to Amiodarone in past  . Codeine Nausea And Vomiting  . Fentanyl     REACTION: causes nausea--dizziness  . Lamisil [Terbinafine Hcl]     Causes nausea  . Other    Patient Measurements: Weight: 90 lb (40.8 kg)  Vital Signs: Temp: 98.3 F (36.8 C) (08/01 0202) Temp Source: Oral (08/01 0202) BP: 128/51 (08/01 0202) Pulse Rate: 70 (08/01 0202)  Labs:  Recent Labs  05/26/2017 1612 06/12/17 0125  HGB 13.2  --   HCT 40.2  --   PLT 59*  --   LABPROT  --  35.5*  INR  --  3.44  CREATININE 1.32*  --     Estimated Creatinine Clearance: 15.2 mL/min (A) (by C-G formula based on SCr of 1.32 mg/dL (H)).   Medical History: Past Medical History:  Diagnosis Date  . Anemia   . AV block    s/p PPM  . CAD (coronary artery disease)   . Chronic pain syndrome   . Diverticulosis of colon (without mention of hemorrhage)   . DJD (degenerative joint disease)   . LBP (low back pain)   . Osteoporosis    pelvic fx 10/2013 s/p fall  . Ostium secundum type atrial septal defect   . Overactive bladder   . Pure hypercholesterolemia   . Venous insufficiency     Assessment: 83 y/o F here with shortness of breath and confusion, continuing warfarin PTA for afib, INR is supra-therapeutic at 3.44 on admit (lower goal of 1.8-2.4 per outpatient anti-coag notes). Hgb 13.2, plts low at 59.   Goal of Therapy:  INR 1.8-2.4 (per outpatient anti-coag notes) Monitor platelets by anticoagulation protocol: Yes   Plan:  -No warfarin tonight  -Daily PT/INR -Re-start warfarin as INR allows  Narda Bonds 06/12/2017,2:18 AM

## 2017-06-12 NOTE — Consult Note (Signed)
Cardiology Consult    Patient ID: [name] MRN: [id number], DOB/AGE: [birth year]   Admit date: 05/30/2017 Date of Consult: 06/12/2017  Primary Physician: Golden Circle, FNP Primary Cardiologist: Dr. [name]  Requesting Provider: Dr. Alfredia Ferguson  Reason for Consult: heart failure  Patient Profile    [name] has a PMH significant for HTN, HLD, CAD, hx of ASD repair, permanent atrial fibrillation s/p AVN ablation x 2 and PPM  Placement (placed 1996 with generator change 10/2008, on coumadin, hx of diastolic heart failure, hx of venous insufficiency, and chronic leg wounds. She was admitted with UTI and was given IVF in ED with resultant SOB.  [name] is a 82 y.o. female who is being seen today for the evaluation of acute on chronic diastolic heart failure at the request of Dr. Alfredia Ferguson.   Past Medical History   Past Medical History:  Diagnosis Date  . Anemia   . AV block    s/p PPM  . CAD (coronary artery disease)   . Chronic pain syndrome   . Diverticulosis of colon (without mention of hemorrhage)   . DJD (degenerative joint disease)   . LBP (low back pain)   . Osteoporosis    pelvic fx 10/2013 s/p fall  . Ostium secundum type atrial septal defect   . Overactive bladder   . Pure hypercholesterolemia   . Venous insufficiency     Past Surgical History:  Procedure Laterality Date  . ASD REPAIR    . CATARACT EXTRACTION    . GANGLION CYST EXCISION    . INGUINAL HERNIA REPAIR  2011   bilaterally  . PACEMAKER PLACEMENT  1996   [employer]  . PARTIAL HYSTERECTOMY    . SHOULDER SURGERY     x 2  . VESICOVAGINAL FISTULA CLOSURE W/ TAH  1973     Allergies  Allergies  Allergen Reactions  . Amiodarone Hcl     REACTION: INTOL to Amiodarone in past  . Codeine Nausea And Vomiting  . Fentanyl     REACTION: causes nausea--dizziness  . Lamisil [Terbinafine Hcl]     Causes nausea  . Other     History of Present Illness   Ms. [name] is known to this service and  last saw [name] [facility] in clinic on 06/04/17. At that time, her diuretics were being managed and she had decreased upper extremity edema on her current regimen. She recently started using 2 pillows at night. She was instructed to report to the ED if she felt SOB or couldn't lay down.  Majority of history is gathered from [place] and daughter at bedside. She was brought to the [facility] via EMS on 06/02/2017 with symptoms of UTI and diarrhea. At home, she went to the bathroom several times the morning of 05/24/2017 and became very weak. She told her daughter that she thought she was going to die. She was given IVF in the ED which resulted in profound SOB. She is now on supplemental oxygen and IV ABX. She was also found to have gram positive cocci in her blood cultures (final pending). She has several skin tears on her lower extremities, which are wrapped for protection and edema. She is pacer-dependent. She has not complained about chest pain, palpitations, falls, dizziness, and shortness of breath. She has recently started using 2 pillows at night (past 3 weeks). Her dry weight is near 84 lbs.    Inpatient Medications    . calcium-vitamin D  2 tablet Oral  Daily  . cholecalciferol  1,000 Units Oral Daily  . ferrous sulfate  325 mg Oral Q breakfast  . furosemide  80 mg Intravenous Daily  . hydrocortisone   Rectal BID  . lidocaine  1 patch Transdermal Q24H  . mouth rinse  15 mL Mouth Rinse BID  . omega-3 acid ethyl esters  1 g Oral QPM  . vitamin C  1,000 mg Oral Daily  . vitamin E  400 Units Oral Daily  . Warfarin - Pharmacist Dosing Inpatient   Does not apply q1800     Outpatient Medications    Prior to Admission medications   Medication Sig Start Date End Date Taking? Authorizing Provider  acetaminophen (TYLENOL) 500 MG tablet Take 650 mg by mouth every 8 (eight) hours as needed. Take with tramadol as needed for pain   Yes [provider]  Ascorbic Acid (VITAMIN C) 1000 MG tablet Take 1,000 mg  by mouth daily.     Yes [provider]  Biotin 1000 MCG tablet Take 1,000 mcg by mouth daily.     Yes [provider]  Calcium Carbonate-Vitamin D (CALCIUM 600+D) 600-400 MG-UNIT per tablet Take 2 tablets by mouth daily.     Yes [provider]  Cholecalciferol (VITAMIN D) 1000 UNITS capsule Take 1,000 Units by mouth daily.     Yes [provider]  ferrous sulfate 325 (65 FE) MG tablet Take 325 mg by mouth daily with breakfast.    Yes [provider]  furosemide (LASIX) 40 MG tablet Take 80 mg by mouth.   Yes [provider]  hydrocortisone (ANUSOL-HC) 2.5 % rectal cream Apply rectally 2 times daily 04/18/17  Yes Tegeler, Gwenyth Allegra, MD  Omega-3 350 MG CAPS Take 1 capsule by mouth every evening.    Yes [provider]  omeprazole (PRILOSEC) 20 MG capsule Take 20 mg by mouth daily as needed (acid reflux).    Yes [provider]  potassium chloride (K-DUR) 10 MEQ tablet Take 30 mEq by mouth daily.   Yes [provider]  tolterodine (DETROL) 2 MG tablet Take 2 mg by mouth 2 (two) times daily. 08/25/15  Yes [provider]  traMADol (ULTRAM) 50 MG tablet Take 1 tablet (50 mg total) by mouth every 6 (six) hours as needed for moderate pain. 10/25/13  Yes [name], MD  vitamin A 8000 UNIT capsule Take 8,000 Units by mouth every other day.     Yes [provider]  vitamin E 400 UNIT capsule Take 400 Units by mouth daily.     Yes [provider]  warfarin (COUMADIN) 5 MG tablet TAKE AS DIRECTED BY COUMADIN CLINIC 03/29/16  Yes [name], MD  methylPREDNISolone (MEDROL DOSEPAK) 4 MG TBPK tablet Take as prescribed. 04/24/17   Golden Circle, FNP     Family History     Family History  Problem Relation Age of Onset  . Heart disease Mother   . Heart failure Mother   . Emphysema Father   . Breast cancer Sister   . Heart attack Neg Hx   . Stroke Neg Hx     Social History      Social History   Social History  . Marital status: Widowed    Spouse name: N/A  . Number of children: 2  . Years of education: N/A   Occupational History  . Retired   .  Retired   Social History Main Topics  . Smoking status: Never  Smoker  . Smokeless tobacco: Never Used  . Alcohol use No     Comment: social use  . Drug use: No  . Sexual activity: No   Other Topics Concern  . Not on file   Social History Narrative   Lives with daughter    Daughter works in day      Review of Systems    General:  No chills, fever, night sweats or weight changes.  Cardiovascular:  No chest pain, + dyspnea on exertion, + edema, + orthopnea, no palpitations, paroxysmal nocturnal dyspnea. Dermatological: No rash, lesions/masses Respiratory: No cough, dyspnea Urologic: No hematuria, dysuria Abdominal:   No nausea, vomiting, + diarrhea, no bright red blood per rectum, melena, or hematemesis Neurologic:  No visual changes, + wkns, no changes in mental status. All other systems reviewed and are otherwise negative except as noted above.  Physical Exam    Blood pressure (!) 95/44, pulse 74, temperature 98.4 F (36.9 C), temperature source Oral, resp. rate 16, weight 90 lb (40.8 kg), SpO2 100 %.  General: Pleasant, NAD Psych: Normal affect. Neuro: Alert and oriented X 3. Moves all extremities spontaneously. HEENT: Normal  Neck: Supple without bruits or JVD. Lungs:  Resp regular and unlabored, CTA in upper lobes, diminished in bases Heart: RRR no murmurs. Abdomen: Soft, non-tender, non-distended, BS + x 4.  Extremities: No clubbing, cyanosis, trace edema. Both lower extremities wrapped  Labs    Troponin (Point of Care Test)  Recent Labs  05/31/2017 1639  TROPIPOC 0.21*   No results for input(s): CKTOTAL, CKMB, TROPONINI in the last 72 hours. Lab Results  Component Value Date   WBC 11.7 (H) 06/12/2017   HGB 13.4 06/12/2017   HCT 40.4 06/12/2017   MCV 108.9 (H) 06/12/2017   PLT  50 (L) 06/12/2017    Recent Labs Lab 06/12/17 0314  NA 137  K 5.0  CL 105  CO2 21*  BUN 28*  CREATININE 1.36*  CALCIUM 8.4*  PROT 5.9*  BILITOT 1.5*  ALKPHOS 66  ALT 19  AST 83*  GLUCOSE 68   Lab Results  Component Value Date   CHOL 154 06/14/2014   HDL 53.50 06/14/2014   LDLCALC 84 06/14/2014   TRIG 84.0 06/14/2014   No results found for: [facility]   Radiology Studies    Dg Chest 2 View  Result Date: 05/12/2017 CLINICAL DATA:  Shortness of breath . EXAM: CHEST  2 VIEW COMPARISON:  04/18/2017. FINDINGS: Cardiac pacer with lead tips in right atrium right ventricle. Stable cardiomegaly. Interval clearing of right base infiltrate. Persistent low lung volumes and bilateral pleural effusions. No pneumothorax . IMPRESSION: 1. Interim clearing of right base infiltrate. 2. Persistent bibasilar atelectasis and small pleural effusions. 3. Cardiac pacer stable position.  Stable cardiomegaly. Electronically Signed   By: Marcello Moores  Register   On: 06/09/2017 14:58    ECG & Cardiac Imaging    EKG 06/12/17: V-paced  Echocardiogram 06/12/17: pending  Echocardiogram 02/13/17: Study Conclusions - Left ventricle: The cavity size was normal. Wall thickness was   normal. Systolic function was normal. The estimated ejection   fraction was in the range of 60% to 65%. Wall motion was normal;   there were no regional wall motion abnormalities. The study is   not technically sufficient to allow evaluation of LV diastolic   function. - Aortic valve: Mildly calcified leaflets. Mild stenosis. There was   mild regurgitation. Mean gradient (S): 9 mm Hg. Peak gradient   (S): 20 mm  Hg. - Mitral valve: Calcified annulus. Mildly thickened leaflets .   There was moderate regurgitation. - Left atrium: Severely dilated. - Right ventricle: The cavity size was mildly dilated. Systolic   function is mildly reduced. - Right atrium: Severely dilated. The atrium was normal in size.   Pacer wire or catheter noted in  right atrium. - Atrial septum: A septal defect cannot be excluded. - Tricuspid valve: There was moderate regurgitation. - Pulmonic valve: There was mild regurgitation. - Pulmonary arteries: PA peak pressure: 72 mm Hg (S). - Inferior vena cava: The vessel was dilated. The respirophasic   diameter changes were blunted (< 50%), consistent with elevated   central venous pressure.  Impressions: - LVEF 60-65%, mild AS, mild AI, severe biatrial enlargment, MAC   with moderate MR, moderate TR, pacer leads noted, mild PI, RVSP   72 mmHg, dilated IVC.  Assessment & Plan    1. Acute on chronic diastolic heart failure - she states she recently started using 2 pillows at night - she has been compliant on her current cardiac medications, including 80 mg PO lasix BID - recent CAP infection with accompanying fluids may have contributed to volume overload leading to increased lasix regimen at last clinic visit - she was restarted on 80 mg IV lasix daily - per family, dry weight is 84 lbs, she was 90 lbs on admission, it is unclear if this is a bed weight - recommend lasix for now since she in NPO awaiting swallow study - she is overall net positive 50cc; however, I&Os not strictly recorded   2. Permanent Atrial fibrillation s/p AVN ablation x 2 with PPM - PPM working properly 06/04/17, she is pacer-dependent - This patients CHA2DS2-VASc Score and unadjusted Ischemic Stroke Rate (% per year) is equal to 7.2 % stroke rate/year from a score of 5 (CHF, HTN, age, female) - continue coumadin - per daughter, has had three falls in the past 12 months, but without major injury   3. Thrombocytopenia - PLT 50 (59 on admission) - this appears to be a chronic problem for her, PLT 85 on 04/18/17 and 105 on 04/02/17 - per primary team   4. Acute on chronic kidney injury stage III -sCr 1.36, baseline appears to be 0.77-0.88 - per primary team - we will switch to PO lasix while she is NPO   Signed, Ledora Bottcher, PA-C 06/12/2017, 12:43 PM [phone]  Patient seen and independently examined with Fabian Sharp, PA. We discussed all aspects of the encounter. I agree with the assessment and plan as stated above.  Patient with history of permanent atrial fibrillation s/p AVN ablation x 2 with PPM, chronic diastolic CHF, CKD, chronic thrombocytopenia who recently was treated for PNA and now admitted with urosepsis and SOB.  The patient has recently been sleeping on 2 pillows but when you ask her if she has been SOB she says no. She denies any chest pain.  She received IVF with her PNA and then was seen by our office and was volume overloaded and her Lasix was increased. Her PCP saw her and thought she was dehydrated and stopped her Lasix and then starting having SOB at night and requiring more pillows to sleep on.  On exam today she is minimally volume overloaded with mild LE edema but is also sedentary and likely chronic venous insuff.  Her creatinine is mildly elevated at 1.36.  Chest xray does not show frank CHF but very small pleural effusions.  Her plt  ct is down to 50K and remains on coumadin.  She is very fraile and elderly and I'm not sure the benefits of ongoing anticoagulation outweigth the risks of bleeding especially with recent falls over the past year.  2D echo pending.  Will hold diuretics for now as Creatinine trending upward and she is NPO.   Signed: Fransico Him, MD [facility] 06/12/2017

## 2017-06-12 NOTE — Progress Notes (Signed)
Pharmacy Antibiotic Note  [name] is a 83 y.o. female admitted on 05/21/2017 with general malaise, shaking, confusion. Now with  bacteremia. Pharmacy has been consulted for vancomycin dosing.  Tmax 101.4, wbc 11, blood cultures positive for GPC. Scr elevated over baseline to 1.3.  Plan: Vancomycin 750mg  IV load now then 500mg   IV every 48  hours.  Goal trough 15-20 mcg/mL.  Consider checking trough 8/3 prior to redosing if scr worsens  Weight: 90 lb (40.8 kg)  Temp (24hrs), Avg:98.9 F (37.2 C), Min:98.1 F (36.7 C), Max:101.4 F (38.6 C)   Recent Labs Lab 06/04/2017 1612 06/10/2017 1641 06/12/17 0314  WBC 10.1  --  11.7*  CREATININE 1.32*  --  1.36*  LATICACIDVEN  --  4.02*  --     Estimated Creatinine Clearance: 14.7 mL/min (A) (by C-G formula based on SCr of 1.36 mg/dL (H)).    Allergies  Allergen Reactions  . Amiodarone Hcl     REACTION: INTOL to Amiodarone in past  . Codeine Nausea And Vomiting  . Fentanyl     REACTION: causes nausea--dizziness  . Lamisil [Terbinafine Hcl]     Causes nausea  . Other     Antimicrobials this admission: Ceftriaxone 7/31>> Vancomycin 8/1>>   Microbiology results: 7/31 BCx: GPC  Thank you for allowing pharmacy to be a part of this patient's care.  Erin Hearing PharmD., BCPS Clinical Pharmacist Pager [phone] 06/12/2017 9:39 AM

## 2017-06-12 NOTE — Progress Notes (Signed)
PHARMACY - PHYSICIAN COMMUNICATION CRITICAL VALUE ALERT - BLOOD CULTURE IDENTIFICATION (BCID)  Results for orders placed or performed during the hospital encounter of 05/16/2017  Blood Culture ID Panel (Reflexed) (Collected: 06/09/2017  4:12 PM)  Result Value Ref Range   Enterococcus species NOT DETECTED NOT DETECTED   Listeria monocytogenes NOT DETECTED NOT DETECTED   Staphylococcus species DETECTED (A) NOT DETECTED   Staphylococcus aureus DETECTED (A) NOT DETECTED   Methicillin resistance DETECTED (A) NOT DETECTED   Streptococcus species NOT DETECTED NOT DETECTED   Streptococcus agalactiae NOT DETECTED NOT DETECTED   Streptococcus pneumoniae NOT DETECTED NOT DETECTED   Streptococcus pyogenes NOT DETECTED NOT DETECTED   Acinetobacter baumannii NOT DETECTED NOT DETECTED   Enterobacteriaceae species NOT DETECTED NOT DETECTED   Enterobacter cloacae complex NOT DETECTED NOT DETECTED   Escherichia coli NOT DETECTED NOT DETECTED   Klebsiella oxytoca NOT DETECTED NOT DETECTED   Klebsiella pneumoniae NOT DETECTED NOT DETECTED   Proteus species NOT DETECTED NOT DETECTED   Serratia marcescens NOT DETECTED NOT DETECTED   Haemophilus influenzae NOT DETECTED NOT DETECTED   Neisseria meningitidis NOT DETECTED NOT DETECTED   Pseudomonas aeruginosa NOT DETECTED NOT DETECTED   Candida albicans NOT DETECTED NOT DETECTED   Candida glabrata NOT DETECTED NOT DETECTED   Candida krusei NOT DETECTED NOT DETECTED   Candida parapsilosis NOT DETECTED NOT DETECTED   Candida tropicalis NOT DETECTED NOT DETECTED    Name of physician (or Provider) Contacted: Dr. Alfredia Ferguson aware, Dr. Tommy Medal consulting per auto-consult for Staph aureus bacteremia  Changes to prescribed antibiotics required: None - continue Vancomycin   Norva Riffle 06/12/2017  11:33 AM

## 2017-06-12 NOTE — Evaluation (Signed)
Physical Therapy Evaluation Patient Details Name: [name] MRN: [id number] DOB: 07/12/1919 Today's Date: 06/12/2017   History of Present Illness  83 y.o. female with history of HTN, CAD, ASD repaired, permanent AF s/p AVN ablation (x2) w/PPM, on chronic coumadin, h/o diastolic chf, h/o venous insufficiency , chronic leg wound presented to the ED with confusion, weakness.  Dx includes UTI, acute hypoxic respiratory failure, AKI, FTT., acute on chronic diastolic heart failure    Clinical Impression  Pt admitted with above diagnosis. Pt currently with functional limitations due to the deficits listed below (see PT Problem List). Pt is modA for bed mobility and stand pivot transfer to chair. Pt will benefit from skilled PT to increase their independence and safety with mobility to allow discharge to the venue listed below.       Follow Up Recommendations Home health PT;Supervision/Assistance - 24 hour    Equipment Recommendations  None recommended by PT       Precautions / Restrictions Precautions Precautions: Fall Restrictions Weight Bearing Restrictions: No      Mobility  Bed Mobility Overal bed mobility: Needs Assistance Bed Mobility: Supine to Sit     Supine to sit: Mod assist     General bed mobility comments: modA for trunk to upright and pad scoot of hips to EOB, pt able to walk he legs to EoB  Transfers Overall transfer level: Needs assistance Equipment used: 1 person hand held assist Transfers: Stand Pivot Transfers   Stand pivot transfers: Mod assist       General transfer comment: modA for power up and pivoting to sit in chair , vc for hand placement and movement of LE       Balance Overall balance assessment: Needs assistance Sitting-balance support: Bilateral upper extremity supported;Feet unsupported Sitting balance-Leahy Scale: Poor Sitting balance - Comments: 1X LoB requiring minA to come to steady   Standing balance support: Bilateral upper  extremity supported Standing balance-Leahy Scale: Zero                               Pertinent Vitals/Pain Pain Assessment: 0-10 Pain Score: 2  Pain Location: buttocks Pain Descriptors / Indicators: Aching;Constant Pain Intervention(s): Limited activity within patient's tolerance;Monitored during session    Home Living Family/patient expects to be discharged to:: Private residence Living Arrangements: Children Available Help at Discharge: Family;Available 24 hours/day Type of Home: House Home Access: Stairs to enter Entrance Stairs-Rails: None Entrance Stairs-Number of Steps: 1 Home Layout: One level Home Equipment: Walker - 4 wheels;Tub bench      Prior Function Level of Independence: Independent with assistive device(s)         Comments: uses rollator for limited community ambulation, able to dress herself but daughter assists with iADLs        Extremity/Trunk Assessment   Upper Extremity Assessment Upper Extremity Assessment: Generalized weakness;RUE deficits/detail;LUE deficits/detail RUE Deficits / Details: decreased ROM and strength due to torn rotator cuff LUE Deficits / Details: decreased ROM and strength due to torn rotator cuff    Lower Extremity Assessment Lower Extremity Assessment: RLE deficits/detail;LLE deficits/detail RLE Deficits / Details: lower leg wound, stregth grossly 3+/5 in hips, knees and ankles LLE Deficits / Details: lower leg wound, stregth grossly 3+/5 in hips, knees and ankles    Cervical / Trunk Assessment Cervical / Trunk Assessment: Kyphotic  Communication   Communication: HOH  Cognition Arousal/Alertness: Awake/alert Behavior During Therapy: WFL for tasks assessed/performed Overall  Cognitive Status: History of cognitive impairments - at baseline                                 General Comments: pt's daughter aswered most questions about home living and independence, oriented to self and place       General Comments General comments (skin integrity, edema, etc.): SaO2 on 5L/min O2 via nasal cannula, at rest 96%O2, with movement dropped to 88%O2 with vc for pursed lipped breathing rebounded to 92%O2        Assessment/Plan    PT Assessment Patient needs continued PT services  PT Problem List Decreased strength;Decreased activity tolerance;Decreased balance;Decreased mobility;Decreased safety awareness       PT Treatment Interventions DME instruction;Gait training;Functional mobility training;Therapeutic activities;Therapeutic exercise;Balance training;Patient/family education    PT Goals (Current goals can be found in the Care Plan section)  Acute Rehab PT Goals Patient Stated Goal: go home PT Goal Formulation: With patient Time For Goal Achievement: 06/12/17 Potential to Achieve Goals: Fair    Frequency Min 3X/week    AM-PAC PT "6 Clicks" Daily Activity  Outcome Measure Difficulty turning over in bed (including adjusting bedclothes, sheets and blankets)?: Total Difficulty moving from lying on back to sitting on the side of the bed? : Total Difficulty sitting down on and standing up from a chair with arms (e.g., wheelchair, bedside commode, etc,.)?: Total Help needed moving to and from a bed to chair (including a wheelchair)?: A Lot Help needed walking in hospital room?: A Lot Help needed climbing 3-5 steps with a railing? : Total 6 Click Score: 8    End of Session Equipment Utilized During Treatment: Gait belt;Oxygen Activity Tolerance: Patient tolerated treatment well Patient left: in chair;with call bell/phone within reach;with nursing/sitter in room;with family/visitor present Nurse Communication: Mobility status PT Visit Diagnosis: Unsteadiness on feet (R26.81);Other abnormalities of gait and mobility (R26.89);History of falling (Z91.81);Muscle weakness (generalized) (M62.81);Difficulty in walking, not elsewhere classified (R26.2)    Time: 2458-0998 PT Time  Calculation (min) (ACUTE ONLY): 33 min   Charges:   PT Evaluation $PT Eval Moderate Complexity: 1 Mod PT Treatments $Therapeutic Activity: 8-22 mins   PT G Codes:        Tattianna Schnarr B. Migdalia Dk PT, DPT Acute Rehabilitation  [phone] Pager [phone]    [place] 06/12/2017, 4:33 PM

## 2017-06-12 NOTE — Consult Note (Signed)
Cardiology Consultation:   Patient ID: [name]; [id number]; [birth year]   Admit date: 06/01/2017 Date of Consult: 06/12/2017  Primary Care Provider: Golden Circle, FNP Primary Cardiologist/Primary Electrophysiologist:  Dr. [name]   Patient Profile:   [name] is a 83 y.o. female with a hx of permanent AF s/p AVN ablation (x2) w/PPM, CAD, CBA, ostium secundum ASD repaired, HLD, venous insufficiency, HFpEF, HTN. who is being seen today for the evaluation of MRSA bacteremia with PPM at the request of Dr. Alfredia Ferguson.  History of Present Illness:   [name] was admitted to [facility] yesterday with acute on set of shaking chills and profound weakness, she has been found to have MRSA bacteremia complicated by UTI, in the ER she initially was hypoxic requiring lasix and NRB that improved her SOB.  She is OOB at this time in her bedside chair, appears in NAD, she recognizes me and is able to tell me where we know each other from, is very hard of hearing at baseline.  She denies any CP or SOB at this time.   Device information: SJM dual chamber PPM programmed VVIR, implanted 1996, gen change 11/11/08, Dr. Roselie Skinner, s/p AVNode ablation  Past Medical History:  Diagnosis Date  . Anemia   . AV block    s/p PPM  . CAD (coronary artery disease)   . Chronic pain syndrome   . Diverticulosis of colon (without mention of hemorrhage)   . DJD (degenerative joint disease)   . LBP (low back pain)   . Osteoporosis    pelvic fx 10/2013 s/p fall  . Ostium secundum type atrial septal defect   . Overactive bladder   . Pure hypercholesterolemia   . Venous insufficiency     Past Surgical History:  Procedure Laterality Date  . ASD REPAIR    . CATARACT EXTRACTION    . GANGLION CYST EXCISION    . INGUINAL HERNIA REPAIR  2011   bilaterally  . PACEMAKER PLACEMENT  1996   [employer]  . PARTIAL HYSTERECTOMY    . SHOULDER SURGERY     x 2  . VESICOVAGINAL FISTULA CLOSURE W/ TAH  1973      Inpatient Medications: Scheduled Meds: . calcium-vitamin D  2 tablet Oral Daily  . cholecalciferol  1,000 Units Oral Daily  . ferrous sulfate  325 mg Oral Q breakfast  . hydrocortisone   Rectal BID  . lidocaine  1 patch Transdermal Q24H  . mouth rinse  15 mL Mouth Rinse BID  . omega-3 acid ethyl esters  1 g Oral QPM  . vitamin C  1,000 mg Oral Daily  . vitamin E  400 Units Oral Daily  . Warfarin - Pharmacist Dosing Inpatient   Does not apply q1800   Continuous Infusions: . sodium chloride    . sodium chloride    . [START ON 06/14/2017] vancomycin     PRN Meds: acetaminophen, acetaminophen, traMADol  Allergies:    Allergies  Allergen Reactions  . Amiodarone Hcl     REACTION: INTOL to Amiodarone in past  . Codeine Nausea And Vomiting  . Fentanyl     REACTION: causes nausea--dizziness  . Lamisil [Terbinafine Hcl]     Causes nausea  . Other     Social History:   Social History   Social History  . Marital status: Widowed    Spouse name: N/A  . Number of children: 2  . Years of education: N/A   Occupational History  .  Retired   .  Retired   Social History Main Topics  . Smoking status: Never Smoker  . Smokeless tobacco: Never Used  . Alcohol use No     Comment: social use  . Drug use: No  . Sexual activity: No   Other Topics Concern  . Not on file   Social History Narrative   Lives with daughter    Daughter works in day     Family History:   The patient's family history includes Breast cancer in her sister; Emphysema in her father; Heart disease in her mother; Heart failure in her mother. There is no history of Heart attack or Stroke.  ROS:  Please see the history of present illness.  ROS  All other ROS reviewed and negative.     Physical Exam/Data:   Vitals:   06/12/17 0800 06/12/17 0816 06/12/17 1200 06/12/17 1358  BP:  (!) 106/50 (!) 95/44   Pulse: 74  75 74  Resp: _0 Temp:  99 F (37.2 C) 98.4 F (36.9 C)   TempSrc:  Oral Oral    SpO2: 100%  96% 99%  Weight:        Intake/Output Summary (Last 24 hours) at 06/12/17 1606 Last data filed at 06/12/17 1500  Gross per 24 hour  Intake              110 ml  Output                0 ml  Net              110 ml   Filed Weights   05/20/2017 1331  Weight: 90 lb (40.8 kg)   Body mass index is 19.48 kg/m.  General:  Very petite, frail, nearly cachetic in appearance, though in no acute distress HEENT: normal Lymph: no adenopathy Neck: no JVD Endocrine:  No thryomegaly Vascular: No carotid bruits; hands/fingers are warm, bluish discoloration nail beds (daughter  remarks this happens on/off for years) Cardiac:  normal S1, S2; RRR; 2/6 SM, no gallops or rubs Lungs:  CTA b/l, no wheezing, rhonchi or rales  Abd: soft, nontender, no hepatomegaly  Ext: b/l LE wrapped, edema from last week is all but resolved as far as I can tell Musculoskeletal:  No deformities, BUE and BLE strength normal and equal Skin: warm and dry  Neuro:  CNs 2-12 intact, no focal abnormalities noted Psych:  Normal affect   EKG:  The EKG was personally reviewed and demonstrates:  V paced rhythm Telemetry:  Telemetry was personally reviewed and demonstrates:  Vv paced rhythm  Relevant CV Studies:  Echocardiogram 02/13/17: Study Conclusions - Left ventricle: The cavity size was normal. Wall thickness was normal. Systolic function was normal. The estimated ejection fraction was in the range of 60% to 65%. Wall motion was normal; there were no regional wall motion abnormalities. The study is not technically sufficient to allow evaluation of LV diastolic function. - Aortic valve: Mildly calcified leaflets. Mild stenosis. There was mild regurgitation. Mean gradient (S): 9 mm Hg. Peak gradient (S): 20 mm Hg. - Mitral valve: Calcified annulus. Mildly thickened leaflets . There was moderate regurgitation. - Left atrium: Severely dilated. - Right ventricle: The cavity size was mildly  dilated. Systolic function is mildly reduced. - Right atrium: Severely dilated. The atrium was normal in size. Pacer wire or catheter noted in right atrium. - Atrial septum: A septal defect cannot be excluded. - Tricuspid valve: There was moderate  regurgitation. - Pulmonic valve: There was mild regurgitation. - Pulmonary arteries: PA peak pressure: 72 mm Hg (S). - Inferior vena cava: The vessel was dilated. The respirophasic diameter changes were blunted (<50%), consistent with elevated central venous pressure. Impressions: - LVEF 60-65%, mild AS, mild AI, severe biatrial enlargment, MAC with moderate MR, moderate TR, pacer leads noted, mild PI, RVSP 72 mmHg, dilated IVC.  Laboratory Data:  Chemistry Recent Labs Lab 05/28/2017 1612 06/12/17 0314  NA 139 137  K 5.2* 5.0  CL 104 105  CO2 23 21*  GLUCOSE 89 68  BUN 24* 28*  CREATININE 1.32* 1.36*  CALCIUM 9.1 8.4*  GFRNONAA 33* 32*  GFRAA 38* 37*  ANIONGAP 12 11     Recent Labs Lab 05/14/2017 1612 06/12/17 0314  PROT 6.2* 5.9*  ALBUMIN 3.3* 2.9*  AST 82* 83*  ALT 17 19  ALKPHOS 75 66  BILITOT 1.6* 1.5*   Hematology Recent Labs Lab 06/03/2017 1612 06/12/17 0314  WBC 10.1 11.7*  RBC 3.67* 3.71*  HGB 13.2 13.4  HCT 40.2 40.4  MCV 109.5* 108.9*  MCH 36.0* 36.1*  MCHC 32.8 33.2  RDW 18.1* 18.3*  PLT 59* 50*   Cardiac EnzymesNo results for input(s): TROPONINI in the last 168 hours.  Recent Labs Lab 06/05/2017 1639  TROPIPOC 0.21*    BNPNo results for input(s): BNP, PROBNP in the last 168 hours.  DDimer No results for input(s): DDIMER in the last 168 hours.  Radiology/Studies:  Dg Chest 2 View Result Date: 05/13/2017 CLINICAL DATA:  Shortness of breath . EXAM: CHEST  2 VIEW COMPARISON:  04/18/2017. FINDINGS: Cardiac pacer with lead tips in right atrium right ventricle. Stable cardiomegaly. Interval clearing of right base infiltrate. Persistent low lung volumes and bilateral pleural effusions. No  pneumothorax . IMPRESSION: 1. Interim clearing of right base infiltrate. 2. Persistent bibasilar atelectasis and small pleural effusions. 3. Cardiac pacer stable position.  Stable cardiomegaly. Electronically Signed   By: Marcello Moores  Register   On: 05/17/2017 14:58    Assessment and Plan:   1. MRSA bacteremia       Gram + Cocci (x2 in one day), ID panel reports MRSA         PPM originally was implanted leads are from 1996, last generator 2009. TTE echo is completed, pending, (the patient's daughter tells me ID reported the infection was involving her pacemaker), though I have not seen a result.  Dr. Lovena Le has seen the patient, extraction on such a frail patient with age of 79 would not be recommended preferring palliative antibiotic strategy, I have discussed this with the patient and her daughter/son at bedside, they state understanding.  BP has been OK, sats 98-99 on n/c O2   2. HFpEF, chronic     Initially in ER was acutely hopoxic and treated with O2, lasix     Her edema is markedly improved from last month     General cardiology is on board, she has received some lasix here  3. Permanent AF     Agree at this point, with platelets down to 68's a/c for this frail patient may be more risk then benefit  4. Chronic LE wounds     Likely source of bacteremia  5. UTI  6. CRI    Signed, Baldwin Jamaica, PA-C  06/12/2017 4:06 PM   EP Attending  Patient seen and examined. Agree with above. The patient is at the end of her life and has MRSA bacteremia and probable involvement  of her PM system. She is not a candidate for lead extraction. Too many comorbidities. She has declined markedly and also has thrombocytopenia. She has CHB and is PM dependent. She has no reserve. I would suggest comfort care and suppressive anti-biotics. Consider hospice.  Mikle Bosworth.D.

## 2017-06-12 NOTE — ED Notes (Signed)
Attempted report x 1; name and call back number provided 

## 2017-06-12 NOTE — Progress Notes (Signed)
PROGRESS NOTE    [name]  DXA:[id number] DOB: Sep 28, 1919 DOA: 06/07/2017 PCP: Golden Circle, FNP  Brief Narrative:  [name] is a 83 y.o. female with history of HTN, CAD,  h/o ASD repair, permanent AF s/p AVN ablation (x2) w/PPM, on chronic coumadin, h/o Diastolic CHF, h/o venous insufficiency, chronic leg wound presented to the ED due to above complaints. Daughter reports patient was recently treated for community acquired pneumonia, then got fluids overload. Her cardiologist recently increased her lasix to 80mg  daily a month ago. Per chart review, she was just seen by cardiology on 7/24 with "OK"volume status and renal function.Daughter report patient weight still up about 1.5lbs (84.5lbs) from target weight (83lbs) She is brought to the ED due to sob, weakness and confusion. She was found to have a MRSA bacteremia and ID, Cardiology, Electrophysiology, and Palliative Care Medicine were consulted for further evaluation and recommendations.   Assessment & Plan:   Principal Problem:   Pacemaker infection ([place]) Active Problems:   HYPERCHOLESTEROLEMIA   Chronic pain syndrome   Coronary atherosclerosis   BACK PAIN, LUMBAR   Atrial fibrillation (HCC)   Permanent atrial fibrillation (HCC)   Bacteriuria with pyuria   Hypoxia   Acute hypoxemic respiratory failure (HCC)   Acute diastolic CHF (congestive heart failure) (HCC)   MRSA bacteremia   Pyuria   AKI (acute kidney injury) ([place])   Thrombocytopenia (HCC)  Confusion, likely Metabolic encephalopathy from Infectious Etiology -Family report patient is more confused than her baseline, hard to get any history from patient -Continue to Monitor -SLP ordered and recommending resuming a regular consistency diet and thin liquids and continuing cruhsing meds with puree -If Encephalopathy is not improving after infection is treated will pursue MRI of brain   MRSA Bacteremia -Patient has 2/2 Blood Cx positive for MRSA  -Pacemaker  is likely also infected. Electrophysiologist was consulted and they recommend against extraction and recommending palliative Abx Strategy -Transthoracic ECHO is ordered and Pending -Repeat Blood Cx in AM -IV Vancomycin started this AM and per ID would treat with Aggressive IV Abx for 6 weeks followed by lifelong Abx -Per ID do not place PICC until Blood Cx are NGTD at 4-5 days -WBC went from 10.1 -> 11.7 -Lactate went from 4.02 -> 1.8 -Palliative Care Consulted for further Goals of Care as   Acute hypoxic respiratory failure Suspect from acute on chronic diastolic chf exacerbation,  -She received iv lasix 80mg  in the ED x1,  -Admitted to [facility] -Cardiology consulted and recommending holding diuretics and obtaining a Transthoracic ECHO  Pyuria with Bacteruria -U/A with many bacteria, wbc TNTC, RBC TNTC, denies back pain, denies dysuria ( per daughter) -Urine culture pending -Rocephin Discontinued by Infectious Diseases as they do not believe she has a UTI  AKI:  -Likley from Heart Failure -BUN/Cr went from 24/1.32 -> 24/1.32 -Continue to Renally dose medications -Repeat CMP in AM  Thrombocytopenia:  -Acute on chronic, noted platelet started to trend down since 03/2017 -From liver congestion?  From acute infection? Less likely MAHA, no mention of schistocytes, no recent heparin exposure ( has been on coumadin),  -will check fractionated bili -Monitor, consider stop coumadin if plt less than 50. Platelet Count was 50  -No acute bleeding  Mild elevation of lft, possibly from liver congestion Repeat in am  Mild hyperkalemia: k 5.2, she receive albuterol in the ED, given lasix, repeat in am  Chronic Afib, s/p pacemaker,  -Purrently paced rhythm -not on rate control agent  at home but on Anticoagulation with Coumadin  -Pharmacy to dose coumadin -PT-INR was 34.5-3.32 -Repeat PT INR in AM  Chronic leg wounds -Suspect the cause of the bacteremia -Wound care  consulted  FTT -has 24/7 care at home, likely will need home health  Mildly Elevated Troponin -Likely demand ischemia in the setting of Atrial Fibrillation and Infection -Cardiology Consulted for further evaluation and Recommendations  DVT prophylaxis: Anticoagulated with Coumadin Code Status: FULL CODE Family Communication: Discussed with Daughter at bedside Disposition Plan: Remain Inpatient and [place] [facility] when medically cleared  Consultants:   Infectious Diseases  Cardiology  Electrophysiology  Palliative Care Medicine   Procedures:    Antimicrobials: Anti-infectives    Start     Dose/Rate Route Frequency Ordered Stop   06/14/17 1000  vancomycin (VANCOCIN) 500 mg in sodium chloride 0.9 % 100 mL IVPB     500 mg 100 mL/hr over 60 Minutes Intravenous Every 48 hours 06/12/17 0940     06/12/17 1000  vancomycin (VANCOCIN) IVPB 750 mg/150 ml premix     750 mg 150 mL/hr over 60 Minutes Intravenous  Once 06/12/17 0940 06/12/17 1100   05/28/2017 2145  cefTRIAXone (ROCEPHIN) 1 g in dextrose 5 % 50 mL IVPB  Status:  Discontinued     1 g 100 mL/hr over 30 Minutes Intravenous Every 24 hours 05/22/2017 2136 06/12/17 1039   06/10/2017 1545  cefTRIAXone (ROCEPHIN) 1 g in dextrose 5 % 50 mL IVPB     1 g 100 mL/hr over 30 Minutes Intravenous  Once 05/30/2017 1543 05/24/2017 1718     Subjective: Thin Cachectic slightly confused female seen and examined at beside. She was complaining of lower extremity pain. No nausea or vomiting. Still slightly SOB.   Objective: Vitals:   06/12/17 0816 06/12/17 1200 06/12/17 1358 06/12/17 1637  BP: (!) 106/50 (!) 95/44  (!) 96/45  Pulse:  75 74   Resp:  19 19   Temp: 99 F (37.2 C) 98.4 F (36.9 C)  98.5 F (36.9 C)  TempSrc: Oral Oral  Oral  SpO2:  96% 99% 97%  Weight:        Intake/Output Summary (Last 24 hours) at 06/12/17 1955 Last data filed at 06/12/17 1600  Gross per 24 hour  Intake              180 ml  Output                1 ml   Net              179 ml   Filed Weights   06/08/2017 1331  Weight: 40.8 kg (90 lb)   Examination: Physical Exam:  Constitutional: Thin cachetic elderly female in NAD and appears calm  Eyes: Lids and conjunctivae normal, sclerae anicteric  ENMT: External Ears, Nose appear normal. Grossly normal hearing. Mucous membranes are slightly dry.  Neck: Appears normal, supple, no cervical masses, normal ROM, no appreciable thyromegaly, no JVD Respiratory: Diminished to auscultation bilaterally, no wheezing, rales, rhonchi or crackles. Normal respiratory effort and patient is not tachypenic. No accessory muscle use.  Cardiovascular: Irregularly Irregular, no murmurs / rubs / gallops. S1 and S2 auscultated. Bilateral Lower extremities are wrapped. Abdomen: Soft, non-tender, non-distended. No masses palpated. No appreciable hepatosplenomegaly. Bowel sounds positive.  GU: Deferred. Musculoskeletal: No contractures. No cyanosis Skin: Bilateral lower extremities are wrapped. No induration; Warm and dry.  Neurologic: CN 2-12 grossly intact with no focal deficits.  Romberg sign cerebellar reflexes  not assessed.  Psychiatric: Impaired judgment and insight. Alert but not oriented x3. Normal mood and appropriate affect.   Data Reviewed: I have personally reviewed following labs and imaging studies  CBC:  Recent Labs Lab 05/22/2017 1612 06/12/17 0314  WBC 10.1 11.7*  NEUTROABS 9.7*  --   HGB 13.2 13.4  HCT 40.2 40.4  MCV 109.5* 108.9*  PLT 59* 50*   Basic Metabolic Panel:  Recent Labs Lab 05/30/2017 1612 06/12/17 0314  NA 139 137  K 5.2* 5.0  CL 104 105  CO2 23 21*  GLUCOSE 89 68  BUN 24* 28*  CREATININE 1.32* 1.36*  CALCIUM 9.1 8.4*   GFR: Estimated Creatinine Clearance: 14.7 mL/min (A) (by C-G formula based on SCr of 1.36 mg/dL (H)). Liver Function Tests:  Recent Labs Lab 06/02/2017 1612 06/12/17 0314  AST 82* 83*  ALT 17 19  ALKPHOS 75 66  BILITOT 1.6* 1.5*  PROT 6.2* 5.9*   ALBUMIN 3.3* 2.9*   No results for input(s): LIPASE, AMYLASE in the last 168 hours. No results for input(s): AMMONIA in the last 168 hours. Coagulation Profile:  Recent Labs Lab 06/12/17 0125 06/12/17 0314  INR 3.44 3.32   Cardiac Enzymes: No results for input(s): CKTOTAL, CKMB, CKMBINDEX, TROPONINI in the last 168 hours. BNP (last 3 results) No results for input(s): PROBNP in the last 8760 hours. HbA1C: No results for input(s): HGBA1C in the last 72 hours. CBG: No results for input(s): GLUCAP in the last 168 hours. Lipid Profile: No results for input(s): CHOL, HDL, LDLCALC, TRIG, CHOLHDL, LDLDIRECT in the last 72 hours. Thyroid Function Tests: No results for input(s): TSH, T4TOTAL, FREET4, T3FREE, THYROIDAB in the last 72 hours. Anemia Panel: No results for input(s): VITAMINB12, FOLATE, FERRITIN, TIBC, IRON, RETICCTPCT in the last 72 hours. Sepsis Labs:  Recent Labs Lab 05/24/2017 1641 06/12/17 0931 06/12/17 1058  LATICACIDVEN 4.02* 1.8 1.8    Recent Results (from the past 240 hour(s))  Culture, blood (routine x 2)     Status: None (Preliminary result)   Collection Time: 05/20/2017  4:01 PM  Result Value Ref Range Status   Specimen Description BLOOD RIGHT WRIST  Final   Special Requests IN PEDIATRIC BOTTLE Blood Culture adequate volume  Final   Culture  Setup Time   Final    GRAM POSITIVE COCCI IN CLUSTERS AEROBIC BOTTLE ONLY CRITICAL VALUE NOTED.  VALUE IS CONSISTENT WITH PREVIOUSLY REPORTED AND CALLED VALUE.    Culture GRAM POSITIVE COCCI  Final   Report Status PENDING  Incomplete  Culture, blood (routine x 2)     Status: None (Preliminary result)   Collection Time: 05/18/2017  4:12 PM  Result Value Ref Range Status   Specimen Description BLOOD RIGHT ANTECUBITAL  Final   Special Requests   Final    BOTTLES DRAWN AEROBIC AND ANAEROBIC Blood Culture adequate volume   Culture  Setup Time   Final    GRAM POSITIVE COCCI IN CLUSTERS IN BOTH AEROBIC AND ANAEROBIC  BOTTLES Organism ID to follow CRITICAL RESULT CALLED TO, READ BACK BY AND VERIFIED WITH: MVonita Moss.D. 10:15 06/12/17 ([email or web address])    Culture GRAM POSITIVE COCCI  Final   Report Status PENDING  Incomplete  Blood Culture ID Panel (Reflexed)     Status: Abnormal   Collection Time: 05/17/2017  4:12 PM  Result Value Ref Range Status   Enterococcus species NOT DETECTED NOT DETECTED Final   Listeria monocytogenes NOT DETECTED NOT DETECTED Final   Staphylococcus species DETECTED (A)  NOT DETECTED Final    Comment: CRITICAL RESULT CALLED TO, READ BACK BY AND VERIFIED WITH: M. Radford Pax Pharm.D. 10:15 06/12/17 ([email or web address])    Staphylococcus aureus DETECTED (A) NOT DETECTED Final    Comment: Methicillin (oxacillin)-resistant Staphylococcus aureus (MRSA). MRSA is predictably resistant to beta-lactam antibiotics (except ceftaroline). Preferred therapy is vancomycin unless clinically contraindicated. Patient requires contact precautions if  hospitalized. CRITICAL RESULT CALLED TO, READ BACK BY AND VERIFIED WITH: M. Radford Pax Pharm.D. 10:15 06/12/17 ([email or web address])    Methicillin resistance DETECTED (A) NOT DETECTED Final    Comment: CRITICAL RESULT CALLED TO, READ BACK BY AND VERIFIED WITH: M. Radford Pax Pharm.D. 10:15 06/12/17 ([email or web address])    Streptococcus species NOT DETECTED NOT DETECTED Final   Streptococcus agalactiae NOT DETECTED NOT DETECTED Final   Streptococcus pneumoniae NOT DETECTED NOT DETECTED Final   Streptococcus pyogenes NOT DETECTED NOT DETECTED Final   Acinetobacter baumannii NOT DETECTED NOT DETECTED Final   Enterobacteriaceae species NOT DETECTED NOT DETECTED Final   Enterobacter cloacae complex NOT DETECTED NOT DETECTED Final   Escherichia coli NOT DETECTED NOT DETECTED Final   Klebsiella oxytoca NOT DETECTED NOT DETECTED Final   Klebsiella pneumoniae NOT DETECTED NOT DETECTED Final   Proteus species NOT DETECTED NOT DETECTED Final   Serratia marcescens NOT DETECTED NOT DETECTED Final   Haemophilus  influenzae NOT DETECTED NOT DETECTED Final   Neisseria meningitidis NOT DETECTED NOT DETECTED Final   Pseudomonas aeruginosa NOT DETECTED NOT DETECTED Final   Candida albicans NOT DETECTED NOT DETECTED Final   Candida glabrata NOT DETECTED NOT DETECTED Final   Candida krusei NOT DETECTED NOT DETECTED Final   Candida parapsilosis NOT DETECTED NOT DETECTED Final   Candida tropicalis NOT DETECTED NOT DETECTED Final  MRSA PCR Screening     Status: None   Collection Time: 06/12/17  4:36 AM  Result Value Ref Range Status   MRSA by PCR NEGATIVE NEGATIVE Final    Comment:        The GeneXpert MRSA Assay (FDA approved for NASAL specimens only), is one component of a comprehensive MRSA colonization surveillance program. It is not intended to diagnose MRSA infection nor to guide or monitor treatment for MRSA infections.      Radiology Studies: Dg Chest 2 View  Result Date: 06/03/2017 CLINICAL DATA:  Shortness of breath . EXAM: CHEST  2 VIEW COMPARISON:  04/18/2017. FINDINGS: Cardiac pacer with lead tips in right atrium right ventricle. Stable cardiomegaly. Interval clearing of right base infiltrate. Persistent low lung volumes and bilateral pleural effusions. No pneumothorax . IMPRESSION: 1. Interim clearing of right base infiltrate. 2. Persistent bibasilar atelectasis and small pleural effusions. 3. Cardiac pacer stable position.  Stable cardiomegaly. Electronically Signed   By: Marcello Moores  Register   On: 06/05/2017 14:58   Scheduled Meds: . calcium-vitamin D  2 tablet Oral Daily  . cholecalciferol  1,000 Units Oral Daily  . ferrous sulfate  325 mg Oral Q breakfast  . hydrocortisone   Rectal BID  . lidocaine  1 patch Transdermal Q24H  . mouth rinse  15 mL Mouth Rinse BID  . omega-3 acid ethyl esters  1 g Oral QPM  . vitamin C  1,000 mg Oral Daily  . vitamin E  400 Units Oral Daily  . Warfarin - Pharmacist Dosing Inpatient   Does not apply q1800   Continuous Infusions: . sodium chloride     . [START ON 06/14/2017] vancomycin       LOS: 1 day   Kerney Elbe, DO [facility] Pager [phone]  If 7PM-7AM, please contact night-coverage [email or web address] Password [email or web address] 06/12/2017, 7:55 PM

## 2017-06-12 NOTE — Progress Notes (Signed)
  Echocardiogram 2D Echocardiogram has been performed.  [name] 06/12/2017, 2:25 PM

## 2017-06-12 NOTE — Consult Note (Addendum)
INFECTIOUS DISEASE ATTENDING ADDENDUM:   Date: 06/12/2017  Patient name: [name]  Medical record number: [id number]  Date of birth: 04-29-1919    This patient has been seen and discussed with the house staff.  I have reviewed the history and physical review of systems past medical history surgical history family history review of systems and pertinent laboratory findings.  Please see the resident's note for complete details. I concur with their findings with the following additions/corrections:    This is a 83 year old lady with cachexia, CAD, AF sp ablation with permanent Pacemaker, with chronic skin wounds including one on her leg is been present for 3 months. She's been admitted with sepsis and found to have MRSA bacteremia. Given that she has an indwelling pacemaker it is by definition infected with MRSA.  On exam she is hard of hearing and underweight pacemaker is clearly visible under the skin on the right chest her lungs are relatively clear to auscultation bilaterally abdomen benign her lower extremities are wrapped where she has her skin breakdown. She does not have murmur on exam. She has mx echymoses   #1 MRSA bacteremia with Pacemaker infection (by definition). TTE may give [place] more information but again MRSA bacteremia + PM = PM infection  The only way to truly eradicate this infection is for her pacemaker to be removed. She is pacer dependent and this would require insertion of a temporary pacemaker. She would then need to be treated for 6 weeks with IV antibiotics prior to reimplantation of the terminal pacemaker. Given her age and overall health I doubt there is seriously that electrophysiology would pursue pacemaker explantation.   I explained this to the family including her son and daughter and the patient.  The second option is to try to treat her with aggressive IV antibiotics for 6 weeks followed by lifelong oral antibiotics in an attempt to suppress the  pacemaker infection. This is also not a trivial endeavor especially given again her age and comorbidities.   For now we will continue with vancomycin  We will repeat blood cultures to ensure clearance  Please DO NOT PLACE PICC until Blood culturs NGTD at 4-5 days  Image leg where she has the long standing ulcer  Wound care  #2 Pyuria: Pyuria  DOES NOT equal UTI. She does NOT have UTI. She could have seeded urine from blood  But she does not have UTI  #3 Goals of care: recommend palliative care consult. Morality for SAB is already 25% + for all comers. Her risk of recurrence and sepsis from this is VERY HIGH  I spent greater than 80  minutes with the patient including greater than 50% of time in face to face counsel of the patient and family re her SAB PM infection, leg wounds, overall prognosis and in coordination of her care.             [place] [facility]    Date of Admission:  05/13/2017   Total days of antibiotics 1        Day 1 of Vancomycin        Day 1 of Ceftriaxone-stopped 8/1             Reason for Consult: Automatic consult for MRSA bacteremia    Referring Provider: Dr. Florencia Reasons Primary Care Provider: Golden Circle, FNP  Assessment:  1) MRSA Bacteremia: The patient has MRSA bacteremiaThe source of the infection is likely  to be from a skin infection that grew from her chronic right leg wound. There is high likelihood of the infection infiltrating the pacemaker, but will likely not intervene at this time. Will get TTE to assess for any possible vegetations. Will treat with IV Vancomycin and get daily blood cultures.    Plan: 1. Started IV Vancomycin 06/12/17 2. Repeat daily blood cultures in the am 3. Pending TTE 4. Pending urine culture   . calcium-vitamin D  2 tablet Oral Daily  . cholecalciferol  1,000 Units Oral Daily  . ferrous sulfate  325 mg Oral Q breakfast  . furosemide  80 mg Intravenous Daily  . hydrocortisone   Rectal BID   . lidocaine  1 patch Transdermal Q24H  . mouth rinse  15 mL Mouth Rinse BID  . omega-3 acid ethyl esters  1 g Oral QPM  . vitamin C  1,000 mg Oral Daily  . vitamin E  400 Units Oral Daily  . Warfarin - Pharmacist Dosing Inpatient   Does not apply q1800    HPI: [name] is a 83 y.o. female with pmh of htn, cad, atrial fibrillation s/p avn ablation (x2) and ppm, diastolic chf, venous insufficiency, and chronic leg wound who presented for altered mental status. The patient was not able to recall/explain what brought her to the hospital and therefore most of the history was provided by her daughter who was at bedside. The patient was apparently feeling very weak, fatigued,confused, and has had some dizziness for the past few days. She has been having urgency and frequency during urination without any burning. She has also had accompanied abdominal pain intermittently with diarrhea. The patient has a chronic wound/abrasions in her right leg subsequent to a fall one month ago for which she is getting wound care.  Review of Systems: Review of Systems  Respiratory: Negative for shortness of breath.   Cardiovascular: Negative for chest pain and palpitations.  Gastrointestinal: Positive for abdominal pain and diarrhea. Negative for nausea and vomiting.  Genitourinary: Positive for frequency and urgency. Negative for dysuria and hematuria.  Neurological: Positive for dizziness.      Past Medical History:  Diagnosis Date  . Anemia   . AV block    s/p PPM  . CAD (coronary artery disease)   . Chronic pain syndrome   . Diverticulosis of colon (without mention of hemorrhage)   . DJD (degenerative joint disease)   . LBP (low back pain)   . Osteoporosis    pelvic fx 10/2013 s/p fall  . Ostium secundum type atrial septal defect   . Overactive bladder   . Pure hypercholesterolemia   . Venous insufficiency     Social History  Substance Use Topics  . Smoking status: Never Smoker  .  Smokeless tobacco: Never Used  . Alcohol use No     Comment: social use    Family History  Problem Relation Age of Onset  . Heart disease Mother   . Heart failure Mother   . Emphysema Father   . Breast cancer Sister   . Heart attack Neg Hx   . Stroke Neg Hx    Allergies  Allergen Reactions  . Amiodarone Hcl     REACTION: INTOL to Amiodarone in past  . Codeine Nausea And Vomiting  . Fentanyl     REACTION: causes nausea--dizziness  . Lamisil [Terbinafine Hcl]     Causes nausea  . Other     OBJECTIVE: Blood pressure (!) 106/50, pulse  74, temperature 99 F (37.2 C), temperature source Oral, resp. rate 16, weight 90 lb (40.8 kg), SpO2 100 %.  Physical Exam  Constitutional: She appears distressed.  HENT:  Head: Normocephalic and atraumatic.  Cardiovascular: Normal rate, regular rhythm, normal heart sounds and intact distal pulses.   No murmur heard. Pulmonary/Chest: Effort normal and breath sounds normal. No respiratory distress. She has no wheezes.  Abdominal: Soft. Bowel sounds are normal.  Musculoskeletal: She exhibits no edema.  Neurological:  Oriented to her name, year, season.   Skin: Bruising and ecchymosis noted. There is erythema.  Abrasion present close to site of iv. The patient's skin is very thin.    Physical Exam  Constitutional: She appears distressed.  HENT:  Head: Normocephalic and atraumatic.  Cardiovascular: Normal rate, regular rhythm, normal heart sounds and intact distal pulses.   No murmur heard. Pulmonary/Chest: Effort normal and breath sounds normal. No respiratory distress. She has no wheezes.  Abdominal: Soft. Bowel sounds are normal.  Musculoskeletal: She exhibits no edema.  Neurological:  Oriented to her name, year, season.   Skin: Bruising and ecchymosis noted. There is erythema.  Abrasion present close to site of iv. The patient's skin is very thin.    Lab Results Lab Results  Component Value Date   WBC 11.7 (H) 06/12/2017   HGB  13.4 06/12/2017   HCT 40.4 06/12/2017   MCV 108.9 (H) 06/12/2017   PLT 50 (L) 06/12/2017    Lab Results  Component Value Date   CREATININE 1.36 (H) 06/12/2017   BUN 28 (H) 06/12/2017   NA 137 06/12/2017   K 5.0 06/12/2017   CL 105 06/12/2017   CO2 21 (L) 06/12/2017    Lab Results  Component Value Date   ALT 19 06/12/2017   AST 83 (H) 06/12/2017   ALKPHOS 66 06/12/2017   BILITOT 1.5 (H) 06/12/2017     Microbiology: Recent Results (from the past 240 hour(s))  Culture, blood (routine x 2)     Status: None (Preliminary result)   Collection Time: 06/05/2017  4:01 PM  Result Value Ref Range Status   Specimen Description BLOOD RIGHT WRIST  Final   Special Requests IN PEDIATRIC BOTTLE Blood Culture adequate volume  Final   Culture  Setup Time   Final    GRAM POSITIVE COCCI IN CLUSTERS AEROBIC BOTTLE ONLY CRITICAL VALUE NOTED.  VALUE IS CONSISTENT WITH PREVIOUSLY REPORTED AND CALLED VALUE.    Culture GRAM POSITIVE COCCI  Final   Report Status PENDING  Incomplete  Culture, blood (routine x 2)     Status: None (Preliminary result)   Collection Time: 05/12/2017  4:12 PM  Result Value Ref Range Status   Specimen Description BLOOD RIGHT ANTECUBITAL  Final   Special Requests   Final    BOTTLES DRAWN AEROBIC AND ANAEROBIC Blood Culture adequate volume   Culture  Setup Time   Final    GRAM POSITIVE COCCI IN CLUSTERS ANAEROBIC BOTTLE ONLY Organism ID to follow CRITICAL RESULT CALLED TO, READ BACK BY AND VERIFIED WITH: MVonita Moss.D. 10:15 06/12/17 ([email or web address])    Culture GRAM POSITIVE COCCI  Final   Report Status PENDING  Incomplete  Blood Culture ID Panel (Reflexed)     Status: Abnormal   Collection Time: 05/17/2017  4:12 PM  Result Value Ref Range Status   Enterococcus species NOT DETECTED NOT DETECTED Final   Listeria monocytogenes NOT DETECTED NOT DETECTED Final   Staphylococcus species DETECTED (A) NOT DETECTED Final  Comment: CRITICAL RESULT CALLED TO, READ BACK BY AND VERIFIED  WITH: M. Radford Pax Pharm.D. 10:15 06/12/17 ([email or web address])    Staphylococcus aureus DETECTED (A) NOT DETECTED Final    Comment: Methicillin (oxacillin)-resistant Staphylococcus aureus (MRSA). MRSA is predictably resistant to beta-lactam antibiotics (except ceftaroline). Preferred therapy is vancomycin unless clinically contraindicated. Patient requires contact precautions if  hospitalized. CRITICAL RESULT CALLED TO, READ BACK BY AND VERIFIED WITH: M. Radford Pax Pharm.D. 10:15 06/12/17 ([email or web address])    Methicillin resistance DETECTED (A) NOT DETECTED Final    Comment: CRITICAL RESULT CALLED TO, READ BACK BY AND VERIFIED WITH: M. Radford Pax Pharm.D. 10:15 06/12/17 ([email or web address])    Streptococcus species NOT DETECTED NOT DETECTED Final   Streptococcus agalactiae NOT DETECTED NOT DETECTED Final   Streptococcus pneumoniae NOT DETECTED NOT DETECTED Final   Streptococcus pyogenes NOT DETECTED NOT DETECTED Final   Acinetobacter baumannii NOT DETECTED NOT DETECTED Final   Enterobacteriaceae species NOT DETECTED NOT DETECTED Final   Enterobacter cloacae complex NOT DETECTED NOT DETECTED Final   Escherichia coli NOT DETECTED NOT DETECTED Final   Klebsiella oxytoca NOT DETECTED NOT DETECTED Final   Klebsiella pneumoniae NOT DETECTED NOT DETECTED Final   Proteus species NOT DETECTED NOT DETECTED Final   Serratia marcescens NOT DETECTED NOT DETECTED Final   Haemophilus influenzae NOT DETECTED NOT DETECTED Final   Neisseria meningitidis NOT DETECTED NOT DETECTED Final   Pseudomonas aeruginosa NOT DETECTED NOT DETECTED Final   Candida albicans NOT DETECTED NOT DETECTED Final   Candida glabrata NOT DETECTED NOT DETECTED Final   Candida krusei NOT DETECTED NOT DETECTED Final   Candida parapsilosis NOT DETECTED NOT DETECTED Final   Candida tropicalis NOT DETECTED NOT DETECTED Final  MRSA PCR Screening     Status: None   Collection Time: 06/12/17  4:36 AM  Result Value Ref Range Status   MRSA by PCR NEGATIVE NEGATIVE Final     Comment:        The GeneXpert MRSA Assay (FDA approved for NASAL specimens only), is one component of a comprehensive MRSA colonization surveillance program. It is not intended to diagnose MRSA infection nor to guide or monitor treatment for MRSA infections.     Lars Mage, [facility] for [place] [phone] pager   [phone] cell 06/12/2017, 11:00 AM

## 2017-06-12 DEATH — deceased

## 2017-06-13 ENCOUNTER — Encounter (HOSPITAL_BASED_OUTPATIENT_CLINIC_OR_DEPARTMENT_OTHER): Payer: Medicare Other

## 2017-06-13 DIAGNOSIS — I481 Persistent atrial fibrillation: Secondary | ICD-10-CM

## 2017-06-13 DIAGNOSIS — T827XXA Infection and inflammatory reaction due to other cardiac and vascular devices, implants and grafts, initial encounter: Principal | ICD-10-CM

## 2017-06-13 DIAGNOSIS — T814XXD Infection following a procedure, subsequent encounter: Secondary | ICD-10-CM

## 2017-06-13 DIAGNOSIS — T827XXD Infection and inflammatory reaction due to other cardiac and vascular devices, implants and grafts, subsequent encounter: Secondary | ICD-10-CM

## 2017-06-13 DIAGNOSIS — I38 Endocarditis, valve unspecified: Secondary | ICD-10-CM

## 2017-06-13 DIAGNOSIS — R4182 Altered mental status, unspecified: Secondary | ICD-10-CM

## 2017-06-13 DIAGNOSIS — Z515 Encounter for palliative care: Secondary | ICD-10-CM

## 2017-06-13 DIAGNOSIS — R7881 Bacteremia: Secondary | ICD-10-CM

## 2017-06-13 DIAGNOSIS — Z66 Do not resuscitate: Secondary | ICD-10-CM

## 2017-06-13 DIAGNOSIS — IMO0002 Reserved for concepts with insufficient information to code with codable children: Secondary | ICD-10-CM

## 2017-06-13 LAB — COMPREHENSIVE METABOLIC PANEL
ALT: 18 U/L (ref 14–54)
AST: 76 U/L — ABNORMAL HIGH (ref 15–41)
Albumin: 2.6 g/dL — ABNORMAL LOW (ref 3.5–5.0)
Alkaline Phosphatase: 57 U/L (ref 38–126)
Anion gap: 8 (ref 5–15)
BUN: 47 mg/dL — ABNORMAL HIGH (ref 6–20)
CO2: 23 mmol/L (ref 22–32)
Calcium: 7.8 mg/dL — ABNORMAL LOW (ref 8.9–10.3)
Chloride: 106 mmol/L (ref 101–111)
Creatinine, Ser: 1.64 mg/dL — ABNORMAL HIGH (ref 0.44–1.00)
GFR calc Af Amer: 29 mL/min — ABNORMAL LOW (ref >60)
GFR calc non Af Amer: 25 mL/min — ABNORMAL LOW (ref >60)
Glucose, Bld: 86 mg/dL (ref 65–99)
Potassium: 4.2 mmol/L (ref 3.5–5.1)
Sodium: 137 mmol/L (ref 135–145)
Total Bilirubin: 1.6 mg/dL — ABNORMAL HIGH (ref 0.3–1.2)
Total Protein: 5.2 g/dL — ABNORMAL LOW (ref 6.5–8.1)

## 2017-06-13 LAB — CBC WITH DIFFERENTIAL/PLATELET
Basophils Absolute: 0 10*3/uL (ref 0.0–0.1)
Basophils Relative: 0 %
Eosinophils Absolute: 0.2 10*3/uL (ref 0.0–0.7)
Eosinophils Relative: 3 %
HCT: 36.8 % (ref 36.0–46.0)
Hemoglobin: 12.5 g/dL (ref 12.0–15.0)
Lymphocytes Relative: 2 %
Lymphs Abs: 0.2 10*3/uL — ABNORMAL LOW (ref 0.7–4.0)
MCH: 36 pg — ABNORMAL HIGH (ref 26.0–34.0)
MCHC: 34 g/dL (ref 30.0–36.0)
MCV: 106.1 fL — ABNORMAL HIGH (ref 78.0–100.0)
Monocytes Absolute: 0.4 10*3/uL (ref 0.1–1.0)
Monocytes Relative: 4 %
Neutro Abs: 8.7 10*3/uL — ABNORMAL HIGH (ref 1.7–7.7)
Neutrophils Relative %: 91 %
Platelets: 55 10*3/uL — ABNORMAL LOW (ref 150–400)
RBC: 3.47 MIL/uL — ABNORMAL LOW (ref 3.87–5.11)
RDW: 17.9 % — ABNORMAL HIGH (ref 11.5–15.5)
WBC: 9.7 10*3/uL (ref 4.0–10.5)

## 2017-06-13 LAB — PHOSPHORUS: PHOSPHORUS: 4.2 mg/dL (ref 2.5–4.6)

## 2017-06-13 LAB — PROTIME-INR
INR: 2.88
PROTHROMBIN TIME: 30.8 s — AB (ref 11.4–15.2)

## 2017-06-13 LAB — SEDIMENTATION RATE: SED RATE: 9 mm/h (ref 0–22)

## 2017-06-13 LAB — MAGNESIUM: Magnesium: 2 mg/dL (ref 1.7–2.4)

## 2017-06-13 LAB — C-REACTIVE PROTEIN: CRP: 17.4 mg/dL — ABNORMAL HIGH (ref <1.0)

## 2017-06-13 MED ORDER — SODIUM CHLORIDE 0.9 % IV BOLUS (SEPSIS)
500.0000 mL | Freq: Once | INTRAVENOUS | Status: AC
  Administered 2017-06-13: 500 mL via INTRAVENOUS

## 2017-06-13 MED ORDER — SODIUM CHLORIDE 0.9 % IV BOLUS (SEPSIS)
250.0000 mL | Freq: Once | INTRAVENOUS | Status: AC
  Administered 2017-06-13: 250 mL via INTRAVENOUS

## 2017-06-13 NOTE — Progress Notes (Signed)
Physical Therapy Treatment Patient Details Name: [name] MRN: [id number] DOB: [birth year] Today's Date: 06/13/2017    History of Present Illness 83 y.o. female with history of HTN, CAD, ASD repaired, permanent AF s/p AVN ablation (x2) w/PPM, on chronic coumadin, h/o diastolic chf, h/o venous insufficiency , chronic leg wound presented to the ED with confusion, weakness.  Dx includes UTI, acute hypoxic respiratory failure, AKI, FTT., acute on chronic diastolic heart failure      PT Comments    Pt is limited by pain and fatigue in today's PT session. Pt required modA for stand pivot transfer to [facility] and maxA for transfer back to recliner. Pt's son commented that his mother is just not able to find a comfortable position to get any rest. Pt requires skilled PT to progress her mobility and improve LE strength and endurance to safely mobilize in her home environment.    Follow Up Recommendations  Home health PT;Supervision/Assistance - 24 hour     Equipment Recommendations  None recommended by PT       Precautions / Restrictions Precautions Precautions: Fall Restrictions Weight Bearing Restrictions: No    Mobility  Bed Mobility               General bed mobility comments: in recliner at entry  Transfers Overall transfer level: Needs assistance Equipment used: 1 person hand held assist Transfers: Stand Pivot Transfers   Stand pivot transfers: Mod assist;Max assist       General transfer comment: modA for power up and pivoting to sit on BSC, vc for hand placement and powerup as well as sidestepping to [facility], maxA for transfer back to recliner due to fatigue, vc for stepping but unable to lift feet                         Balance Overall balance assessment: Needs assistance Sitting-balance support: Bilateral upper extremity supported;Feet unsupported Sitting balance-Leahy Scale: Poor     Standing balance support: Bilateral upper extremity  supported Standing balance-Leahy Scale: Zero                              Cognition Arousal/Alertness: Awake/alert Behavior During Therapy: WFL for tasks assessed/performed Overall Cognitive Status: History of cognitive impairments - at baseline                                 General Comments: pt's daughter aswered most questions about home living and independence, oriented to self and place      Exercises      General Comments General comments (skin integrity, edema, etc.): Pt on 10 L O2 and non rebreather mask, SaO2 >90%O2 throughout session, VSS, Pt son present throughout session      Pertinent Vitals/Pain Pain Assessment: Faces Faces Pain Scale: Hurts even more Pain Location: buttocks, all over with moving Pain Descriptors / Indicators: Aching;Constant    Home Living Family/patient expects to be discharged to:: Private residence Living Arrangements: Children Available Help at Discharge: Family;Available 24 hours/day Type of Home: House Home Access: Stairs to enter Entrance Stairs-Rails: None Home Layout: One level Home Equipment: Environmental consultant - 4 wheels;Tub bench      Prior Function Level of Independence: Independent with assistive device(s)      Comments: uses rollator for limited community ambulation, able to dress herself but daughter assists with iADLs  PT Goals (current goals can now be found in the care plan section) Acute Rehab PT Goals Patient Stated Goal: go home PT Goal Formulation: With patient Time For Goal Achievement: 06/12/17 Potential to Achieve Goals: Fair Progress towards PT goals: Not progressing toward goals - comment (pt with more pain and fatigue today)    Frequency    Min 3X/week      PT Plan Current plan remains appropriate       AM-PAC PT "6 Clicks" Daily Activity  Outcome Measure  Difficulty turning over in bed (including adjusting bedclothes, sheets and blankets)?: Total Difficulty moving from lying  on back to sitting on the side of the bed? : Total Difficulty sitting down on and standing up from a chair with arms (e.g., wheelchair, bedside commode, etc,.)?: Total Help needed moving to and from a bed to chair (including a wheelchair)?: A Lot Help needed walking in hospital room?: Total Help needed climbing 3-5 steps with a railing? : Total 6 Click Score: 7    End of Session Equipment Utilized During Treatment: Gait belt;Oxygen Activity Tolerance: Patient tolerated treatment well Patient left: in chair;with call bell/phone within reach;with nursing/sitter in room;with family/visitor present Nurse Communication: Mobility status PT Visit Diagnosis: Unsteadiness on feet (R26.81);Other abnormalities of gait and mobility (R26.89);History of falling (Z91.81);Muscle weakness (generalized) (M62.81);Difficulty in walking, not elsewhere classified (R26.2)     Time: 3151-7616 PT Time Calculation (min) (ACUTE ONLY): 35 min  Charges:  $Therapeutic Activity: 23-37 mins                    G Codes:       Tiffney Haughton B. Migdalia Dk PT, DPT Acute Rehabilitation  [phone] Pager [phone]     [place] 06/13/2017, 2:10 PM

## 2017-06-13 NOTE — Progress Notes (Signed)
Responded to [place] consult to support patient and family at bedside. Patient sitting up but not able to communicate due to oxogen mask but alert and aware.  Palliative is involved.  Patient son said that family is accepting of patient condition and celebrate her.also that family has had conversation with patient about her wishes. . Provided emotional and spiritual support and presence.Chaplain available as needed    06/13/17 1556  Clinical Encounter Type  Visited With Patient and family together;Health care provider  Visit Type Initial;Spiritual support;Patient actively dying  Referral From Nurse  Spiritual Encounters  Spiritual Needs Prayer;Emotional  Stress Factors  Family Stress Factors None identified  Cristopher Peru, [facility], Pager [phone]

## 2017-06-13 NOTE — Progress Notes (Addendum)
Progress Note  Patient Name: [name] Date of Encounter: 06/13/2017  Primary Cardiologist: Dr. [name]  Subjective   Sleeping comfortably.  Family has agreed with Palliative care consult  Inpatient Medications    Scheduled Meds: . calcium-vitamin D  2 tablet Oral Daily  . cholecalciferol  1,000 Units Oral Daily  . ferrous sulfate  325 mg Oral Q breakfast  . hydrocortisone   Rectal BID  . lidocaine  1 patch Transdermal Q24H  . mouth rinse  15 mL Mouth Rinse BID  . omega-3 acid ethyl esters  1 g Oral QPM  . vitamin C  1,000 mg Oral Daily  . vitamin E  400 Units Oral Daily  . Warfarin - Pharmacist Dosing Inpatient   Does not apply q1800   Continuous Infusions: . sodium chloride 250 mL (06/13/17 0929)  . [START ON 06/14/2017] vancomycin     PRN Meds: acetaminophen, acetaminophen, morphine injection, traMADol   Vital Signs    Vitals:   06/12/17 1637 06/12/17 2016 06/13/17 0012 06/13/17 0539  BP: (!) 96/45 (!) 91/45 (!) 87/70 (!) 76/61  Pulse:  74 75 75  Resp:  (!) 22 (!) 28 18  Temp: 98.5 F (36.9 C) 99.7 F (37.6 C) 99.9 F (37.7 C)   TempSrc: Oral Axillary Axillary   SpO2: 97%  97% 100%  Weight:        Intake/Output Summary (Last 24 hours) at 06/13/17 1017 Last data filed at 06/13/17 5859  Gross per 24 hour  Intake              180 ml  Output               53 ml  Net              127 ml   Filed Weights   06/04/2017 1331  Weight: 90 lb (40.8 kg)    Telemetry    NSR - Personally Reviewed  ECG    No new EKG to review - Personally Reviewed  Physical Exam   GEN: No acute distress.   Neck: No JVD Cardiac: irregularly irregular, no murmurs, rubs, or gallops.  Respiratory: Clear to auscultation bilaterally. GI: Soft, nontender, non-distended  MS: No edema; No deformity. Neuro:  Nonfocal  Psych: Normal affect   Labs    Chemistry Recent Labs Lab 06/05/2017 1612 06/12/17 0314 06/13/17 0516  NA 139 137 137  K 5.2* 5.0 4.2  CL 104 105 106  CO2  23 21* 23  GLUCOSE 89 68 86  BUN 24* 28* 47*  CREATININE 1.32* 1.36* 1.64*  CALCIUM 9.1 8.4* 7.8*  PROT 6.2* 5.9* 5.2*  ALBUMIN 3.3* 2.9* 2.6*  AST 82* 83* 76*  ALT 17 19 18   ALKPHOS 75 66 57  BILITOT 1.6* 1.5* 1.6*  GFRNONAA 33* 32* 25*  GFRAA 38* 37* 29*  ANIONGAP 12 11 8      Hematology Recent Labs Lab 05/26/2017 1612 06/12/17 0314 06/13/17 0516  WBC 10.1 11.7* 9.7  RBC 3.67* 3.71* 3.47*  HGB 13.2 13.4 12.5  HCT 40.2 40.4 36.8  MCV 109.5* 108.9* 106.1*  MCH 36.0* 36.1* 36.0*  MCHC 32.8 33.2 34.0  RDW 18.1* 18.3* 17.9*  PLT 59* 50* 55*    Cardiac EnzymesNo results for input(s): TROPONINI in the last 168 hours.  Recent Labs Lab 05/19/2017 1639  TROPIPOC 0.21*     BNPNo results for input(s): BNP, PROBNP in the last 168 hours.   DDimer No results for input(s): DDIMER  in the last 168 hours.   Radiology    Dg Chest 2 View  Result Date: 05/21/2017 CLINICAL DATA:  Shortness of breath . EXAM: CHEST  2 VIEW COMPARISON:  04/18/2017. FINDINGS: Cardiac pacer with lead tips in right atrium right ventricle. Stable cardiomegaly. Interval clearing of right base infiltrate. Persistent low lung volumes and bilateral pleural effusions. No pneumothorax . IMPRESSION: 1. Interim clearing of right base infiltrate. 2. Persistent bibasilar atelectasis and small pleural effusions. 3. Cardiac pacer stable position.  Stable cardiomegaly. Electronically Signed   By: Marcello Moores  Register   On: 05/27/2017 14:58   Dg Tibia/fibula Right Port  Result Date: 06/12/2017 CLINICAL DATA:  Wound abscess. EXAM: PORTABLE RIGHT TIBIA AND FIBULA - 2 VIEW COMPARISON:  09/30/2015 FINDINGS: The bones are diffusely osteopenic. No fracture is identified. The knee and ankle are located. No osseous erosion is seen to suggest active osteomyelitis. Diffuse vascular calcifications are noted. No soft tissue gas is seen. IMPRESSION: No evidence of active osteomyelitis or fracture. Electronically Signed   By: Logan Bores M.D.    On: 06/12/2017 20:36    Cardiac Studies   2D echo 06/12/2017 Study Conclusions  - Left ventricle: The cavity size was normal. Wall thickness was   normal. Systolic function was normal. The estimated ejection   fraction was in the range of 55% to 60%. Wall motion was normal;   there were no regional wall motion abnormalities. Features are   consistent with a pseudonormal left ventricular filling pattern,   with concomitant abnormal relaxation and increased filling   pressure (grade 2 diastolic dysfunction). - Ventricular septum: D-shaped interventricular septum suggestive   of RV pressure/volume overload. - Aortic valve: Trileaflet; moderately calcified leaflets. There   was mild stenosis. There was trivial regurgitation. Mean gradient   (S): 9 mm Hg. - Mitral valve: Severely calcified annulus. Moderately calcified   leaflets. There is a possible vegetation (small) noted near the   tip of the posterior leaflet. There was moderate to severe   regurgitation. - Left atrium: The atrium was severely dilated. - Right ventricle: The cavity size was moderately dilated. Pacer   wire or catheter noted in right ventricle. Systolic function was   moderately reduced. - Right atrium: The atrium was severely dilated. In one view (image   51), a small mobile mass occasionally flickers into view in the   right atrium, ?vegetation on pacemaker lead. - Tricuspid valve: There was moderate regurgitation. Peak RV-RA   gradient (S): 51 mm Hg. - Pulmonary arteries: PA peak pressure: 59 mm Hg (S). - Systemic veins: IVC measured 2.1 cm with < 50% respirophasic   variation, suggesting RA pressure 8 mmHg. - Pericardium, extracardiac: A trivial pericardial effusion was   identified.  Impressions:  - Normal LV size with EF 55-60%. Moderate diastolic dysfunction.   Moderately dilated RV with moderately decreased systolic   function. D-shaped interventricular septum suggestive of RV   pressure/volume  overload. Mild aortic stenosis. Heavily calcified   mitral valve and annulus with moderate to severe mitral   regurgitation. I am concerned that there may be a small   vegetation at the tip of the posterior leaflet. Moderate   pulmonary hypertension. Severe biatrial enlargement. Moderate TR.   There is a mobile mass noted flickering in and out of view in one   image of the right atrium. Possible vegetation on pacemaker lead.   This patient needs a TEE.   Patient Profile     83 y.o. female  a PMH significant for HTN, HLD, CAD, hx of ASD repair, permanent atrial fibrillation s/p AVN ablation x 2 and PPM  Placement (placed 1996 with generator change 10/2008, on coumadin, hx of diastolic heart failure, hx of venous insufficiency, and chronic leg wounds. She was admitted with UTI and was given IVF in ED with resultant SOB.  Assessment & Plan    1. Acute on chronic diastolic heart failure - recent CAP infection with accompanying fluids may have contributed to volume overload leading to increased lasix regimen at last clinic visit - per family, dry weight is 84 lbs, she was 90 lbs on admission, it is unclear if this is a bed weight - she is overall net positive 53cc and net positive 177cc - 2D echo with normal LVF and diastolic dysfunction - her creatinine is rising and would not give any further diuretics - she is dying from multiorgan failure and sepsis - recommend Palliative Care consult as we have nothing more to recommend- this was discussed with family  2. Permanent Atrial fibrillation s/p AVN ablation x 2 with PPM - PPM working properly 06/04/17, she is pacer-dependent - This patients CHA2DS2-VASc Score and unadjusted Ischemic Stroke Rate (% per year) is equal to 7.2 % stroke rate/year from a score of 5 (CHF, HTN, age, female) - continue coumadin - per daughter, has had three falls in the past 12 months, but without major injury - in setting of thrombocytopenia would stop  anticoagulation as she is at high risk of bleeding complications  3. Thrombocytopenia - PLT 55 (59 on admission) - this appears to be a chronic problem for her, PLT 85 on 04/18/17 and 105 on 04/02/17 but likely worsened by sepsis - per primary team  4. Acute on chronic kidney injury stage III -sCr 1.64 today, baseline appears to be 0.77-0.88 - per primary team - would not give any further diuretics  5.  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 - Agree with EP that patient not a candidate for pacer extraction due to advanced age and comorbidities as well as severe thrombocytopenia  Recommend Palliative Care as she is at end of life.  No other recs at this time.  Will sign off.    Signed, Fransico Him, MD  06/13/2017, 10:17 AM

## 2017-06-13 NOTE — Progress Notes (Signed)
Placed pt. On an 80% face tent per MD request (for pt. Comfort) Pt. Did not tolerate face tent & family requested that pt. Be placed back on NRB.

## 2017-06-13 NOTE — Consult Note (Signed)
Consultation Note Date: 06/13/2017   Patient Name: [name]  DOB: 05/10/1919  MRN: [id number]  Age / Sex: 83 y.o., female  PCP: Golden Circle, FNP Referring Physician: Kerney Elbe, DO  Reason for Consultation: Establishing goals of care  HPI/Patient Profile: is a 83 y.o. female with history of HTN, CAD,  h/o ASD repaired, permanent AF s/p AVN ablation (x2) w/PPM, on chronic coumadin, h/o diastolic chf, h/o venous insufficiency , chronic leg wound. Daughter reports patient was recently treated for community acquired pneumonia.It was noted that the patient became hypoxia, report feeling sob, she was put on NRB and admitted to stepdown.  The palliative care team was consulted for goals of care. We met today with the patient's daughter and son at bedside. She lives with her daughter at home. We discussed her overall failure to thrive and decompensation over the last few months.   The family indicated that the patient would not like to be resuscitated and preferred a DO NOT RESUSCITATE/DO NOT INTUBATE status. Dr. [name] came in to speak to the patient during our visit and discussed care planning. The decision was made to continue current therapies and follow the patient on a day by day basis while keeping the patient's comfort, quality, and dignity as the focus.  Per EMR patient was agitated overnight receiving a dose of Ativan. The patient was still noticed to be sleepy this morning.  Clinical Assessment and Goals of Care:  This NP Kennieth Rad reviewed medical records, received report from team, assessed the patient and then meet at the patient's bedside along with daughter and son  to discuss diagnosis, prognosis, GOC, EOL wishes disposition and options.  A detailed discussion was had today regarding advanced directives.  Concepts specific to code status, artifical feeding and  hydration, continued IV antibiotics and rehospitalization was had.  The difference between a aggressive medical intervention path  and a palliative comfort care path for this patient at this time was had.  Values and goals of care important to patient and family were discussed. The decision to implement a DO NOT RESUSCITATE/DO NOT INTUBATE was made however the family would prefer to continue with the current healthcare regimen and aggressive care measures at this time.  The natural trajectory and expectations at EOL were discussed.  Questions and concerns addressed.   Family encouraged to call with questions or concerns.  PMT will continue to support holistically.   SUMMARY OF RECOMMENDATIONS   Code Status/Advance Care Planning:  DNR    Daughter and son are at bedside and endorse that [name] has power of attorney papers which lists them as power of attorney for this patient. They are the only children and next of kin. They were encouraged to bring the documentation in.  Additional Recommendations (Limitations, Scope, Preferences):  She wishes to continue antibiotics and eating. The family continues to hope for improvement.   Psycho-social/Spiritual:   Desire for further Chaplaincy support: yes  Prognosis:   Unable to determine--Long term prognosis is  poor.  High risk for continued decompensation.   Likely not to survive this hospitalization.   Discharge Planning: To Be Determined      Primary Diagnoses: Present on Admission: . Hypoxia . Acute hypoxemic respiratory failure ([place]) . Atrial fibrillation ([place]) . Coronary atherosclerosis . HYPERCHOLESTEROLEMIA . Chronic pain syndrome . BACK PAIN, LUMBAR . Permanent atrial fibrillation ([place]) . Bacteriuria with pyuria   I have reviewed the medical record, interviewed the patient and family, and examined the patient. The following aspects are pertinent.  Past Medical History:  Diagnosis Date  . Anemia   . AV block    s/p PPM  .  CAD (coronary artery disease)   . Chronic pain syndrome   . Diverticulosis of colon (without mention of hemorrhage)   . DJD (degenerative joint disease)   . LBP (low back pain)   . Osteoporosis    pelvic fx 10/2013 s/p fall  . Ostium secundum type atrial septal defect   . Overactive bladder   . Pure hypercholesterolemia   . Venous insufficiency    Social History   Social History  . Marital status: Widowed    Spouse name: N/A  . Number of children: 2  . Years of education: N/A   Occupational History  . Retired   .  Retired   Social History Main Topics  . Smoking status: Never Smoker  . Smokeless tobacco: Never Used  . Alcohol use No     Comment: social use  . Drug use: No  . Sexual activity: No   Other Topics Concern  . None   Social History Narrative   Lives with daughter    Daughter works in day    Family History  Problem Relation Age of Onset  . Heart disease Mother   . Heart failure Mother   . Emphysema Father   . Breast cancer Sister   . Heart attack Neg Hx   . Stroke Neg Hx    Scheduled Meds: . calcium-vitamin D  2 tablet Oral Daily  . cholecalciferol  1,000 Units Oral Daily  . ferrous sulfate  325 mg Oral Q breakfast  . hydrocortisone   Rectal BID  . lidocaine  1 patch Transdermal Q24H  . mouth rinse  15 mL Mouth Rinse BID  . omega-3 acid ethyl esters  1 g Oral QPM  . vitamin C  1,000 mg Oral Daily  . vitamin E  400 Units Oral Daily  . Warfarin - Pharmacist Dosing Inpatient   Does not apply q1800   Continuous Infusions: . sodium chloride 250 mL (06/13/17 0929)  . [START ON 06/14/2017] vancomycin     PRN Meds:.acetaminophen, acetaminophen, morphine injection, traMADol Medications Prior to Admission:  Prior to Admission medications   Medication Sig Start Date End Date Taking? Authorizing Provider  acetaminophen (TYLENOL) 500 MG tablet Take 650 mg by mouth every 8 (eight) hours as needed. Take with tramadol as needed for pain   Yes [provider]  Ascorbic Acid (VITAMIN C) 1000 MG tablet Take 1,000 mg by mouth daily.     Yes [provider]  Biotin 1000 MCG tablet Take 1,000 mcg by mouth daily.     Yes [provider]  Calcium Carbonate-Vitamin D (CALCIUM 600+D) 600-400 MG-UNIT per tablet Take 2 tablets by mouth daily.     Yes [provider]  Cholecalciferol (VITAMIN D) 1000 UNITS capsule Take 1,000 Units by mouth daily.     Yes [provider]  ferrous sulfate  325 (65 FE) MG tablet Take 325 mg by mouth daily with breakfast.    Yes [provider]  furosemide (LASIX) 40 MG tablet Take 80 mg by mouth.   Yes [provider]  hydrocortisone (ANUSOL-HC) 2.5 % rectal cream Apply rectally 2 times daily 04/18/17  Yes Tegeler, Gwenyth Allegra, MD  Omega-3 350 MG CAPS Take 1 capsule by mouth every evening.    Yes [provider]  omeprazole (PRILOSEC) 20 MG capsule Take 20 mg by mouth daily as needed (acid reflux).    Yes [provider]  potassium chloride (K-DUR) 10 MEQ tablet Take 30 mEq by mouth daily.   Yes [provider]  tolterodine (DETROL) 2 MG tablet Take 2 mg by mouth 2 (two) times daily. 08/25/15  Yes [provider]  traMADol (ULTRAM) 50 MG tablet Take 1 tablet (50 mg total) by mouth every 6 (six) hours as needed for moderate pain. 10/25/13  Yes [name], MD  vitamin A 8000 UNIT capsule Take 8,000 Units by mouth every other day.     Yes [provider]  vitamin E 400 UNIT capsule Take 400 Units by mouth daily.     Yes [provider]  warfarin (COUMADIN) 5 MG tablet TAKE AS DIRECTED BY COUMADIN CLINIC 03/29/16  Yes [name], MD  methylPREDNISolone (MEDROL DOSEPAK) 4 MG TBPK tablet Take as prescribed. 04/24/17   Golden Circle, FNP   Allergies  Allergen Reactions  . Amiodarone Hcl     REACTION: INTOL to Amiodarone in past  . Codeine Nausea And Vomiting  . Fentanyl     REACTION: causes  nausea--dizziness  . Lamisil [Terbinafine Hcl]     Causes nausea  . Other    Review of Systems  Constitutional: Positive for activity change, appetite change and unexpected weight change.  Skin: Positive for wound.  Psychiatric/Behavioral: Positive for sleep disturbance.    Physical Exam  Constitutional:  Thin and frail  HENT:  Head: Normocephalic and atraumatic.  Eyes: EOM are normal.  Cardiovascular:  Warm and dry  Pulmonary/Chest: Effort normal.  Abdominal: She exhibits no distension.  Neurological:  Easily arousable    Vital Signs: BP (!) 76/61   Pulse 75   Temp 99.9 F (37.7 C) (Axillary)   Resp 18   Wt 40.8 kg (90 lb)   LMP  (LMP Unknown)   SpO2 100%   BMI 19.48 kg/m  Pain Assessment: 0-10   Pain Score: 5    SpO2: SpO2: 100 % O2 Device:SpO2: 100 % O2 Flow Rate: .O2 Flow Rate (L/min): 5 L/min  IO: Intake/output summary:  Intake/Output Summary (Last 24 hours) at 06/13/17 1001 Last data filed at 06/13/17 3007  Gross per 24 hour  Intake              180 ml  Output               53 ml  Net              127 ml    LBM: Last BM Date: 05/26/2017 Baseline Weight: Weight: 40.8 kg (90 lb) Most recent weight: Weight: 40.8 kg (90 lb)     Palliative Assessment/Data: 20%   Discussed with Dr Alfredia Ferguson  Time In: 0800 Time Out: 0915 Time Total: 75 min  Greater than 50%  of this time was spent counseling and coordinating care related to the above assessment and plan.  Signed by: Asencion Gowda, NP   Please contact Palliative  Medicine Team phone at [phone] for questions and concerns.  For individual provider: See Shea Evans

## 2017-06-13 NOTE — Progress Notes (Addendum)
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 Possible vegetation on pacemaker lead. Possible small vegetation near the posterior leaflet of mitral valve. Moderately calcified aortic leaflet, severely calcified mitral annulus. -Wound care for right leg wound  -consider xray of leg for possible osteomyelitis  Pyuria: The patient does not have a uti that needs to be treated.  Goals of care: Palliative care was consulted. Patient and patient's family have opted to not remove her pacemaker and proceed with long-term antibiotic therapy.  -The patient has a high IE mortality risk of approximately 35-50% within <6 months per IE mortality risk score   . calcium-vitamin D  2 tablet Oral Daily  . cholecalciferol  1,000 Units Oral Daily  . ferrous sulfate  325 mg Oral Q breakfast  . hydrocortisone   Rectal BID  . lidocaine  1 patch Transdermal Q24H  . mouth rinse  15 mL Mouth Rinse BID  . omega-3 acid ethyl esters  1 g Oral QPM  . vitamin C  1,000 mg Oral Daily  . vitamin E  400 Units Oral Daily  . Warfarin - Pharmacist Dosing Inpatient   Does not apply q1800    SUBJECTIVE: The patient was laying in her bed with non-rebreather face mask on. She said she is doing well and understands her treatment plan. Her daughter and son who were at bedside mentioned that they spoke to palliative care and with the patient and have opted for her pacemaker to not be removed.   Review of Systems: ROS unremarkable except  above  Allergies  Allergen Reactions  . Amiodarone Hcl     REACTION: INTOL to Amiodarone in past  . Codeine Nausea And Vomiting  . Fentanyl     REACTION: causes nausea--dizziness  . Lamisil [Terbinafine Hcl]     Causes nausea  . Other     OBJECTIVE: Vitals:   06/12/17 1637 06/12/17 2016 06/13/17 0012 06/13/17 0539  BP: (!) 96/45 (!) 91/45 (!) 87/70 (!) 76/61  Pulse:  74 75 75  Resp:  (!) 22 (!) 28 18  Temp: 98.5 F (36.9 C) 99.7 F (37.6 C) 99.9 F (37.7 C)   TempSrc: Oral Axillary Axillary   SpO2: 97%  97% 100%  Weight:       Body mass index is 19.48 kg/m.  Physical Exam  Constitutional: She appears malnourished. She appears unhealthy. She appears cachectic. She has a sickly appearance. She appears distressed.  HENT:  Head: Normocephalic and atraumatic.  Cardiovascular: An irregularly irregular rhythm present.  Pulmonary/Chest: She is in respiratory distress. She exhibits no tenderness.  Skin: Bruising and ecchymosis noted.  Presence of osler notes on hands, no janeway lesions  Skin breakdown present on the left knee, right foot, and on left arm at site of iv      Lab Results Lab Results  Component Value Date   WBC 9.7 06/13/2017   HGB 12.5 06/13/2017  HCT 36.8 06/13/2017   MCV 106.1 (H) 06/13/2017   PLT 55 (L) 06/13/2017    Lab Results  Component Value Date   CREATININE 1.64 (H) 06/13/2017   BUN 47 (H) 06/13/2017   NA 137 06/13/2017   K 4.2 06/13/2017   CL 106 06/13/2017   CO2 23 06/13/2017    Lab Results  Component Value Date   ALT 18 06/13/2017   AST 76 (H) 06/13/2017   ALKPHOS 57 06/13/2017   BILITOT 1.6 (H) 06/13/2017     Microbiology: Recent Results (from the past 240 hour(s))  Culture, blood (routine x 2)     Status: None (Preliminary result)   Collection Time: 05/13/2017  4:01 PM  Result Value Ref Range Status   Specimen Description BLOOD RIGHT WRIST  Final   Special Requests IN PEDIATRIC BOTTLE Blood Culture adequate volume  Final    Culture  Setup Time   Final    GRAM POSITIVE COCCI IN CLUSTERS AEROBIC BOTTLE ONLY CRITICAL VALUE NOTED.  VALUE IS CONSISTENT WITH PREVIOUSLY REPORTED AND CALLED VALUE.    Culture GRAM POSITIVE COCCI  Final   Report Status PENDING  Incomplete  Culture, blood (routine x 2)     Status: None (Preliminary result)   Collection Time: 05/21/2017  4:12 PM  Result Value Ref Range Status   Specimen Description BLOOD RIGHT ANTECUBITAL  Final   Special Requests   Final    BOTTLES DRAWN AEROBIC AND ANAEROBIC Blood Culture adequate volume   Culture  Setup Time   Final    GRAM POSITIVE COCCI IN CLUSTERS IN BOTH AEROBIC AND ANAEROBIC BOTTLES Organism ID to follow CRITICAL RESULT CALLED TO, READ BACK BY AND VERIFIED WITH: MVonita Moss.D. 10:15 06/12/17 ([email or web address])    Culture GRAM POSITIVE COCCI  Final   Report Status PENDING  Incomplete  Blood Culture ID Panel (Reflexed)     Status: Abnormal   Collection Time: 06/03/2017  4:12 PM  Result Value Ref Range Status   Enterococcus species NOT DETECTED NOT DETECTED Final   Listeria monocytogenes NOT DETECTED NOT DETECTED Final   Staphylococcus species DETECTED (A) NOT DETECTED Final    Comment: CRITICAL RESULT CALLED TO, READ BACK BY AND VERIFIED WITH: M. Vonita Moss.D. 10:15 06/12/17 ([email or web address])    Staphylococcus aureus DETECTED (A) NOT DETECTED Final    Comment: Methicillin (oxacillin)-resistant Staphylococcus aureus (MRSA). MRSA is predictably resistant to beta-lactam antibiotics (except ceftaroline). Preferred therapy is vancomycin unless clinically contraindicated. Patient requires contact precautions if  hospitalized. CRITICAL RESULT CALLED TO, READ BACK BY AND VERIFIED WITH: M. Radford Pax Pharm.D. 10:15 06/12/17 ([email or web address])    Methicillin resistance DETECTED (A) NOT DETECTED Final    Comment: CRITICAL RESULT CALLED TO, READ BACK BY AND VERIFIED WITH: M. Radford Pax Pharm.D. 10:15 06/12/17 ([email or web address])    Streptococcus species NOT DETECTED NOT DETECTED Final    Streptococcus agalactiae NOT DETECTED NOT DETECTED Final   Streptococcus pneumoniae NOT DETECTED NOT DETECTED Final   Streptococcus pyogenes NOT DETECTED NOT DETECTED Final   Acinetobacter baumannii NOT DETECTED NOT DETECTED Final   Enterobacteriaceae species NOT DETECTED NOT DETECTED Final   Enterobacter cloacae complex NOT DETECTED NOT DETECTED Final   Escherichia coli NOT DETECTED NOT DETECTED Final   Klebsiella oxytoca NOT DETECTED NOT DETECTED Final   Klebsiella pneumoniae NOT DETECTED NOT DETECTED Final   Proteus species NOT DETECTED NOT DETECTED Final   Serratia marcescens NOT DETECTED NOT DETECTED Final   Haemophilus influenzae NOT DETECTED NOT DETECTED Final   Neisseria meningitidis NOT  DETECTED NOT DETECTED Final   Pseudomonas aeruginosa NOT DETECTED NOT DETECTED Final   Candida albicans NOT DETECTED NOT DETECTED Final   Candida glabrata NOT DETECTED NOT DETECTED Final   Candida krusei NOT DETECTED NOT DETECTED Final   Candida parapsilosis NOT DETECTED NOT DETECTED Final   Candida tropicalis NOT DETECTED NOT DETECTED Final  MRSA PCR Screening     Status: None   Collection Time: 06/12/17  4:36 AM  Result Value Ref Range Status   MRSA by PCR NEGATIVE NEGATIVE Final    Comment:        The GeneXpert MRSA Assay (FDA approved for NASAL specimens only), is one component of a comprehensive MRSA colonization surveillance program. It is not intended to diagnose MRSA infection nor to guide or monitor treatment for MRSA infections.     Lars Mage, [place] [facility] [phone] pager   [phone] cell 06/13/2017, 9:19 AM        INFECTIOUS DISEASE ATTENDING ADDENDUM:   Date: 06/13/2017  Patient name: [name]  Medical record number: [id number]  Date of birth: 03-20-1919    This patient has been seen and discussed with the house staff. Please see the resident's note for complete details. I concur with their  findings with the following additions/corrections:  THIS IS MY SECOND ATTEMPT AT ADDENDUM  I spent greater than 35 minutes counselling the pt son, grandson  I am very worried that the patient is NOT going to survive this hospitalization  Her renal fxn is worsening on vancomycin and we may need to go to another options such as Teflaro >> daptomycin given pulmonary involvement  We can consider also unorthodox options such as IV ORITAVANCIN    Alcide Evener 06/13/2017, 6:48 PM

## 2017-06-13 NOTE — Progress Notes (Signed)
ANTICOAGULATION CONSULT NOTE - Follow Up Consult  Pharmacy Consult for Warfarin  Indication: atrial fibrillation  Allergies  Allergen Reactions  . Amiodarone Hcl     REACTION: INTOL to Amiodarone in past  . Codeine Nausea And Vomiting  . Fentanyl     REACTION: causes nausea--dizziness  . Lamisil [Terbinafine Hcl]     Causes nausea  . Other    Patient Measurements: Weight: 90 lb (40.8 kg)  Vital Signs: Temp: 99.9 F (37.7 C) (08/02 0012) Temp Source: Axillary (08/02 0012) BP: 76/61 (08/02 0539) Pulse Rate: 75 (08/02 0539)  Labs:  Recent Labs  05/14/2017 1612 06/12/17 0125 06/12/17 0314 06/13/17 0516  HGB 13.2  --  13.4 12.5  HCT 40.2  --  40.4 36.8  PLT 59*  --  50* 55*  LABPROT  --  35.5* 34.5* 30.8*  INR  --  3.44 3.32 2.88  CREATININE 1.32*  --  1.36* 1.64*    Estimated Creatinine Clearance: 12.2 mL/min (A) (by C-G formula based on SCr of 1.64 mg/dL (H)).   Medical History: Past Medical History:  Diagnosis Date  . Anemia   . AV block    s/p PPM  . CAD (coronary artery disease)   . Chronic pain syndrome   . Diverticulosis of colon (without mention of hemorrhage)   . DJD (degenerative joint disease)   . LBP (low back pain)   . Osteoporosis    pelvic fx 10/2013 s/p fall  . Ostium secundum type atrial septal defect   . Overactive bladder   . Pure hypercholesterolemia   . Venous insufficiency     Assessment: 83 y/o F here with shortness of breath and confusion, continuing warfarin PTA for afib, INR is supra-therapeutic at 3.44 on admit> 2.8 after hold x1 will hold again today and f/u restart in am (lower goal of 1.8-2.4 per outpatient anti-coag notes). Hgb 13.2, plts low at 59.   Goal of Therapy:  INR 1.8-2.4 (per outpatient anti-coag notes) Monitor platelets by anticoagulation protocol: Yes   Plan:  -No warfarin again tonight  -Daily PT/INR -Re-start warfarin as INR allows  Bonnita Nasuti Pharm.D. CPP, BCPS Clinical  Pharmacist [phone] 06/13/2017 8:17 AM

## 2017-06-13 NOTE — Progress Notes (Signed)
Patient was very restless and agitated. Family requested med to calm her down and help her rest. Provider notified and low dose ativan given and effective. Patient has rested well with family at bedside.

## 2017-06-13 NOTE — Care Management Note (Signed)
Case Management Note  Patient Details  Name: [name] MRN: [id number] Date of Birth: 1919-01-01  Subjective/Objective:  Pt admitted with weakness and SOB, MSSA Bacteremia and possible vegetation with pacemaker infection (pacemaker will not be removed per cardiology)                Action/Plan:  PTA from home with daughter (children are caregivers and POAs).  Palliative care is involved - pt has been switched to DNR but family would like to remain aggressive with treatments.  HHPT recommended - CM requested attending to write order and face to face so home health can be arranged.     Expected Discharge Date:  06/13/17               Expected Discharge Plan:  [place]  In-House Referral:     Discharge planning Services  CM Consult  Post Acute Care Choice:    Choice offered to:     DME Arranged:    DME Agency:     HH Arranged:    HH Agency:     Status of Service:     If discussed at [employer] [facility] [employer], dates discussed:    Additional Comments:  Maryclare Labrador, RN 06/13/2017, 2:20 PM

## 2017-06-13 NOTE — Progress Notes (Signed)
PROGRESS NOTE    [name]  PXT:[id number] DOB: 01-27-1919 DOA: 06/07/2017 PCP: [facility], FNP  Brief Narrative:  [name] is a 83 y.o. female with history of HTN, CAD,  h/o ASD repair, permanent AF s/p AVN ablation (x2) w/PPM, on chronic coumadin, h/o Diastolic CHF, h/o venous insufficiency, chronic leg wound presented to the ED due to above complaints. Daughter reports patient was recently treated for community acquired pneumonia, then got fluids overload. Her cardiologist recently increased her lasix to 61m daily a month ago. Per chart review, she was just seen by cardiology on 7/24 with "OK"volume status and renal function.Daughter report patient weight still up about 1.5lbs (84.5lbs) from target weight (83lbs) She is brought to the ED due to sob, weakness and confusion. She was found to have a MRSA bacteremia and ID, Cardiology, Electrophysiology, and Palliative Care Medicine were consulted for further evaluation and recommendations. Palliative Care met with the family and patient's code status was changed to DNR. Her Cr increased so she was given a small Fluid Bolus. Cardiology signed off as they have nothing else to offer and ID may consider changing Abx given Cr Worsening. Prognosis is extremely poor and likely in hospital death is anticipated.   Assessment & Plan:   Principal Problem:   Pacemaker infection ([place] Active Problems:   HYPERCHOLESTEROLEMIA   Chronic pain syndrome   Coronary atherosclerosis   BACK PAIN, LUMBAR   Atrial fibrillation (HCC)   Permanent atrial fibrillation (HCC)   Bacteriuria with pyuria   Hypoxia   Acute hypoxemic respiratory failure (HCC)   Acute diastolic CHF (congestive heart failure) (HCC)   MRSA bacteremia   Pyuria   AKI (acute kidney injury) ([facility]   Thrombocytopenia (HCC)  Confusion, likely Metabolic encephalopathy from Infectious Etiology -Family report patient is more confused than her baseline, hard to get any history from  patient -Continue to Monitor -SLP ordered and recommending resuming a regular consistency diet and thin liquids and continuing cruhsing meds with puree -Likely from Bacteremia and Infection of Pacer    MRSA Bacteremia -Patient has 2/2 Blood Cx positive for MRSA; Repeat Cx pending  -Pacemaker is likely also infected. Electrophysiologist was consulted and they recommend against extraction and recommending palliative Abx Strategy -Transthoracic ECHO was done and showed Normal LV size with EF 55-60%. Moderate diastolic dysfunction. Moderately dilated RV with moderately decreased systolic function. D-shaped interventricular septum suggestive of RV  pressure/volume overload. Mild aortic stenosis. Heavily calcified  mitral valve and annulus with moderate to severe mitral regurgitation. The Cardiologist reading the ECHO was concerned that there may be a small  vegetation at the tip of the posterior leaflet. Moderate pulmonary hypertension. Severe biatrial enlargement. Moderate TR. There is a mobile mass noted flickering in and out of view in one  image of the right atrium. Possible vegetation on pacemaker lead. -Will not Pursue TEE -IV Vancomycin startedand per ID would treat with Aggressive IV Abx for 6 weeks followed by lifelong Abx however Cr increasing so ID likely going to change Abx to IV Teflaro or IV Oritavancin per ID given Renal Fxn worsening on IV Vanc -Per ID do not place PICC until Blood Cx are NGTD at 4-5 days -WBC went from 10.1 -> 11.7 -> 9.7 -Lactate went from 4.02 -> 1.8 -Palliative Care Consulted for further Goals of Care and Code Status changed to DNR. Patient's Family would like to continue Abx for now; Prognosis is extremely poor and patient will not survive the hospitalization and anticipate  In Hospital Death given worsening condition.   Acute hypoxic respiratory failure Suspect from acute on chronic diastolic chf exacerbation and possible ?PNA  -She received iv lasix 43m in the ED  x1,  No further Diruesis  [facility] and recommending holding diuretics and obtaining a Transthoracic ECHO -Hypoxia worsening and after Palliative Discussion with Family no escalation of Care -Patient is changed to DNR/DNI -Patient was placed on 80% Face tent but did not tolerate so is back on NRB and saturating 86% -Prognosis is extremely poor and patient may not survive this hospitalization   Pyuria with Bacteruria -U/A with many bacteria, wbc TNTC, RBC TNTC, denies back pain, denies dysuria ( per daughter) -Urine culture pending -Rocephin Discontinued by Infectious Diseases as they do not believe she has a UTI  AKI, worsening  -Likley from Heart Failure -BUN/Cr went from 24/1.32 -> 24/1.32 -> 47/1.64 -IV Vancomycin maybe changed to Teflaro or IV Oritavancin per ID given Renal Fxn worsening on IV Vanc -Continue to Renally dose medications -Repeat CMP in AM  Thrombocytopenia:  -Acute on chronic, noted platelet started to trend down since 03/2017 -From liver congestion?  From acute infection? Less likely MAHA, no mention of schistocytes, no recent heparin exposure ( has been on coumadin),  -will check fractionated bili -Monitor, consider stop coumadin if plt less than 50. Platelet Count was 55 this AM  -No acute bleeding -Repeat CBC in AM  Mild elevation of lft, possibly from liver congestion -AST went from 83 -> 76 -ALT went from 19 -> 18 -Repeat CMP in AM  Hyperkalemia, improved -Improved as K+ went from 5.2 -> 4.2 -Continue to Monitor and Repeat CMP in AM  Chronic Afib, s/p pacemaker,  -Purrently paced rhythm -not on rate control agent at home but on Anticoagulation with Coumadin  -Pharmacy to dose coumadin -PT-INR was 30.8-2.88 -Repeat PT INR in AM  Chronic leg wounds -Suspect possibly the cause of the bacteremia -Wound care consulted  FTT -Has 24/7 care at home, likely will need home health at D/C if she survives this  Hospitalization  Mildly Elevated Troponin -Likely demand ischemia in the setting of Atrial Fibrillation and Infection -Cardiology Consulted for further evaluation and Recommendations  DVT prophylaxis: Anticoagulated with Coumadin Code Status: DO NOT RESUSCITATE Family Communication: Discussed with Daughter and Son at bedside Disposition Plan: Pending Clinical Course; Possible In Hospital Death   Consultants:   Infectious Diseases  Cardiology  Electrophysiology  Palliative Care Medicine   Procedures:  ECHOCARDIOGRAM Study Conclusions  - Left ventricle: The cavity size was normal. Wall thickness was   normal. Systolic function was normal. The estimated ejection   fraction was in the range of 55% to 60%. Wall motion was normal;   there were no regional wall motion abnormalities. Features are   consistent with a pseudonormal left ventricular filling pattern,   with concomitant abnormal relaxation and increased filling   pressure (grade 2 diastolic dysfunction). - Ventricular septum: D-shaped interventricular septum suggestive   of RV pressure/volume overload. - Aortic valve: Trileaflet; moderately calcified leaflets. There   was mild stenosis. There was trivial regurgitation. Mean gradient   (S): 9 mm Hg. - Mitral valve: Severely calcified annulus. Moderately calcified   leaflets. There is a possible vegetation (small) noted near the   tip of the posterior leaflet. There was moderate to severe   regurgitation. - Left atrium: The atrium was severely dilated. - Right ventricle: The cavity size was moderately dilated. Pacer   wire or  catheter noted in right ventricle. Systolic function was   moderately reduced. - Right atrium: The atrium was severely dilated. In one view (image   51), a small mobile mass occasionally flickers into view in the   right atrium, ?vegetation on pacemaker lead. - Tricuspid valve: There was moderate regurgitation. Peak RV-RA   gradient (S): 51  mm Hg. - Pulmonary arteries: PA peak pressure: 59 mm Hg (S). - Systemic veins: IVC measured 2.1 cm with < 50% respirophasic   variation, suggesting RA pressure 8 mmHg. - Pericardium, extracardiac: A trivial pericardial effusion was   identified.  Impressions:  - Normal LV size with EF 55-60%. Moderate diastolic dysfunction.   Moderately dilated RV with moderately decreased systolic   function. D-shaped interventricular septum suggestive of RV   pressure/volume overload. Mild aortic stenosis. Heavily calcified   mitral valve and annulus with moderate to severe mitral   regurgitation. I am concerned that there may be a small   vegetation at the tip of the posterior leaflet. Moderate   pulmonary hypertension. Severe biatrial enlargement. Moderate TR.   There is a mobile mass noted flickering in and out of view in one   image of the right atrium. Possible vegetation on pacemaker lead.   This patient needs a TEE.   Antimicrobials: Anti-infectives    Start     Dose/Rate Route Frequency Ordered Stop   06/14/17 1000  vancomycin (VANCOCIN) 500 mg in sodium chloride 0.9 % 100 mL IVPB     500 mg 100 mL/hr over 60 Minutes Intravenous Every 48 hours 06/12/17 0940     06/12/17 1000  vancomycin (VANCOCIN) IVPB 750 mg/150 ml premix     750 mg 150 mL/hr over 60 Minutes Intravenous  Once 06/12/17 0940 06/12/17 1100   06/02/2017 2145  cefTRIAXone (ROCEPHIN) 1 g in dextrose 5 % 50 mL IVPB  Status:  Discontinued     1 g 100 mL/hr over 30 Minutes Intravenous Every 24 hours 05/25/2017 2136 06/12/17 1039   05/21/2017 1545  cefTRIAXone (ROCEPHIN) 1 g in dextrose 5 % 50 mL IVPB     1 g 100 mL/hr over 30 Minutes Intravenous  Once 05/14/2017 1543 06/10/2017 1718     Subjective: Patient was seen and examined at bedside and was complaining of pain again. Was not interacting as much today as she did not have her hearing aids in. Per family she had a very rough night and was agitated. Seemed calmer this AM per  family.   Objective: Vitals:   06/12/17 1637 06/12/17 2016 06/13/17 0012 06/13/17 0539  BP: (!) 96/45 (!) 91/45 (!) 87/70 (!) 76/61  Pulse:  74 75 75  Resp:  (!) 22 (!) 28 18  Temp: 98.5 F (36.9 C) 99.7 F (37.6 C) 99.9 F (37.7 C)   TempSrc: Oral Axillary Axillary   SpO2: 97%  97% 100%  Weight:        Intake/Output Summary (Last 24 hours) at 06/13/17 0729 Last data filed at 06/13/17 5329  Gross per 24 hour  Intake              180 ml  Output               53 ml  Net              127 ml   Filed Weights   05/14/2017 1331  Weight: 40.8 kg (90 lb)   Examination: Physical Exam:  Constitutional: Thin cachectic elderly female appears calm  Eyes: Sclerae anicteric; Conjunctivae non-injected ENMT: Extremely hard of hearing. External Ears and nose appear normal Neck: Supple with No JVD Respiratory: Diminished with no appreciable expiratory wheezing but has coarse breath sounds; Wearing NRB Cardiovascular: Irregularly Irregular. Bilateral LE are wrapped with no appreciable edema Abdomen: Soft, NT, ND. Bowel sounds present GU: Deferred Musculoskeletal: No Contractures; No Cyanosis Skin: Bilateral LE are wrapped. Warm and dry. No rashes appreciated on limited skin eval Neurologic: Extremely hard of hearing and less interactive. No appreciable focal deficits Psychiatric: Impaired judgement and insight but alert but not oriented. Normal mood and affect.   Data Reviewed: I have personally reviewed following labs and imaging studies  CBC:  Recent Labs Lab 05/30/2017 1612 06/12/17 0314 06/13/17 0516  WBC 10.1 11.7* 9.7  NEUTROABS 9.7*  --  8.7*  HGB 13.2 13.4 12.5  HCT 40.2 40.4 36.8  MCV 109.5* 108.9* 106.1*  PLT 59* 50* 55*   Basic Metabolic Panel:  Recent Labs Lab 05/24/2017 1612 06/12/17 0314 06/13/17 0516  NA 139 137 137  K 5.2* 5.0 4.2  CL 104 105 106  CO2 23 21* 23  GLUCOSE 89 68 86  BUN 24* 28* 47*  CREATININE 1.32* 1.36* 1.64*  CALCIUM 9.1 8.4* 7.8*  MG   --   --  2.0  PHOS  --   --  4.2   GFR: Estimated Creatinine Clearance: 12.2 mL/min (A) (by C-G formula based on SCr of 1.64 mg/dL (H)). Liver Function Tests:  Recent Labs Lab 06/04/2017 1612 06/12/17 0314 06/13/17 0516  AST 82* 83* 76*  ALT '17 19 18  ' ALKPHOS 75 66 57  BILITOT 1.6* 1.5* 1.6*  PROT 6.2* 5.9* 5.2*  ALBUMIN 3.3* 2.9* 2.6*   No results for input(s): LIPASE, AMYLASE in the last 168 hours. No results for input(s): AMMONIA in the last 168 hours. Coagulation Profile:  Recent Labs Lab 06/12/17 0125 06/12/17 0314 06/13/17 0516  INR 3.44 3.32 2.88   Cardiac Enzymes: No results for input(s): CKTOTAL, CKMB, CKMBINDEX, TROPONINI in the last 168 hours. BNP (last 3 results) No results for input(s): PROBNP in the last 8760 hours. HbA1C: No results for input(s): HGBA1C in the last 72 hours. CBG: No results for input(s): GLUCAP in the last 168 hours. Lipid Profile: No results for input(s): CHOL, HDL, LDLCALC, TRIG, CHOLHDL, LDLDIRECT in the last 72 hours. Thyroid Function Tests: No results for input(s): TSH, T4TOTAL, FREET4, T3FREE, THYROIDAB in the last 72 hours. Anemia Panel: No results for input(s): VITAMINB12, FOLATE, FERRITIN, TIBC, IRON, RETICCTPCT in the last 72 hours. Sepsis Labs:  Recent Labs Lab 05/23/2017 1641 06/12/17 0931 06/12/17 1058  LATICACIDVEN 4.02* 1.8 1.8    Recent Results (from the past 240 hour(s))  Culture, blood (routine x 2)     Status: None (Preliminary result)   Collection Time: 05/20/2017  4:01 PM  Result Value Ref Range Status   Specimen Description BLOOD RIGHT WRIST  Final   Special Requests IN PEDIATRIC BOTTLE Blood Culture adequate volume  Final   Culture  Setup Time   Final    GRAM POSITIVE COCCI IN CLUSTERS AEROBIC BOTTLE ONLY CRITICAL VALUE NOTED.  VALUE IS CONSISTENT WITH PREVIOUSLY REPORTED AND CALLED VALUE.    Culture GRAM POSITIVE COCCI  Final   Report Status PENDING  Incomplete  Culture, blood (routine x 2)      Status: None (Preliminary result)   Collection Time: 05/24/2017  4:12 PM  Result Value Ref Range Status   Specimen Description BLOOD RIGHT ANTECUBITAL  Final   Special Requests   Final    BOTTLES DRAWN AEROBIC AND ANAEROBIC Blood Culture adequate volume   Culture  Setup Time   Final    GRAM POSITIVE COCCI IN CLUSTERS IN BOTH AEROBIC AND ANAEROBIC BOTTLES Organism ID to follow CRITICAL RESULT CALLED TO, READ BACK BY AND VERIFIED WITH: M. Radford Pax Pharm.D. 10:15 06/12/17 ([email or web address])    Culture GRAM POSITIVE COCCI  Final   Report Status PENDING  Incomplete  Blood Culture ID Panel (Reflexed)     Status: Abnormal   Collection Time: 06/04/2017  4:12 PM  Result Value Ref Range Status   Enterococcus species NOT DETECTED NOT DETECTED Final   Listeria monocytogenes NOT DETECTED NOT DETECTED Final   Staphylococcus species DETECTED (A) NOT DETECTED Final    Comment: CRITICAL RESULT CALLED TO, READ BACK BY AND VERIFIED WITH: M. Vonita Moss.D. 10:15 06/12/17 ([email or web address])    Staphylococcus aureus DETECTED (A) NOT DETECTED Final    Comment: Methicillin (oxacillin)-resistant Staphylococcus aureus (MRSA). MRSA is predictably resistant to beta-lactam antibiotics (except ceftaroline). Preferred therapy is vancomycin unless clinically contraindicated. Patient requires contact precautions if  hospitalized. CRITICAL RESULT CALLED TO, READ BACK BY AND VERIFIED WITH: M. Radford Pax Pharm.D. 10:15 06/12/17 ([email or web address])    Methicillin resistance DETECTED (A) NOT DETECTED Final    Comment: CRITICAL RESULT CALLED TO, READ BACK BY AND VERIFIED WITH: M. Radford Pax Pharm.D. 10:15 06/12/17 ([email or web address])    Streptococcus species NOT DETECTED NOT DETECTED Final   Streptococcus agalactiae NOT DETECTED NOT DETECTED Final   Streptococcus pneumoniae NOT DETECTED NOT DETECTED Final   Streptococcus pyogenes NOT DETECTED NOT DETECTED Final   Acinetobacter baumannii NOT DETECTED NOT DETECTED Final   Enterobacteriaceae species NOT DETECTED NOT DETECTED  Final   Enterobacter cloacae complex NOT DETECTED NOT DETECTED Final   Escherichia coli NOT DETECTED NOT DETECTED Final   Klebsiella oxytoca NOT DETECTED NOT DETECTED Final   Klebsiella pneumoniae NOT DETECTED NOT DETECTED Final   Proteus species NOT DETECTED NOT DETECTED Final   Serratia marcescens NOT DETECTED NOT DETECTED Final   Haemophilus influenzae NOT DETECTED NOT DETECTED Final   Neisseria meningitidis NOT DETECTED NOT DETECTED Final   Pseudomonas aeruginosa NOT DETECTED NOT DETECTED Final   Candida albicans NOT DETECTED NOT DETECTED Final   Candida glabrata NOT DETECTED NOT DETECTED Final   Candida krusei NOT DETECTED NOT DETECTED Final   Candida parapsilosis NOT DETECTED NOT DETECTED Final   Candida tropicalis NOT DETECTED NOT DETECTED Final  MRSA PCR Screening     Status: None   Collection Time: 06/12/17  4:36 AM  Result Value Ref Range Status   MRSA by PCR NEGATIVE NEGATIVE Final    Comment:        The GeneXpert MRSA Assay (FDA approved for NASAL specimens only), is one component of a comprehensive MRSA colonization surveillance program. It is not intended to diagnose MRSA infection nor to guide or monitor treatment for MRSA infections.     Radiology Studies: Dg Chest 2 View  Result Date: 05/21/2017 CLINICAL DATA:  Shortness of breath . EXAM: CHEST  2 VIEW COMPARISON:  04/18/2017. FINDINGS: Cardiac pacer with lead tips in right atrium right ventricle. Stable cardiomegaly. Interval clearing of right base infiltrate. Persistent low lung volumes and bilateral pleural effusions. No pneumothorax . IMPRESSION: 1. Interim clearing of right base infiltrate. 2. Persistent bibasilar atelectasis and small pleural effusions. 3. Cardiac pacer stable position.  Stable cardiomegaly. Electronically Signed   By: Marcello Moores  Register   On: 05/17/2017 14:58  Dg Tibia/fibula Right Port  Result Date: 06/12/2017 CLINICAL DATA:  Wound abscess. EXAM: PORTABLE RIGHT TIBIA AND FIBULA - 2 VIEW  COMPARISON:  09/30/2015 FINDINGS: The bones are diffusely osteopenic. No fracture is identified. The knee and ankle are located. No osseous erosion is seen to suggest active osteomyelitis. Diffuse vascular calcifications are noted. No soft tissue gas is seen. IMPRESSION: No evidence of active osteomyelitis or fracture. Electronically Signed   By: Logan Bores M.D.   On: 06/12/2017 20:36   Scheduled Meds: . calcium-vitamin D  2 tablet Oral Daily  . cholecalciferol  1,000 Units Oral Daily  . ferrous sulfate  325 mg Oral Q breakfast  . hydrocortisone   Rectal BID  . lidocaine  1 patch Transdermal Q24H  . mouth rinse  15 mL Mouth Rinse BID  . omega-3 acid ethyl esters  1 g Oral QPM  . vitamin C  1,000 mg Oral Daily  . vitamin E  400 Units Oral Daily  . Warfarin - Pharmacist Dosing Inpatient   Does not apply q1800   Continuous Infusions: . [START ON 06/14/2017] vancomycin      LOS: 2 days   Kerney Elbe, DO [facility] Pager [phone]  If 7PM-7AM, please contact night-coverage [email or web address] Password [facility] 06/13/2017, 7:29 AM

## 2017-06-14 DIAGNOSIS — Z7189 Other specified counseling: Secondary | ICD-10-CM

## 2017-06-14 DIAGNOSIS — Z515 Encounter for palliative care: Secondary | ICD-10-CM

## 2017-06-14 LAB — CBC WITH DIFFERENTIAL/PLATELET
Basophils Absolute: 0 10*3/uL (ref 0.0–0.1)
Basophils Relative: 0 %
Eosinophils Absolute: 0.2 10*3/uL (ref 0.0–0.7)
Eosinophils Relative: 1 %
HEMATOCRIT: 36.7 % (ref 36.0–46.0)
HEMOGLOBIN: 12.3 g/dL (ref 12.0–15.0)
LYMPHS ABS: 0.5 10*3/uL — AB (ref 0.7–4.0)
LYMPHS PCT: 4 %
MCH: 35.2 pg — AB (ref 26.0–34.0)
MCHC: 33.5 g/dL (ref 30.0–36.0)
MCV: 105.2 fL — AB (ref 78.0–100.0)
MONOS PCT: 6 %
Monocytes Absolute: 0.6 10*3/uL (ref 0.1–1.0)
NEUTROS PCT: 88 %
Neutro Abs: 9.3 10*3/uL — ABNORMAL HIGH (ref 1.7–7.7)
Platelets: 54 10*3/uL — ABNORMAL LOW (ref 150–400)
RBC: 3.49 MIL/uL — AB (ref 3.87–5.11)
RDW: 17.7 % — ABNORMAL HIGH (ref 11.5–15.5)
WBC: 10.6 10*3/uL — AB (ref 4.0–10.5)

## 2017-06-14 LAB — CULTURE, BLOOD (ROUTINE X 2)
Special Requests: ADEQUATE
Special Requests: ADEQUATE

## 2017-06-14 LAB — COMPREHENSIVE METABOLIC PANEL
ALK PHOS: 56 U/L (ref 38–126)
ALT: 21 U/L (ref 14–54)
AST: 104 U/L — ABNORMAL HIGH (ref 15–41)
Albumin: 2.6 g/dL — ABNORMAL LOW (ref 3.5–5.0)
Anion gap: 14 (ref 5–15)
BILIRUBIN TOTAL: 2.5 mg/dL — AB (ref 0.3–1.2)
BUN: 61 mg/dL — ABNORMAL HIGH (ref 6–20)
CALCIUM: 7.7 mg/dL — AB (ref 8.9–10.3)
CO2: 14 mmol/L — ABNORMAL LOW (ref 22–32)
Chloride: 107 mmol/L (ref 101–111)
Creatinine, Ser: 2.08 mg/dL — ABNORMAL HIGH (ref 0.44–1.00)
GFR, EST AFRICAN AMERICAN: 22 mL/min — AB (ref >60)
GFR, EST NON AFRICAN AMERICAN: 19 mL/min — AB (ref >60)
Glucose, Bld: 76 mg/dL (ref 65–99)
Potassium: 6.1 mmol/L — ABNORMAL HIGH (ref 3.5–5.1)
Sodium: 135 mmol/L (ref 135–145)
TOTAL PROTEIN: 5 g/dL — AB (ref 6.5–8.1)

## 2017-06-14 LAB — URINE CULTURE: Culture: >=100000 — AB

## 2017-06-14 LAB — PROTIME-INR
INR: 2.69
Prothrombin Time: 29.1 seconds — ABNORMAL HIGH (ref 11.4–15.2)

## 2017-06-14 LAB — PHOSPHORUS: PHOSPHORUS: 4.3 mg/dL (ref 2.5–4.6)

## 2017-06-14 LAB — MAGNESIUM: MAGNESIUM: 1.9 mg/dL (ref 1.7–2.4)

## 2017-06-14 MED ORDER — GLYCOPYRROLATE 1 MG PO TABS
1.0000 mg | ORAL_TABLET | ORAL | Status: DC | PRN
  Filled 2017-06-14: qty 1

## 2017-06-14 MED ORDER — LORAZEPAM 2 MG/ML PO CONC
1.0000 mg | ORAL | Status: DC | PRN
  Filled 2017-06-14: qty 1

## 2017-06-14 MED ORDER — LORAZEPAM 1 MG PO TABS: 1.0000 mg | ORAL_TABLET | ORAL | Status: DC | PRN

## 2017-06-14 MED ORDER — HALOPERIDOL LACTATE 2 MG/ML PO CONC
0.5000 mg | ORAL | Status: DC | PRN
  Filled 2017-06-14: qty 0.3

## 2017-06-14 MED ORDER — LOPERAMIDE HCL 1 MG/5ML PO LIQD
4.0000 mg | ORAL | Status: AC
  Administered 2017-06-15: 4 mg via ORAL
  Filled 2017-06-14: qty 20

## 2017-06-14 MED ORDER — HALOPERIDOL 1 MG PO TABS
0.5000 mg | ORAL_TABLET | ORAL | Status: DC | PRN
  Filled 2017-06-14: qty 1

## 2017-06-14 MED ORDER — SODIUM POLYSTYRENE SULFONATE 15 GM/60ML PO SUSP
30.0000 g | Freq: Once | ORAL | Status: AC
  Administered 2017-06-14: 30 g via ORAL
  Filled 2017-06-14: qty 120

## 2017-06-14 MED ORDER — GLYCOPYRROLATE 0.2 MG/ML IJ SOLN: 0.2000 mg | INTRAMUSCULAR | Status: DC | PRN

## 2017-06-14 MED ORDER — CEFTAROLINE FOSAMIL 400 MG IV SOLR: 400.0000 mg | Freq: Two times a day (BID) | INTRAVENOUS | Status: DC

## 2017-06-14 MED ORDER — SODIUM CHLORIDE 0.9 % IV SOLN: INTRAVENOUS | Status: DC

## 2017-06-14 MED ORDER — CEFTAROLINE FOSAMIL 400 MG IV SOLR
200.0000 mg | Freq: Two times a day (BID) | INTRAVENOUS | Status: DC
  Administered 2017-06-14: 200 mg via INTRAVENOUS
  Filled 2017-06-14 (×2): qty 200

## 2017-06-14 MED ORDER — MORPHINE SULFATE (CONCENTRATE) 10 MG/0.5ML PO SOLN
5.0000 mg | ORAL | Status: DC | PRN
  Administered 2017-06-14: 5 mg via ORAL
  Filled 2017-06-14: qty 0.5

## 2017-06-14 MED ORDER — LOPERAMIDE HCL 1 MG/5ML PO LIQD
2.0000 mg | Freq: Three times a day (TID) | ORAL | Status: DC | PRN
Start: 2017-06-14 — End: 2017-06-17
  Filled 2017-06-14: qty 10

## 2017-06-14 NOTE — Care Management Note (Signed)
Case Management Note  Patient Details  Name: [name] MRN: [id number] Date of Birth: Aug 22, 1919  Subjective/Objective:  Pt admitted with weakness and SOB, MSSA Bacteremia and possible vegetation with pacemaker infection (pacemaker will not be removed per cardiology)                Action/Plan:  PTA from home with daughter (children are caregivers and POAs).  Palliative care is involved - pt has been switched to DNR but family would like to remain aggressive with treatments.  HHPT recommended - CM requested attending to write order and face to face so home health can be arranged.     Expected Discharge Date:  06/13/17               Expected Discharge Plan:  [name]  In-House Referral:     Discharge planning Services  CM Consult  Post Acute Care Choice:    Choice offered to:     DME Arranged:    DME Agency:     HH Arranged:    HH Agency:     Status of Service:     If discussed at [employer], dates discussed:    Additional Comments: 06/14/2017  INR has trended down to 2.69 with goal of 1.8 -2.4.   Coumadin being held.  Pt continues to require a non re breather and Palliative continues to follow pt   Maryclare Labrador, RN 06/14/2017, 9:35 AM

## 2017-06-14 NOTE — Progress Notes (Signed)
Daily Progress Note   Patient Name: [name]       Date: 06/14/2017 DOB: 08-29-1919  Age: 83 y.o. MRN#: [id number] Attending Physician: Kerney Elbe, DO Primary Care Physician: Golden Circle, FNP Admit Date: 05/17/2017  Reason for Consultation/Follow-up: Terminal Care and Withdrawal of life-sustaining treatment  Subjective: Patient in bed moaning, crying. Family tells me IV site has been lost.  We further discussed GOC as previously discussed with Wadie Lessen, NP.  Decision made not to proceed with further attempts at IV access. Family wishes to transition care to full comfort measures only. Clarified with family that full comfort measures only would be to discontinue antibiotics and other interventions that aim to prolong life. Medications and interventions would be provided to ensure comfort and support through dying process. Family verbalized understanding and note they have discussed this among themselves and with the patient and this is their [place].  Patient's main complaints are of lower extremity pain that eases when her feet are lowered. She wants to sit up. Family notes she seems to feel better when she is allowed to sit up. She is also having frequent diarrhea. She is complaining of pain all over.   ROS  Length of Stay: 3  Current Medications: Scheduled Meds:  . hydrocortisone   Rectal BID  . lidocaine  1 patch Transdermal Q24H  . loperamide  4 mg Oral NOW  . mouth rinse  15 mL Mouth Rinse BID    Continuous Infusions:   PRN Meds: acetaminophen, acetaminophen, glycopyrrolate **OR** glycopyrrolate, haloperidol **OR** haloperidol, loperamide, LORazepam **OR** LORazepam, morphine CONCENTRATE  Physical Exam  Constitutional:  cachetic  Cardiovascular:  Hands  purple, cool to touch  Neurological: She is alert.  HOH, unable to assess orientation, moaning in pain  Nursing note and vitals reviewed.           Vital Signs: BP (!) 89/47 (BP Location: Right Arm)   Pulse 75   Temp (!) 97.4 F (36.3 C) (Oral)   Resp 19   Ht 4\' 9"  (1.448 m)   Wt 42 kg (92 lb 9.5 oz)   LMP  (LMP Unknown)   SpO2 93%   BMI 20.04 kg/m  SpO2: SpO2: 93 % O2 Device: O2 Device: Nasal Cannula O2 Flow Rate: O2 Flow Rate (L/min): 5 L/min  Intake/output  summary:  Intake/Output Summary (Last 24 hours) at 06/14/17 1652 Last data filed at 06/14/17 1133  Gross per 24 hour  Intake              615 ml  Output               10 ml  Net              605 ml   LBM: Last BM Date: 06/13/17 Baseline Weight: Weight: 40.8 kg (90 lb) Most recent weight: Weight: 42 kg (92 lb 9.5 oz)       Palliative Assessment/Data: PPS: 20%    Flowsheet Rows     Most Recent Value  Intake Tab  Referral Department  Hospitalist  Unit at Time of Referral  Intermediate Care Unit  Palliative Care Primary Diagnosis  Sepsis/Infectious Disease  Date Notified  06/12/17  Palliative Care Type  New Palliative care  Reason for referral  Clarify Goals of Care  Date of Admission  06/05/2017  Date first seen by Palliative Care  06/13/17  # of days Palliative referral response time  1 Day(s)  # of days IP prior to Palliative referral  1  Clinical Assessment  Palliative Performance Scale Score  20%  Psychosocial & Spiritual Assessment  Palliative Care Outcomes      Patient Active Problem List   Diagnosis Date Noted  . DNR (do not resuscitate)   . Palliative care by specialist   . Altered mental status   . Wound abscess   . Endocarditis of native valve   . Pacemaker infection ([place]) 06/12/2017  . MRSA bacteremia 06/12/2017  . Pyuria 06/12/2017  . AKI (acute kidney injury) ([place]) 06/12/2017  . Thrombocytopenia ([place]) 06/12/2017  . Acute diastolic CHF (congestive heart failure) ([place])   . Hypoxia  06/09/2017  . Acute hypoxemic respiratory failure ([place]) 06/06/2017  . Decreased hearing 04/24/2017  . Bacteriuria with pyuria 04/24/2017  . Community acquired pneumonia 04/24/2017  . Fall 04/24/2017  . Pill dysphagia 08/15/2016  . Globus sensation 08/01/2016  . Difficulty swallowing pills 05/30/2016  . Bilateral shoulder pain 05/30/2016  . Dizziness 05/30/2016  . Rib pain on left side 11/03/2015  . Wound of skin 10/07/2015  . Hx of completed stroke 10/05/2015  . Traumatic hematoma 09/30/2015  . Permanent atrial fibrillation ([place]) 09/30/2015  . Leg hematoma 09/30/2015  . Cough   . Extrarenal azotemia 12/28/2014  . End of life care 01/29/2014  . Hyponatremia 10/25/2013  . Fracture of multiple pubic rami ([place]) 10/23/2013  . GERD (gastroesophageal reflux disease) 12/29/2012  . Long term current use of anticoagulant 10/29/2011  . Stroke ([place]) 10/23/2011  . Pacemaker  single-chamber-St. Jude's 04/17/2011  . DIASTOLIC HEART FAILURE, CHRONIC 01/08/2011  . Atrial fibrillation ([place]) 12/26/2010  . ANEMIA 12/14/2010  . CAROTID ARTERY DISEASE 07/27/2010  . Chronic pain syndrome 01/23/2010  . Coronary atherosclerosis 01/15/2009  . ATRIAL SEPTAL DEFECT 01/15/2009  . AV BLOCK, COMPLETE 11/03/2008  . DIVERTICULOSIS OF COLON 07/17/2008  . OVERACTIVE BLADDER 07/17/2008  . DEGENERATIVE JOINT DISEASE 07/15/2008  . Osteoporosis 07/15/2008  . HYPERCHOLESTEROLEMIA 09/04/2007  . VENOUS INSUFFICIENCY 09/04/2007  . RESPIRATORY DISORDER, CHRONIC 09/04/2007  . BACK PAIN, LUMBAR 09/04/2007    Palliative Care Assessment & Plan   Patient Profile: 83 y.o.femalewith history of HTN, CAD, h/o ASD repaired, permanent AF s/p AVN ablation (x2) w/PPM, on chronic coumadin, h/o diastolic chf, h/ovenous insufficiency , chronic leg wound.She was admitted on 05/28/2017 with complaints of sob, weakness and  confusion. Workup revealed MRSA bacteremia- presumed from infected pacemaker. Patient has continued to  decompensate during this admission. IV access has become difficult and family has made decision to transition to comfort measures.   Assessment/Recommendations/Plan  Transition to full comfort measures 5mg  morphine concentrate SL q1hr prn pain, SOB, air hunger 4mg  immodium solution for diarrhea now, then 2mg  TID prn Lorazepam 1mg  po, SL q4 hr anxiety or SOB Haldol .5mg  po, SL q4 hr PRN agitation Glycopyrrolate .2mg  SL q4hr secretions D/C all medications and interventions that are not provided with intention of providing comfort   Goals of Care and Additional Recommendations:  Limitations on Scope of Treatment: Full Comfort Care  Code Status:  DNR  Prognosis:   Hours - Days due to MRSA bacteremia, AKI, now transitioning to full comfort measures.    Discharge Planning:  Anticipated Hospital Death  Care plan was discussed with patient's daughter and son, patient's RN, and Dr. Alfredia Ferguson.  Thank you for allowing the Palliative Medicine Team to assist in the care of this patient.   Total Time: 60 minutes  Greater than 50%  of this time was spent counseling and coordinating care related to the above assessment and plan.  Mariana Kaufman, AGNP-C Palliative Medicine   Please contact Palliative Medicine Team phone at [phone] for questions and concerns.

## 2017-06-14 NOTE — Consult Note (Signed)
   [facility] Inpatient Consult   06/14/2017  SCHUYLER BEHAN [birth year] [phone]  Patient screened for potential [place] Management services. Patient is eligible for [facility] Care Management services under patient's Medicare  plan. Chart reviewed and saw Palliative Care notes. Will follow for progress and disposition needs as appropriate.  Please place a [facility] Care Management consult or for questions contact:   Natividad Brood, RN BSN [facility] Liaison  [phone] business mobile phone Toll free office [phone]

## 2017-06-14 NOTE — Progress Notes (Signed)
ANTICOAGULATION CONSULT NOTE - Follow Up Consult  Pharmacy Consult for Warfarin  Indication: atrial fibrillation  Patient Measurements: Height: 4\' 9"  (144.8 cm) Weight: 92 lb 9.5 oz (42 kg) IBW/kg (Calculated) : 38.6  Vital Signs: Temp: 99.4 F (37.4 C) (08/03 0800) Temp Source: Axillary (08/03 0800) BP: 78/41 (08/03 0600) Pulse Rate: 74 (08/03 0600)  Labs:  Recent Labs  06/12/17 0314 06/13/17 0516 06/14/17 0644  HGB 13.4 12.5 12.3  HCT 40.4 36.8 36.7  PLT 50* 55* 54*  LABPROT 34.5* 30.8* 29.1*  INR 3.32 2.88 2.69  CREATININE 1.36* 1.64* 2.08*    Estimated Creatinine Clearance: 9.6 mL/min (A) (by C-G formula based on SCr of 2.08 mg/dL (H)).   Assessment: 83 y/o F here with shortness of breath and confusion, continuing warfarin PTA for afib, INR has trended down to 2.69. Given only small decline in INR overnight, general patient decline, and cardiology recommendations to stop anticoagulation will hold coumadin again tonight. Will continue to follow progress and redose as appropriate.    Platelet count stable at 54.  (lower goal of 1.8-2.4 per outpatient anti-coag notes).  Goal of Therapy:  INR 1.8-2.4 (per outpatient anti-coag notes) Monitor platelets by anticoagulation protocol: Yes   Plan:  -No warfarin again tonight  -Agree with stopping anticoagulation at this time given decline and plt count -Continue to follow along  Erin Hearing PharmD., BCPS Clinical Pharmacist Pager [phone] 06/14/2017 8:56 AM

## 2017-06-14 NOTE — Progress Notes (Signed)
      INFECTIOUS DISEASE ATTENDING ADDENDUM:   Date: 06/14/2017  Patient name: [name]  Medical record number: [id number]  Date of birth: 1919-09-26    This patient has been seen and discussed with the house staff. Please see the resident's note for complete details. I concur with their findings with the following additions/corrections:  I came and examined the pt early this and then again at 2pm.   This am she looked to be doing very poorly with cool extremities, low BP. She was on full FM. Daughter was crying that pt was suffering from skin tear with blood draw. Cr steadily worse  I changed pt to Teflaro but also informed daughter that I did not think pt was doing well and that I doubted she would survive this hospitalization. When I came back pt had been made comfort care and family were saying their good byes  I spent greater than 35 minutes with the patient including greater than 50% of time in face to face counsel of the patient and family re PM infection, endocarditis, renal failure, sepsis and goals of care .   Rhina Brackett [place] 06/14/2017, 2:07 PM

## 2017-06-14 NOTE — Progress Notes (Signed)
PROGRESS NOTE    [name]  WIO:[id number] DOB: 27-Sep-1919 DOA: 06/09/2017 PCP: [facility], FNP  Brief Narrative:  [name] is a 83 y.o. female with history of HTN, CAD,  h/o ASD repair, permanent AF s/p AVN ablation (x2) w/PPM, on chronic coumadin, h/o Diastolic CHF, h/o venous insufficiency, chronic leg wound presented to the ED due to above complaints. Daughter reports patient was recently treated for community acquired pneumonia, then got fluids overload. Her cardiologist recently increased her lasix to 70m daily a month ago. Per chart review, she was just seen by cardiology on 7/24 with "OK"volume status and renal function.Daughter report patient weight still up about 1.5lbs (84.5lbs) from target weight (83lbs) She is brought to the ED due to sob, weakness and confusion. She was found to have a MRSA bacteremia and ID, Cardiology, Electrophysiology, and Palliative Care Medicine were consulted for further evaluation and recommendations. Palliative Care met with the family and patient's code status was changed to DNR. Her Cr increased so she was given a small Fluid Bolus. Cardiology signed off as they have nothing else to offer and ID may consider changing Abx given Cr Worsening. ID changed Abx to Teflaro as patient's Cr continued to Worsen. Patient had multiple skin tears overnight from attempted blood sticks and removed her IV Access. After discussion with Palliative Care Medicine, family elected to pursue full comfort measures so all aggressive measures not benefiting the patient's comfort were discontinued.  Prognosis is extremely poor and likely in hospital death is anticipated.   Assessment & Plan:   Principal Problem:   Pacemaker infection ([facility] Active Problems:   HYPERCHOLESTEROLEMIA   Chronic pain syndrome   Coronary atherosclerosis   BACK PAIN, LUMBAR   Atrial fibrillation (HCC)   Permanent atrial fibrillation (HCC)   Bacteriuria with pyuria   Hypoxia   Acute  hypoxemic respiratory failure (HCC)   Acute diastolic CHF (congestive heart failure) (HCC)   MRSA bacteremia   Pyuria   AKI (acute kidney injury) ([place]   Thrombocytopenia (HCC)   DNR (do not resuscitate)   Palliative care by specialist   Altered mental status   Wound abscess   Endocarditis of native valve  Confusion, likely Metabolic encephalopathy from Infectious Etiology -Family report patient is more confused than her baseline, hard to get any history from patient -Continue to Monitor -SLP ordered and recommending resuming a regular consistency diet and thin liquids and continuing cruhsing meds with puree -Likely from Bacteremia and Infection of Pacer  -Patient made Comfort Measures so all aggressive measures not benefiting the patient's comfort were discontinued.    MRSA Bacteremia -Patient has 2/2 Blood Cx positive for MRSA; Repeat Cx are Negative  -Pacemaker is likely also infected. Electrophysiologist was consulted and they recommend against extraction and recommending palliative Abx Strategy -Transthoracic ECHO was done and showed Normal LV size with EF 55-60%. Moderate diastolic dysfunction. Moderately dilated RV with moderately decreased systolic function. D-shaped interventricular septum suggestive of RV  pressure/volume overload. Mild aortic stenosis. Heavily calcified  mitral valve and annulus with moderate to severe mitral regurgitation. The Cardiologist reading the ECHO was concerned that there may be a small  vegetation at the tip of the posterior leaflet. Moderate pulmonary hypertension. Severe biatrial enlargement. Moderate TR. There is a mobile mass noted flickering in and out of view in one  image of the right atrium. Possible vegetation on pacemaker lead. -Will not Pursue TEE -IV Vancomycin was changed to Teflaro by ID; Now discontinued as patient  is Full Comfort Measures. -WBC went from 10.1 -> 11.7 -> 9.7 -> 10.6 -Palliative Care Consulted for further Goals of Care  and Code Status changed to DNR. After discussion with the Palliative Care Team patient is now Full Comfort Measures and has Robinul for secretions, Haldol for Agitation/Delirium, Lidocaine Patch and po Morphine for Pain and Lorazepam for Anxiety.   Acute hypoxic respiratory failure Suspect from acute on chronic diastolic chf exacerbation and possible ?PNA  -She received iv lasix 9m in the ED x1,  No further Diruesis  -[facility]Cardiology consulted and recommending holding diuretics and obtaining a Transthoracic ECHO -Hypoxia worsening and after Palliative Discussion with Family no escalation of Care -Patient is changed to DNR/DNI -Patient was placed on 80% Face tent but did not tolerate so is back on NRB and saturating 86% -Prognosis is extremely poor and [facility] Com[facility] Me[facility]  Pyuria with Bacteruria -U/A with many bacteria, wbc TNTC, RBC TNTC, denies back pain, denies dysuria ( per daughter) -Urine culture pending -Rocephin Discontinued by Infectious Diseases as they do not believe she has a UTI -Patient now Full Comfort Measures  AKI, worsening  -Likley from Heart Failure and Vancomycin -BUN/Cr went from 24/1.32 -> 24/1.32 -> 47/1.64 -> 61/2.08 -IV Vancomycin changed to Teflaro but now D/C'd now that patient is Full Comfort.  -Will not Repeat CMP in AM now that patient is Comfort Measures  Thrombocytopenia:  -Acute on chronic, noted platelet started to trend down since 03/2017 -From liver congestion?  From acute infection? Less likely MAHA, no mention of schistocytes, no recent heparin exposure ( has been on coumadin),  -will check fractionated bili -Monitor, consider stop coumadin if plt less than 50. Platelet Count was 54 this AM  -No acute bleeding -Will not Repeat CBC in AM now that patient is Full Comfort  Mild elevation of LFT, possibly from liver congestion -AST went from 83 -> 76 -> 104 -ALT went from 19 ->  18 -> 21 -Will not Repeat CMP in AM now that Patient is Full Comfort  Hyperkalemia -K+ went from 5.2 -> 4.2 -> 6.1 -Given Kayexalate this AM  -Will not Continue to Monitor and Repeat CMP in AM now that patient is Comfort   Chronic Afib, s/p pacemaker,  -Purrently paced rhythm -not on rate control agent at home but on Anticoagulation with Coumadin  -Pharmacy to dose coumadin -PT-INR was 30.8-2.88 -Will not Repeat PT INR in AM now that patient is Comfort Measures   Chronic leg wounds -Suspect possibly the cause of the bacteremia -Wound care consulted and appreciated Recc's -Patient is now Comfort  FTT -Patient is now Comfort Measures and likely will not survive hospitalization   Mildly Elevated Troponin -Likely demand ischemia in the setting of Atrial Fibrillation and Infection -Cardiology Consulted for further evaluation and Recommendations; Cardiology has nothing else to offer -Patient is now Comfort Measures  DVT prophylaxis: None now that patient is Comfort Measures Code Status: DO NOT RESUSCITATE Family Communication: Discussed with Daughter at bedside Disposition Plan: High Risk of Decompensation; Anticipated In Hospital Death   Consultants:   Infectious Diseases  Cardiology  Electrophysiology  Palliative Care Medicine   Procedures:  ECHOCARDIOGRAM Study Conclusions  - Left ventricle: The cavity size was normal. Wall thickness was   normal. Systolic function was normal. The estimated ejection   fraction was in the range of 55% to 60%. Wall motion was normal;   there were no regional wall motion  abnormalities. Features are   consistent with a pseudonormal left ventricular filling pattern,   with concomitant abnormal relaxation and increased filling   pressure (grade 2 diastolic dysfunction). - Ventricular septum: D-shaped interventricular septum suggestive   of RV pressure/volume overload. - Aortic valve: Trileaflet; moderately calcified leaflets.  There   was mild stenosis. There was trivial regurgitation. Mean gradient   (S): 9 mm Hg. - Mitral valve: Severely calcified annulus. Moderately calcified   leaflets. There is a possible vegetation (small) noted near the   tip of the posterior leaflet. There was moderate to severe   regurgitation. - Left atrium: The atrium was severely dilated. - Right ventricle: The cavity size was moderately dilated. Pacer   wire or catheter noted in right ventricle. Systolic function was   moderately reduced. - Right atrium: The atrium was severely dilated. In one view (image   51), a small mobile mass occasionally flickers into view in the   right atrium, ?vegetation on pacemaker lead. - Tricuspid valve: There was moderate regurgitation. Peak RV-RA   gradient (S): 51 mm Hg. - Pulmonary arteries: PA peak pressure: 59 mm Hg (S). - Systemic veins: IVC measured 2.1 cm with < 50% respirophasic   variation, suggesting RA pressure 8 mmHg. - Pericardium, extracardiac: A trivial pericardial effusion was   identified.  Impressions:  - Normal LV size with EF 55-60%. Moderate diastolic dysfunction.   Moderately dilated RV with moderately decreased systolic   function. D-shaped interventricular septum suggestive of RV   pressure/volume overload. Mild aortic stenosis. Heavily calcified   mitral valve and annulus with moderate to severe mitral   regurgitation. I am concerned that there may be a small   vegetation at the tip of the posterior leaflet. Moderate   pulmonary hypertension. Severe biatrial enlargement. Moderate TR.   There is a mobile mass noted flickering in and out of view in one   image of the right atrium. Possible vegetation on pacemaker lead.   This patient needs a TEE.   Antimicrobials: Anti-infectives    Start     Dose/Rate Route Frequency Ordered Stop   06/14/17 1000  vancomycin (VANCOCIN) 500 mg in sodium chloride 0.9 % 100 mL IVPB  Status:  Discontinued     500 mg 100 mL/hr over  60 Minutes Intravenous Every 48 hours 06/12/17 0940 06/14/17 0845   06/14/17 1000  ceftaroline (TEFLARO) injection SOLR 400 mg  Status:  Discontinued     400 mg Intravenous Every 12 hours 06/14/17 0845 06/14/17 0849   06/14/17 1000  ceftaroline (TEFLARO) 200 mg in sodium chloride 0.9 % 250 mL IVPB  Status:  Discontinued     200 mg 250 mL/hr over 60 Minutes Intravenous Every 12 hours 06/14/17 0849 06/14/17 1643   06/12/17 1000  vancomycin (VANCOCIN) IVPB 750 mg/150 ml premix     750 mg 150 mL/hr over 60 Minutes Intravenous  Once 06/12/17 0940 06/12/17 1100   06/09/2017 2145  cefTRIAXone (ROCEPHIN) 1 g in dextrose 5 % 50 mL IVPB  Status:  Discontinued     1 g 100 mL/hr over 30 Minutes Intravenous Every 24 hours 05/29/2017 2136 06/12/17 1039   05/24/2017 1545  cefTRIAXone (ROCEPHIN) 1 g in dextrose 5 % 50 mL IVPB     1 g 100 mL/hr over 30 Minutes Intravenous  Once 06/03/2017 1543 05/26/2017 1718     Subjective: Patient was seen and examined at bedside and had multiple skin tears from attempted blood work. Patient was not very interactive  today and less responsive. Daughter tearful as she understands mother is unlikely to survive hospitalization.  Objective: Vitals:   06/14/17 0800 06/14/17 1150 06/14/17 1200 06/14/17 1530  BP:   (!) 89/47   Pulse:   74 75  Resp:   17 19  Temp: 99.4 F (37.4 C) 98.1 F (36.7 C)  (!) 97.4 F (36.3 C)  TempSrc: Axillary Axillary  Oral  SpO2:   98% 93%  Weight:      Height:        Intake/Output Summary (Last 24 hours) at 06/14/17 1649 Last data filed at 06/14/17 1133  Gross per 24 hour  Intake              615 ml  Output               10 ml  Net              605 ml   Filed Weights   06/06/2017 1331 06/14/17 0600  Weight: 40.8 kg (90 lb) 42 kg (92 lb 9.5 oz)   Examination: Physical Exam:  Constitutional: Thin cachectic elderly Caucasian female who is in mild respiratory distress; Not very communicative and wearing NRB Eyes: Sclerae anicteric; Lids  Normal ENMT: Hard of Hearing. Mucous Membranes on the drier side; Poor dentition  Neck: Supple with no JVD Respiratory: Diminished with coarse breath sounds. Patient was slightly tachypenic and wearing NRB. Cardiovascular: Irregularly Irregular. No appreciable edema  Abdomen: Soft, NT, ND. Bowel Sounds present GU: Deferred Musculoskeletal: No contractures but has extremity cyanosis Skin: Had cool extremities and some cyanosis. Had multiple skin tears noted on arms; Bilateral Lower etremities wrapped Neurologic: Limited due to current condition. Patient extremely hard of hearing.  Psychiatric: Impaired Judgement and Insight. Awake but not oriented  Data Reviewed: I have personally reviewed following labs and imaging studies  CBC:  Recent Labs Lab 05/14/2017 1612 06/12/17 0314 06/13/17 0516 06/14/17 0644  WBC 10.1 11.7* 9.7 10.6*  NEUTROABS 9.7*  --  8.7* 9.3*  HGB 13.2 13.4 12.5 12.3  HCT 40.2 40.4 36.8 36.7  MCV 109.5* 108.9* 106.1* 105.2*  PLT 59* 50* 55* 54*   Basic Metabolic Panel:  Recent Labs Lab 05/22/2017 1612 06/12/17 0314 06/13/17 0516 06/14/17 0644  NA 139 137 137 135  K 5.2* 5.0 4.2 6.1*  CL 104 105 106 107  CO2 23 21* 23 14*  GLUCOSE 89 68 86 76  BUN 24* 28* 47* 61*  CREATININE 1.32* 1.36* 1.64* 2.08*  CALCIUM 9.1 8.4* 7.8* 7.7*  MG  --   --  2.0 1.9  PHOS  --   --  4.2 4.3   GFR: Estimated Creatinine Clearance: 9.6 mL/min (A) (by C-G formula based on SCr of 2.08 mg/dL (H)). Liver Function Tests:  Recent Labs Lab 05/26/2017 1612 06/12/17 0314 06/13/17 0516 06/14/17 0644  AST 82* 83* 76* 104*  ALT '17 19 18 21  ' ALKPHOS 75 66 57 56  BILITOT 1.6* 1.5* 1.6* 2.5*  PROT 6.2* 5.9* 5.2* 5.0*  ALBUMIN 3.3* 2.9* 2.6* 2.6*   No results for input(s): LIPASE, AMYLASE in the last 168 hours. No results for input(s): AMMONIA in the last 168 hours. Coagulation Profile:  Recent Labs Lab 06/12/17 0125 06/12/17 0314 06/13/17 0516 06/14/17 0644  INR 3.44  3.32 2.88 2.69   Cardiac Enzymes: No results for input(s): CKTOTAL, CKMB, CKMBINDEX, TROPONINI in the last 168 hours. BNP (last 3 results) No results for input(s): PROBNP in the last 8760 hours.  HbA1C: No results for input(s): HGBA1C in the last 72 hours. CBG: No results for input(s): GLUCAP in the last 168 hours. Lipid Profile: No results for input(s): CHOL, HDL, LDLCALC, TRIG, CHOLHDL, LDLDIRECT in the last 72 hours. Thyroid Function Tests: No results for input(s): TSH, T4TOTAL, FREET4, T3FREE, THYROIDAB in the last 72 hours. Anemia Panel: No results for input(s): VITAMINB12, FOLATE, FERRITIN, TIBC, IRON, RETICCTPCT in the last 72 hours. Sepsis Labs:  Recent Labs Lab 05/25/2017 1641 06/12/17 0931 06/12/17 1058  LATICACIDVEN 4.02* 1.8 1.8    Recent Results (from the past 240 hour(s))  Urine culture     Status: Abnormal   Collection Time: 05/24/2017  2:37 PM  Result Value Ref Range Status   Specimen Description URINE, RANDOM  Final   Special Requests NONE  Final   Culture >=100,000 COLONIES/mL ESCHERICHIA COLI (A)  Final   Report Status 06/14/2017 FINAL  Final   Organism ID, Bacteria ESCHERICHIA COLI (A)  Final      Susceptibility   Escherichia coli - MIC*    AMPICILLIN <=2 SENSITIVE Sensitive     CEFAZOLIN <=4 SENSITIVE Sensitive     CEFTRIAXONE <=1 SENSITIVE Sensitive     CIPROFLOXACIN <=0.25 SENSITIVE Sensitive     GENTAMICIN <=1 SENSITIVE Sensitive     IMIPENEM <=0.25 SENSITIVE Sensitive     NITROFURANTOIN <=16 SENSITIVE Sensitive     TRIMETH/SULFA <=20 SENSITIVE Sensitive     AMPICILLIN/SULBACTAM <=2 SENSITIVE Sensitive     PIP/TAZO <=4 SENSITIVE Sensitive     Extended ESBL NEGATIVE Sensitive     * >=100,000 COLONIES/mL ESCHERICHIA COLI  Culture, blood (routine x 2)     Status: Abnormal   Collection Time: 06/09/2017  4:01 PM  Result Value Ref Range Status   Specimen Description BLOOD RIGHT WRIST  Final   Special Requests IN PEDIATRIC BOTTLE Blood Culture adequate  volume  Final   Culture  Setup Time   Final    GRAM POSITIVE COCCI IN CLUSTERS AEROBIC BOTTLE ONLY CRITICAL VALUE NOTED.  VALUE IS CONSISTENT WITH PREVIOUSLY REPORTED AND CALLED VALUE.    Culture (A)  Final    STAPHYLOCOCCUS AUREUS SUSCEPTIBILITIES PERFORMED ON PREVIOUS CULTURE WITHIN THE LAST 5 DAYS.    Report Status 06/14/2017 FINAL  Final  Culture, blood (routine x 2)     Status: Abnormal   Collection Time: 05/30/2017  4:12 PM  Result Value Ref Range Status   Specimen Description BLOOD RIGHT ANTECUBITAL  Final   Special Requests   Final    BOTTLES DRAWN AEROBIC AND ANAEROBIC Blood Culture adequate volume   Culture  Setup Time   Final    GRAM POSITIVE COCCI IN CLUSTERS IN BOTH AEROBIC AND ANAEROBIC BOTTLES CRITICAL RESULT CALLED TO, READ BACK BY AND VERIFIED WITH: Leonie Green Pharm.D. 10:15 06/12/17 ([email or web address])    Culture METHICILLIN RESISTANT STAPHYLOCOCCUS AUREUS (A)  Final   Report Status 06/14/2017 FINAL  Final   Organism ID, Bacteria METHICILLIN RESISTANT STAPHYLOCOCCUS AUREUS  Final      Susceptibility   Methicillin resistant staphylococcus aureus - MIC*    CIPROFLOXACIN >=8 RESISTANT Resistant     ERYTHROMYCIN >=8 RESISTANT Resistant     GENTAMICIN <=0.5 SENSITIVE Sensitive     OXACILLIN >=4 RESISTANT Resistant     TETRACYCLINE <=1 SENSITIVE Sensitive     VANCOMYCIN 1 SENSITIVE Sensitive     TRIMETH/SULFA >=320 RESISTANT Resistant     CLINDAMYCIN <=0.25 SENSITIVE Sensitive     RIFAMPIN <=0.5 SENSITIVE Sensitive     Inducible  Clindamycin NEGATIVE Sensitive     * METHICILLIN RESISTANT STAPHYLOCOCCUS AUREUS  Blood Culture ID Panel (Reflexed)     Status: Abnormal   Collection Time: 06/03/2017  4:12 PM  Result Value Ref Range Status   Enterococcus species NOT DETECTED NOT DETECTED Final   Listeria monocytogenes NOT DETECTED NOT DETECTED Final   Staphylococcus species DETECTED (A) NOT DETECTED Final    Comment: CRITICAL RESULT CALLED TO, READ BACK BY AND VERIFIED WITH: M. Vonita Moss.D. 10:15 06/12/17 ([email or web address])    Staphylococcus aureus DETECTED (A) NOT DETECTED Final    Comment: Methicillin (oxacillin)-resistant Staphylococcus aureus (MRSA). MRSA is predictably resistant to beta-lactam antibiotics (except ceftaroline). Preferred therapy is vancomycin unless clinically contraindicated. Patient requires contact precautions if  hospitalized. CRITICAL RESULT CALLED TO, READ BACK BY AND VERIFIED WITH: M. Radford Pax Pharm.D. 10:15 06/12/17 ([email or web address])    Methicillin resistance DETECTED (A) NOT DETECTED Final    Comment: CRITICAL RESULT CALLED TO, READ BACK BY AND VERIFIED WITH: M. Radford Pax Pharm.D. 10:15 06/12/17 ([email or web address])    Streptococcus species NOT DETECTED NOT DETECTED Final   Streptococcus agalactiae NOT DETECTED NOT DETECTED Final   Streptococcus pneumoniae NOT DETECTED NOT DETECTED Final   Streptococcus pyogenes NOT DETECTED NOT DETECTED Final   Acinetobacter baumannii NOT DETECTED NOT DETECTED Final   Enterobacteriaceae species NOT DETECTED NOT DETECTED Final   Enterobacter cloacae complex NOT DETECTED NOT DETECTED Final   Escherichia coli NOT DETECTED NOT DETECTED Final   Klebsiella oxytoca NOT DETECTED NOT DETECTED Final   Klebsiella pneumoniae NOT DETECTED NOT DETECTED Final   Proteus species NOT DETECTED NOT DETECTED Final   Serratia marcescens NOT DETECTED NOT DETECTED Final   Haemophilus influenzae NOT DETECTED NOT DETECTED Final   Neisseria meningitidis NOT DETECTED NOT DETECTED Final   Pseudomonas aeruginosa NOT DETECTED NOT DETECTED Final   Candida albicans NOT DETECTED NOT DETECTED Final   Candida glabrata NOT DETECTED NOT DETECTED Final   Candida krusei NOT DETECTED NOT DETECTED Final   Candida parapsilosis NOT DETECTED NOT DETECTED Final   Candida tropicalis NOT DETECTED NOT DETECTED Final  MRSA PCR Screening     Status: None   Collection Time: 06/12/17  4:36 AM  Result Value Ref Range Status   MRSA by PCR NEGATIVE NEGATIVE Final    Comment:        The  GeneXpert MRSA Assay (FDA approved for NASAL specimens only), is one component of a comprehensive MRSA colonization surveillance program. It is not intended to diagnose MRSA infection nor to guide or monitor treatment for MRSA infections.   Culture, blood (Routine X 2) w Reflex to ID Panel     Status: None (Preliminary result)   Collection Time: 06/12/17 10:50 AM  Result Value Ref Range Status   Specimen Description BLOOD LEFT ANTECUBITAL  Final   Special Requests   Final    BOTTLES DRAWN AEROBIC ONLY Blood Culture adequate volume   Culture NO GROWTH 2 DAYS  Final   Report Status PENDING  Incomplete  Culture, blood (Routine X 2) w Reflex to ID Panel     Status: None (Preliminary result)   Collection Time: 06/12/17 10:55 AM  Result Value Ref Range Status   Specimen Description BLOOD LEFT HAND  Final   Special Requests   Final    BOTTLES DRAWN AEROBIC ONLY Blood Culture results may not be optimal due to an inadequate volume of blood received in culture bottles   Culture NO GROWTH 2 DAYS  Final   Report Status PENDING  Incomplete  Culture, blood (Routine X 2) w Reflex to ID Panel     Status: None (Preliminary result)   Collection Time: 06/13/17 10:05 AM  Result Value Ref Range Status   Specimen Description BLOOD LEFT HAND  Final   Special Requests IN PEDIATRIC BOTTLE Blood Culture adequate volume  Final   Culture NO GROWTH 1 DAY  Final   Report Status PENDING  Incomplete  Culture, blood (Routine X 2) w Reflex to ID Panel     Status: None (Preliminary result)   Collection Time: 06/13/17 10:05 AM  Result Value Ref Range Status   Specimen Description BLOOD RIGHT HAND  Final   Special Requests IN PEDIATRIC BOTTLE Blood Culture adequate volume  Final   Culture NO GROWTH 1 DAY  Final   Report Status PENDING  Incomplete    Radiology Studies: Dg Tibia/fibula Right Port  Result Date: 06/12/2017 CLINICAL DATA:  Wound abscess. EXAM: PORTABLE RIGHT TIBIA AND FIBULA - 2 VIEW COMPARISON:   09/30/2015 FINDINGS: The bones are diffusely osteopenic. No fracture is identified. The knee and ankle are located. No osseous erosion is seen to suggest active osteomyelitis. Diffuse vascular calcifications are noted. No soft tissue gas is seen. IMPRESSION: No evidence of active osteomyelitis or fracture. Electronically Signed   By: Logan Bores M.D.   On: 06/12/2017 20:36   Scheduled Meds: . hydrocortisone   Rectal BID  . lidocaine  1 patch Transdermal Q24H  . loperamide  4 mg Oral NOW  . mouth rinse  15 mL Mouth Rinse BID   Continuous Infusions:   LOS: 3 days   Kerney Elbe, DO [facility] Pager [phone]  If 7PM-7AM, please contact night-coverage [email or web address] Password [facility] 06/14/2017, 4:49 PM

## 2017-06-14 NOTE — Progress Notes (Signed)
Seen by palliative NP with order for comfort care.family at bedside.

## 2017-06-14 NOTE — Progress Notes (Addendum)
INFECTIOUS DISEASE ATTENDING ADDENDUM:   Date: 06/14/2017  Patient name: [name]  Medical record number: [id number]  Date of birth: 1919-01-18    This patient has been seen and discussed with the house staff. Please see the resident's note for complete details. I concur with their findings with the following additions/corrections:  See my separate note.  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 She is on non-rebreather mask and has low bp ranging 70-98/42-48 over the past 24 hrs. -Stop iv vancomycin due to patient's continued worsening of kidney function. Today her cr=2.08 -Start Teflaro today 06/14/17 -Blood culture 8/2  Shows no growth for 1 day. Continue till no growth for 5 days.            Goals of care: -Continue aggressive therapy with antibiotics.  -Pt is DNR and does not want to have pacemaker replaced     . hydrocortisone   Rectal BID  . lidocaine  1 patch Transdermal Q24H  . loperamide  4 mg Oral NOW  . mouth rinse  15 mL Mouth Rinse BID    SUBJECTIVE: [name] was seen laying in her bed with non-rebreather mask on. She was in acute distress and not very communicative today. Her daughter at bedside was very worried about her prognosis.   Review of Systems: ROS patient was not interactive   Allergies  Allergen Reactions  . Amiodarone Hcl     REACTION: INTOL to Amiodarone in past  . Codeine Nausea And Vomiting  . Fentanyl     REACTION: causes nausea--dizziness  . Lamisil [Terbinafine Hcl]     Causes nausea  . Other     OBJECTIVE: Vitals:   06/14/17 0800 06/14/17 1150 06/14/17 1200 06/14/17 1530    BP:   (!) 89/47   Pulse:   74 75  Resp:   17 19  Temp: 99.4 F (37.4 C) 98.1 F (36.7 C)  (!) 97.4 F (36.3 C)  TempSrc: Axillary Axillary  Oral  SpO2:   98% 93%  Weight:      Height:       Body mass index is 20.04 kg/m.  Physical Exam  Constitutional: She appears distressed.  HENT:  Head: Normocephalic and atraumatic.  Cardiovascular: Normal rate, regular rhythm, normal heart sounds and intact distal pulses.   Pulmonary/Chest: Breath sounds normal. She is in respiratory distress.  Abdominal: She exhibits no distension.  Neurological:  Patient was not alert   Skin: Bruising, petechiae, purpura and rash noted. There is erythema.  Janeway lesion noted on right hand. New skin breakdown present on right arm.     Lab Results Lab Results  Component Value Date   WBC 10.6 (H) 06/14/2017   HGB 12.3 06/14/2017   HCT 36.7 06/14/2017   MCV 105.2 (H) 06/14/2017   PLT 54 (L) 06/14/2017    Lab Results  Component Value Date   CREATININE 2.08 (H) 06/14/2017   BUN 61 (H) 06/14/2017   NA 135 06/14/2017   K  6.1 (H) 06/14/2017   CL 107 06/14/2017   CO2 14 (L) 06/14/2017    Lab Results  Component Value Date   ALT 21 06/14/2017   AST 104 (H) 06/14/2017   ALKPHOS 56 06/14/2017   BILITOT 2.5 (H) 06/14/2017     Microbiology: Recent Results (from the past 240 hour(s))  Urine culture     Status: Abnormal   Collection Time: 05/22/2017  2:37 PM  Result Value Ref Range Status   Specimen Description URINE, RANDOM  Final   Special Requests NONE  Final   Culture >=100,000 COLONIES/mL ESCHERICHIA COLI (A)  Final   Report Status 06/14/2017 FINAL  Final   Organism ID, Bacteria ESCHERICHIA COLI (A)  Final      Susceptibility   Escherichia coli - MIC*    AMPICILLIN <=2 SENSITIVE Sensitive     CEFAZOLIN <=4 SENSITIVE Sensitive     CEFTRIAXONE <=1 SENSITIVE Sensitive     CIPROFLOXACIN <=0.25 SENSITIVE Sensitive     GENTAMICIN <=1 SENSITIVE Sensitive     IMIPENEM <=0.25 SENSITIVE  Sensitive     NITROFURANTOIN <=16 SENSITIVE Sensitive     TRIMETH/SULFA <=20 SENSITIVE Sensitive     AMPICILLIN/SULBACTAM <=2 SENSITIVE Sensitive     PIP/TAZO <=4 SENSITIVE Sensitive     Extended ESBL NEGATIVE Sensitive     * >=100,000 COLONIES/mL ESCHERICHIA COLI  Culture, blood (routine x 2)     Status: Abnormal   Collection Time: 05/29/2017  4:01 PM  Result Value Ref Range Status   Specimen Description BLOOD RIGHT WRIST  Final   Special Requests IN PEDIATRIC BOTTLE Blood Culture adequate volume  Final   Culture  Setup Time   Final    GRAM POSITIVE COCCI IN CLUSTERS AEROBIC BOTTLE ONLY CRITICAL VALUE NOTED.  VALUE IS CONSISTENT WITH PREVIOUSLY REPORTED AND CALLED VALUE.    Culture (A)  Final    STAPHYLOCOCCUS AUREUS SUSCEPTIBILITIES PERFORMED ON PREVIOUS CULTURE WITHIN THE LAST 5 DAYS.    Report Status 06/14/2017 FINAL  Final  Culture, blood (routine x 2)     Status: Abnormal   Collection Time: 06/06/2017  4:12 PM  Result Value Ref Range Status   Specimen Description BLOOD RIGHT ANTECUBITAL  Final   Special Requests   Final    BOTTLES DRAWN AEROBIC AND ANAEROBIC Blood Culture adequate volume   Culture  Setup Time   Final    GRAM POSITIVE COCCI IN CLUSTERS IN BOTH AEROBIC AND ANAEROBIC BOTTLES CRITICAL RESULT CALLED TO, READ BACK BY AND VERIFIED WITH: Leonie Green Pharm.D. 10:15 06/12/17 ([email or web address])    Culture METHICILLIN RESISTANT STAPHYLOCOCCUS AUREUS (A)  Final   Report Status 06/14/2017 FINAL  Final   Organism ID, Bacteria METHICILLIN RESISTANT STAPHYLOCOCCUS AUREUS  Final      Susceptibility   Methicillin resistant staphylococcus aureus - MIC*    CIPROFLOXACIN >=8 RESISTANT Resistant     ERYTHROMYCIN >=8 RESISTANT Resistant     GENTAMICIN <=0.5 SENSITIVE Sensitive     OXACILLIN >=4 RESISTANT Resistant     TETRACYCLINE <=1 SENSITIVE Sensitive     VANCOMYCIN 1 SENSITIVE Sensitive     TRIMETH/SULFA >=320 RESISTANT Resistant     CLINDAMYCIN <=0.25 SENSITIVE Sensitive     RIFAMPIN  <=0.5 SENSITIVE Sensitive     Inducible Clindamycin NEGATIVE Sensitive     * METHICILLIN RESISTANT STAPHYLOCOCCUS AUREUS  Blood Culture ID Panel (Reflexed)     Status: Abnormal   Collection Time: 05/20/2017  4:12 PM  Result Value Ref Range Status   Enterococcus species  NOT DETECTED NOT DETECTED Final   Listeria monocytogenes NOT DETECTED NOT DETECTED Final   Staphylococcus species DETECTED (A) NOT DETECTED Final    Comment: CRITICAL RESULT CALLED TO, READ BACK BY AND VERIFIED WITH: M. Radford Pax Pharm.D. 10:15 06/12/17 ([email or web address])    Staphylococcus aureus DETECTED (A) NOT DETECTED Final    Comment: Methicillin (oxacillin)-resistant Staphylococcus aureus (MRSA). MRSA is predictably resistant to beta-lactam antibiotics (except ceftaroline). Preferred therapy is vancomycin unless clinically contraindicated. Patient requires contact precautions if  hospitalized. CRITICAL RESULT CALLED TO, READ BACK BY AND VERIFIED WITH: M. Radford Pax Pharm.D. 10:15 06/12/17 ([email or web address])    Methicillin resistance DETECTED (A) NOT DETECTED Final    Comment: CRITICAL RESULT CALLED TO, READ BACK BY AND VERIFIED WITH: M. Radford Pax Pharm.D. 10:15 06/12/17 ([email or web address])    Streptococcus species NOT DETECTED NOT DETECTED Final   Streptococcus agalactiae NOT DETECTED NOT DETECTED Final   Streptococcus pneumoniae NOT DETECTED NOT DETECTED Final   Streptococcus pyogenes NOT DETECTED NOT DETECTED Final   Acinetobacter baumannii NOT DETECTED NOT DETECTED Final   Enterobacteriaceae species NOT DETECTED NOT DETECTED Final   Enterobacter cloacae complex NOT DETECTED NOT DETECTED Final   Escherichia coli NOT DETECTED NOT DETECTED Final   Klebsiella oxytoca NOT DETECTED NOT DETECTED Final   Klebsiella pneumoniae NOT DETECTED NOT DETECTED Final   Proteus species NOT DETECTED NOT DETECTED Final   Serratia marcescens NOT DETECTED NOT DETECTED Final   Haemophilus influenzae NOT DETECTED NOT DETECTED Final   Neisseria meningitidis NOT DETECTED NOT  DETECTED Final   Pseudomonas aeruginosa NOT DETECTED NOT DETECTED Final   Candida albicans NOT DETECTED NOT DETECTED Final   Candida glabrata NOT DETECTED NOT DETECTED Final   Candida krusei NOT DETECTED NOT DETECTED Final   Candida parapsilosis NOT DETECTED NOT DETECTED Final   Candida tropicalis NOT DETECTED NOT DETECTED Final  MRSA PCR Screening     Status: None   Collection Time: 06/12/17  4:36 AM  Result Value Ref Range Status   MRSA by PCR NEGATIVE NEGATIVE Final    Comment:        The GeneXpert MRSA Assay (FDA approved for NASAL specimens only), is one component of a comprehensive MRSA colonization surveillance program. It is not intended to diagnose MRSA infection nor to guide or monitor treatment for MRSA infections.   Culture, blood (Routine X 2) w Reflex to ID Panel     Status: None (Preliminary result)   Collection Time: 06/12/17 10:50 AM  Result Value Ref Range Status   Specimen Description BLOOD LEFT ANTECUBITAL  Final   Special Requests   Final    BOTTLES DRAWN AEROBIC ONLY Blood Culture adequate volume   Culture NO GROWTH 2 DAYS  Final   Report Status PENDING  Incomplete  Culture, blood (Routine X 2) w Reflex to ID Panel     Status: None (Preliminary result)   Collection Time: 06/12/17 10:55 AM  Result Value Ref Range Status   Specimen Description BLOOD LEFT HAND  Final   Special Requests   Final    BOTTLES DRAWN AEROBIC ONLY Blood Culture results may not be optimal due to an inadequate volume of blood received in culture bottles   Culture NO GROWTH 2 DAYS  Final   Report Status PENDING  Incomplete  Culture, blood (Routine X 2) w Reflex to ID Panel     Status: None (Preliminary result)   Collection Time: 06/13/17 10:05 AM  Result Value Ref Range Status   Specimen Description BLOOD LEFT HAND  Final  Special Requests IN PEDIATRIC BOTTLE Blood Culture adequate volume  Final   Culture NO GROWTH 1 DAY  Final   Report Status PENDING  Incomplete  Culture, blood  (Routine X 2) w Reflex to ID Panel     Status: None (Preliminary result)   Collection Time: 06/13/17 10:05 AM  Result Value Ref Range Status   Specimen Description BLOOD RIGHT HAND  Final   Special Requests IN PEDIATRIC BOTTLE Blood Culture adequate volume  Final   Culture NO GROWTH 1 DAY  Final   Report Status PENDING  Incomplete    Lars Mage, MD [facility] [phone] pager   [phone] cell 06/14/2017, 4:50 PM

## 2017-06-15 DIAGNOSIS — Z515 Encounter for palliative care: Secondary | ICD-10-CM

## 2017-06-15 MED ORDER — TRAMADOL-ACETAMINOPHEN 37.5-325 MG PO TABS: 1.0000 | ORAL_TABLET | Freq: Three times a day (TID) | ORAL | Status: DC

## 2017-06-15 MED ORDER — TRAMADOL HCL 50 MG PO TABS
50.0000 mg | ORAL_TABLET | Freq: Four times a day (QID) | ORAL | Status: DC | PRN
  Administered 2017-06-15: 50 mg via ORAL
  Filled 2017-06-15: qty 1

## 2017-06-15 MED ORDER — TRAMADOL HCL 50 MG PO TABS: 50.0000 mg | ORAL_TABLET | Freq: Three times a day (TID) | ORAL | Status: DC | PRN

## 2017-06-15 MED ORDER — MORPHINE SULFATE (CONCENTRATE) 10 MG/0.5ML PO SOLN
5.0000 mg | ORAL | Status: DC | PRN
  Administered 2017-06-16: 5 mg via ORAL
  Filled 2017-06-15: qty 0.5

## 2017-06-15 MED ORDER — MORPHINE SULFATE (CONCENTRATE) 10 MG/0.5ML PO SOLN
5.0000 mg | ORAL | Status: DC | PRN
  Administered 2017-06-15: 10 mg via ORAL
  Administered 2017-06-15: 5 mg via ORAL
  Filled 2017-06-15 (×4): qty 0.5

## 2017-06-15 MED ORDER — ACETAMINOPHEN 650 MG RE SUPP
650.0000 mg | RECTAL | Status: DC | PRN
  Administered 2017-06-16 – 2017-06-17 (×3): 650 mg via RECTAL
  Filled 2017-06-15 (×3): qty 1

## 2017-06-15 NOTE — Progress Notes (Signed)
PROGRESS NOTE    [name]  UUV:[id number] DOB: 01/02/1919 DOA: 05/13/2017 PCP: [facility], FNP  Brief Narrative:  [name] is a 83 y.o. female with history of HTN, CAD,  h/o ASD repair, permanent AF s/p AVN ablation (x2) w/PPM, on chronic coumadin, h/o Diastolic CHF, h/o venous insufficiency, chronic leg wound presented to the ED due to above complaints. Daughter reports patient was recently treated for community acquired pneumonia, then got fluids overload. Her cardiologist recently increased her lasix to 41m daily a month ago. Per chart review, she was just seen by cardiology on 7/24 with "OK"volume status and renal function.Daughter report patient weight still up about 1.5lbs (84.5lbs) from target weight (83lbs) She is brought to the ED due to sob, weakness and confusion. She was found to have a MRSA bacteremia and ID, Cardiology, Electrophysiology, and Palliative Care Medicine were consulted for further evaluation and recommendations. Palliative Care met with the family and patient's code status was changed to DNR. Her Cr increased so she was given a small Fluid Bolus. Cardiology signed off as they have nothing else to offer and ID may consider changing Abx given Cr Worsening. ID changed Abx to Teflaro as patient's Cr continued to Worsen. Patient had multiple skin tears overnight from attempted blood sticks and removed her IV Access. After discussion with Palliative Care Medicine, family elected to pursue full comfort measures so all aggressive measures not benefiting the patient's comfort were discontinued. Patient was transferred out of SDU to 6N and pain medication was adjusted by Palliative Care medicine. Prognosis is extremely poor and likely in hospital death is anticipated.  Assessment & Plan:   Principal Problem:   Pacemaker infection ([place] Active Problems:   HYPERCHOLESTEROLEMIA   Chronic pain syndrome   Coronary atherosclerosis   BACK PAIN, LUMBAR   Atrial  fibrillation (HCC)   Permanent atrial fibrillation (HCC)   Bacteriuria with pyuria   Hypoxia   Acute hypoxemic respiratory failure (HCC)   Acute diastolic CHF (congestive heart failure) (HCC)   MRSA bacteremia   Pyuria   AKI (acute kidney injury) ([place]   Thrombocytopenia ([place]   DNR (do not resuscitate)   Palliative care by specialist   Altered mental status   Wound abscess   Endocarditis of native valve   Advance care planning   Terminal care  MRSA Bacteremia -Patient has 2/2 Blood Cx positive for MRSA; Repeat Cx are Negative  -Pacemaker is likely also infected. Electrophysiologist was consulted and they recommend against extraction and recommending palliative Abx Strategy -Transthoracic ECHO was done and showed Normal LV size with EF 55-60%. Moderate diastolic dysfunction. Moderately dilated RV with moderately decreased systolic function. D-shaped interventricular septum suggestive of RV  pressure/volume overload. Mild aortic stenosis. Heavily calcified  mitral valve and annulus with moderate to severe mitral regurgitation. The Cardiologist reading the ECHO was concerned that there may be a small  vegetation at the tip of the posterior leaflet. Moderate pulmonary hypertension. Severe biatrial enlargement. Moderate TR. There is a mobile mass noted flickering in and out of view in one  image of the right atrium. Possible vegetation on pacemaker lead. -Will not Pursue TEE -IV Vancomycin was changed to Teflaro by ID; Now discontinued as patient is Full Comfort Measures. -WBC went from 10.1 -> 11.7 -> 9.7 -> 10.6 -Palliative Care Consulted for further Goals of Care and Code Status changed to DNR. After discussion with the Palliative Care Team patient is now Full Comfort Measures and has Robinul for secretions,  Haldol for Agitation/Delirium, Lidocaine Patch and po Morphine for Pain and Lorazepam for Anxiety.  -Palliative added scheduled Ultracet q8h and Tramadol 50 mg po q8hprn; Morphine  Concentrate was increased to a range of 5-10 mg q2hprn as family felt 5 mg was too inadequate  -High Risk of Decompensation and In Hospital Death is expected.   Acute hypoxic respiratory failure Suspect from acute on chronic diastolic chf exacerbation and possible ?PNA  -She received iv lasix 51m in the ED x1,  No further Diruesis  -[facility]Cardiology consulted and recommending holding diuretics and obtaining a Transthoracic ECHO -Hypoxia worsening and after Palliative Discussion with Family no escalation of Care -Patient is changed to DNR/DNI -Patient was placed on 80% Face tent but did not tolerate so is back on NRB and saturating 86% -Prognosis is extremely poor and [facility] Full Com[facility] Me[facility]  Confusion, likely Metabolic encephalopathy from Infectious Etiology -Family report patient is more confused than her baseline, hard to get any history from patient -Continue to Monitor -SLP ordered and recommending resuming a regular consistency diet and thin liquids and continuing cruhsing meds with puree -Likely from Bacteremia and Infection of Pacer  -Patient made Comfort Measures so all aggressive measures not benefiting the patient's comfort were discontinued.    Pyuria with Bacteruria -U/A with many bacteria, wbc TNTC, RBC TNTC, denies back pain, denies dysuria ( per daughter) -Urine culture pending -Rocephin Discontinued by Infectious Diseases as they do not believe she has a UTI -Patient now Full Comfort Measures  AKI, worsened -Likley from Heart Failure and Vancomycin -BUN/Cr went from 24/1.32 -> 24/1.32 -> 47/1.64 -> 61/2.08 -IV Vancomycin changed to Teflaro but now D/C'd now that patient is Full Comfort.  -Will not Repeat CMP in AM now that patient is Comfort Measures  Thrombocytopenia -Acute on chronic, noted platelet started to trend down since 03/2017 -From liver congestion?  From acute infection? Less likely MAHA, no  mention of schistocytes, no recent heparin exposure ( has been on coumadin),  -will check fractionated bili -Monitor, consider stop coumadin if plt less than 50. Platelet Count was 510yesterday AM  -No acute bleeding -Will not Repeat CBC in AM now that patient is Full Comfort  Mild elevation of LFT, possibly from liver congestion -AST went from 83 -> 76 -> 104 -ALT went from 19 -> 18 -> 21 -Will not Repeat CMP in AM now that Patient is Full Comfort  Hyperkalemia -K+ went from 5.2 -> 4.2 -> 6.1 -Given Kayexalate yesterday AM  -Will not Continue to Monitor and Repeat CMP in AM now that patient is Comfort   Chronic Afib, s/p pacemaker,  -Currently paced rhythm -Not on rate control agent at home but on Anticoagulation with Coumadin; Coumadin was now stopped -PT-INR was 30.8-2.88 -Will not Repeat PT INR in AM now that patient is Comfort Measures   Chronic leg wounds -Suspect possibly the cause of the bacteremia -Wound care consulted and appreciated Recc's -Patient is now Comfort  FTT -Patient is now Comfort Measures and likely will not survive hospitalization   Mildly Elevated Troponin -Likely demand ischemia in the setting of Atrial Fibrillation and Infection -Cardiology Consulted for further evaluation and Recommendations; Cardiology has nothing else to offer -Patient is now Comfort Measures  DVT prophylaxis: None now that patient is Comfort Measures Code Status: DO NOT RESUSCITATE Family Communication: Discussed with Daughter and Sonat bedside Disposition Plan: Transferred to 6N. High Risk of Decompensation; Anticipated In Hospital Death  Consultants:   Infectious Diseases  Cardiology  Electrophysiology  Palliative Care Medicine   Procedures:  ECHOCARDIOGRAM Study Conclusions  - Left ventricle: The cavity size was normal. Wall thickness was   normal. Systolic function was normal. The estimated ejection   fraction was in the range of 55% to 60%. Wall  motion was normal;   there were no regional wall motion abnormalities. Features are   consistent with a pseudonormal left ventricular filling pattern,   with concomitant abnormal relaxation and increased filling   pressure (grade 2 diastolic dysfunction). - Ventricular septum: D-shaped interventricular septum suggestive   of RV pressure/volume overload. - Aortic valve: Trileaflet; moderately calcified leaflets. There   was mild stenosis. There was trivial regurgitation. Mean gradient   (S): 9 mm Hg. - Mitral valve: Severely calcified annulus. Moderately calcified   leaflets. There is a possible vegetation (small) noted near the   tip of the posterior leaflet. There was moderate to severe   regurgitation. - Left atrium: The atrium was severely dilated. - Right ventricle: The cavity size was moderately dilated. Pacer   wire or catheter noted in right ventricle. Systolic function was   moderately reduced. - Right atrium: The atrium was severely dilated. In one view (image   51), a small mobile mass occasionally flickers into view in the   right atrium, ?vegetation on pacemaker lead. - Tricuspid valve: There was moderate regurgitation. Peak RV-RA   gradient (S): 51 mm Hg. - Pulmonary arteries: PA peak pressure: 59 mm Hg (S). - Systemic veins: IVC measured 2.1 cm with < 50% respirophasic   variation, suggesting RA pressure 8 mmHg. - Pericardium, extracardiac: A trivial pericardial effusion was   identified.  Impressions:  - Normal LV size with EF 55-60%. Moderate diastolic dysfunction.   Moderately dilated RV with moderately decreased systolic   function. D-shaped interventricular septum suggestive of RV   pressure/volume overload. Mild aortic stenosis. Heavily calcified   mitral valve and annulus with moderate to severe mitral   regurgitation. I am concerned that there may be a small   vegetation at the tip of the posterior leaflet. Moderate   pulmonary hypertension. Severe  biatrial enlargement. Moderate TR.   There is a mobile mass noted flickering in and out of view in one   image of the right atrium. Possible vegetation on pacemaker lead.   This patient needs a TEE.   Antimicrobials: Anti-infectives    Start     Dose/Rate Route Frequency Ordered Stop   06/14/17 1000  vancomycin (VANCOCIN) 500 mg in sodium chloride 0.9 % 100 mL IVPB  Status:  Discontinued     500 mg 100 mL/hr over 60 Minutes Intravenous Every 48 hours 06/12/17 0940 06/14/17 0845   06/14/17 1000  ceftaroline (TEFLARO) injection SOLR 400 mg  Status:  Discontinued     400 mg Intravenous Every 12 hours 06/14/17 0845 06/14/17 0849   06/14/17 1000  ceftaroline (TEFLARO) 200 mg in sodium chloride 0.9 % 250 mL IVPB  Status:  Discontinued     200 mg 250 mL/hr over 60 Minutes Intravenous Every 12 hours 06/14/17 0849 06/14/17 1643   06/12/17 1000  vancomycin (VANCOCIN) IVPB 750 mg/150 ml premix     750 mg 150 mL/hr over 60 Minutes Intravenous  Once 06/12/17 0940 06/12/17 1100   06/06/2017 2145  cefTRIAXone (ROCEPHIN) 1 g in dextrose 5 % 50 mL IVPB  Status:  Discontinued     1 g 100 mL/hr over 30 Minutes Intravenous Every 24  hours 05/19/2017 2136 06/12/17 1039   05/31/2017 1545  cefTRIAXone (ROCEPHIN) 1 g in dextrose 5 % 50 mL IVPB     1 g 100 mL/hr over 30 Minutes Intravenous  Once 06/03/2017 1543 05/16/2017 1718     Subjective: Patient was seen and examined at bedside and was resting and not very interactive and minimally responsive. Family states she will wake up every few hours and complain of back pain. No nausea or vomiting. Patient's family feels as if imodium prn helped patient's Abdominal discomfort. Patient wearing [place] now.   Objective: Vitals:   06/14/17 1944 06/14/17 2337 06/15/17 0000 06/15/17 0300  BP: (!) 100/55     Pulse: 77  73 (!) 26  Resp: _0 Temp: 97.7 F (36.5 C) 98.3 F (36.8 C)    TempSrc: Axillary Axillary    SpO2: 98% 98% 92% 100%  Weight:      Height:         Intake/Output Summary (Last 24 hours) at 06/15/17 1709 Last data filed at 06/14/17 2200  Gross per 24 hour  Intake                0 ml  Output                1 ml  Net               -1 ml   Filed Weights   05/12/2017 1331 06/14/17 0600  Weight: 40.8 kg (90 lb) 42 kg (92 lb 9.5 oz)   Examination: Physical Exam:  Constitutional: Thin cachectic frail elderly female who is resting comfortably in NAD Eyes: Lids appear normal ENMT: External Ears and nose appear normal Neck: Supple with no JVD Respiratory: Coarse breath sounds with mild wheezing; Patient was not tachypenic and was wearing Supplemental O2 via  Cardiovascular: Irregularly irregular; Minimal extremity edema Abdomen: Soft, NT, ND. Bowel sounds positive GU: Deferred Musculoskeletal: No contractures; Has some extremity cyanosis Skin: Bilateral lower extremities are wrapped. Feet are cool too the touch. Has multiple skin tears Neurologic: CN 2-12 grossly intact. No appreciable focal deficits Psychiatric: Impaired judgement and insight. Not alert.  Data Reviewed: I have personally reviewed following labs and imaging studies  CBC:  Recent Labs Lab 05/27/2017 1612 06/12/17 0314 06/13/17 0516 06/14/17 0644  WBC 10.1 11.7* 9.7 10.6*  NEUTROABS 9.7*  --  8.7* 9.3*  HGB 13.2 13.4 12.5 12.3  HCT 40.2 40.4 36.8 36.7  MCV 109.5* 108.9* 106.1* 105.2*  PLT 59* 50* 55* 54*   Basic Metabolic Panel:  Recent Labs Lab 06/04/2017 1612 06/12/17 0314 06/13/17 0516 06/14/17 0644  NA 139 137 137 135  K 5.2* 5.0 4.2 6.1*  CL 104 105 106 107  CO2 23 21* 23 14*  GLUCOSE 89 68 86 76  BUN 24* 28* 47* 61*  CREATININE 1.32* 1.36* 1.64* 2.08*  CALCIUM 9.1 8.4* 7.8* 7.7*  MG  --   --  2.0 1.9  PHOS  --   --  4.2 4.3   GFR: Estimated Creatinine Clearance: 9.6 mL/min (A) (by C-G formula based on SCr of 2.08 mg/dL (H)). Liver Function Tests:  Recent Labs Lab 05/14/2017 1612 06/12/17 0314 06/13/17 0516 06/14/17 0644  AST 82*  83* 76* 104*  ALT _1 ALKPHOS 75 66 57 56  BILITOT 1.6* 1.5* 1.6* 2.5*  PROT 6.2* 5.9* 5.2* 5.0*  ALBUMIN 3.3* 2.9* 2.6* 2.6*   No results for input(s): LIPASE, AMYLASE in  the last 168 hours. No results for input(s): AMMONIA in the last 168 hours. Coagulation Profile:  Recent Labs Lab 06/12/17 0125 06/12/17 0314 06/13/17 0516 06/14/17 0644  INR 3.44 3.32 2.88 2.69   Cardiac Enzymes: No results for input(s): CKTOTAL, CKMB, CKMBINDEX, TROPONINI in the last 168 hours. BNP (last 3 results) No results for input(s): PROBNP in the last 8760 hours. HbA1C: No results for input(s): HGBA1C in the last 72 hours. CBG: No results for input(s): GLUCAP in the last 168 hours. Lipid Profile: No results for input(s): CHOL, HDL, LDLCALC, TRIG, CHOLHDL, LDLDIRECT in the last 72 hours. Thyroid Function Tests: No results for input(s): TSH, T4TOTAL, FREET4, T3FREE, THYROIDAB in the last 72 hours. Anemia Panel: No results for input(s): VITAMINB12, FOLATE, FERRITIN, TIBC, IRON, RETICCTPCT in the last 72 hours. Sepsis Labs:  Recent Labs Lab 05/14/2017 1641 06/12/17 0931 06/12/17 1058  LATICACIDVEN 4.02* 1.8 1.8    Recent Results (from the past 240 hour(s))  Urine culture     Status: Abnormal   Collection Time: 05/21/2017  2:37 PM  Result Value Ref Range Status   Specimen Description URINE, RANDOM  Final   Special Requests NONE  Final   Culture >=100,000 COLONIES/mL ESCHERICHIA COLI (A)  Final   Report Status 06/14/2017 FINAL  Final   Organism ID, Bacteria ESCHERICHIA COLI (A)  Final      Susceptibility   Escherichia coli - MIC*    AMPICILLIN <=2 SENSITIVE Sensitive     CEFAZOLIN <=4 SENSITIVE Sensitive     CEFTRIAXONE <=1 SENSITIVE Sensitive     CIPROFLOXACIN <=0.25 SENSITIVE Sensitive     GENTAMICIN <=1 SENSITIVE Sensitive     IMIPENEM <=0.25 SENSITIVE Sensitive     NITROFURANTOIN <=16 SENSITIVE Sensitive     TRIMETH/SULFA <=20 SENSITIVE Sensitive     AMPICILLIN/SULBACTAM <=2  SENSITIVE Sensitive     PIP/TAZO <=4 SENSITIVE Sensitive     Extended ESBL NEGATIVE Sensitive     * >=100,000 COLONIES/mL ESCHERICHIA COLI  Culture, blood (routine x 2)     Status: Abnormal   Collection Time: 06/03/2017  4:01 PM  Result Value Ref Range Status   Specimen Description BLOOD RIGHT WRIST  Final   Special Requests IN PEDIATRIC BOTTLE Blood Culture adequate volume  Final   Culture  Setup Time   Final    GRAM POSITIVE COCCI IN CLUSTERS AEROBIC BOTTLE ONLY CRITICAL VALUE NOTED.  VALUE IS CONSISTENT WITH PREVIOUSLY REPORTED AND CALLED VALUE.    Culture (A)  Final    STAPHYLOCOCCUS AUREUS SUSCEPTIBILITIES PERFORMED ON PREVIOUS CULTURE WITHIN THE LAST 5 DAYS.    Report Status 06/14/2017 FINAL  Final  Culture, blood (routine x 2)     Status: Abnormal   Collection Time: 06/04/2017  4:12 PM  Result Value Ref Range Status   Specimen Description BLOOD RIGHT ANTECUBITAL  Final   Special Requests   Final    BOTTLES DRAWN AEROBIC AND ANAEROBIC Blood Culture adequate volume   Culture  Setup Time   Final    GRAM POSITIVE COCCI IN CLUSTERS IN BOTH AEROBIC AND ANAEROBIC BOTTLES CRITICAL RESULT CALLED TO, READ BACK BY AND VERIFIED WITH: Leonie Green Pharm.D. 10:15 06/12/17 ([email or web address])    Culture METHICILLIN RESISTANT STAPHYLOCOCCUS AUREUS (A)  Final   Report Status 06/14/2017 FINAL  Final   Organism ID, Bacteria METHICILLIN RESISTANT STAPHYLOCOCCUS AUREUS  Final      Susceptibility   Methicillin resistant staphylococcus aureus - MIC*    CIPROFLOXACIN >=8 RESISTANT Resistant  ERYTHROMYCIN >=8 RESISTANT Resistant     GENTAMICIN <=0.5 SENSITIVE Sensitive     OXACILLIN >=4 RESISTANT Resistant     TETRACYCLINE <=1 SENSITIVE Sensitive     VANCOMYCIN 1 SENSITIVE Sensitive     TRIMETH/SULFA >=320 RESISTANT Resistant     CLINDAMYCIN <=0.25 SENSITIVE Sensitive     RIFAMPIN <=0.5 SENSITIVE Sensitive     Inducible Clindamycin NEGATIVE Sensitive     * METHICILLIN RESISTANT STAPHYLOCOCCUS AUREUS    Blood Culture ID Panel (Reflexed)     Status: Abnormal   Collection Time: 06/07/2017  4:12 PM  Result Value Ref Range Status   Enterococcus species NOT DETECTED NOT DETECTED Final   Listeria monocytogenes NOT DETECTED NOT DETECTED Final   Staphylococcus species DETECTED (A) NOT DETECTED Final    Comment: CRITICAL RESULT CALLED TO, READ BACK BY AND VERIFIED WITH: MVonita Moss.D. 10:15 06/12/17 ([email or web address])    Staphylococcus aureus DETECTED (A) NOT DETECTED Final    Comment: Methicillin (oxacillin)-resistant Staphylococcus aureus (MRSA). MRSA is predictably resistant to beta-lactam antibiotics (except ceftaroline). Preferred therapy is vancomycin unless clinically contraindicated. Patient requires contact precautions if  hospitalized. CRITICAL RESULT CALLED TO, READ BACK BY AND VERIFIED WITH: M. Radford Pax Pharm.D. 10:15 06/12/17 ([email or web address])    Methicillin resistance DETECTED (A) NOT DETECTED Final    Comment: CRITICAL RESULT CALLED TO, READ BACK BY AND VERIFIED WITH: M. Radford Pax Pharm.D. 10:15 06/12/17 ([email or web address])    Streptococcus species NOT DETECTED NOT DETECTED Final   Streptococcus agalactiae NOT DETECTED NOT DETECTED Final   Streptococcus pneumoniae NOT DETECTED NOT DETECTED Final   Streptococcus pyogenes NOT DETECTED NOT DETECTED Final   Acinetobacter baumannii NOT DETECTED NOT DETECTED Final   Enterobacteriaceae species NOT DETECTED NOT DETECTED Final   Enterobacter cloacae complex NOT DETECTED NOT DETECTED Final   Escherichia coli NOT DETECTED NOT DETECTED Final   Klebsiella oxytoca NOT DETECTED NOT DETECTED Final   Klebsiella pneumoniae NOT DETECTED NOT DETECTED Final   Proteus species NOT DETECTED NOT DETECTED Final   Serratia marcescens NOT DETECTED NOT DETECTED Final   Haemophilus influenzae NOT DETECTED NOT DETECTED Final   Neisseria meningitidis NOT DETECTED NOT DETECTED Final   Pseudomonas aeruginosa NOT DETECTED NOT DETECTED Final   Candida albicans NOT DETECTED NOT DETECTED Final    Candida glabrata NOT DETECTED NOT DETECTED Final   Candida krusei NOT DETECTED NOT DETECTED Final   Candida parapsilosis NOT DETECTED NOT DETECTED Final   Candida tropicalis NOT DETECTED NOT DETECTED Final  MRSA PCR Screening     Status: None   Collection Time: 06/12/17  4:36 AM  Result Value Ref Range Status   MRSA by PCR NEGATIVE NEGATIVE Final    Comment:        The GeneXpert MRSA Assay (FDA approved for NASAL specimens only), is one component of a comprehensive MRSA colonization surveillance program. It is not intended to diagnose MRSA infection nor to guide or monitor treatment for MRSA infections.   Culture, blood (Routine X 2) w Reflex to ID Panel     Status: None (Preliminary result)   Collection Time: 06/12/17 10:50 AM  Result Value Ref Range Status   Specimen Description BLOOD LEFT ANTECUBITAL  Final   Special Requests   Final    BOTTLES DRAWN AEROBIC ONLY Blood Culture adequate volume   Culture NO GROWTH 3 DAYS  Final   Report Status PENDING  Incomplete  Culture, blood (Routine X 2) w Reflex to ID Panel     Status: None (Preliminary result)  Collection Time: 06/12/17 10:55 AM  Result Value Ref Range Status   Specimen Description BLOOD LEFT HAND  Final   Special Requests   Final    BOTTLES DRAWN AEROBIC ONLY Blood Culture results may not be optimal due to an inadequate volume of blood received in culture bottles   Culture NO GROWTH 3 DAYS  Final   Report Status PENDING  Incomplete  Culture, blood (Routine X 2) w Reflex to ID Panel     Status: None (Preliminary result)   Collection Time: 06/13/17 10:05 AM  Result Value Ref Range Status   Specimen Description BLOOD LEFT HAND  Final   Special Requests IN PEDIATRIC BOTTLE Blood Culture adequate volume  Final   Culture NO GROWTH 2 DAYS  Final   Report Status PENDING  Incomplete  Culture, blood (Routine X 2) w Reflex to ID Panel     Status: None (Preliminary result)   Collection Time: 06/13/17 10:05 AM  Result Value  Ref Range Status   Specimen Description BLOOD RIGHT HAND  Final   Special Requests IN PEDIATRIC BOTTLE Blood Culture adequate volume  Final   Culture NO GROWTH 2 DAYS  Final   Report Status PENDING  Incomplete    Radiology Studies: No results found. Scheduled Meds: . hydrocortisone   Rectal BID  . lidocaine  1 patch Transdermal Q24H  . mouth rinse  15 mL Mouth Rinse BID  . traMADol-acetaminophen  1 tablet Oral Q8H   Continuous Infusions:   LOS: 4 days   Kerney Elbe, DO [facility] Pager [phone]  If 7PM-7AM, please contact night-coverage [email or web address] Password [email or web address] 06/15/2017, 5:09 PM

## 2017-06-15 NOTE — Progress Notes (Signed)
Patient calling out for help; PRN medication given. Audible gurgling. Daughter at bedside.

## 2017-06-15 NOTE — Progress Notes (Signed)
Md notified per pt's family request.  Pt takes Tylenol 500 with Tramadol 50 mg  For back pain.  Pt's family would like to alternate between morphine and the above combination.  Will continue to monitor.  Saunders Revel T

## 2017-06-15 NOTE — Progress Notes (Signed)
Responded to consult for end of life. Nurse well summarized pt's status and family acceptance of many in rm (that seemed firmly in place w/ consensus)  for comfort care only.Family was grieving and mostly controlled/resigned, knew pt would be transferred to [facility] for family ease at this time of palliative treatment.   Pt is [employer]. Provided spiritual/emotional/grief support to several family members in rm, esp. daughter w/ whom pt had lived, ministry of presence/touch, and prayer -- which they greatly appreciated. Advised family they can ask nurse to page for chaplain at any time.   06/15/17 1100  Clinical Encounter Type  Visited With Patient and family together;Health care provider  Visit Type Follow-up;Psychological support;Spiritual support;Social support  Referral From Nurse  Spiritual Encounters  Spiritual Needs Prayer;Emotional;Grief support  Stress Factors  Family Stress Factors Family relationships;Health changes;Loss;Loss of control   Gerrit Heck, Chaplain

## 2017-06-15 NOTE — Progress Notes (Signed)
Daily Progress Note   Patient Name: [name]       Date: 06/15/2017 DOB: 05/27/1919  Age: 83 y.o. MRN#: [id number] Attending Physician: Kerney Elbe, DO Primary Care Physician: Golden Circle, FNP Admit Date: 05/30/2017  Reason for Consultation/Follow-up: Non pain symptom management, Pain control and Psychosocial/spiritual support  Subjective: Pt's son and daughter at the bedside. Pt having back pain. Family states that MS04 5 mg was ineffective in relieving pain and requested tramadol with tylenol be given ( this is what pt used at home with good results). Family also waiting for grandson to arrive. Diarrhea improved with immodium Requesting transfer to [facility] tower for comfort care  Length of Stay: 4  Current Medications: Scheduled Meds:  . hydrocortisone   Rectal BID  . lidocaine  1 patch Transdermal Q24H  . mouth rinse  15 mL Mouth Rinse BID  . traMADol-acetaminophen  1 tablet Oral Q8H    Continuous Infusions:   PRN Meds: acetaminophen, acetaminophen, glycopyrrolate **OR** glycopyrrolate, haloperidol **OR** haloperidol, loperamide, LORazepam **OR** LORazepam, morphine CONCENTRATE, traMADol  Physical Exam  Constitutional:  Frail, elderly female. Appears uncomfortable  HENT:  Head: Normocephalic and atraumatic.  Pulmonary/Chest: Effort normal.  resp shallow  Musculoskeletal:  kyphosis  Neurological:  Minimally responsive  Skin: Skin is warm and dry. There is pallor.  Nursing note and vitals reviewed.           Vital Signs: BP (!) 100/55 (BP Location: Right Arm)   Pulse (!) 26   Temp 98.3 F (36.8 C) (Axillary)   Resp 12   Ht 4\' 9"  (1.448 m)   Wt 42 kg (92 lb 9.5 oz)   LMP  (LMP Unknown)   SpO2 100%   BMI 20.04 kg/m  SpO2: SpO2: 100 % O2 Device: O2  Device: Nasal Cannula O2 Flow Rate: O2 Flow Rate (L/min): 5 L/min  Intake/output summary:  Intake/Output Summary (Last 24 hours) at 06/15/17 0916 Last data filed at 06/14/17 2200  Gross per 24 hour  Intake              250 ml  Output                1 ml  Net              249 ml  LBM: Last BM Date: 06/14/17 Baseline Weight: Weight: 40.8 kg (90 lb) Most recent weight: Weight: 42 kg (92 lb 9.5 oz)       Palliative Assessment/Data:    Flowsheet Rows     Most Recent Value  Intake Tab  Referral Department  Hospitalist  Unit at Time of Referral  Intermediate Care Unit  Palliative Care Primary Diagnosis  Sepsis/Infectious Disease  Date Notified  06/12/17  Palliative Care Type  New Palliative care  Reason for referral  Clarify Goals of Care  Date of Admission  06/10/2017  Date first seen by Palliative Care  06/13/17  # of days Palliative referral response time  1 Day(s)  # of days IP prior to Palliative referral  1  Clinical Assessment  Palliative Performance Scale Score  20%  Psychosocial & Spiritual Assessment  Palliative Care Outcomes      Patient Active Problem List   Diagnosis Date Noted  . Advance care planning   . Terminal care   . DNR (do not resuscitate)   . Palliative care by specialist   . Altered mental status   . Wound abscess   . Endocarditis of native valve   . Pacemaker infection ([place]) 06/12/2017  . MRSA bacteremia 06/12/2017  . Pyuria 06/12/2017  . AKI (acute kidney injury) ([place]) 06/12/2017  . Thrombocytopenia ([place]) 06/12/2017  . Acute diastolic CHF (congestive heart failure) ([place])   . Hypoxia 05/27/2017  . Acute hypoxemic respiratory failure ([place]) 05/30/2017  . Decreased hearing 04/24/2017  . Bacteriuria with pyuria 04/24/2017  . Community acquired pneumonia 04/24/2017  . Fall 04/24/2017  . Pill dysphagia 08/15/2016  . Globus sensation 08/01/2016  . Difficulty swallowing pills 05/30/2016  . Bilateral shoulder pain 05/30/2016  . Dizziness  05/30/2016  . Rib pain on left side 11/03/2015  . Wound of skin 10/07/2015  . Hx of completed stroke 10/05/2015  . Traumatic hematoma 09/30/2015  . Permanent atrial fibrillation ([place]) 09/30/2015  . Leg hematoma 09/30/2015  . Cough   . Extrarenal azotemia 12/28/2014  . End of life care 01/29/2014  . Hyponatremia 10/25/2013  . Fracture of multiple pubic rami ([place]) 10/23/2013  . GERD (gastroesophageal reflux disease) 12/29/2012  . Long term current use of anticoagulant 10/29/2011  . Stroke ([place]) 10/23/2011  . Pacemaker  single-chamber-St. Jude's 04/17/2011  . DIASTOLIC HEART FAILURE, CHRONIC 01/08/2011  . Atrial fibrillation ([place]) 12/26/2010  . ANEMIA 12/14/2010  . CAROTID ARTERY DISEASE 07/27/2010  . Chronic pain syndrome 01/23/2010  . Coronary atherosclerosis 01/15/2009  . ATRIAL SEPTAL DEFECT 01/15/2009  . AV BLOCK, COMPLETE 11/03/2008  . DIVERTICULOSIS OF COLON 07/17/2008  . OVERACTIVE BLADDER 07/17/2008  . DEGENERATIVE JOINT DISEASE 07/15/2008  . Osteoporosis 07/15/2008  . HYPERCHOLESTEROLEMIA 09/04/2007  . VENOUS INSUFFICIENCY 09/04/2007  . RESPIRATORY DISORDER, CHRONIC 09/04/2007  . BACK PAIN, LUMBAR 09/04/2007    Palliative Care Assessment & Plan   Patient Profile: 83 y.o.femalewith history of HTN, CAD, h/o ASD repaired, permanent AF s/p AVN ablation (x2) w/PPM, on chronic coumadin, h/o diastolic chf, h/ovenous insufficiency , chronic leg wound.She was admitted on 06/07/2017 with complaints of sob, weakness and confusion. Workup revealed MRSA bacteremia- presumed from infected pacemaker. Patient has continued to decompensate during this admission. IV access has become difficult and family has made decision to transition to comfort measures.   Pt is very weak; minimally responsive. Has been c/o back pain   Recommendations/Plan:  Pain: Continue with tramadol but will schedule ultracet every 8 hours and tramadol 50 mg every 8  hours PRN. Will increase MS04 concentrate to  a range of 5-10 mg every 2 PRN ( Per family 5 mg was inadequate )  Diarrhea: Improving. Continue imodium PRN  Family requesting transfer to [place] and Additional Recommendations:  Limitations on Scope of Treatment: Full Comfort Care  Code Status:    Code Status Orders        Start     Ordered   06/14/17 1652  DNR (Do not attempt resuscitation)  Continuous    Question Answer Comment  In the event of cardiac or respiratory ARREST Do not call a "code blue"   In the event of cardiac or respiratory ARREST Do not perform Intubation, CPR, defibrillation or ACLS   In the event of cardiac or respiratory ARREST Use medication by any route, position, wound care, and other measures to relive pain and suffering. May use oxygen, suction and manual treatment of airway obstruction as needed for comfort.      06/14/17 1651    Code Status History    Date Active Date Inactive Code Status Order ID Comments User Context   06/14/2017  4:49 PM 06/14/2017  4:51 PM DNR [id number]  Earlie Counts, NP Inpatient   06/13/2017  9:55 AM 06/14/2017  4:49 PM DNR [id number]  Asencion Gowda, NP Inpatient   05/12/2017  9:37 PM 06/13/2017  9:55 AM Full Code [id number]  Florencia Reasons, MD ED   09/30/2015  6:53 AM 10/01/2015  3:46 PM DNR [id number]  Rise Patience, MD Inpatient   09/30/2015  5:59 AM 09/30/2015  6:53 AM DNR [id number]  Rise Patience, MD ED   10/23/2013  5:43 PM 10/25/2013  7:44 PM Full Code [id number]  [name], MD Inpatient    Advance Directive Documentation     Most Recent Value  Type of Advance Directive  Healthcare Power of Attorney  Pre-existing out of facility DNR order (yellow form or pink MOST form)  -  "MOST" Form in Place?  -       Prognosis:   Hours - Days  Discharge Planning:  Anticipated Hospital Death   Thank you for allowing the Palliative Medicine Team to assist in the care of this patient.   Time In: 0900 Time Out: 0924 Total Time 24 min Prolonged  Time Billed  no       Greater than 50%  of this time was spent counseling and coordinating care related to the above assessment and plan.  Dory Horn, NP  Please contact Palliative Medicine Team phone at [phone] for questions and concerns.

## 2017-06-16 MED ORDER — ATROPINE SULFATE 1 % OP SOLN
4.0000 [drp] | OPHTHALMIC | Status: DC | PRN
  Administered 2017-06-16: 4 [drp] via SUBLINGUAL
  Filled 2017-06-16: qty 5

## 2017-06-16 MED ORDER — ACETAMINOPHEN 160 MG/5ML PO SOLN
650.0000 mg | Freq: Three times a day (TID) | ORAL | Status: DC | PRN
  Filled 2017-06-16: qty 20.3

## 2017-06-16 NOTE — Progress Notes (Signed)
Patient resting comfortably, respirations even and unlabored; daughter at bedside.

## 2017-06-16 NOTE — Progress Notes (Signed)
Patient resting quietly, respirations even and unlabored. Family at bedside, no concerns expressed.

## 2017-06-16 NOTE — Progress Notes (Signed)
PROGRESS NOTE    [name]  EXB:[id number] DOB: 09/18/1919 DOA: 06/03/2017 PCP: [facility], FNP  Brief Narrative:  [name] is a 83 y.o. female with history of HTN, CAD,  h/o ASD repair, permanent AF s/p AVN ablation (x2) w/PPM, on chronic coumadin, h/o Diastolic CHF, h/o venous insufficiency, chronic leg wound presented to the ED due to above complaints. Daughter reports patient was recently treated for community acquired pneumonia, then got fluids overload. Her cardiologist recently increased her lasix to 23m daily a month ago. Per chart review, she was just seen by cardiology on 7/24 with "OK"volume status and renal function.Daughter report patient weight still up about 1.5lbs (84.5lbs) from target weight (83lbs) She is brought to the ED due to sob, weakness and confusion. She was found to have a MRSA bacteremia and ID, Cardiology, Electrophysiology, and Palliative Care Medicine were consulted for further evaluation and recommendations. Palliative Care met with the family and patient's code status was changed to DNR. Her Cr increased so she was given a small Fluid Bolus. Cardiology signed off as they have nothing else to offer and ID may consider changing Abx given Cr Worsening. ID changed Abx to Teflaro as patient's Cr continued to Worsen. Patient had multiple skin tears overnight from attempted blood sticks and removed her IV Access. After discussion with Palliative Care Medicine, family elected to pursue full comfort measures so all aggressive measures not benefiting the patient's comfort were discontinued. Patient was transferred out of SDU to 6N and pain medication was adjusted by Palliative Care medicine. Patient was resting comfortably this AM. Prognosis is extremely poor and likely in hospital death is anticipated.  Assessment & Plan:   Principal Problem:   Pacemaker infection ([place] Active Problems:   HYPERCHOLESTEROLEMIA   Chronic pain syndrome   Coronary  atherosclerosis   BACK PAIN, LUMBAR   Atrial fibrillation (HCC)   Permanent atrial fibrillation (HCC)   Bacteriuria with pyuria   Hypoxia   Acute hypoxemic respiratory failure (HCC)   Acute diastolic CHF (congestive heart failure) (HCC)   MRSA bacteremia   Pyuria   AKI (acute kidney injury) ([place]   Thrombocytopenia ([facility]   DNR (do not resuscitate)   Palliative care by specialist   Altered mental status   Wound abscess   Endocarditis of native valve   Advance care planning   Terminal care  MRSA Bacteremia -Patient has 2/2 Blood Cx positive for MRSA; Repeat Cx are Negative  -Pacemaker is likely also infected. Electrophysiologist was consulted and they recommend against extraction and recommending palliative Abx Strategy -Transthoracic ECHO was done and showed Normal LV size with EF 55-60%. Moderate diastolic dysfunction. Moderately dilated RV with moderately decreased systolic function. D-shaped interventricular septum suggestive of RV  pressure/volume overload. Mild aortic stenosis. Heavily calcified  mitral valve and annulus with moderate to severe mitral regurgitation. The Cardiologist reading the ECHO was concerned that there may be a small  vegetation at the tip of the posterior leaflet. Moderate pulmonary hypertension. Severe biatrial enlargement. Moderate TR. There is a mobile mass noted flickering in and out of view in one  image of the right atrium. Possible vegetation on pacemaker lead. -Will not Pursue TEE -IV Vancomycin was changed to Teflaro by ID; Now discontinued as patient is Full Comfort Measures. -WBC went from 10.1 -> 11.7 -> 9.7 -> 10.6 -Palliative Care Consulted for further Goals of Care and Code Status changed to DNR. After discussion with the Palliative Care Team patient is now Full Comfort  Measures and has Robinul for secretions, Haldol for Agitation/Delirium, Lidocaine Patch and po Morphine for Pain and Lorazepam for Anxiety.  -Palliative added scheduled Ultracet  q8h and Tramadol 50 mg po q8hprn; Morphine Concentrate was increased to a range of 5-10 mg q2hprn as family felt 5 mg was too inadequate; Patient was also given SL Atropine for gurgling  -High Risk of Decompensation and In Hospital Death is expected.   Acute hypoxic respiratory failure Suspect from acute on chronic diastolic chf exacerbation and possible ?PNA  -She received iv lasix 26m in the ED x1,  No further Diruesis  [facility] and recommending holding diuretics and obtaining a Transthoracic ECHO -Hypoxia worsening and after Palliative Discussion with Family no escalation of Care -Patient is changed to DNR/DNI -Patient was placed on 80% Face tent but did not tolerate so is back on NRB and saturating 86% -Prognosis is extremely poor and [facility] is Full Comfort Measures  Confusion, likely Metabolic encephalopathy from Infectious Etiology -Family report patient is more confused than her baseline, hard to get any history from patient -Continue to Monitor -SLP ordered and recommending resuming a regular consistency diet and thin liquids and continuing cruhsing meds with puree -Likely from Bacteremia and Infection of Pacer  -Patient made Comfort Measures so all aggressive measures not benefiting the patient's comfort were discontinued.    Pyuria with Bacteruria -U/A with many bacteria, wbc TNTC, RBC TNTC, denies back pain, denies dysuria ( per daughter) -Urine culture pending -Rocephin Discontinued by Infectious Diseases as they do not believe she has a UTI -Patient now Full Comfort Measures  AKI, worsened -Likley from Heart Failure and Vancomycin -BUN/Cr went from 24/1.32 -> 24/1.32 -> 47/1.64 -> 61/2.08 -IV Vancomycin changed to Teflaro but now D/C'd now that patient is Full Comfort.  -Will not Repeat CMP in AM now that patient is Comfort Measures  Thrombocytopenia -Acute on chronic, noted platelet started to  trend down since 03/2017 -From liver congestion?  From acute infection? Less likely MAHA, no mention of schistocytes, no recent heparin exposure ( has been on coumadin),  -will check fractionated bili -Monitor, consider stop coumadin if plt less than 50. Platelet Count was 569yesterday AM  -No acute bleeding -Will not Repeat CBC in AM now that patient is Full Comfort  Mild elevation of LFT, possibly from liver congestion -AST went from 83 -> 76 -> 104 -ALT went from 19 -> 18 -> 21 -Will not Repeat CMP in AM now that Patient is Full Comfort  Hyperkalemia -K+ went from 5.2 -> 4.2 -> 6.1 -Given Kayexalate yesterday AM  -Will not Continue to Monitor and Repeat CMP in AM now that patient is Comfort   Chronic Afib, s/p pacemaker,  -Currently paced rhythm -Not on rate control agent at home but on Anticoagulation with Coumadin; Coumadin was now stopped -PT-INR was 30.8-2.88 -Will not Repeat PT INR in AM now that patient is Comfort Measures   Chronic leg wounds -Suspect possibly the cause of the bacteremia -Wound care consulted and appreciated Recc's -Patient is now Comfort  FTT -Patient is now Comfort Measures and likely will not survive hospitalization   Mildly Elevated Troponin -Likely demand ischemia in the setting of Atrial Fibrillation and Infection -Cardiology Consulted for further evaluation and Recommendations; Cardiology has nothing else to offer -Patient is now Comfort Measures  DVT prophylaxis: None now that patient is Comfort Measures Code Status: DO NOT RESUSCITATE Family Communication: Discussed with Daughter and Sonat  bedside Disposition Plan: Remain on 6N as In Hospital Death is expected.  Consultants:   Infectious Diseases  Cardiology  Electrophysiology  Palliative Care Medicine   Procedures:  ECHOCARDIOGRAM Study Conclusions  - Left ventricle: The cavity size was normal. Wall thickness was   normal. Systolic function was normal. The estimated  ejection   fraction was in the range of 55% to 60%. Wall motion was normal;   there were no regional wall motion abnormalities. Features are   consistent with a pseudonormal left ventricular filling pattern,   with concomitant abnormal relaxation and increased filling   pressure (grade 2 diastolic dysfunction). - Ventricular septum: D-shaped interventricular septum suggestive   of RV pressure/volume overload. - Aortic valve: Trileaflet; moderately calcified leaflets. There   was mild stenosis. There was trivial regurgitation. Mean gradient   (S): 9 mm Hg. - Mitral valve: Severely calcified annulus. Moderately calcified   leaflets. There is a possible vegetation (small) noted near the   tip of the posterior leaflet. There was moderate to severe   regurgitation. - Left atrium: The atrium was severely dilated. - Right ventricle: The cavity size was moderately dilated. Pacer   wire or catheter noted in right ventricle. Systolic function was   moderately reduced. - Right atrium: The atrium was severely dilated. In one view (image   51), a small mobile mass occasionally flickers into view in the   right atrium, ?vegetation on pacemaker lead. - Tricuspid valve: There was moderate regurgitation. Peak RV-RA   gradient (S): 51 mm Hg. - Pulmonary arteries: PA peak pressure: 59 mm Hg (S). - Systemic veins: IVC measured 2.1 cm with < 50% respirophasic   variation, suggesting RA pressure 8 mmHg. - Pericardium, extracardiac: A trivial pericardial effusion was   identified.  Impressions:  - Normal LV size with EF 55-60%. Moderate diastolic dysfunction.   Moderately dilated RV with moderately decreased systolic   function. D-shaped interventricular septum suggestive of RV   pressure/volume overload. Mild aortic stenosis. Heavily calcified   mitral valve and annulus with moderate to severe mitral   regurgitation. I am concerned that there may be a small   vegetation at the tip of the posterior  leaflet. Moderate   pulmonary hypertension. Severe biatrial enlargement. Moderate TR.   There is a mobile mass noted flickering in and out of view in one   image of the right atrium. Possible vegetation on pacemaker lead.   This patient needs a TEE.   Antimicrobials: Anti-infectives    Start     Dose/Rate Route Frequency Ordered Stop   06/14/17 1000  vancomycin (VANCOCIN) 500 mg in sodium chloride 0.9 % 100 mL IVPB  Status:  Discontinued     500 mg 100 mL/hr over 60 Minutes Intravenous Every 48 hours 06/12/17 0940 06/14/17 0845   06/14/17 1000  ceftaroline (TEFLARO) injection SOLR 400 mg  Status:  Discontinued     400 mg Intravenous Every 12 hours 06/14/17 0845 06/14/17 0849   06/14/17 1000  ceftaroline (TEFLARO) 200 mg in sodium chloride 0.9 % 250 mL IVPB  Status:  Discontinued     200 mg 250 mL/hr over 60 Minutes Intravenous Every 12 hours 06/14/17 0849 06/14/17 1643   06/12/17 1000  vancomycin (VANCOCIN) IVPB 750 mg/150 ml premix     750 mg 150 mL/hr over 60 Minutes Intravenous  Once 06/12/17 0940 06/12/17 1100   06/08/2017 2145  cefTRIAXone (ROCEPHIN) 1 g in dextrose 5 % 50 mL IVPB  Status:  Discontinued  1 g 100 mL/hr over 30 Minutes Intravenous Every 24 hours 05/26/2017 2136 06/12/17 1039   06/08/2017 1545  cefTRIAXone (ROCEPHIN) 1 g in dextrose 5 % 50 mL IVPB     1 g 100 mL/hr over 30 Minutes Intravenous  Once 05/29/2017 1543 05/19/2017 1718     Subjective: Patient was seen and examined at bedside family stated she woke up last night and had some pain that was relieved with morphine. Resting comfortably this AM. No Acute Distress.  Objective: Vitals:   06/14/17 1944 06/14/17 2337 06/15/17 0000 06/15/17 0300  BP: (!) 100/55     Pulse: 77  73 (!) 26  Resp: _0 Temp: 97.7 F (36.5 C) 98.3 F (36.8 C)    TempSrc: Axillary Axillary    SpO2: 98% 98% 92% 100%  Weight:      Height:        Intake/Output Summary (Last 24 hours) at 06/16/17 1206 Last data filed at 06/16/17  0439  Gross per 24 hour  Intake                0 ml  Output                0 ml  Net                0 ml   Filed Weights   06/05/2017 1331 06/14/17 0600  Weight: 40.8 kg (90 lb) 42 kg (92 lb 9.5 oz)   Examination: Physical Exam:  Constitutional: Thin cachectic elderly female actively dying; Currently resting comfortably and in NAD Eyes: Lids Normal ENMT: External ears and nose appear normal Neck: Supple with No JVD Respiratory: Slightl increased respirations. Diminished breath sounds Cardiovascular: Irregularly Irregular. No Lower Extremity Edema Abdomen: Soft, NT, ND. GU: Deferred Musculoskeletal: No contractures Skin: Had multiple skin tears covered by Mepilex Pad; Legs wrapped bilaterally Neurologic: No appreciable deficits Psychiatric: Not awake or alert. Resting comfortably  Data Reviewed: I have personally reviewed following labs and imaging studies  CBC:  Recent Labs Lab 05/14/2017 1612 06/12/17 0314 06/13/17 0516 06/14/17 0644  WBC 10.1 11.7* 9.7 10.6*  NEUTROABS 9.7*  --  8.7* 9.3*  HGB 13.2 13.4 12.5 12.3  HCT 40.2 40.4 36.8 36.7  MCV 109.5* 108.9* 106.1* 105.2*  PLT 59* 50* 55* 54*   Basic Metabolic Panel:  Recent Labs Lab 05/13/2017 1612 06/12/17 0314 06/13/17 0516 06/14/17 0644  NA 139 137 137 135  K 5.2* 5.0 4.2 6.1*  CL 104 105 106 107  CO2 23 21* 23 14*  GLUCOSE 89 68 86 76  BUN 24* 28* 47* 61*  CREATININE 1.32* 1.36* 1.64* 2.08*  CALCIUM 9.1 8.4* 7.8* 7.7*  MG  --   --  2.0 1.9  PHOS  --   --  4.2 4.3   GFR: Estimated Creatinine Clearance: 9.6 mL/min (A) (by C-G formula based on SCr of 2.08 mg/dL (H)). Liver Function Tests:  Recent Labs Lab 05/21/2017 1612 06/12/17 0314 06/13/17 0516 06/14/17 0644  AST 82* 83* 76* 104*  ALT _1 ALKPHOS 75 66 57 56  BILITOT 1.6* 1.5* 1.6* 2.5*  PROT 6.2* 5.9* 5.2* 5.0*  ALBUMIN 3.3* 2.9* 2.6* 2.6*   No results for input(s): LIPASE, AMYLASE in the last 168 hours. No results for input(s):  AMMONIA in the last 168 hours. Coagulation Profile:  Recent Labs Lab 06/12/17 0125 06/12/17 0314 06/13/17 0516 06/14/17 0644  INR 3.44 3.32 2.88 2.69  Cardiac Enzymes: No results for input(s): CKTOTAL, CKMB, CKMBINDEX, TROPONINI in the last 168 hours. BNP (last 3 results) No results for input(s): PROBNP in the last 8760 hours. HbA1C: No results for input(s): HGBA1C in the last 72 hours. CBG: No results for input(s): GLUCAP in the last 168 hours. Lipid Profile: No results for input(s): CHOL, HDL, LDLCALC, TRIG, CHOLHDL, LDLDIRECT in the last 72 hours. Thyroid Function Tests: No results for input(s): TSH, T4TOTAL, FREET4, T3FREE, THYROIDAB in the last 72 hours. Anemia Panel: No results for input(s): VITAMINB12, FOLATE, FERRITIN, TIBC, IRON, RETICCTPCT in the last 72 hours. Sepsis Labs:  Recent Labs Lab 05/26/2017 1641 06/12/17 0931 06/12/17 1058  LATICACIDVEN 4.02* 1.8 1.8    Recent Results (from the past 240 hour(s))  Urine culture     Status: Abnormal   Collection Time: 05/22/2017  2:37 PM  Result Value Ref Range Status   Specimen Description URINE, RANDOM  Final   Special Requests NONE  Final   Culture >=100,000 COLONIES/mL ESCHERICHIA COLI (A)  Final   Report Status 06/14/2017 FINAL  Final   Organism ID, Bacteria ESCHERICHIA COLI (A)  Final      Susceptibility   Escherichia coli - MIC*    AMPICILLIN <=2 SENSITIVE Sensitive     CEFAZOLIN <=4 SENSITIVE Sensitive     CEFTRIAXONE <=1 SENSITIVE Sensitive     CIPROFLOXACIN <=0.25 SENSITIVE Sensitive     GENTAMICIN <=1 SENSITIVE Sensitive     IMIPENEM <=0.25 SENSITIVE Sensitive     NITROFURANTOIN <=16 SENSITIVE Sensitive     TRIMETH/SULFA <=20 SENSITIVE Sensitive     AMPICILLIN/SULBACTAM <=2 SENSITIVE Sensitive     PIP/TAZO <=4 SENSITIVE Sensitive     Extended ESBL NEGATIVE Sensitive     * >=100,000 COLONIES/mL ESCHERICHIA COLI  Culture, blood (routine x 2)     Status: Abnormal   Collection Time: 06/06/2017  4:01 PM   Result Value Ref Range Status   Specimen Description BLOOD RIGHT WRIST  Final   Special Requests IN PEDIATRIC BOTTLE Blood Culture adequate volume  Final   Culture  Setup Time   Final    GRAM POSITIVE COCCI IN CLUSTERS AEROBIC BOTTLE ONLY CRITICAL VALUE NOTED.  VALUE IS CONSISTENT WITH PREVIOUSLY REPORTED AND CALLED VALUE.    Culture (A)  Final    STAPHYLOCOCCUS AUREUS SUSCEPTIBILITIES PERFORMED ON PREVIOUS CULTURE WITHIN THE LAST 5 DAYS.    Report Status 06/14/2017 FINAL  Final  Culture, blood (routine x 2)     Status: Abnormal   Collection Time: 05/18/2017  4:12 PM  Result Value Ref Range Status   Specimen Description BLOOD RIGHT ANTECUBITAL  Final   Special Requests   Final    BOTTLES DRAWN AEROBIC AND ANAEROBIC Blood Culture adequate volume   Culture  Setup Time   Final    GRAM POSITIVE COCCI IN CLUSTERS IN BOTH AEROBIC AND ANAEROBIC BOTTLES CRITICAL RESULT CALLED TO, READ BACK BY AND VERIFIED WITH: [name] Pharm.D. 10:15 06/12/17 ([email or web address])    Culture METHICILLIN RESISTANT STAPHYLOCOCCUS AUREUS (A)  Final   Report Status 06/14/2017 FINAL  Final   Organism ID, Bacteria METHICILLIN RESISTANT STAPHYLOCOCCUS AUREUS  Final      Susceptibility   Methicillin resistant staphylococcus aureus - MIC*    CIPROFLOXACIN >=8 RESISTANT Resistant     ERYTHROMYCIN >=8 RESISTANT Resistant     GENTAMICIN <=0.5 SENSITIVE Sensitive     OXACILLIN >=4 RESISTANT Resistant     TETRACYCLINE <=1 SENSITIVE Sensitive     VANCOMYCIN 1 SENSITIVE Sensitive  TRIMETH/SULFA >=320 RESISTANT Resistant     CLINDAMYCIN <=0.25 SENSITIVE Sensitive     RIFAMPIN <=0.5 SENSITIVE Sensitive     Inducible Clindamycin NEGATIVE Sensitive     * METHICILLIN RESISTANT STAPHYLOCOCCUS AUREUS  Blood Culture ID Panel (Reflexed)     Status: Abnormal   Collection Time: 05/24/2017  4:12 PM  Result Value Ref Range Status   Enterococcus species NOT DETECTED NOT DETECTED Final   Listeria monocytogenes NOT DETECTED NOT DETECTED  Final   Staphylococcus species DETECTED (A) NOT DETECTED Final    Comment: CRITICAL RESULT CALLED TO, READ BACK BY AND VERIFIED WITH: MVonita Moss.D. 10:15 06/12/17 ([email or web address])    Staphylococcus aureus DETECTED (A) NOT DETECTED Final    Comment: Methicillin (oxacillin)-resistant Staphylococcus aureus (MRSA). MRSA is predictably resistant to beta-lactam antibiotics (except ceftaroline). Preferred therapy is vancomycin unless clinically contraindicated. Patient requires contact precautions if  hospitalized. CRITICAL RESULT CALLED TO, READ BACK BY AND VERIFIED WITH: M. Radford Pax Pharm.D. 10:15 06/12/17 ([email or web address])    Methicillin resistance DETECTED (A) NOT DETECTED Final    Comment: CRITICAL RESULT CALLED TO, READ BACK BY AND VERIFIED WITH: M. Radford Pax Pharm.D. 10:15 06/12/17 ([email or web address])    Streptococcus species NOT DETECTED NOT DETECTED Final   Streptococcus agalactiae NOT DETECTED NOT DETECTED Final   Streptococcus pneumoniae NOT DETECTED NOT DETECTED Final   Streptococcus pyogenes NOT DETECTED NOT DETECTED Final   Acinetobacter baumannii NOT DETECTED NOT DETECTED Final   Enterobacteriaceae species NOT DETECTED NOT DETECTED Final   Enterobacter cloacae complex NOT DETECTED NOT DETECTED Final   Escherichia coli NOT DETECTED NOT DETECTED Final   Klebsiella oxytoca NOT DETECTED NOT DETECTED Final   Klebsiella pneumoniae NOT DETECTED NOT DETECTED Final   Proteus species NOT DETECTED NOT DETECTED Final   Serratia marcescens NOT DETECTED NOT DETECTED Final   Haemophilus influenzae NOT DETECTED NOT DETECTED Final   Neisseria meningitidis NOT DETECTED NOT DETECTED Final   Pseudomonas aeruginosa NOT DETECTED NOT DETECTED Final   Candida albicans NOT DETECTED NOT DETECTED Final   Candida glabrata NOT DETECTED NOT DETECTED Final   Candida krusei NOT DETECTED NOT DETECTED Final   Candida parapsilosis NOT DETECTED NOT DETECTED Final   Candida tropicalis NOT DETECTED NOT DETECTED Final  MRSA PCR Screening      Status: None   Collection Time: 06/12/17  4:36 AM  Result Value Ref Range Status   MRSA by PCR NEGATIVE NEGATIVE Final    Comment:        The GeneXpert MRSA Assay (FDA approved for NASAL specimens only), is one component of a comprehensive MRSA colonization surveillance program. It is not intended to diagnose MRSA infection nor to guide or monitor treatment for MRSA infections.   Culture, blood (Routine X 2) w Reflex to ID Panel     Status: None (Preliminary result)   Collection Time: 06/12/17 10:50 AM  Result Value Ref Range Status   Specimen Description BLOOD LEFT ANTECUBITAL  Final   Special Requests   Final    BOTTLES DRAWN AEROBIC ONLY Blood Culture adequate volume   Culture NO GROWTH 3 DAYS  Final   Report Status PENDING  Incomplete  Culture, blood (Routine X 2) w Reflex to ID Panel     Status: None (Preliminary result)   Collection Time: 06/12/17 10:55 AM  Result Value Ref Range Status   Specimen Description BLOOD LEFT HAND  Final   Special Requests   Final    BOTTLES DRAWN AEROBIC ONLY Blood Culture results may not be optimal  due to an inadequate volume of blood received in culture bottles   Culture NO GROWTH 3 DAYS  Final   Report Status PENDING  Incomplete  Culture, blood (Routine X 2) w Reflex to ID Panel     Status: None (Preliminary result)   Collection Time: 06/13/17 10:05 AM  Result Value Ref Range Status   Specimen Description BLOOD LEFT HAND  Final   Special Requests IN PEDIATRIC BOTTLE Blood Culture adequate volume  Final   Culture NO GROWTH 2 DAYS  Final   Report Status PENDING  Incomplete  Culture, blood (Routine X 2) w Reflex to ID Panel     Status: None (Preliminary result)   Collection Time: 06/13/17 10:05 AM  Result Value Ref Range Status   Specimen Description BLOOD RIGHT HAND  Final   Special Requests IN PEDIATRIC BOTTLE Blood Culture adequate volume  Final   Culture NO GROWTH 2 DAYS  Final   Report Status PENDING  Incomplete    Radiology  Studies: No results found. Scheduled Meds: . lidocaine  1 patch Transdermal Q24H  . mouth rinse  15 mL Mouth Rinse BID  . traMADol-acetaminophen  1 tablet Oral Q8H   Continuous Infusions:   LOS: 5 days   Kerney Elbe, DO [facility] Pager [phone]  If 7PM-7AM, please contact night-coverage [email or web address] Password [email or web address] 06/16/2017, 12:06 PM

## 2017-06-16 NOTE — Care Management Note (Signed)
Case Management Note  Patient Details  Name: [name] MRN: [id number] Date of Birth: 06-Mar-1919  Subjective/Objective:  Pt was seen by Palliative Care NP and transitioned to Full Comfort Care as Hospital Death is anticipated.                   Action/Plan:CM will sign off for now but will be available should additional discharge needs arise or disposition change.    Expected Discharge Date:  06/13/17               Expected Discharge Plan:     In-House Referral:     Discharge planning Services  CM Consult  Post Acute Care Choice:  NA Choice offered to:     DME Arranged:    DME Agency:     HH Arranged:    HH Agency:     Status of Service:  In process, will continue to follow  If discussed at Long Length of Stay Meetings, dates discussed:    Additional Comments:  Delrae Sawyers, RN 06/16/2017, 8:46 AM

## 2017-06-16 NOTE — Progress Notes (Signed)
Daily Progress Note   Patient Name: [name]       Date: 06/16/2017 DOB: 02-16-1919  Age: 83 y.o. MRN#: [id number] Attending Physician: Kerney Elbe, DO Primary Care Physician: Golden Circle, FNP Admit Date: 06/07/2017  Reason for Consultation/Follow-up: Terminal Care  Subjective: Patient actively dying. Family at bedside. Reports patient comfortable after pm dose of morphine last night. They have noted increase in gurgling.  ROS  Length of Stay: 5  Current Medications: Scheduled Meds:  . lidocaine  1 patch Transdermal Q24H  . mouth rinse  15 mL Mouth Rinse BID  . traMADol-acetaminophen  1 tablet Oral Q8H    Continuous Infusions:   PRN Meds: acetaminophen, acetaminophen, atropine, haloperidol **OR** haloperidol, loperamide, LORazepam **OR** LORazepam, morphine CONCENTRATE, traMADol  Physical Exam  Constitutional:  Frail, cachetic  Cardiovascular:  Thready pulses, tachycardic  Pulmonary/Chest:  Apneic at times, audible terminal secretions  Abdominal: Soft.  Neurological:  nonresponsive to verbal stimuli or touch  Skin: There is pallor.  Purple extremities            Vital Signs: BP (!) 100/55 (BP Location: Right Arm)   Pulse (!) 26   Temp 98.3 F (36.8 C) (Axillary)   Resp 12   Ht 4\' 9"  (1.448 m)   Wt 42 kg (92 lb 9.5 oz)   LMP  (LMP Unknown)   SpO2 100%   BMI 20.04 kg/m  SpO2: SpO2: 100 % O2 Device: O2 Device: Nasal Cannula O2 Flow Rate: O2 Flow Rate (L/min): 5 L/min  Intake/output summary:  Intake/Output Summary (Last 24 hours) at 06/16/17 0935 Last data filed at 06/16/17 0439  Gross per 24 hour  Intake                0 ml  Output                0 ml  Net                0 ml   LBM: Last BM Date: 06/14/17 Baseline Weight: Weight: 40.8  kg (90 lb) Most recent weight: Weight: 42 kg (92 lb 9.5 oz)       Palliative Assessment/Data: PPS: 10%    Flowsheet Rows     Most Recent Value  Intake Tab  Referral Department  Hospitalist  Unit at Time of Referral  Intermediate Care Unit  Palliative Care Primary Diagnosis  Sepsis/Infectious Disease  Date Notified  06/12/17  Palliative Care Type  New Palliative care  Reason for referral  Clarify Goals of Care  Date of Admission  06/01/2017  Date first seen by Palliative Care  06/13/17  # of days Palliative referral response time  1 Day(s)  # of days IP prior to Palliative referral  1  Clinical Assessment  Palliative Performance Scale Score  20%  Psychosocial & Spiritual Assessment  Palliative Care Outcomes      Patient Active Problem List   Diagnosis Date Noted  . Advance care planning   . Terminal care   . DNR (do not resuscitate)   . Palliative care by specialist   . Altered mental status   . Wound abscess   . Endocarditis of native valve   . Pacemaker infection ([place]) 06/12/2017  . MRSA bacteremia 06/12/2017  . Pyuria 06/12/2017  . AKI (acute kidney injury) ([place]) 06/12/2017  . Thrombocytopenia ([place]) 06/12/2017  . Acute diastolic CHF (congestive heart failure) ([place])   . Hypoxia 05/25/2017  . Acute hypoxemic respiratory failure ([place]) 05/18/2017  . Decreased hearing 04/24/2017  . Bacteriuria with pyuria 04/24/2017  . Community acquired pneumonia 04/24/2017  . Fall 04/24/2017  . Pill dysphagia 08/15/2016  . Globus sensation 08/01/2016  . Difficulty swallowing pills 05/30/2016  . Bilateral shoulder pain 05/30/2016  . Dizziness 05/30/2016  . Rib pain on left side 11/03/2015  . Wound of skin 10/07/2015  . Hx of completed stroke 10/05/2015  . Traumatic hematoma 09/30/2015  . Permanent atrial fibrillation ([place]) 09/30/2015  . Leg hematoma 09/30/2015  . Cough   . Extrarenal azotemia 12/28/2014  . End of life care 01/29/2014  . Hyponatremia 10/25/2013  . Fracture  of multiple pubic rami ([place]) 10/23/2013  . GERD (gastroesophageal reflux disease) 12/29/2012  . Long term current use of anticoagulant 10/29/2011  . Stroke ([place]) 10/23/2011  . Pacemaker  single-chamber-St. Jude's 04/17/2011  . DIASTOLIC HEART FAILURE, CHRONIC 01/08/2011  . Atrial fibrillation ([place]) 12/26/2010  . ANEMIA 12/14/2010  . CAROTID ARTERY DISEASE 07/27/2010  . Chronic pain syndrome 01/23/2010  . Coronary atherosclerosis 01/15/2009  . ATRIAL SEPTAL DEFECT 01/15/2009  . AV BLOCK, COMPLETE 11/03/2008  . DIVERTICULOSIS OF COLON 07/17/2008  . OVERACTIVE BLADDER 07/17/2008  . DEGENERATIVE JOINT DISEASE 07/15/2008  . Osteoporosis 07/15/2008  . HYPERCHOLESTEROLEMIA 09/04/2007  . VENOUS INSUFFICIENCY 09/04/2007  . RESPIRATORY DISORDER, CHRONIC 09/04/2007  . BACK PAIN, LUMBAR 09/04/2007    Palliative Care Assessment & Plan   Patient Profile: 83 y.o.femalewith history of HTN, CAD, h/o ASD repaired, permanent AF s/p AVN ablation (x2) w/PPM, on chronic coumadin, h/o diastolic chf, h/ovenous insufficiency , chronic leg wound.She was admitted on 06/04/2017 with complaints of sob, weakness and confusion. Workup revealed MRSA bacteremia- presumed from infected pacemaker. Patient has continued to decompensate during this admission. IV access has become difficult and family has made decision to transition to comfort measures.  Assessment/Recommendations/Plan   Actively dying  Terminal secretions: Atropine- 4 drops SL for audible gurgling  Continue other comfort measures as ordered  Goals of Care and Additional Recommendations:  Limitations on Scope of Treatment: Full Comfort Care  Code Status:  DNR  Prognosis:   Hours - Days  Discharge Planning:  Anticipated Hospital Death  Care plan was discussed with patient's children.  Thank you for allowing the Palliative Medicine Team to assist in the care of this patient.  Total time: 25 minutes  Greater than 50%  of this  time was spent counseling and coordinating care related to the above assessment and plan.  Mariana Kaufman, AGNP-C Palliative Medicine   Please contact Palliative Medicine Team phone at [phone] for questions and concerns.

## 2017-06-17 ENCOUNTER — Telehealth: Payer: Self-pay

## 2017-06-17 LAB — CULTURE, BLOOD (ROUTINE X 2)
Culture: NO GROWTH
Culture: NO GROWTH
SPECIAL REQUESTS: ADEQUATE

## 2017-06-18 ENCOUNTER — Ambulatory Visit: Payer: Medicare Other | Admitting: Physician Assistant

## 2017-06-18 LAB — CULTURE, BLOOD (ROUTINE X 2)
CULTURE: NO GROWTH
Culture: NO GROWTH
SPECIAL REQUESTS: ADEQUATE
Special Requests: ADEQUATE

## 2017-07-13 NOTE — Progress Notes (Signed)
Nutrition Brief Note  Chart reviewed. Pt now transitioning to comfort care.  No further nutrition interventions warranted at this time.  Please re-consult as needed.   Rosita Guzzetta A. Viki Carrera, RD, LDN, CDE Pager: [phone] After hours Pager: [phone]  

## 2017-07-13 NOTE — Death Summary Note (Signed)
DEATH SUMMARY   Patient Details  Name: [name] MRN: [id number] DOB: February 08, 1919  Admission/Discharge Information   Admit Date:  06/29/2017  Date of Death: Date of Death: 23-Jun-2017  Time of Death: Time of Death: 0915  Length of Stay: Feb 12, 81  Referring Physician: Golden Circle, FNP   Reason(s) for Hospitalization  Increased Confusion, Feeling Bad and Shaking all over  Diagnoses  Preliminary cause of death:    Acute Cardiopulmonary Arrest from MRSA Bacteremia with Infected Pacemaker and Endocarditis  Secondary Diagnoses (including complications and co-morbidities):   Acute hypoxic respiratory failure Suspect from acute on chronic diastolic chf exacerbation and possible ?PNA  Confusion, likely Metabolic encephalopathy from Infectious Etiology  Pyuria with Bacteruria  AKI, worsened  Thrombocytopenia  Mild elevation of LFT, possibly from liver congestion  Hyperkalemia  Chronic Afib, s/p pacemaker  Chronic leg wounds  FTT  Mildly Elevated Troponin  Principal Problem:   Pacemaker infection ([place]) Active Problems:   HYPERCHOLESTEROLEMIA   Chronic pain syndrome   Coronary atherosclerosis   BACK PAIN, LUMBAR   Atrial fibrillation (HCC)   Permanent atrial fibrillation (HCC)   Bacteriuria with pyuria   Hypoxia   Acute hypoxemic respiratory failure (HCC)   Acute diastolic CHF (congestive heart failure) (HCC)   MRSA bacteremia   Pyuria   AKI (acute kidney injury) ([place])   Thrombocytopenia (HCC)   DNR (do not resuscitate)   Palliative care by specialist   Altered mental status   Wound abscess   Endocarditis of native valve   Advance care planning   Terminal care   Brief Hospital Course (including significant findings, care, treatment, and services provided and events leading to death)  [name] is a 83 y.o. year old female with history of HTN, CAD, h/o ASD repair, permanent AF s/p AVN ablation (x2) w/PPM, on chronic coumadin, h/o Diastolic  CHF, h/ovenous insufficiency, chronic leg wound presented to the ED due to feeling bad, shaking all over, and increased confusion.   Daughter reports patient was recently treated for community acquired pneumonia, then got fluids overload. Her Cardiologist recently increased her lasix to 45m daily a month ago. Per chart review, she was just seen by cardiology on 7/24 with "OK"volume status and renal function.Daughter report patient weight still up about 1.5lbs (84.5lbs) from target weight (83lbs)  She is brought to the ED due to sob, weakness and confusion. She was found to have a MRSA bacteremia, pacemaker infection, and Endocarditis. Infectious Diseases, Cardiology, Electrophysiology, and Palliative Care Medicine were consulted for further evaluation and recommendations. Palliative Care met with the family and patient's code status was changed to DNR. Her Cr increased so she was given a small Fluid Bolus. Cardiology signed off as they have nothing else to offer. ID changed Abx to Teflaro as patient's Cr continued to Worsen.  Patient had multiple skin tears from attempted blood sticks and removed her IV Access. After discussion with Palliative Care Medicine, family elected to pursue full comfort measures so all aggressive measures not benefiting the patient's comfort were discontinued. Patient was transferred out of SDU to 6N and pain medication was adjusted by Palliative Care medicine. Patient was a high risk for decompensation and this AM nursing when to check on the patient and patient was unresponsive wiith no spontaneous respirations or pulse. Patient passed away on 0915 on 0Aug 12, 2018and family was given condolences and they are making funeral arrangements.   Pertinent Labs and Studies  Significant Diagnostic Studies Dg Chest 2 View  Result  Date: 05/16/2017 CLINICAL DATA:  Shortness of breath . EXAM: CHEST  2 VIEW COMPARISON:  04/18/2017. FINDINGS: Cardiac pacer with lead tips in right atrium  right ventricle. Stable cardiomegaly. Interval clearing of right base infiltrate. Persistent low lung volumes and bilateral pleural effusions. No pneumothorax . IMPRESSION: 1. Interim clearing of right base infiltrate. 2. Persistent bibasilar atelectasis and small pleural effusions. 3. Cardiac pacer stable position.  Stable cardiomegaly. Electronically Signed   By: Marcello Moores  Register   On: 05/19/2017 14:58   Dg Tibia/fibula Right Port  Result Date: 06/12/2017 CLINICAL DATA:  Wound abscess. EXAM: PORTABLE RIGHT TIBIA AND FIBULA - 2 VIEW COMPARISON:  09/30/2015 FINDINGS: The bones are diffusely osteopenic. No fracture is identified. The knee and ankle are located. No osseous erosion is seen to suggest active osteomyelitis. Diffuse vascular calcifications are noted. No soft tissue gas is seen. IMPRESSION: No evidence of active osteomyelitis or fracture. Electronically Signed   By: Logan Bores M.D.   On: 06/12/2017 20:36   Consultants:   Infectious Diseases  Cardiology  Electrophysiology  Palliative Care Medicine   Microbiology Recent Results (from the past 240 hour(s))  Urine culture     Status: Abnormal   Collection Time: 05/27/2017  2:37 PM  Result Value Ref Range Status   Specimen Description URINE, RANDOM  Final   Special Requests NONE  Final   Culture >=100,000 COLONIES/mL ESCHERICHIA COLI (A)  Final   Report Status 06/14/2017 FINAL  Final   Organism ID, Bacteria ESCHERICHIA COLI (A)  Final      Susceptibility   Escherichia coli - MIC*    AMPICILLIN <=2 SENSITIVE Sensitive     CEFAZOLIN <=4 SENSITIVE Sensitive     CEFTRIAXONE <=1 SENSITIVE Sensitive     CIPROFLOXACIN <=0.25 SENSITIVE Sensitive     GENTAMICIN <=1 SENSITIVE Sensitive     IMIPENEM <=0.25 SENSITIVE Sensitive     NITROFURANTOIN <=16 SENSITIVE Sensitive     TRIMETH/SULFA <=20 SENSITIVE Sensitive     AMPICILLIN/SULBACTAM <=2 SENSITIVE Sensitive     PIP/TAZO <=4 SENSITIVE Sensitive     Extended ESBL NEGATIVE Sensitive      * >=100,000 COLONIES/mL ESCHERICHIA COLI  Culture, blood (routine x 2)     Status: Abnormal   Collection Time: 05/21/2017  4:01 PM  Result Value Ref Range Status   Specimen Description BLOOD RIGHT WRIST  Final   Special Requests IN PEDIATRIC BOTTLE Blood Culture adequate volume  Final   Culture  Setup Time   Final    GRAM POSITIVE COCCI IN CLUSTERS AEROBIC BOTTLE ONLY CRITICAL VALUE NOTED.  VALUE IS CONSISTENT WITH PREVIOUSLY REPORTED AND CALLED VALUE.    Culture (A)  Final    STAPHYLOCOCCUS AUREUS SUSCEPTIBILITIES PERFORMED ON PREVIOUS CULTURE WITHIN THE LAST 5 DAYS.    Report Status 06/14/2017 FINAL  Final  Culture, blood (routine x 2)     Status: Abnormal   Collection Time: 05/22/2017  4:12 PM  Result Value Ref Range Status   Specimen Description BLOOD RIGHT ANTECUBITAL  Final   Special Requests   Final    BOTTLES DRAWN AEROBIC AND ANAEROBIC Blood Culture adequate volume   Culture  Setup Time   Final    GRAM POSITIVE COCCI IN CLUSTERS IN BOTH AEROBIC AND ANAEROBIC BOTTLES CRITICAL RESULT CALLED TO, READ BACK BY AND VERIFIED WITH: Leonie Green Pharm.D. 10:15 06/12/17 ([email or web address])    Culture METHICILLIN RESISTANT STAPHYLOCOCCUS AUREUS (A)  Final   Report Status 06/14/2017 FINAL  Final   Organism ID, Bacteria METHICILLIN  RESISTANT STAPHYLOCOCCUS AUREUS  Final      Susceptibility   Methicillin resistant staphylococcus aureus - MIC*    CIPROFLOXACIN >=8 RESISTANT Resistant     ERYTHROMYCIN >=8 RESISTANT Resistant     GENTAMICIN <=0.5 SENSITIVE Sensitive     OXACILLIN >=4 RESISTANT Resistant     TETRACYCLINE <=1 SENSITIVE Sensitive     VANCOMYCIN 1 SENSITIVE Sensitive     TRIMETH/SULFA >=320 RESISTANT Resistant     CLINDAMYCIN <=0.25 SENSITIVE Sensitive     RIFAMPIN <=0.5 SENSITIVE Sensitive     Inducible Clindamycin NEGATIVE Sensitive     * METHICILLIN RESISTANT STAPHYLOCOCCUS AUREUS  Blood Culture ID Panel (Reflexed)     Status: Abnormal   Collection Time: 05/12/2017  4:12 PM   Result Value Ref Range Status   Enterococcus species NOT DETECTED NOT DETECTED Final   Listeria monocytogenes NOT DETECTED NOT DETECTED Final   Staphylococcus species DETECTED (A) NOT DETECTED Final    Comment: CRITICAL RESULT CALLED TO, READ BACK BY AND VERIFIED WITH: MVonita Moss.D. 10:15 06/12/17 ([email or web address])    Staphylococcus aureus DETECTED (A) NOT DETECTED Final    Comment: Methicillin (oxacillin)-resistant Staphylococcus aureus (MRSA). MRSA is predictably resistant to beta-lactam antibiotics (except ceftaroline). Preferred therapy is vancomycin unless clinically contraindicated. Patient requires contact precautions if  hospitalized. CRITICAL RESULT CALLED TO, READ BACK BY AND VERIFIED WITH: M. Radford Pax Pharm.D. 10:15 06/12/17 ([email or web address])    Methicillin resistance DETECTED (A) NOT DETECTED Final    Comment: CRITICAL RESULT CALLED TO, READ BACK BY AND VERIFIED WITH: M. Radford Pax Pharm.D. 10:15 06/12/17 ([email or web address])    Streptococcus species NOT DETECTED NOT DETECTED Final   Streptococcus agalactiae NOT DETECTED NOT DETECTED Final   Streptococcus pneumoniae NOT DETECTED NOT DETECTED Final   Streptococcus pyogenes NOT DETECTED NOT DETECTED Final   Acinetobacter baumannii NOT DETECTED NOT DETECTED Final   Enterobacteriaceae species NOT DETECTED NOT DETECTED Final   Enterobacter cloacae complex NOT DETECTED NOT DETECTED Final   Escherichia coli NOT DETECTED NOT DETECTED Final   Klebsiella oxytoca NOT DETECTED NOT DETECTED Final   Klebsiella pneumoniae NOT DETECTED NOT DETECTED Final   Proteus species NOT DETECTED NOT DETECTED Final   Serratia marcescens NOT DETECTED NOT DETECTED Final   Haemophilus influenzae NOT DETECTED NOT DETECTED Final   Neisseria meningitidis NOT DETECTED NOT DETECTED Final   Pseudomonas aeruginosa NOT DETECTED NOT DETECTED Final   Candida albicans NOT DETECTED NOT DETECTED Final   Candida glabrata NOT DETECTED NOT DETECTED Final   Candida krusei NOT DETECTED NOT DETECTED  Final   Candida parapsilosis NOT DETECTED NOT DETECTED Final   Candida tropicalis NOT DETECTED NOT DETECTED Final  MRSA PCR Screening     Status: None   Collection Time: 06/12/17  4:36 AM  Result Value Ref Range Status   MRSA by PCR NEGATIVE NEGATIVE Final    Comment:        The GeneXpert MRSA Assay (FDA approved for NASAL specimens only), is one component of a comprehensive MRSA colonization surveillance program. It is not intended to diagnose MRSA infection nor to guide or monitor treatment for MRSA infections.   Culture, blood (Routine X 2) w Reflex to ID Panel     Status: None   Collection Time: 06/12/17 10:50 AM  Result Value Ref Range Status   Specimen Description BLOOD LEFT ANTECUBITAL  Final   Special Requests   Final    BOTTLES DRAWN AEROBIC ONLY Blood Culture adequate volume   Culture NO GROWTH 5 DAYS  Final  Report Status July 12, 2017 FINAL  Final  Culture, blood (Routine X 2) w Reflex to ID Panel     Status: None   Collection Time: 06/12/17 10:55 AM  Result Value Ref Range Status   Specimen Description BLOOD LEFT HAND  Final   Special Requests   Final    BOTTLES DRAWN AEROBIC ONLY Blood Culture results may not be optimal due to an inadequate volume of blood received in culture bottles   Culture NO GROWTH 5 DAYS  Final   Report Status 2017-07-12 FINAL  Final  Culture, blood (Routine X 2) w Reflex to ID Panel     Status: None (Preliminary result)   Collection Time: 06/13/17 10:05 AM  Result Value Ref Range Status   Specimen Description BLOOD LEFT HAND  Final   Special Requests IN PEDIATRIC BOTTLE Blood Culture adequate volume  Final   Culture NO GROWTH 4 DAYS  Final   Report Status PENDING  Incomplete  Culture, blood (Routine X 2) w Reflex to ID Panel     Status: None (Preliminary result)   Collection Time: 06/13/17 10:05 AM  Result Value Ref Range Status   Specimen Description BLOOD RIGHT HAND  Final   Special Requests IN PEDIATRIC BOTTLE Blood Culture adequate  volume  Final   Culture NO GROWTH 4 DAYS  Final   Report Status PENDING  Incomplete   Anti-infectives    Start     Dose/Rate Route Frequency Ordered Stop   06/14/17 1000  vancomycin (VANCOCIN) 500 mg in sodium chloride 0.9 % 100 mL IVPB  Status:  Discontinued     500 mg 100 mL/hr over 60 Minutes Intravenous Every 48 hours 06/12/17 0940 06/14/17 0845   06/14/17 1000  ceftaroline (TEFLARO) injection SOLR 400 mg  Status:  Discontinued     400 mg Intravenous Every 12 hours 06/14/17 0845 06/14/17 0849   06/14/17 1000  ceftaroline (TEFLARO) 200 mg in sodium chloride 0.9 % 250 mL IVPB  Status:  Discontinued     200 mg 250 mL/hr over 60 Minutes Intravenous Every 12 hours 06/14/17 0849 06/14/17 1643   06/12/17 1000  vancomycin (VANCOCIN) IVPB 750 mg/150 ml premix     750 mg 150 mL/hr over 60 Minutes Intravenous  Once 06/12/17 0940 06/12/17 1100   05/17/2017 2145  cefTRIAXone (ROCEPHIN) 1 g in dextrose 5 % 50 mL IVPB  Status:  Discontinued     1 g 100 mL/hr over 30 Minutes Intravenous Every 24 hours 06/09/2017 2136 06/12/17 1039   06/07/2017 1545  cefTRIAXone (ROCEPHIN) 1 g in dextrose 5 % 50 mL IVPB     1 g 100 mL/hr over 30 Minutes Intravenous  Once 05/26/2017 1543 05/23/2017 1718     Lab Basic Metabolic Panel:  Recent Labs Lab 05/31/2017 1612 06/12/17 0314 06/13/17 0516 06/14/17 0644  NA 139 137 137 135  K 5.2* 5.0 4.2 6.1*  CL 104 105 106 107  CO2 23 21* 23 14*  GLUCOSE 89 68 86 76  BUN 24* 28* 47* 61*  CREATININE 1.32* 1.36* 1.64* 2.08*  CALCIUM 9.1 8.4* 7.8* 7.7*  MG  --   --  2.0 1.9  PHOS  --   --  4.2 4.3   Liver Function Tests:  Recent Labs Lab 06/05/2017 1612 06/12/17 0314 06/13/17 0516 06/14/17 0644  AST 82* 83* 76* 104*  ALT '17 19 18 21  ' ALKPHOS 75 66 57 56  BILITOT 1.6* 1.5* 1.6* 2.5*  PROT 6.2* 5.9* 5.2* 5.0*  ALBUMIN  3.3* 2.9* 2.6* 2.6*   No results for input(s): LIPASE, AMYLASE in the last 168 hours. No results for input(s): AMMONIA in the last 168  hours. CBC:  Recent Labs Lab 05/29/2017 1612 06/12/17 0314 06/13/17 0516 06/14/17 0644  WBC 10.1 11.7* 9.7 10.6*  NEUTROABS 9.7*  --  8.7* 9.3*  HGB 13.2 13.4 12.5 12.3  HCT 40.2 40.4 36.8 36.7  MCV 109.5* 108.9* 106.1* 105.2*  PLT 59* 50* 55* 54*   Cardiac Enzymes: No results for input(s): CKTOTAL, CKMB, CKMBINDEX, TROPONINI in the last 168 hours. Sepsis Labs:  Recent Labs Lab 06/04/2017 1612 05/14/2017 1641 06/12/17 0314 06/12/17 0931 06/12/17 1058 06/13/17 0516 06/14/17 0644  WBC 10.1  --  11.7*  --   --  9.7 10.6*  LATICACIDVEN  --  4.02*  --  1.8 1.8  --   --     DEATH EXAM Eyes: Pupils fixed and dilated; No blink to threat Heart/CV: No Auscultated Heart Sounds for over 1 minute; No palpable Jugular, Radial, or Pedal Pulses Lungs: No spontaneous respirations; No auscultated Breath Sounds Skin: Jaundiced appearance; Cool Extremities   Procedures/Operations  ECHOCARDIOGRAM Study Conclusions  - Left ventricle: The cavity size was normal. Wall thickness was   normal. Systolic function was normal. The estimated ejection   fraction was in the range of 55% to 60%. Wall motion was normal;   there were no regional wall motion abnormalities. Features are   consistent with a pseudonormal left ventricular filling pattern,   with concomitant abnormal relaxation and increased filling   pressure (grade 2 diastolic dysfunction). - Ventricular septum: D-shaped interventricular septum suggestive   of RV pressure/volume overload. - Aortic valve: Trileaflet; moderately calcified leaflets. There   was mild stenosis. There was trivial regurgitation. Mean gradient   (S): 9 mm Hg. - Mitral valve: Severely calcified annulus. Moderately calcified   leaflets. There is a possible vegetation (small) noted near the   tip of the posterior leaflet. There was moderate to severe   regurgitation. - Left atrium: The atrium was severely dilated. - Right ventricle: The cavity size was  moderately dilated. Pacer   wire or catheter noted in right ventricle. Systolic function was   moderately reduced. - Right atrium: The atrium was severely dilated. In one view (image   51), a small mobile mass occasionally flickers into view in the   right atrium, ?vegetation on pacemaker lead. - Tricuspid valve: There was moderate regurgitation. Peak RV-RA   gradient (S): 51 mm Hg. - Pulmonary arteries: PA peak pressure: 59 mm Hg (S). - Systemic veins: IVC measured 2.1 cm with < 50% respirophasic   variation, suggesting RA pressure 8 mmHg. - Pericardium, extracardiac: A trivial pericardial effusion was   identified.  Impressions:  - Normal LV size with EF 55-60%. Moderate diastolic dysfunction.   Moderately dilated RV with moderately decreased systolic   function. D-shaped interventricular septum suggestive of RV   pressure/volume overload. Mild aortic stenosis. Heavily calcified   mitral valve and annulus with moderate to severe mitral   regurgitation. I am concerned that there may be a small   vegetation at the tip of the posterior leaflet. Moderate   pulmonary hypertension. Severe biatrial enlargement. Moderate TR.   There is a mobile mass noted flickering in and out of view in one   image of the right atrium. Possible vegetation on pacemaker lead.   This patient needs a TEE.    [place] 2017-06-23, 11:05 AM

## 2017-07-13 NOTE — Progress Notes (Signed)
Patient's daughter Shauna Hugh called this RN to the room around 0910 to check on her mother. This RN and Leanord Hawking, RN noted patient unresponsive, no respirations and no apical pulse when auscultated for full minute. Dr. Alfredia Ferguson notified and stated he will be by at 52 to do death certificate. Patient's family at bedside, given emotional support. [facility] notified. Family taking patient belongings.

## 2017-07-13 NOTE — Consult Note (Signed)
           [facility] Primary Care Navigator  2017-07-15  [name] 12/30/1918 [id number]   [place] see patient earlier at the bedside to identify possible discharge needs but staffreports that patient expired this morning.   For questions, please contact:  Dannielle Huh, BSN, RN- [facility] Primary Care Navigator  Telephone: [phone] [name]

## 2017-07-13 DEATH — deceased

## 2017-07-31 IMAGING — DX DG CHEST 2V
2 series · 2 of 2 positions shown · non-contrast
Comparison: 12/29/2012

CLINICAL DATA: Cough for 2 days. Patient is off Coumadin for leg
hematoma.

EXAM:
CHEST  2 VIEW

[chest lat]
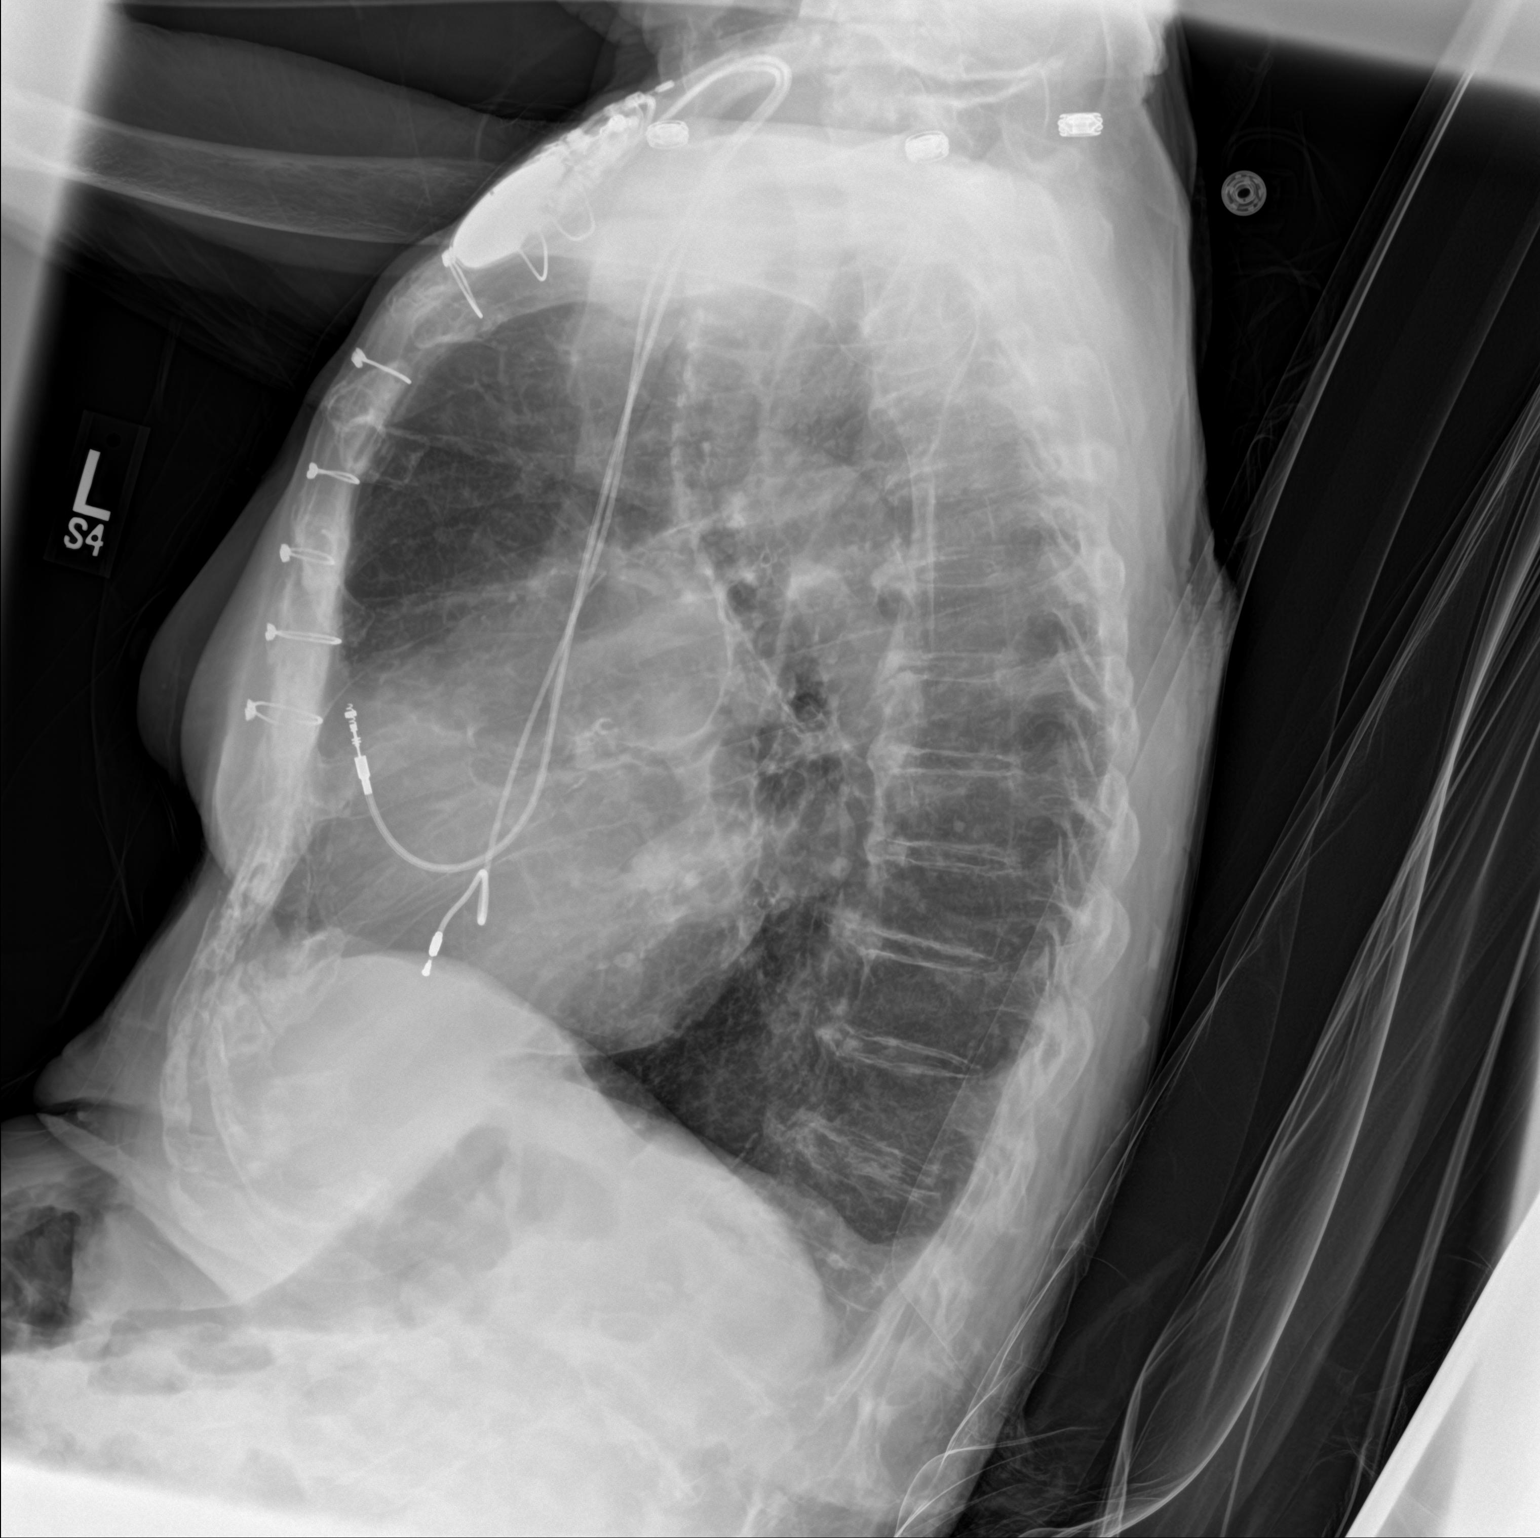

[chest ap]
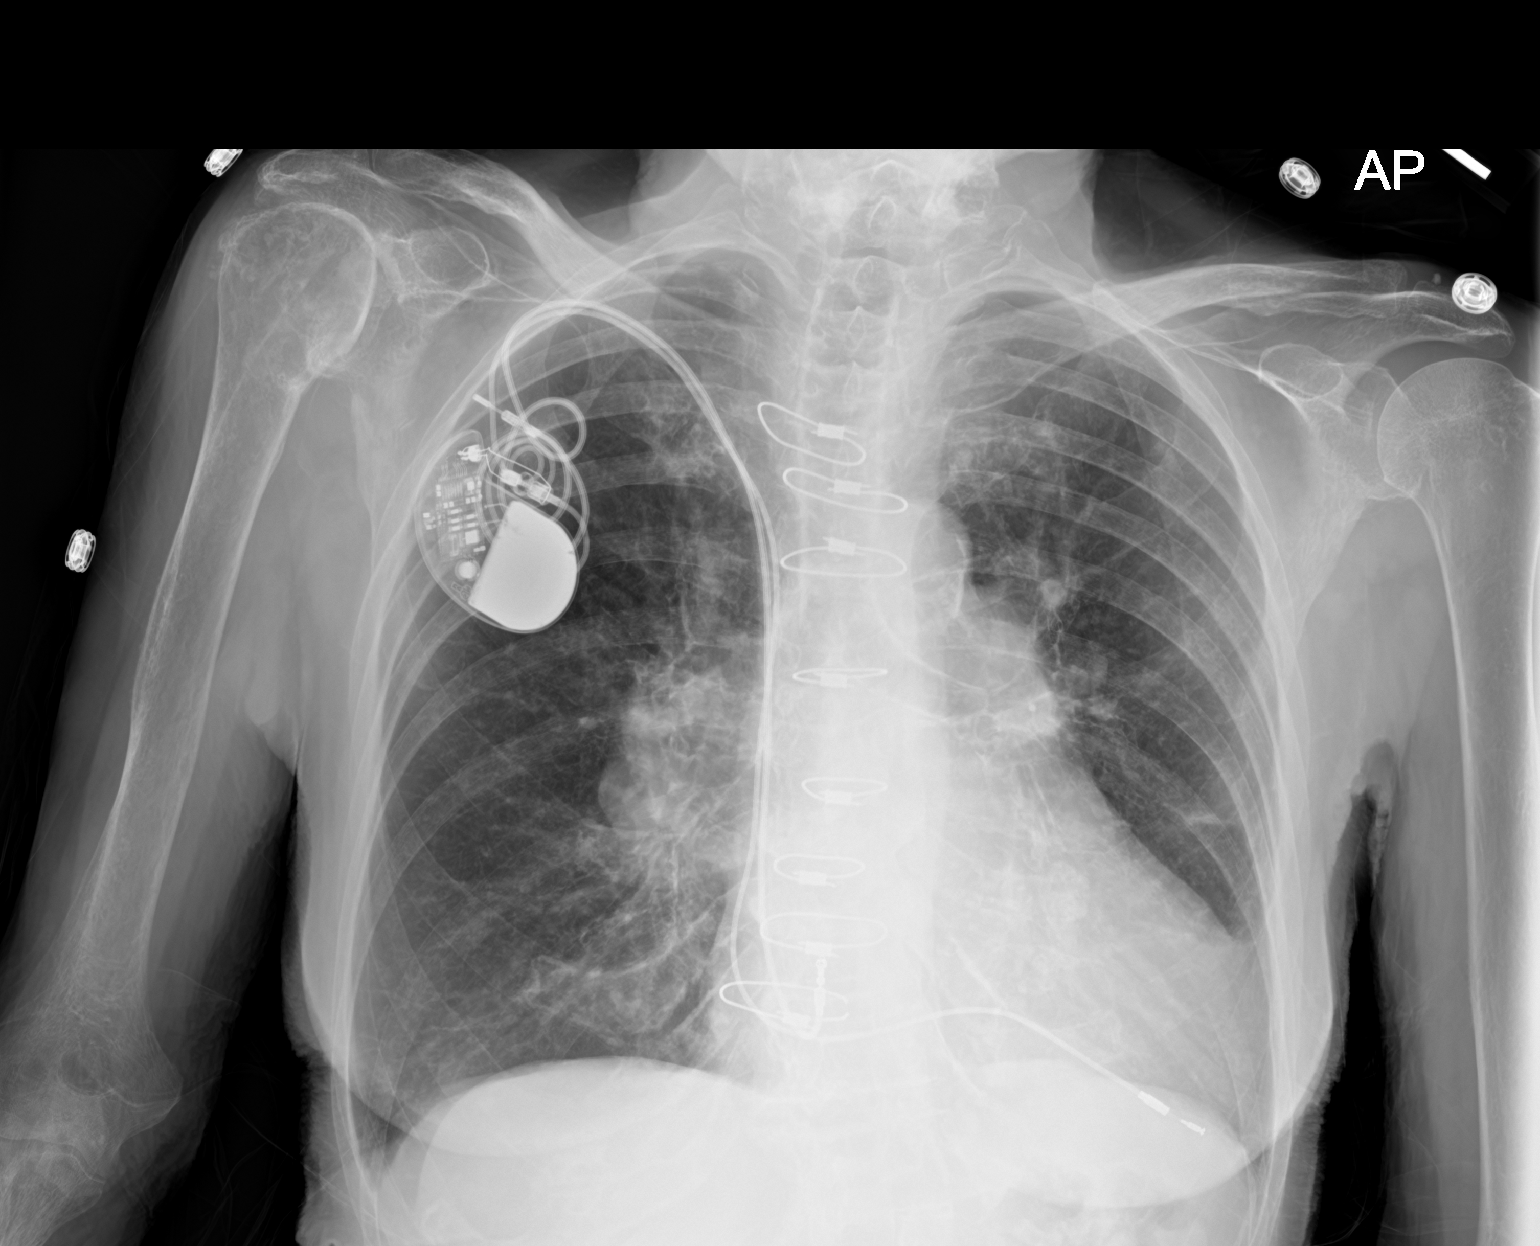

[2 of 2 positions shown; findings below may reference images not displayed]

FINDINGS: Postsurgical changes from thoracotomy are stable. Dual lead cardiac
pacemaker is in stable position.

Cardiomediastinal silhouette is stably enlarged. Mediastinal
contours appear intact. Again seen is stable prominence of bilateral
hilar regions.

There is no evidence of focal airspace consolidation, pleural
effusion or pneumothorax.

Osseous structures are without acute abnormality. Osteopenia and
osteoarthritic changes of the right shoulder noted. Soft tissues are
grossly normal.
IMPRESSION: Stable appearance of the chest with enlarged cardiac silhouette, and
prominence of the hilar regions, particularly the right hilum. If
further evaluation is desired, cross-sectional imaging may be
considered.

No evidence of focal airspace consolidation or large pleural
effusion.

## 2019-02-17 IMAGING — CT CT HEAD W/O CM
4 of 7 series · 16 of 47 positions shown, 17 images · non-contrast
Comparison: None.

CLINICAL DATA: Pt lost her balance and fell backwards. Struck her
head on the foot board of her bed. Hematoma to back of her head.
Denies LOC

EXAM:
CT HEAD WITHOUT CONTRAST
CT CERVICAL SPINE WITHOUT CONTRAST
TECHNIQUE: Multidetector CT imaging of the head and cervical spine was
performed following the standard protocol without intravenous
contrast. Multiplanar CT image reconstructions of the cervical spine
were also generated.

[Series 4: head 5.0 h30s · axial · 0.43mm/px · z∈[+1104,+1184]mm · 3 of 32 slices shown, 4 images]
[im 8/32  brain]
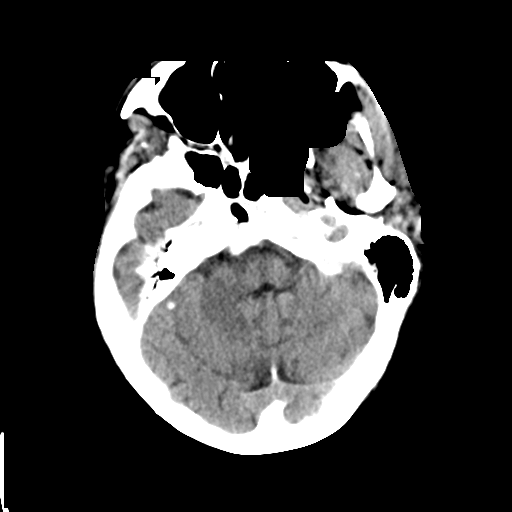
[im 8/32  bone]
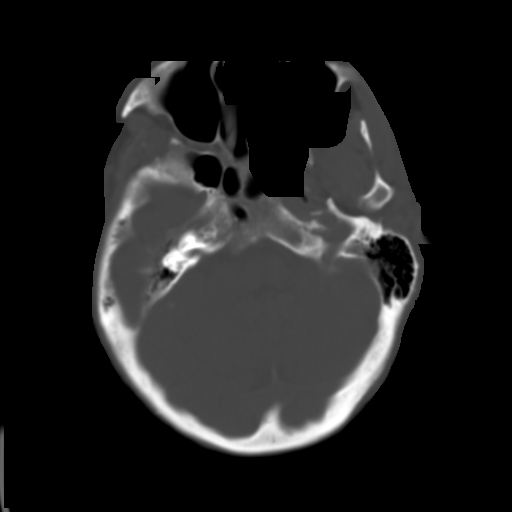
[im 16/32  brain]
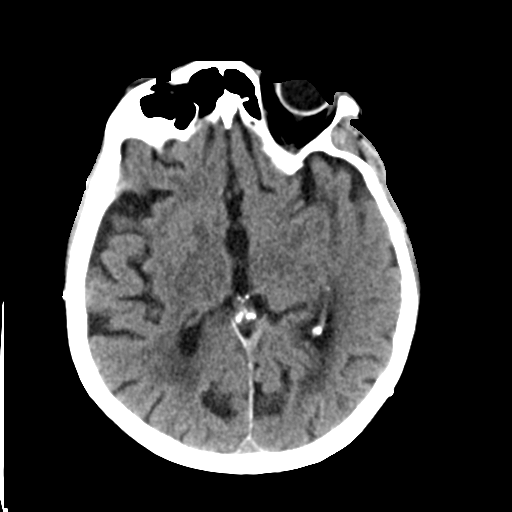
[im 24/32  brain]
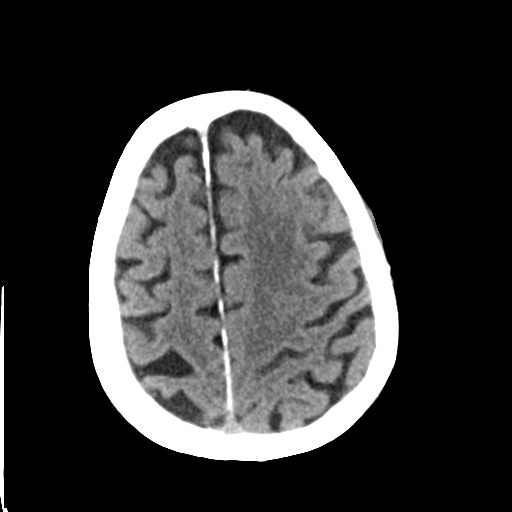

[Series 6: head 3.0 mpr cor · coronal · 0.33mm/px · 3 of 67 slices shown]
[im 10/67  brain]
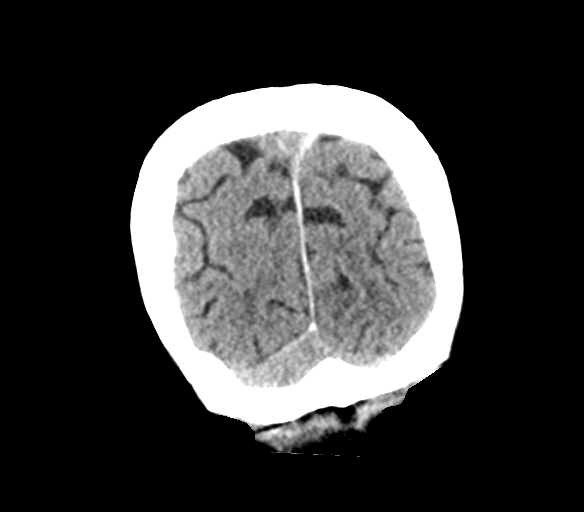
[im 19/67  brain]
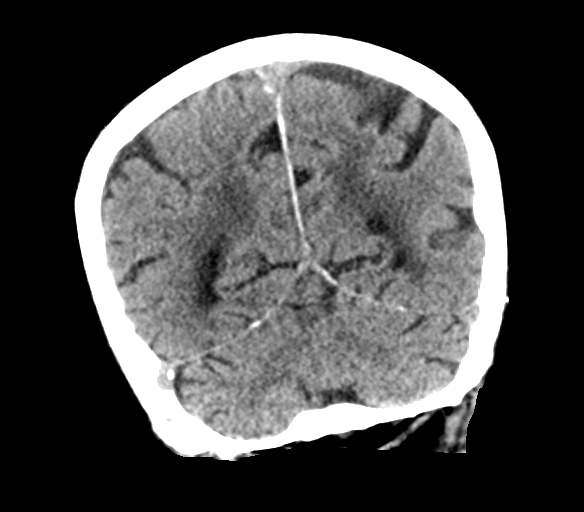
[im 29/67  brain]
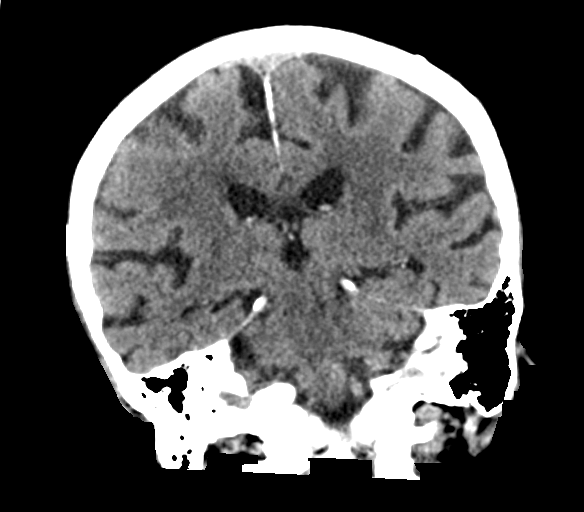

[Series 7: head 3.0 mpr sag · sagittal · 0.33mm/px · 2 of 53 slices shown]
[im 18/53  brain]
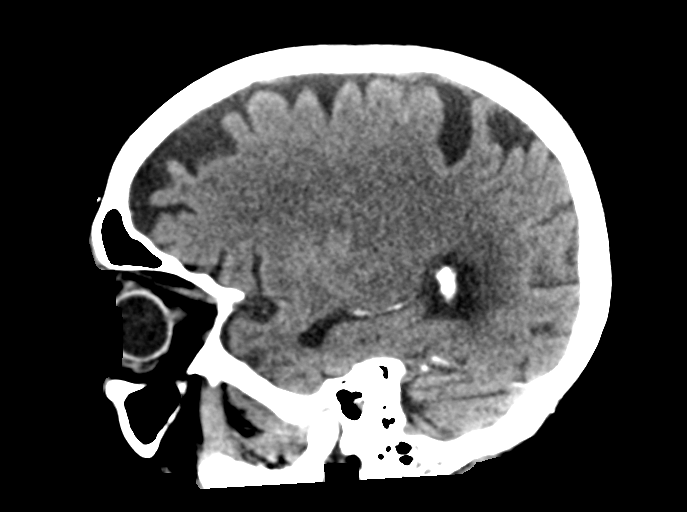
[im 35/53  brain]
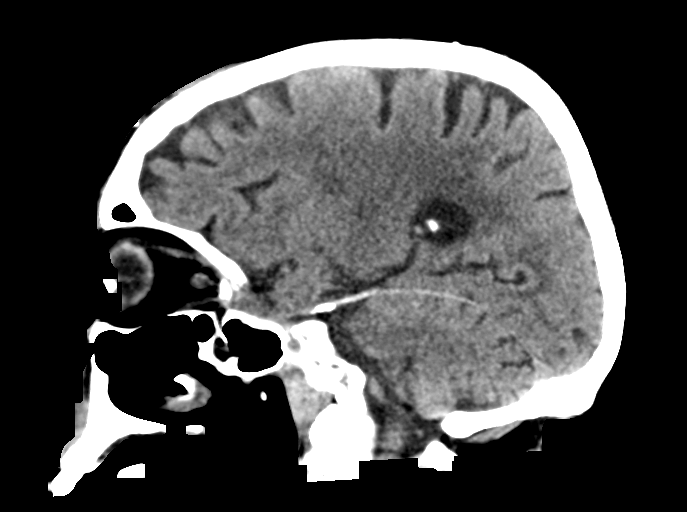

[Series 12: orthogonal axial bone · axial · 0.21mm/px · z∈[+922,+1069]mm · 8 of 97 slices shown]
[im 9/97  bone]
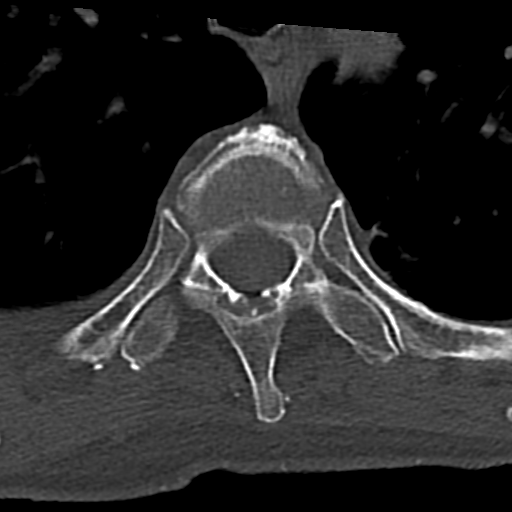
[im 25/97  bone]
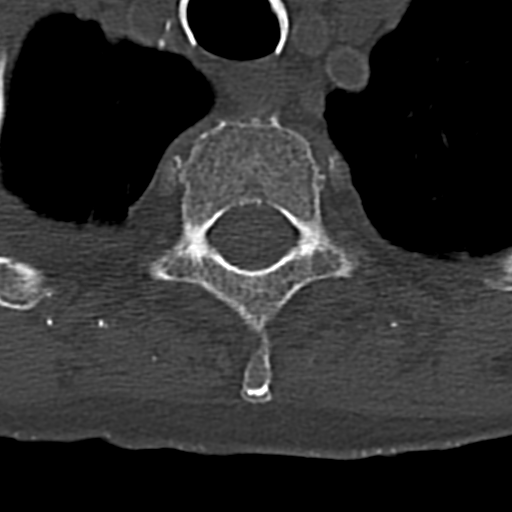
[im 33/97  bone]
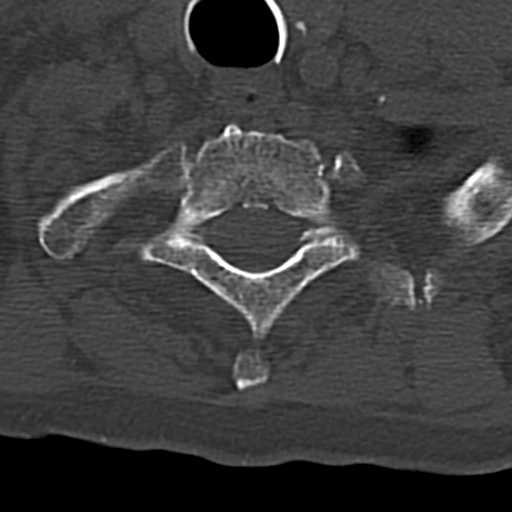
[im 41/97  bone]
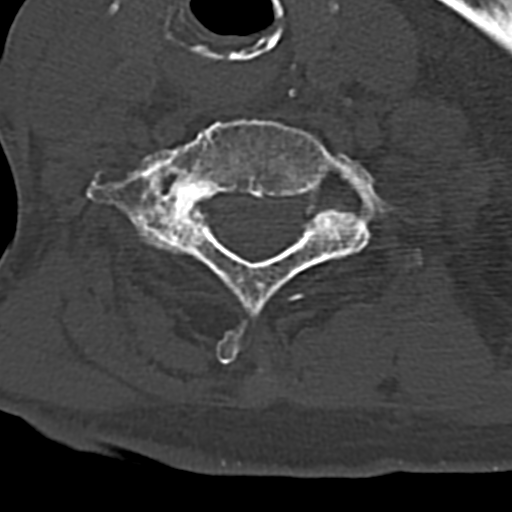
[im 57/97  bone]
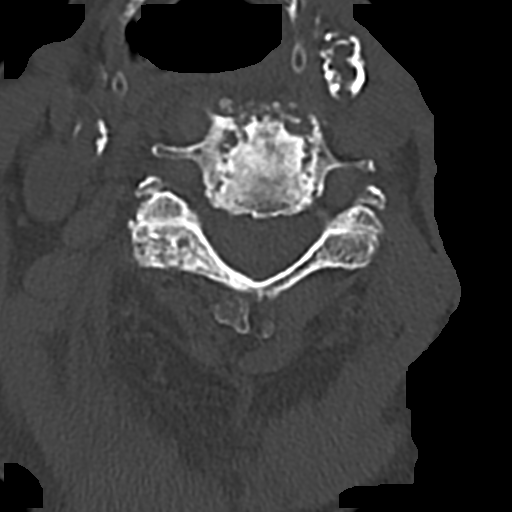
[im 65/97  bone]
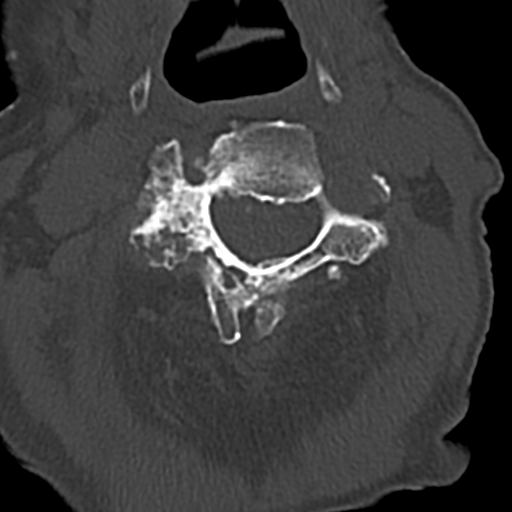
[im 73/97  bone]
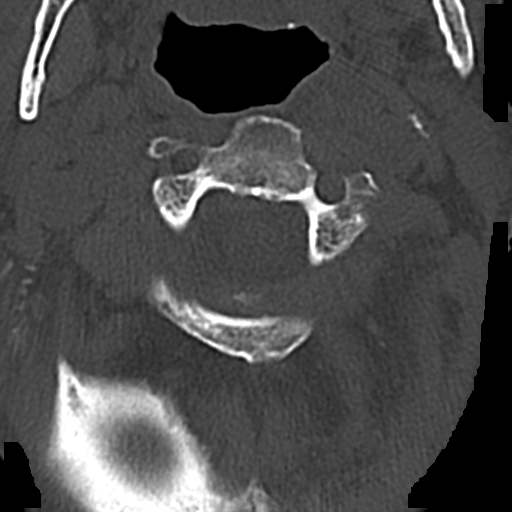
[im 89/97  bone]
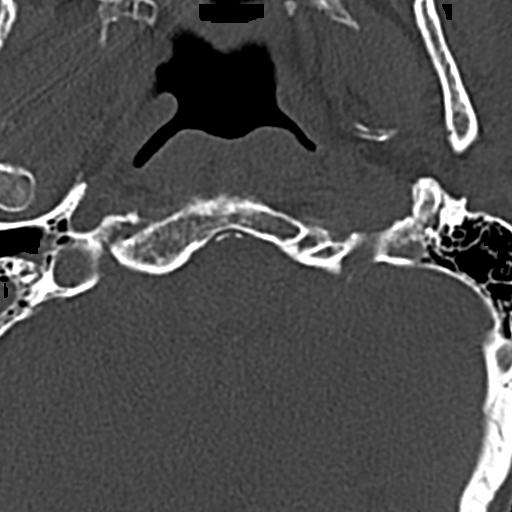

[16 of 47 positions shown; findings below may reference images not displayed]

FINDINGS: CT HEAD FINDINGS

Brain: There is significant central and cortical atrophy.
Periventricular white matter changes are consistent with small
vessel disease. There is no evidence for hemorrhage, mass lesion, or
acute infarction.

Vascular: There is atherosclerotic calcification of the carotid
siphons.

Skull: No calvarial fracture. Left frontal scalp edema without
underlying calvarial abnormality.

Sinuses/Orbits: Orbits intact. Paranasal sinuses are clear.
Significant right mastoid effusion.

Other: None

CT CERVICAL SPINE FINDINGS

Alignment: There is convex left scoliosis of the cervical spine,
associated significant degenerative changes. There is degenerative 3
mm anterolisthesis of C3 on C4, associated with large calcification
of the posterior spinal ligament, creating spinal stenosis over
approximately 7 mm at this level . There is anterolisthesis of C7 on
T1, also degenerative.

Skull base and vertebrae: No acute fracture.

Soft tissues and spinal canal: Spinal stenosis at C3-4.

Disc levels: Significant disc height loss at C3-4, C4-5, C5-6, and
C6-7.

Upper chest: A 6 mm nodule is identified at the posterior right lung
apex. Right-sided pleural effusion is also noted.

Other: Significant atherosclerotic calcification of the carotid
arteries.
IMPRESSION: 1. Atrophy and small vessel disease.
2. Left frontal scalp edema without underlying calvarial fracture.
3. Significant right mastoid effusion.
4. Chronic changes in the cervical spine including scoliosis,
spondylosis, and stenosis. Changes are particularly prominent at
C3-4 through C6-7.
5. 6 mm nodule at the posterior right lung apex possibly related to
right pleural effusion. As appropriate, Non-contrast chest CT at
6-12 months is recommended. If the nodule is stable at time of
repeat CT, then future CT at 18-24 months (from today's scan) is
considered optional for low-risk patients, but is recommended for
high-risk patients. This recommendation follows the consensus
statement: Guidelines for Management of Incidental Pulmonary Nodules
Detected on CT Images: From the [HOSPITAL] 0867; Radiology
